# Patient Record
Sex: Female | Born: 1955
Health system: Southern US, Community
[De-identification: ages and names within clinical notes are randomized; demographics above are authoritative.]

## PROBLEM LIST (undated history)

## (undated) DIAGNOSIS — E559 Vitamin D deficiency, unspecified: Secondary | ICD-10-CM

## (undated) DIAGNOSIS — Z9359 Other cystostomy status: Secondary | ICD-10-CM

## (undated) DIAGNOSIS — S82209A Unspecified fracture of shaft of unspecified tibia, initial encounter for closed fracture: Secondary | ICD-10-CM

## (undated) DIAGNOSIS — N319 Neuromuscular dysfunction of bladder, unspecified: Secondary | ICD-10-CM

## (undated) DIAGNOSIS — H269 Unspecified cataract: Secondary | ICD-10-CM

## (undated) DIAGNOSIS — N6019 Diffuse cystic mastopathy of unspecified breast: Secondary | ICD-10-CM

## (undated) DIAGNOSIS — M810 Age-related osteoporosis without current pathological fracture: Secondary | ICD-10-CM

## (undated) DIAGNOSIS — E785 Hyperlipidemia, unspecified: Secondary | ICD-10-CM

## (undated) DIAGNOSIS — I82409 Acute embolism and thrombosis of unspecified deep veins of unspecified lower extremity: Secondary | ICD-10-CM

## (undated) DIAGNOSIS — S82409A Unspecified fracture of shaft of unspecified fibula, initial encounter for closed fracture: Secondary | ICD-10-CM

## (undated) DIAGNOSIS — G8250 Quadriplegia, unspecified: Secondary | ICD-10-CM

## (undated) DIAGNOSIS — L89159 Pressure ulcer of sacral region, unspecified stage: Secondary | ICD-10-CM

## (undated) DIAGNOSIS — Z8619 Personal history of other infectious and parasitic diseases: Secondary | ICD-10-CM

## (undated) DIAGNOSIS — L89509 Pressure ulcer of unspecified ankle, unspecified stage: Secondary | ICD-10-CM

## (undated) HISTORY — PX: EYE MUSCLE SURGERY: SHX370

## (undated) HISTORY — DX: Pressure ulcer of sacral region, unspecified stage: L89.159

## (undated) HISTORY — DX: Neuromuscular dysfunction of bladder, unspecified: N31.9

## (undated) HISTORY — DX: Quadriplegia, unspecified: G82.50

## (undated) HISTORY — DX: Personal history of other infectious and parasitic diseases: Z86.19

## (undated) HISTORY — DX: Unspecified fracture of shaft of unspecified fibula, initial encounter for closed fracture: S82.409A

## (undated) HISTORY — DX: Diffuse cystic mastopathy of unspecified breast: N60.19

## (undated) HISTORY — PX: DILATION AND CURETTAGE OF UTERUS: SHX78

## (undated) HISTORY — DX: Age-related osteoporosis without current pathological fracture: M81.0

## (undated) HISTORY — DX: Hyperlipidemia, unspecified: E78.5

## (undated) HISTORY — DX: Acute embolism and thrombosis of unspecified deep veins of unspecified lower extremity: I82.409

## (undated) HISTORY — DX: Unspecified fracture of shaft of unspecified tibia, initial encounter for closed fracture: S82.209A

## (undated) HISTORY — DX: Pressure ulcer of unspecified ankle, unspecified stage: L89.509

## (undated) HISTORY — DX: Unspecified cataract: H26.9

## (undated) HISTORY — DX: Vitamin D deficiency, unspecified: E55.9

---

## 1979-08-05 HISTORY — PX: MULTIPLE TOOTH EXTRACTIONS: SHX2053

## 1989-08-04 DIAGNOSIS — L89159 Pressure ulcer of sacral region, unspecified stage: Secondary | ICD-10-CM

## 1989-08-04 HISTORY — DX: Pressure ulcer of sacral region, unspecified stage: L89.159

## 1991-12-05 DIAGNOSIS — G8250 Quadriplegia, unspecified: Secondary | ICD-10-CM

## 1991-12-05 HISTORY — DX: Quadriplegia, unspecified: G82.50

## 1991-12-21 HISTORY — PX: POSTERIOR CERVICAL FUSION/FORAMINOTOMY: SHX5038

## 1992-12-04 DIAGNOSIS — I82409 Acute embolism and thrombosis of unspecified deep veins of unspecified lower extremity: Secondary | ICD-10-CM

## 1992-12-04 HISTORY — DX: Acute embolism and thrombosis of unspecified deep veins of unspecified lower extremity: I82.409

## 1995-12-05 DIAGNOSIS — N319 Neuromuscular dysfunction of bladder, unspecified: Secondary | ICD-10-CM

## 1995-12-05 HISTORY — DX: Neuromuscular dysfunction of bladder, unspecified: N31.9

## 2004-04-15 ENCOUNTER — Inpatient Hospital Stay (HOSPITAL_COMMUNITY): Admission: EM | Admit: 2004-04-15 | Discharge: 2004-04-18 | Payer: Self-pay | Admitting: Emergency Medicine

## 2006-04-25 ENCOUNTER — Inpatient Hospital Stay (HOSPITAL_COMMUNITY): Admission: EM | Admit: 2006-04-25 | Discharge: 2006-05-08 | Payer: Self-pay | Admitting: Emergency Medicine

## 2006-04-25 ENCOUNTER — Ambulatory Visit: Payer: Self-pay | Admitting: Internal Medicine

## 2006-04-29 ENCOUNTER — Encounter (INDEPENDENT_AMBULATORY_CARE_PROVIDER_SITE_OTHER): Payer: Self-pay | Admitting: *Deleted

## 2006-12-04 HISTORY — PX: DILITATION & CURRETTAGE/HYSTROSCOPY WITH NOVASURE ABLATION: SHX5568

## 2006-12-04 LAB — HM PAP SMEAR: HM Pap smear: NORMAL

## 2010-05-06 ENCOUNTER — Encounter (HOSPITAL_BASED_OUTPATIENT_CLINIC_OR_DEPARTMENT_OTHER): Admission: RE | Admit: 2010-05-06 | Discharge: 2010-08-04 | Payer: Self-pay | Admitting: Internal Medicine

## 2010-08-04 DIAGNOSIS — S82209A Unspecified fracture of shaft of unspecified tibia, initial encounter for closed fracture: Secondary | ICD-10-CM

## 2010-08-04 HISTORY — DX: Unspecified fracture of shaft of unspecified tibia, initial encounter for closed fracture: S82.209A

## 2010-08-04 HISTORY — PX: TIBIA FRACTURE SURGERY: SHX806

## 2010-08-09 ENCOUNTER — Encounter (HOSPITAL_BASED_OUTPATIENT_CLINIC_OR_DEPARTMENT_OTHER)
Admission: RE | Admit: 2010-08-09 | Discharge: 2010-11-07 | Payer: Self-pay | Source: Home / Self Care | Admitting: Internal Medicine

## 2010-08-29 ENCOUNTER — Emergency Department (HOSPITAL_COMMUNITY)
Admission: EM | Admit: 2010-08-29 | Discharge: 2010-08-29 | Payer: Self-pay | Source: Home / Self Care | Admitting: Emergency Medicine

## 2010-09-16 ENCOUNTER — Inpatient Hospital Stay (HOSPITAL_COMMUNITY): Admission: RE | Admit: 2010-09-16 | Discharge: 2010-09-18 | Payer: Self-pay | Admitting: Orthopedic Surgery

## 2010-11-10 ENCOUNTER — Encounter (HOSPITAL_BASED_OUTPATIENT_CLINIC_OR_DEPARTMENT_OTHER)
Admission: RE | Admit: 2010-11-10 | Discharge: 2010-12-07 | Payer: Self-pay | Source: Home / Self Care | Attending: Internal Medicine | Admitting: Internal Medicine

## 2010-12-04 LAB — HM DIABETES EYE EXAM

## 2010-12-08 ENCOUNTER — Encounter (HOSPITAL_BASED_OUTPATIENT_CLINIC_OR_DEPARTMENT_OTHER)
Admission: RE | Admit: 2010-12-08 | Discharge: 2011-01-03 | Payer: Self-pay | Source: Home / Self Care | Attending: Internal Medicine | Admitting: Internal Medicine

## 2010-12-08 ENCOUNTER — Emergency Department (HOSPITAL_COMMUNITY)
Admission: EM | Admit: 2010-12-08 | Discharge: 2010-12-08 | Payer: Self-pay | Source: Home / Self Care | Admitting: Emergency Medicine

## 2010-12-08 LAB — DIFFERENTIAL
Basophils Absolute: 0 10*3/uL (ref 0.0–0.1)
Basophils Relative: 1 % (ref 0–1)
Eosinophils Absolute: 0.1 10*3/uL (ref 0.0–0.7)
Eosinophils Relative: 1 % (ref 0–5)
Lymphocytes Relative: 45 % (ref 12–46)
Lymphs Abs: 2.6 10*3/uL (ref 0.7–4.0)
Monocytes Absolute: 0.6 10*3/uL (ref 0.1–1.0)
Monocytes Relative: 10 % (ref 3–12)
Neutro Abs: 2.5 10*3/uL (ref 1.7–7.7)
Neutrophils Relative %: 44 % (ref 43–77)

## 2010-12-08 LAB — PROTIME-INR
INR: 7.57 (ref 0.00–1.49)
Prothrombin Time: 63.6 seconds — ABNORMAL HIGH (ref 11.6–15.2)

## 2010-12-08 LAB — CBC
HCT: 38.8 % (ref 36.0–46.0)
Hemoglobin: 12.9 g/dL (ref 12.0–15.0)
MCH: 30.9 pg (ref 26.0–34.0)
MCHC: 33.2 g/dL (ref 30.0–36.0)
MCV: 93 fL (ref 78.0–100.0)
Platelets: 182 10*3/uL (ref 150–400)
RBC: 4.17 MIL/uL (ref 3.87–5.11)
RDW: 13.3 % (ref 11.5–15.5)
WBC: 5.8 10*3/uL (ref 4.0–10.5)

## 2011-01-04 DIAGNOSIS — L89509 Pressure ulcer of unspecified ankle, unspecified stage: Secondary | ICD-10-CM

## 2011-01-04 HISTORY — DX: Pressure ulcer of unspecified ankle, unspecified stage: L89.509

## 2011-01-05 ENCOUNTER — Encounter (HOSPITAL_BASED_OUTPATIENT_CLINIC_OR_DEPARTMENT_OTHER): Payer: Medicare Other | Attending: Internal Medicine

## 2011-01-05 DIAGNOSIS — L8993 Pressure ulcer of unspecified site, stage 3: Secondary | ICD-10-CM | POA: Insufficient documentation

## 2011-01-05 DIAGNOSIS — L02619 Cutaneous abscess of unspecified foot: Secondary | ICD-10-CM | POA: Insufficient documentation

## 2011-01-05 DIAGNOSIS — L89609 Pressure ulcer of unspecified heel, unspecified stage: Secondary | ICD-10-CM | POA: Insufficient documentation

## 2011-01-12 ENCOUNTER — Emergency Department (HOSPITAL_COMMUNITY): Payer: Medicare Other

## 2011-01-12 ENCOUNTER — Inpatient Hospital Stay (HOSPITAL_COMMUNITY)
Admission: EM | Admit: 2011-01-12 | Discharge: 2011-01-15 | DRG: 602 | Disposition: A | Discharge status: Home Health | Payer: Medicare Other | Attending: Family Medicine | Admitting: Family Medicine

## 2011-01-12 DIAGNOSIS — I82509 Chronic embolism and thrombosis of unspecified deep veins of unspecified lower extremity: Secondary | ICD-10-CM | POA: Diagnosis present

## 2011-01-12 DIAGNOSIS — L8994 Pressure ulcer of unspecified site, stage 4: Secondary | ICD-10-CM | POA: Diagnosis present

## 2011-01-12 DIAGNOSIS — G8220 Paraplegia, unspecified: Secondary | ICD-10-CM | POA: Diagnosis present

## 2011-01-12 DIAGNOSIS — L89609 Pressure ulcer of unspecified heel, unspecified stage: Secondary | ICD-10-CM | POA: Diagnosis present

## 2011-01-12 DIAGNOSIS — Z87440 Personal history of urinary (tract) infections: Secondary | ICD-10-CM

## 2011-01-12 DIAGNOSIS — R031 Nonspecific low blood-pressure reading: Secondary | ICD-10-CM | POA: Diagnosis present

## 2011-01-12 DIAGNOSIS — D638 Anemia in other chronic diseases classified elsewhere: Secondary | ICD-10-CM | POA: Diagnosis present

## 2011-01-12 DIAGNOSIS — N6019 Diffuse cystic mastopathy of unspecified breast: Secondary | ICD-10-CM | POA: Diagnosis present

## 2011-01-12 DIAGNOSIS — L02619 Cutaneous abscess of unspecified foot: Principal | ICD-10-CM | POA: Diagnosis present

## 2011-01-12 DIAGNOSIS — K5909 Other constipation: Secondary | ICD-10-CM | POA: Diagnosis present

## 2011-01-12 DIAGNOSIS — Z7901 Long term (current) use of anticoagulants: Secondary | ICD-10-CM

## 2011-01-12 DIAGNOSIS — Z9359 Other cystostomy status: Secondary | ICD-10-CM

## 2011-01-12 LAB — DIFFERENTIAL
Basophils Absolute: 0 10*3/uL (ref 0.0–0.1)
Basophils Relative: 0 % (ref 0–1)
Eosinophils Absolute: 0 10*3/uL (ref 0.0–0.7)
Eosinophils Relative: 0 % (ref 0–5)
Lymphocytes Relative: 23 % (ref 12–46)
Lymphs Abs: 2.2 10*3/uL (ref 0.7–4.0)
Monocytes Absolute: 1.1 10*3/uL — ABNORMAL HIGH (ref 0.1–1.0)
Monocytes Relative: 12 % (ref 3–12)
Neutro Abs: 6.2 10*3/uL (ref 1.7–7.7)
Neutrophils Relative %: 65 % (ref 43–77)

## 2011-01-12 LAB — BASIC METABOLIC PANEL
BUN: 9 mg/dL (ref 6–23)
CO2: 26 mEq/L (ref 19–32)
Calcium: 8.9 mg/dL (ref 8.4–10.5)
Chloride: 98 mEq/L (ref 96–112)
Creatinine, Ser: 0.4 mg/dL (ref 0.4–1.2)
GFR calc Af Amer: >60 mL/min (ref >60)
GFR calc non Af Amer: >60 mL/min (ref >60)
Glucose, Bld: 96 mg/dL (ref 70–99)
Potassium: 4 mEq/L (ref 3.5–5.1)
Sodium: 135 mEq/L (ref 135–145)

## 2011-01-12 LAB — CBC
HCT: 33.6 % — ABNORMAL LOW (ref 36.0–46.0)
Hemoglobin: 11.3 g/dL — ABNORMAL LOW (ref 12.0–15.0)
MCH: 30.6 pg (ref 26.0–34.0)
MCHC: 33.6 g/dL (ref 30.0–36.0)
MCV: 91.1 fL (ref 78.0–100.0)
Platelets: 184 10*3/uL (ref 150–400)
RBC: 3.69 MIL/uL — ABNORMAL LOW (ref 3.87–5.11)
RDW: 13.6 % (ref 11.5–15.5)
WBC: 9.6 10*3/uL (ref 4.0–10.5)

## 2011-01-12 LAB — URINALYSIS, ROUTINE W REFLEX MICROSCOPIC
Bilirubin Urine: NEGATIVE
Hgb urine dipstick: NEGATIVE
Ketones, ur: NEGATIVE mg/dL
Nitrite: NEGATIVE
Protein, ur: NEGATIVE mg/dL
Specific Gravity, Urine: 1.023 (ref 1.005–1.030)
Urine Glucose, Fasting: NEGATIVE mg/dL
Urobilinogen, UA: 1 mg/dL (ref 0.0–1.0)
pH: 6.5 (ref 5.0–8.0)

## 2011-01-12 LAB — PROTIME-INR
INR: 2.02 — ABNORMAL HIGH (ref 0.00–1.49)
Prothrombin Time: 23 seconds — ABNORMAL HIGH (ref 11.6–15.2)

## 2011-01-12 LAB — URINE MICROSCOPIC-ADD ON

## 2011-01-12 LAB — LACTIC ACID, PLASMA: Lactic Acid, Venous: 0.7 mmol/L (ref 0.5–2.2)

## 2011-01-13 LAB — PROTIME-INR
INR: 1.97 — ABNORMAL HIGH (ref 0.00–1.49)
Prothrombin Time: 22.6 seconds — ABNORMAL HIGH (ref 11.6–15.2)

## 2011-01-14 LAB — URINE CULTURE
Colony Count: NO GROWTH
Culture  Setup Time: 201202100330
Culture: NO GROWTH
Special Requests: NEGATIVE

## 2011-01-14 LAB — PROTIME-INR
INR: 1.68 — ABNORMAL HIGH (ref 0.00–1.49)
Prothrombin Time: 20 seconds — ABNORMAL HIGH (ref 11.6–15.2)

## 2011-01-14 LAB — CBC
HCT: 35.3 % — ABNORMAL LOW (ref 36.0–46.0)
MCHC: 32.3 g/dL (ref 30.0–36.0)
Platelets: 217 10*3/uL (ref 150–400)
RDW: 13.9 % (ref 11.5–15.5)
WBC: 5.7 10*3/uL (ref 4.0–10.5)

## 2011-01-14 LAB — VANCOMYCIN, TROUGH: Vancomycin Tr: 21.6 ug/mL — ABNORMAL HIGH (ref 10.0–20.0)

## 2011-01-15 LAB — CULTURE, BLOOD (ROUTINE X 2): Culture  Setup Time: 201202092152

## 2011-01-15 LAB — CBC
MCH: 30.5 pg (ref 26.0–34.0)
MCHC: 33.4 g/dL (ref 30.0–36.0)
Platelets: 192 10*3/uL (ref 150–400)
RDW: 13.8 % (ref 11.5–15.5)

## 2011-01-15 LAB — PROTIME-INR
INR: 1.89 — ABNORMAL HIGH (ref 0.00–1.49)
Prothrombin Time: 21.9 seconds — ABNORMAL HIGH (ref 11.6–15.2)
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
## 2011-01-16 LAB — WOUND CULTURE

## 2011-01-19 LAB — CULTURE, BLOOD (ROUTINE X 2): Culture  Setup Time: 201202092151
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
## 2011-02-02 ENCOUNTER — Encounter (HOSPITAL_BASED_OUTPATIENT_CLINIC_OR_DEPARTMENT_OTHER): Payer: Medicare Other | Attending: Internal Medicine

## 2011-02-02 DIAGNOSIS — L03119 Cellulitis of unspecified part of limb: Secondary | ICD-10-CM | POA: Insufficient documentation

## 2011-02-02 DIAGNOSIS — L02619 Cutaneous abscess of unspecified foot: Secondary | ICD-10-CM | POA: Insufficient documentation

## 2011-02-02 DIAGNOSIS — L8993 Pressure ulcer of unspecified site, stage 3: Secondary | ICD-10-CM | POA: Insufficient documentation

## 2011-02-02 DIAGNOSIS — L89609 Pressure ulcer of unspecified heel, unspecified stage: Secondary | ICD-10-CM | POA: Insufficient documentation

## 2011-02-15 LAB — BASIC METABOLIC PANEL
BUN: 4 mg/dL — ABNORMAL LOW (ref 6–23)
BUN: 5 mg/dL — ABNORMAL LOW (ref 6–23)
CO2: 26 mEq/L (ref 19–32)
Calcium: 7.9 mg/dL — ABNORMAL LOW (ref 8.4–10.5)
Chloride: 100 mEq/L (ref 96–112)
Creatinine, Ser: 0.32 mg/dL — ABNORMAL LOW (ref 0.4–1.2)
Creatinine, Ser: 0.45 mg/dL (ref 0.4–1.2)
GFR calc Af Amer: >60 mL/min (ref >60)
GFR calc non Af Amer: >60 mL/min (ref >60)
Glucose, Bld: 102 mg/dL — ABNORMAL HIGH (ref 70–99)

## 2011-02-15 LAB — CBC
MCH: 30.5 pg (ref 26.0–34.0)
MCHC: 32.6 g/dL (ref 30.0–36.0)
MCV: 93.8 fL (ref 78.0–100.0)
Platelets: 181 10*3/uL (ref 150–400)
Platelets: 222 10*3/uL (ref 150–400)
RBC: 3.25 MIL/uL — ABNORMAL LOW (ref 3.87–5.11)
RDW: 14.1 % (ref 11.5–15.5)
RDW: 14.2 % (ref 11.5–15.5)
WBC: 6.8 10*3/uL (ref 4.0–10.5)

## 2011-02-15 LAB — PROTIME-INR
INR: 1.89 — ABNORMAL HIGH (ref 0.00–1.49)
Prothrombin Time: 26.3 seconds — ABNORMAL HIGH (ref 11.6–15.2)

## 2011-02-16 LAB — CBC
HCT: 36.9 % (ref 36.0–46.0)
Hemoglobin: 12.4 g/dL (ref 12.0–15.0)
MCH: 31.3 pg (ref 26.0–34.0)
MCHC: 33.6 g/dL (ref 30.0–36.0)
MCV: 93.2 fL (ref 78.0–100.0)
RDW: 13.6 % (ref 11.5–15.5)

## 2011-02-16 LAB — PROTIME-INR
INR: 1.69 — ABNORMAL HIGH (ref 0.00–1.49)
INR: 2.18 — ABNORMAL HIGH (ref 0.00–1.49)

## 2011-02-16 LAB — BASIC METABOLIC PANEL
BUN: 9 mg/dL (ref 6–23)
CO2: 28 mEq/L (ref 19–32)
Chloride: 102 mEq/L (ref 96–112)
Glucose, Bld: 93 mg/dL (ref 70–99)
Potassium: 3.4 mEq/L — ABNORMAL LOW (ref 3.5–5.1)
Sodium: 139 mEq/L (ref 135–145)

## 2011-02-16 LAB — SURGICAL PCR SCREEN: Staphylococcus aureus: NEGATIVE

## 2011-03-09 ENCOUNTER — Encounter (HOSPITAL_BASED_OUTPATIENT_CLINIC_OR_DEPARTMENT_OTHER): Payer: Medicare Other | Attending: Internal Medicine

## 2011-03-09 DIAGNOSIS — L02619 Cutaneous abscess of unspecified foot: Secondary | ICD-10-CM | POA: Insufficient documentation

## 2011-03-09 DIAGNOSIS — L8993 Pressure ulcer of unspecified site, stage 3: Secondary | ICD-10-CM | POA: Insufficient documentation

## 2011-03-09 DIAGNOSIS — L03119 Cellulitis of unspecified part of limb: Secondary | ICD-10-CM | POA: Insufficient documentation

## 2011-03-09 DIAGNOSIS — L89609 Pressure ulcer of unspecified heel, unspecified stage: Secondary | ICD-10-CM | POA: Insufficient documentation

## 2011-04-06 ENCOUNTER — Encounter (HOSPITAL_BASED_OUTPATIENT_CLINIC_OR_DEPARTMENT_OTHER): Payer: Medicare Other | Attending: Internal Medicine

## 2011-04-06 DIAGNOSIS — L8992 Pressure ulcer of unspecified site, stage 2: Secondary | ICD-10-CM | POA: Insufficient documentation

## 2011-04-06 DIAGNOSIS — L89609 Pressure ulcer of unspecified heel, unspecified stage: Secondary | ICD-10-CM | POA: Insufficient documentation
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
## 2011-04-21 NOTE — Discharge Summary (Signed)
[name], [name]                           ACCOUNT NO.:  [id number]   MEDICAL RECORD NO.:  [id number]                   PATIENT TYPE:  INP   LOCATION:  0452                                 FACILITY:  [facility]   PHYSICIAN:  Renato Battles, M.D.                  DATE OF BIRTH:  January 01, 1956   DATE OF ADMISSION:  04/15/2004  DATE OF DISCHARGE:  04/18/2004                                 DISCHARGE SUMMARY   DISCHARGE DIAGNOSES:  1. Right lower lobe community-acquired pneumonia.  2. History of deep venous thrombosis without pulmonary embolus for which she     is on chronic anticoagulation with Coumadin.  3. Indwelling suprapubic catheter.  4. Elevated ALT with negative viral hepatitis.  5. Severe constipation.  6. Hypoalbuminemia.  7. Hypokalemia that resolved.   DISCHARGE MEDICATIONS:  The patient came into the hospital with a list of  medications that include 17 different drugs. She is to go home and resume  her home medications in addition to the following:  1. Augmentin 875 mg p.o. b.i.d. x10 days.  2. Robitussin LA 600 mg p.o. b.i.d. x10 days.  3. Resume Coumadin at 2 mg p.o. daily instead of the old dose.   PROCEDURE:  None.   CONSULTATIONS:  None.   HISTORY, PHYSICAL, AND HOSPITAL COURSE:  The patient is a very pleasant, 55-  year-old white female who is quadriplegic. She started to develop  progressive shortness of breath for the first half of this month which  preceded with a dry cough and feeling sick overall. Review of Systems  revealed decreased oral intake. Physical exam:  She had decreased breath  sounds and some right-sided crackles. She was afebrile. Chest x-ray revealed  right lower lobe pneumonia, otherwise her white count was elevated at 21.3.  Her liver functions showed elevated ALT at 53 and potassium was low at 3.1,  otherwise her studies were within normal limits, except for elevated INR at  22.2. The patient was admitted for treatment of community-acquired  pneumonia. She responded well to intravenous treatment with Rocephin and  azithromycin. Her low potassium corrected spontaneously. We had reversed the  coagulopathy with two units of FFP; however, the patient also received 10 mg  of vitamin K in the emergency room prior to my arrival.  INR came down to  normal at 1.2 and we had to restart the patient on Lovenox and reload her  with Coumadin. Her breathing improved significantly. She was able to clear  her chest with a few doses of Robitussin and some nebulizer treatments. Her  white count came down nicely to 14.2 on the day of discharge. Viral  hepatitis panel came back negative excluding hepatitis B and hepatitis C as  the reason for her elevated ALT which should be followed up as an  outpatient. We felt the patient was very stable to be discharged. Her  hospitalization was  uneventful  with no complications. UA was checked for any signs of UTI and  they came to be clear. Incidentally, we had not discussed her albumin level  which was low and the dietary counseling was performed.   FOLLOWUP:  The patient is to followup with her primary care physician, Dr.  Smith Mince as an outpatient.                                               Renato Battles, M.D.    SA/MEDQ  D:  04/18/2004  T:  04/19/2004  Job:  [id number]   cc:   Talmadge Coventry, M.D.  [address]  [place]  [place] [address]  Fax: [phone]
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
## 2011-04-21 NOTE — Op Note (Signed)
[name], [name]               ACCOUNT NO.:  [id number]   MEDICAL RECORD NO.:  [id number]          PATIENT TYPE:  INP   LOCATION:  5024                         FACILITY:  [facility]   PHYSICIAN:  Lebron Conners, M.D.   DATE OF BIRTH:  01/15/1956   DATE OF PROCEDURE:  05/03/2006  DATE OF DISCHARGE:                                 OPERATIVE REPORT   PREOPERATIVE DIAGNOSIS:  Pressure sore of the sacral area.   POSTOPERATIVE DIAGNOSIS:  Pressure sore of the sacral area.   OPERATION PERFORMED:  Debridement of pressure sore.   SURGEON:  Lebron Conners, M.D.   ANESTHESIA:  General.   COMPLICATIONS:  None.   ESTIMATED BLOOD LOSS:  Minimal.   CONDITION TO PACU:  Good.   SPECIMENS:  None.   DESCRIPTION OF PROCEDURE:  After the patient was monitored and had general  anesthesia, we turned her on her side with the left side down and I prepped  and draped the area of the pressure sore.  There was a little bit of dead  skin which I removed, cauterized.  There was rather extensive dead  subcutaneous tissue which removed as well.  I remove a little bit of  necrotic muscle fascia near the area of the coccyx and then I got good  hemostasis with cautery.  I packed the deepest areas of the wound with  Surgicel and then a bulky compressive most bandage.  She tolerated the  operation well.      Lebron Conners, M.D.  Electronically Signed     WB/MEDQ  D:  05/03/2006  T:  05/04/2006  Job:  [id number]

## 2011-05-11 ENCOUNTER — Encounter (HOSPITAL_BASED_OUTPATIENT_CLINIC_OR_DEPARTMENT_OTHER): Payer: Medicare Other | Attending: Internal Medicine

## 2011-05-11 DIAGNOSIS — L8992 Pressure ulcer of unspecified site, stage 2: Secondary | ICD-10-CM | POA: Insufficient documentation

## 2011-05-11 DIAGNOSIS — L89609 Pressure ulcer of unspecified heel, unspecified stage: Secondary | ICD-10-CM | POA: Insufficient documentation

## 2011-06-08 ENCOUNTER — Encounter (HOSPITAL_BASED_OUTPATIENT_CLINIC_OR_DEPARTMENT_OTHER): Payer: Medicare Other | Attending: Internal Medicine

## 2011-06-08 DIAGNOSIS — L89609 Pressure ulcer of unspecified heel, unspecified stage: Secondary | ICD-10-CM | POA: Insufficient documentation

## 2011-06-08 DIAGNOSIS — L8992 Pressure ulcer of unspecified site, stage 2: Secondary | ICD-10-CM | POA: Insufficient documentation

## 2011-07-06 ENCOUNTER — Encounter (HOSPITAL_BASED_OUTPATIENT_CLINIC_OR_DEPARTMENT_OTHER): Payer: Medicare Other | Attending: Internal Medicine

## 2011-07-06 DIAGNOSIS — L89609 Pressure ulcer of unspecified heel, unspecified stage: Secondary | ICD-10-CM | POA: Insufficient documentation

## 2011-07-06 DIAGNOSIS — L8992 Pressure ulcer of unspecified site, stage 2: Secondary | ICD-10-CM | POA: Insufficient documentation

## 2011-08-10 ENCOUNTER — Encounter (HOSPITAL_BASED_OUTPATIENT_CLINIC_OR_DEPARTMENT_OTHER): Payer: Medicare Other | Attending: Internal Medicine

## 2011-08-10 DIAGNOSIS — L89609 Pressure ulcer of unspecified heel, unspecified stage: Secondary | ICD-10-CM | POA: Insufficient documentation

## 2011-08-10 DIAGNOSIS — L8992 Pressure ulcer of unspecified site, stage 2: Secondary | ICD-10-CM | POA: Insufficient documentation

## 2011-09-07 ENCOUNTER — Encounter (HOSPITAL_BASED_OUTPATIENT_CLINIC_OR_DEPARTMENT_OTHER): Payer: Medicare Other | Attending: Internal Medicine

## 2011-09-07 DIAGNOSIS — L8992 Pressure ulcer of unspecified site, stage 2: Secondary | ICD-10-CM | POA: Insufficient documentation

## 2011-09-07 DIAGNOSIS — L89609 Pressure ulcer of unspecified heel, unspecified stage: Secondary | ICD-10-CM | POA: Insufficient documentation

## 2011-10-05 ENCOUNTER — Encounter (HOSPITAL_BASED_OUTPATIENT_CLINIC_OR_DEPARTMENT_OTHER): Payer: Medicare Other | Attending: Internal Medicine

## 2011-10-05 DIAGNOSIS — G8250 Quadriplegia, unspecified: Secondary | ICD-10-CM | POA: Insufficient documentation

## 2011-10-05 DIAGNOSIS — L8990 Pressure ulcer of unspecified site, unspecified stage: Secondary | ICD-10-CM | POA: Insufficient documentation

## 2011-10-05 DIAGNOSIS — Z79899 Other long term (current) drug therapy: Secondary | ICD-10-CM | POA: Insufficient documentation

## 2011-10-05 DIAGNOSIS — Z8614 Personal history of Methicillin resistant Staphylococcus aureus infection: Secondary | ICD-10-CM | POA: Insufficient documentation

## 2011-10-05 DIAGNOSIS — L89609 Pressure ulcer of unspecified heel, unspecified stage: Secondary | ICD-10-CM | POA: Insufficient documentation

## 2011-10-05 DIAGNOSIS — A4901 Methicillin susceptible Staphylococcus aureus infection, unspecified site: Secondary | ICD-10-CM | POA: Insufficient documentation

## 2011-10-05 DIAGNOSIS — Z86718 Personal history of other venous thrombosis and embolism: Secondary | ICD-10-CM | POA: Insufficient documentation

## 2011-10-05 DIAGNOSIS — E7800 Pure hypercholesterolemia, unspecified: Secondary | ICD-10-CM | POA: Insufficient documentation

## 2011-10-20 ENCOUNTER — Encounter: Payer: Medicare Other | Attending: Physical Medicine & Rehabilitation | Admitting: Physical Medicine & Rehabilitation

## 2011-10-20 DIAGNOSIS — G8250 Quadriplegia, unspecified: Secondary | ICD-10-CM | POA: Insufficient documentation

## 2011-10-20 DIAGNOSIS — K592 Neurogenic bowel, not elsewhere classified: Secondary | ICD-10-CM

## 2011-10-20 DIAGNOSIS — IMO0002 Reserved for concepts with insufficient information to code with codable children: Secondary | ICD-10-CM

## 2011-10-20 DIAGNOSIS — Z79899 Other long term (current) drug therapy: Secondary | ICD-10-CM | POA: Insufficient documentation

## 2011-10-20 DIAGNOSIS — Z7901 Long term (current) use of anticoagulants: Secondary | ICD-10-CM | POA: Insufficient documentation

## 2011-10-20 DIAGNOSIS — N319 Neuromuscular dysfunction of bladder, unspecified: Secondary | ICD-10-CM

## 2011-10-20 DIAGNOSIS — R259 Unspecified abnormal involuntary movements: Secondary | ICD-10-CM
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
## 2011-11-09 ENCOUNTER — Encounter (HOSPITAL_BASED_OUTPATIENT_CLINIC_OR_DEPARTMENT_OTHER): Payer: Medicare Other

## 2011-11-16 ENCOUNTER — Encounter (HOSPITAL_BASED_OUTPATIENT_CLINIC_OR_DEPARTMENT_OTHER): Payer: Medicare Other | Attending: Internal Medicine

## 2011-11-16 DIAGNOSIS — L89609 Pressure ulcer of unspecified heel, unspecified stage: Secondary | ICD-10-CM | POA: Insufficient documentation

## 2011-11-16 DIAGNOSIS — L8992 Pressure ulcer of unspecified site, stage 2: Secondary | ICD-10-CM | POA: Insufficient documentation

## 2011-11-20 ENCOUNTER — Ambulatory Visit: Payer: Medicare Other | Admitting: Physical Medicine & Rehabilitation

## 2011-12-01 LAB — HM DIABETES FOOT EXAM

## 2011-12-07 ENCOUNTER — Ambulatory Visit (INDEPENDENT_AMBULATORY_CARE_PROVIDER_SITE_OTHER): Payer: Medicare Other | Admitting: Internal Medicine

## 2011-12-07 ENCOUNTER — Other Ambulatory Visit (INDEPENDENT_AMBULATORY_CARE_PROVIDER_SITE_OTHER): Payer: Medicare Other

## 2011-12-07 ENCOUNTER — Encounter: Payer: Self-pay | Admitting: Internal Medicine

## 2011-12-07 DIAGNOSIS — G8250 Quadriplegia, unspecified: Secondary | ICD-10-CM

## 2011-12-07 DIAGNOSIS — Z7901 Long term (current) use of anticoagulants: Secondary | ICD-10-CM

## 2011-12-07 DIAGNOSIS — E785 Hyperlipidemia, unspecified: Secondary | ICD-10-CM

## 2011-12-07 DIAGNOSIS — Z5181 Encounter for therapeutic drug level monitoring: Secondary | ICD-10-CM

## 2011-12-07 DIAGNOSIS — L8990 Pressure ulcer of unspecified site, unspecified stage: Secondary | ICD-10-CM

## 2011-12-07 DIAGNOSIS — I82409 Acute embolism and thrombosis of unspecified deep veins of unspecified lower extremity: Secondary | ICD-10-CM | POA: Insufficient documentation

## 2011-12-07 DIAGNOSIS — L89509 Pressure ulcer of unspecified ankle, unspecified stage: Secondary | ICD-10-CM

## 2011-12-07 DIAGNOSIS — N319 Neuromuscular dysfunction of bladder, unspecified: Secondary | ICD-10-CM

## 2011-12-07 LAB — PROTIME-INR
INR: 4.3 ratio — ABNORMAL HIGH (ref 0.8–1.0)
Prothrombin Time: 47.7 s — ABNORMAL HIGH (ref 10.2–12.4)

## 2011-12-07 MED ORDER — WARFARIN SODIUM 2 MG PO TABS: ORAL_TABLET | ORAL | Status: DC

## 2011-12-07 MED ORDER — POTASSIUM CHLORIDE CRYS ER 20 MEQ PO TBCR: 20.0000 meq | EXTENDED_RELEASE_TABLET | Freq: Every day | ORAL | Status: DC

## 2011-12-07 NOTE — Patient Instructions (Addendum)
It was good to see you today. We have reviewed your prior records including labs and tests today Test(s) ordered today. Your results will be called to you after review (48-72hours after test completion). If any changes need to be made, you will be notified at that time. We will look into the home INR monitoring as discussed -  Medications reviewed, no changes at this time. Refill on potassium as discussed today. Please schedule followup in 6 months for cholesterol monitoring, call sooner if problems.

## 2011-12-07 NOTE — Assessment & Plan Note (Signed)
Ongoing since late 2011 related to poor fitting cast with ankle fracture in September 2011 Wound is not examined today. Continue dressing change and care as ongoing per Dr. Leanord Hawking at wound care clinic
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
## 2011-12-07 NOTE — Assessment & Plan Note (Signed)
On statin therapy for same. Recent dose increase to daily in November 2012 because of uncontrolled lipids Last labs reviewed. Continue same

## 2011-12-08 ENCOUNTER — Ambulatory Visit: Payer: Medicare Other | Admitting: Physical Medicine & Rehabilitation

## 2011-12-11 ENCOUNTER — Telehealth: Payer: Self-pay

## 2011-12-11 NOTE — Telephone Encounter (Signed)
Renee called requesting orders from MD stating that pt is now receiving care and for INR checks with cath changes. Pt is due for cath changes day so Luster Landsberg is requesting orders this afternoon.

## 2011-12-11 NOTE — Telephone Encounter (Signed)
Pt is performing her own at-home weekly INR check under [facility] services (see prior OV 12/07/11 re: same) - so Renee should change cath only - thanks

## 2011-12-12 NOTE — Telephone Encounter (Signed)
Per Luster Landsberg at [facility], pt's INR was being check every first and third Monday at the same time her catheter is changed. Renee will fax over orders stating VAL is assuming care of this patient as well as care plan. Fax number verified.

## 2011-12-14 ENCOUNTER — Other Ambulatory Visit (INDEPENDENT_AMBULATORY_CARE_PROVIDER_SITE_OTHER): Payer: Medicare Other

## 2011-12-14 ENCOUNTER — Encounter (HOSPITAL_BASED_OUTPATIENT_CLINIC_OR_DEPARTMENT_OTHER): Payer: Medicare Other | Attending: Internal Medicine

## 2011-12-14 ENCOUNTER — Telehealth: Payer: Self-pay | Admitting: *Deleted

## 2011-12-14 DIAGNOSIS — L8990 Pressure ulcer of unspecified site, unspecified stage: Secondary | ICD-10-CM | POA: Insufficient documentation

## 2011-12-14 DIAGNOSIS — Z79899 Other long term (current) drug therapy: Secondary | ICD-10-CM

## 2011-12-14 DIAGNOSIS — E7800 Pure hypercholesterolemia, unspecified: Secondary | ICD-10-CM | POA: Insufficient documentation

## 2011-12-14 DIAGNOSIS — Z86718 Personal history of other venous thrombosis and embolism: Secondary | ICD-10-CM | POA: Insufficient documentation

## 2011-12-14 DIAGNOSIS — I872 Venous insufficiency (chronic) (peripheral): Secondary | ICD-10-CM | POA: Insufficient documentation

## 2011-12-14 DIAGNOSIS — L89609 Pressure ulcer of unspecified heel, unspecified stage: Secondary | ICD-10-CM | POA: Insufficient documentation

## 2011-12-14 DIAGNOSIS — Z7901 Long term (current) use of anticoagulants: Secondary | ICD-10-CM

## 2011-12-14 DIAGNOSIS — Z8614 Personal history of Methicillin resistant Staphylococcus aureus infection: Secondary | ICD-10-CM | POA: Insufficient documentation

## 2011-12-14 NOTE — Telephone Encounter (Signed)
Yes, thanks - please call with results of INR

## 2011-12-14 NOTE — Telephone Encounter (Signed)
Want to inform md that they are still working with pt insurance to get INR monitor approve so she can be able to check at home. In the mean time does md want pt to have lab visit to check INR....12/14/11@9 :09am/LMB

## 2011-12-14 NOTE — Telephone Encounter (Signed)
Pt call and requesting to have PT check today since she is coming to [place]. Per md ok to have PT. Notified pt & Mandy with info.Marland KitchenMarland Kitchen1/10/13@9 :36am/LMB

## 2011-12-28 ENCOUNTER — Telehealth: Payer: Self-pay

## 2011-12-28 NOTE — Telephone Encounter (Signed)
Pt called to report her INR 2.0.

## 2011-12-28 NOTE — Telephone Encounter (Signed)
Good - no coumadin dose changes - thanks

## 2011-12-29 NOTE — Telephone Encounter (Signed)
Pt advised and states she will continue checking INR biweekly.

## 2012-01-04 ENCOUNTER — Other Ambulatory Visit: Payer: Self-pay | Admitting: *Deleted

## 2012-01-04 NOTE — Telephone Encounter (Signed)
Pt states pharmacy states they have sent request for refill on her Diazepam & Baclofen on Monday haven't receive response back form office. Inform pt we haven't received no request from pharmacy, but pt is wanting 31 days on meds. Is this ok?Marland Kitchen..01/04/12@3 :28pm/LMB

## 2012-01-05 MED ORDER — BACLOFEN 20 MG PO TABS: 20.0000 mg | ORAL_TABLET | Freq: Four times a day (QID) | ORAL | Status: DC

## 2012-01-05 MED ORDER — DIAZEPAM 5 MG PO TABS: 5.0000 mg | ORAL_TABLET | Freq: Two times a day (BID) | ORAL | Status: DC | PRN

## 2012-01-05 NOTE — Telephone Encounter (Signed)
Faxed script back to [facility]... 01/05/12@1 :21pm/LMB

## 2012-01-08 ENCOUNTER — Telehealth: Payer: Self-pay

## 2012-01-08 NOTE — Telephone Encounter (Signed)
RN called to report pt's INR at 2.3, pt is taking 3 mg of coumadin alternating with 2 mg. RN also called requesting MD okay to accept orders from pt's Urologist.

## 2012-01-08 NOTE — Telephone Encounter (Signed)
Ok with me -- but results should go to uro since uro is ordering tests - thanks

## 2012-01-08 NOTE — Telephone Encounter (Signed)
Urology order a UA and Ucx today and may give PRN orders but RN needs PCP okay to accept these from Urology.

## 2012-01-08 NOTE — Telephone Encounter (Signed)
Renee advised per MD authorization

## 2012-01-08 NOTE — Telephone Encounter (Signed)
Continue same coumadin - What uro orders?

## 2012-01-09 ENCOUNTER — Telehealth: Payer: Self-pay

## 2012-01-09 NOTE — Telephone Encounter (Signed)
Pt called requesting MD advisement on when she should repeat PT INR? Last reported 02/04 at 2.3

## 2012-01-09 NOTE — Telephone Encounter (Signed)
Pt prior readings are as follows. Pt advised to check every 2 weeks x 2 stable reading and to advise our office of any medication/procedure changes.   1/31 2.1 1/24 2.0  1/16 2.0 1/10 2.3 1/03 - Diflucan once monthly

## 2012-01-09 NOTE — Telephone Encounter (Signed)
i advise weekly INR until we have at least 4 stable readings, then every 2 weeks x 2 stable readings, then every 4 weeks (and as needed if med changes such as abx or other procedures) - thanks

## 2012-01-18 ENCOUNTER — Encounter (HOSPITAL_BASED_OUTPATIENT_CLINIC_OR_DEPARTMENT_OTHER): Payer: Medicare Other

## 2012-01-18 ENCOUNTER — Telehealth: Payer: Self-pay

## 2012-01-18 NOTE — Telephone Encounter (Signed)
Pt advised and agreed to check 02/15 and call with report

## 2012-01-18 NOTE — Telephone Encounter (Signed)
Pt called stating that her urologist Rx'd Cipro 200 mg bid x 14 days (started yesterday due to miscommunication between pharmacy and urology office). Pt is not due to check her INR until 02/18.

## 2012-01-18 NOTE — Telephone Encounter (Signed)
Noted - ok to check INR Fri 2/15 - thanks

## 2012-01-19 ENCOUNTER — Telehealth: Payer: Self-pay | Admitting: Internal Medicine

## 2012-01-19 NOTE — Telephone Encounter (Signed)
The pt called to report her pro-time was 2.2 today.  Thanks!

## 2012-01-22 NOTE — Telephone Encounter (Signed)
Noted - no change - thanks

## 2012-01-23 ENCOUNTER — Encounter: Payer: Self-pay | Admitting: Internal Medicine

## 2012-01-24 ENCOUNTER — Telehealth: Payer: Self-pay

## 2012-01-24 NOTE — Telephone Encounter (Signed)
[name] is requesting a verbal authorization to re-certify her services.

## 2012-01-24 NOTE — Telephone Encounter (Signed)
ok 

## 2012-01-24 NOTE — Telephone Encounter (Signed)
Notified Renee with md response,.... 01/24/12@5 :13pm/LMB

## 2012-01-25 ENCOUNTER — Telehealth: Payer: Self-pay

## 2012-01-25 NOTE — Telephone Encounter (Signed)
A user error has taken place: encounter opened in error, closed for administrative reasons.

## 2012-02-06 ENCOUNTER — Telehealth: Payer: Self-pay | Admitting: *Deleted

## 2012-02-06 NOTE — Telephone Encounter (Signed)
Pt informed of MD's advisement. 

## 2012-02-06 NOTE — Telephone Encounter (Signed)
Pt called to report her PT/INR was 1.8 on Monday.

## 2012-02-06 NOTE — Telephone Encounter (Signed)
Take warfarin 3mg  in place of next 2mg  dose day - then resume usual ongoing schedule - thanks

## 2012-02-08 ENCOUNTER — Encounter: Payer: Self-pay | Admitting: Internal Medicine

## 2012-02-21 ENCOUNTER — Telehealth: Payer: Self-pay

## 2012-02-21 LAB — PROTIME-INR

## 2012-02-21 NOTE — Telephone Encounter (Signed)
Take 3mg  daily for next 3 days, then resume 2mg  qod alt with 3 mg qod (as ongoing now) - thanks

## 2012-02-21 NOTE — Telephone Encounter (Signed)
Pt called to report her INR - 1.5

## 2012-02-22 NOTE — Telephone Encounter (Signed)
Pt advised and understood. Pt has appt scheduled with VAL 04/03 and is requesting lab orders to be done by her [facility] on the Monday before appt. Okay to generate order?

## 2012-02-22 NOTE — Telephone Encounter (Signed)
Ok thanks 

## 2012-02-26 NOTE — Telephone Encounter (Signed)
Pt advised that 6 mth f/u is not scheduled until July. Pt decided to call back closer to appt regarding labs.

## 2012-02-28 LAB — PROTIME-INR

## 2012-03-04 ENCOUNTER — Telehealth: Payer: Self-pay | Admitting: *Deleted

## 2012-03-04 NOTE — Telephone Encounter (Signed)
Increase dose warfarin 3 mg tabs 4 days a week - 3 mg on Sunday, Monday, Wednesday and Thursday -   continue to take 2 mg on all other days : Tuesday, Friday and Saturday

## 2012-03-04 NOTE — Telephone Encounter (Signed)
Left msg on triage INR 1.6... 03/04/12@4 :52pm/LMB

## 2012-03-05 NOTE — Telephone Encounter (Signed)
Notified pt with md recommendations, also mail copy to patient per her request... 03/05/12@9 :07am/LMB

## 2012-03-11 ENCOUNTER — Encounter: Payer: Self-pay | Admitting: Internal Medicine

## 2012-03-13 ENCOUNTER — Telehealth: Payer: Self-pay | Admitting: Internal Medicine

## 2012-03-13 NOTE — Telephone Encounter (Signed)
i called pt to review sub theraputic INRs - no longer on regular antibiotics - taking warfarin as rx'd - will increase to 3mg  daily now and consider need for 4mg  2x/week if still <2.0

## 2012-03-18 ENCOUNTER — Other Ambulatory Visit: Payer: Self-pay

## 2012-03-18 LAB — PROTIME-INR

## 2012-03-18 NOTE — Telephone Encounter (Signed)
Pt called to report her INR for today - 1.8 on 3 mg of coumadin daily.

## 2012-03-18 NOTE — Telephone Encounter (Signed)
Increase dose Coumadin to 4 mg twice a week on Tuesday and Friday, continue Coumadin 3 mg all other days of the week

## 2012-03-19 NOTE — Telephone Encounter (Signed)
Pt advised and will take 4 mg Tuesdays and Thursdays instead, MD aware. Rx also not needed at this time, pt states she has 4 mg tablets at home.

## 2012-03-25 ENCOUNTER — Telehealth: Payer: Self-pay | Admitting: *Deleted

## 2012-03-25 NOTE — Telephone Encounter (Signed)
Verbal ok. thanks

## 2012-03-25 NOTE — Telephone Encounter (Signed)
Cala Bradford at [facility] informed.

## 2012-03-25 NOTE — Telephone Encounter (Signed)
Caller requesting re-certification order for patient for the dates of 03/28/12 - 05/26/12: Every 1st & 3rd Monday S/P catheter [4] PRN pain visits [facility] C/P Status Can accept VO at [phone] or Fax Order to [phone]/SLS 08:45am

## 2012-03-26 ENCOUNTER — Encounter: Payer: Self-pay | Admitting: Internal Medicine

## 2012-04-01 ENCOUNTER — Telehealth: Payer: Self-pay

## 2012-04-01 DIAGNOSIS — L89609 Pressure ulcer of unspecified heel, unspecified stage: Secondary | ICD-10-CM

## 2012-04-01 DIAGNOSIS — N319 Neuromuscular dysfunction of bladder, unspecified: Secondary | ICD-10-CM

## 2012-04-01 DIAGNOSIS — Z7901 Long term (current) use of anticoagulants: Secondary | ICD-10-CM

## 2012-04-01 DIAGNOSIS — N390 Urinary tract infection, site not specified: Secondary | ICD-10-CM

## 2012-04-01 DIAGNOSIS — Z86718 Personal history of other venous thrombosis and embolism: Secondary | ICD-10-CM

## 2012-04-01 LAB — PROTIME-INR

## 2012-04-01 MED ORDER — OXYBUTYNIN CHLORIDE 5 MG PO TABS: 5.0000 mg | ORAL_TABLET | Freq: Two times a day (BID) | ORAL | Status: DC

## 2012-04-01 MED ORDER — BUMETANIDE 1 MG PO TABS: 1.0000 mg | ORAL_TABLET | Freq: Every day | ORAL | Status: DC

## 2012-04-01 NOTE — Telephone Encounter (Signed)
Pt advised and understood. 

## 2012-04-01 NOTE — Telephone Encounter (Signed)
Pt called to report her INR - 1.8  Pt is taking 3 mg everyday but Tuesday and Thursday when she takes 4 mg

## 2012-04-01 NOTE — Telephone Encounter (Signed)
Change to 4 mg daily

## 2012-04-08 ENCOUNTER — Telehealth: Payer: Self-pay

## 2012-04-08 NOTE — Telephone Encounter (Signed)
Pt aware.

## 2012-04-08 NOTE — Telephone Encounter (Signed)
Yea! The current medical regimen is effective;  continue present plan and medications.

## 2012-04-08 NOTE — Telephone Encounter (Signed)
Pt called to report INR - 2.3 on 4 mg coumadin daily.

## 2012-04-09 ENCOUNTER — Encounter: Payer: Self-pay | Admitting: Internal Medicine

## 2012-04-22 ENCOUNTER — Telehealth: Payer: Self-pay

## 2012-04-22 LAB — PROTIME-INR

## 2012-04-22 NOTE — Telephone Encounter (Signed)
Pt called to report her PT INR - 3.1 on 4 mg coumadin qd.

## 2012-04-22 NOTE — Telephone Encounter (Signed)
Pt advised.

## 2012-04-22 NOTE — Telephone Encounter (Signed)
Fine - continue same, no chane

## 2012-04-25 ENCOUNTER — Telehealth: Payer: Self-pay | Admitting: Internal Medicine

## 2012-04-25 NOTE — Telephone Encounter (Signed)
Please review note from 02/04. PRN orders already given by urologist, okay's by VAL and accepted by Renee RN at [facility]. Left message on machine for pt to return my call

## 2012-04-25 NOTE — Telephone Encounter (Signed)
Caller: Keileigh/patient; PCP: Rene Paci; CB#: [phone]; ; ; Call regarding Urinary symptoms.  States having some spasms, and has a bad odor to it.  States she is paralyzed from the neck down.  Urine is cloudy.  Afebrile.  Has had suprapubic catheter since 1996.  States the RN/Renee from [facility] will do a urine culture if she can have an order.  Also would like standing orders for UTI symptoms.   Per protocol, emergent symptoms denied; info to office for provider review/Rx/callback.   MAY CALL RN/[facility] OR REACH PATIENT [phone].

## 2012-04-26 NOTE — Telephone Encounter (Signed)
Noted - ok as here

## 2012-04-26 NOTE — Telephone Encounter (Signed)
Pt advised and states that she will contact her urologist Dr McDermitt.

## 2012-04-29 LAB — PROTIME-INR

## 2012-04-30 ENCOUNTER — Other Ambulatory Visit: Payer: Self-pay | Admitting: *Deleted

## 2012-04-30 DIAGNOSIS — E785 Hyperlipidemia, unspecified: Secondary | ICD-10-CM

## 2012-04-30 MED ORDER — PRAVASTATIN SODIUM 40 MG PO TABS: 40.0000 mg | ORAL_TABLET | Freq: Every day | ORAL | Status: DC

## 2012-04-30 MED ORDER — OLOPATADINE HCL 0.1 % OP SOLN: 1.0000 [drp] | Freq: Every day | OPHTHALMIC | Status: DC

## 2012-05-01 ENCOUNTER — Telehealth: Payer: Self-pay

## 2012-05-01 NOTE — Telephone Encounter (Signed)
Pt called stating she was started on an ABX for a UTI by her urologist Dr Jacquelyne Balint.  Pt was given Bactrim 160-800 mg BID x 7 days and will be starting this afternoon.

## 2012-05-01 NOTE — Telephone Encounter (Signed)
Noted - recommended reducing daily dose of warfarin to 3mg  daily while on Bactrim course as abx will increase level of INr due to drug interaction, then resue 4mg  dail after antibiotics complete - call if questions/problems - thanks

## 2012-05-02 NOTE — Telephone Encounter (Signed)
Pt advised of MD's recommendation and will call back with INR level on Monday June 3rd.

## 2012-05-02 NOTE — Telephone Encounter (Signed)
Left message on machine for pt to return my call  

## 2012-05-06 ENCOUNTER — Telehealth: Payer: Self-pay

## 2012-05-06 LAB — PROTIME-INR

## 2012-05-06 NOTE — Telephone Encounter (Signed)
Received written report from mdINR  - INR is 3.4 (May 06, 2012)  Please verify dose of current coumadin - ? 4 mg per day

## 2012-05-06 NOTE — Telephone Encounter (Signed)
Pt called to report PT INR of 3.1 on a reduce dose coumadin of 3 mg daily while on ABX -Bactrim. Please advise for this VAL pt, thanks!

## 2012-05-06 NOTE — Telephone Encounter (Signed)
No pt has only been taking 3 mg since she started abx. Will be completed with abx wed... 05/06/12@1 :22pm/LMB

## 2012-05-06 NOTE — Telephone Encounter (Signed)
Please be exact, does she actually take a 3 mg tab once per day, b/c the emr states 4 mg tab per day

## 2012-05-07 NOTE — Telephone Encounter (Signed)
Pt states she has been  is taking 3 mg since she been on the antibiotic last Thursday .. 05/07/12@8 :50am/LMB

## 2012-05-07 NOTE — Telephone Encounter (Signed)
Verified with lucy - pt has been taking 3 mg pill "leftover" from other rx in the past  OK to hold coumadin 3 mg for one day;  Re-start at  3 mg per day, then re-check INR 10 days (will need future lab)

## 2012-05-07 NOTE — Telephone Encounter (Signed)
Notified pt with md response... 05/07/12@1 :04pm/LMB

## 2012-05-07 NOTE — Telephone Encounter (Signed)
Again, the emr only lists a 4 mg tablet  How does pt have a 3 mg tab-  Could she be mistaken, or is this "leftover" med from before?

## 2012-05-13 ENCOUNTER — Telehealth: Payer: Self-pay

## 2012-05-13 NOTE — Telephone Encounter (Signed)
ok 

## 2012-05-13 NOTE — Telephone Encounter (Signed)
Pt called to report PT - 2.0 on 3 mg of coumadin. Pt states that she feels her levels are to high on 4 mg (3.1). Pt says that in the past her regimen of 4 mg, 4 mg and 3 mg on rotation worked best and she is requesting to have treatment changed to same. Please advise.

## 2012-05-14 ENCOUNTER — Encounter: Payer: Self-pay | Admitting: Internal Medicine

## 2012-05-14 MED ORDER — WARFARIN SODIUM 3 MG PO TABS: 3.0000 mg | ORAL_TABLET | Freq: Every day | ORAL | Status: DC

## 2012-05-14 MED ORDER — WARFARIN SODIUM 4 MG PO TABS: 4.0000 mg | ORAL_TABLET | Freq: Every day | ORAL | Status: DC

## 2012-05-14 NOTE — Telephone Encounter (Signed)
Pt advised and Rx sent to pharmacy 

## 2012-05-15 ENCOUNTER — Encounter: Payer: Self-pay | Admitting: Internal Medicine

## 2012-05-20 ENCOUNTER — Encounter: Payer: Self-pay | Admitting: Internal Medicine

## 2012-05-24 ENCOUNTER — Telehealth: Payer: Self-pay

## 2012-05-24 NOTE — Telephone Encounter (Signed)
Renee called requesting re certification orders for pt through 07/25/2012. Verbal authorization given per VAL protocol.

## 2012-05-27 ENCOUNTER — Telehealth: Payer: Self-pay

## 2012-05-27 LAB — PROTIME-INR

## 2012-05-27 NOTE — Telephone Encounter (Signed)
Good no changes

## 2012-05-27 NOTE — Telephone Encounter (Signed)
Pt called to report her INR - 2.8

## 2012-05-28 NOTE — Telephone Encounter (Signed)
Pt notified.Marland KitchenMarland Kitchen6/25/1323:20pm/LMB

## 2012-05-29 ENCOUNTER — Encounter: Payer: Self-pay | Admitting: Internal Medicine

## 2012-05-30 ENCOUNTER — Other Ambulatory Visit: Payer: Self-pay | Admitting: *Deleted

## 2012-05-30 MED ORDER — POTASSIUM CHLORIDE CRYS ER 20 MEQ PO TBCR: 20.0000 meq | EXTENDED_RELEASE_TABLET | Freq: Every day | ORAL | Status: DC

## 2012-05-30 MED ORDER — OXYBUTYNIN CHLORIDE 5 MG PO TABS: 5.0000 mg | ORAL_TABLET | Freq: Two times a day (BID) | ORAL | Status: DC

## 2012-06-05 ENCOUNTER — Ambulatory Visit (INDEPENDENT_AMBULATORY_CARE_PROVIDER_SITE_OTHER): Payer: Medicare Other | Admitting: Internal Medicine

## 2012-06-05 ENCOUNTER — Other Ambulatory Visit (INDEPENDENT_AMBULATORY_CARE_PROVIDER_SITE_OTHER): Payer: Medicare Other

## 2012-06-05 ENCOUNTER — Encounter: Payer: Self-pay | Admitting: Internal Medicine

## 2012-06-05 ENCOUNTER — Ambulatory Visit: Payer: Medicare Other | Admitting: Internal Medicine

## 2012-06-05 ENCOUNTER — Telehealth: Payer: Self-pay

## 2012-06-05 VITALS — BP 112/72 | HR 54 | Temp 97.2°F

## 2012-06-05 DIAGNOSIS — E559 Vitamin D deficiency, unspecified: Secondary | ICD-10-CM

## 2012-06-05 DIAGNOSIS — Z0000 Encounter for general adult medical examination without abnormal findings: Secondary | ICD-10-CM

## 2012-06-05 DIAGNOSIS — G8250 Quadriplegia, unspecified: Secondary | ICD-10-CM

## 2012-06-05 DIAGNOSIS — I82409 Acute embolism and thrombosis of unspecified deep veins of unspecified lower extremity: Secondary | ICD-10-CM

## 2012-06-05 DIAGNOSIS — N319 Neuromuscular dysfunction of bladder, unspecified: Secondary | ICD-10-CM

## 2012-06-05 DIAGNOSIS — M810 Age-related osteoporosis without current pathological fracture: Secondary | ICD-10-CM

## 2012-06-05 DIAGNOSIS — Z1211 Encounter for screening for malignant neoplasm of colon: Secondary | ICD-10-CM

## 2012-06-05 DIAGNOSIS — Z13220 Encounter for screening for lipoid disorders: Secondary | ICD-10-CM

## 2012-06-05 DIAGNOSIS — Z1159 Encounter for screening for other viral diseases: Secondary | ICD-10-CM

## 2012-06-05 DIAGNOSIS — Z1231 Encounter for screening mammogram for malignant neoplasm of breast: Secondary | ICD-10-CM

## 2012-06-05 DIAGNOSIS — Z7901 Long term (current) use of anticoagulants: Secondary | ICD-10-CM

## 2012-06-05 DIAGNOSIS — E785 Hyperlipidemia, unspecified: Secondary | ICD-10-CM

## 2012-06-05 DIAGNOSIS — Z79899 Other long term (current) drug therapy: Secondary | ICD-10-CM

## 2012-06-05 LAB — LIPID PANEL
HDL: 54.1 mg/dL (ref >39.00)
Triglycerides: 60 mg/dL (ref 0.0–149.0)

## 2012-06-05 MED ORDER — VITAMIN D 50 MCG (2000 UT) PO TABS: 2000.0000 [IU] | ORAL_TABLET | Freq: Every day | ORAL | Status: AC

## 2012-06-05 MED ORDER — CRANBERRY 400 MG PO CAPS: 4200.0000 mg | ORAL_CAPSULE | Freq: Every day | ORAL | Status: AC

## 2012-06-05 MED ORDER — CALCIUM POLYCARBOPHIL 625 MG PO TABS: 625.0000 mg | ORAL_TABLET | Freq: Four times a day (QID) | ORAL | Status: AC

## 2012-06-05 MED ORDER — SENNOSIDES 8.6 MG PO TABS: 1.0000 | ORAL_TABLET | Freq: Every day | ORAL | Status: DC

## 2012-06-05 MED ORDER — FEXOFENADINE HCL 180 MG PO TABS: 180.0000 mg | ORAL_TABLET | Freq: Every day | ORAL | Status: DC

## 2012-06-05 NOTE — Telephone Encounter (Signed)
[name] called to inform MD that agency is no longer contracted with her Insurance. Pt has been notified and requests new referral for home health services to [facility]
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
## 2012-06-05 NOTE — Patient Instructions (Signed)
It was good to see you today. Health Maintenance reviewed - refer for mammogram, colonoscopy and will consider shingles as able - all other recommended immunizations and age-appropriate screenings are up-to-date. Test(s) ordered today. Your results will be called to you after review (48-72hours after test completion). If any changes need to be made, you will be notified at that time. Medications reviewed, no changes at this time. Refill on medication(s) as discussed today. Will look for agency to help with your home health needs as reviewed today Please schedule followup in 6 months for review, call sooner if problems.

## 2012-06-05 NOTE — Telephone Encounter (Signed)
Ok done

## 2012-06-06 ENCOUNTER — Encounter: Payer: Self-pay | Admitting: Internal Medicine

## 2012-06-06 LAB — VITAMIN D 25 HYDROXY (VIT D DEFICIENCY, FRACTURES): Vit D, 25-Hydroxy: 49 ng/mL (ref 30–89)

## 2012-06-06 LAB — HEPATITIS C ANTIBODY: HCV Ab: NEGATIVE
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
## 2012-06-06 NOTE — Assessment & Plan Note (Signed)
On statin therapy for same. Recent dose increase to daily in November 2012 because of uncontrolled lipids Labs reviewed, check annually or as needed

## 2012-06-10 ENCOUNTER — Encounter: Payer: Self-pay | Admitting: Internal Medicine

## 2012-06-11 ENCOUNTER — Telehealth: Payer: Self-pay

## 2012-06-11 DIAGNOSIS — N319 Neuromuscular dysfunction of bladder, unspecified: Secondary | ICD-10-CM

## 2012-06-11 DIAGNOSIS — Z7901 Long term (current) use of anticoagulants: Secondary | ICD-10-CM

## 2012-06-11 DIAGNOSIS — Z86718 Personal history of other venous thrombosis and embolism: Secondary | ICD-10-CM

## 2012-06-11 DIAGNOSIS — N390 Urinary tract infection, site not specified: Secondary | ICD-10-CM

## 2012-06-11 DIAGNOSIS — L89609 Pressure ulcer of unspecified heel, unspecified stage: Secondary | ICD-10-CM

## 2012-06-11 NOTE — Telephone Encounter (Signed)
Cancel nursing Proceed with PT/OT

## 2012-06-11 NOTE — Telephone Encounter (Signed)
Message from Gavin Pound [facility] called requesting clarification on referral. When the pt was contacted she declined nursing stating that she already receives this service from another agency. [facility] is requesting MD approval to cancel nursing and proceed with PT and OT?

## 2012-06-17 ENCOUNTER — Encounter: Payer: Self-pay | Admitting: Internal Medicine

## 2012-06-17 LAB — PROTIME-INR

## 2012-06-26 ENCOUNTER — Encounter: Payer: Self-pay | Admitting: Internal Medicine

## 2012-07-01 ENCOUNTER — Other Ambulatory Visit: Payer: Self-pay | Admitting: *Deleted

## 2012-07-01 LAB — PROTIME-INR

## 2012-07-01 MED ORDER — BUMETANIDE 1 MG PO TABS: 1.0000 mg | ORAL_TABLET | Freq: Every day | ORAL | Status: DC

## 2012-07-01 MED ORDER — ALPRAZOLAM 0.5 MG PO TABS: 0.5000 mg | ORAL_TABLET | Freq: Every day | ORAL | Status: DC | PRN

## 2012-07-01 MED ORDER — BACLOFEN 20 MG PO TABS: 20.0000 mg | ORAL_TABLET | Freq: Four times a day (QID) | ORAL | Status: DC

## 2012-07-01 MED ORDER — ACETAMINOPHEN-CODEINE #3 300-30 MG PO TABS: 1.0000 | ORAL_TABLET | ORAL | Status: DC | PRN

## 2012-07-01 MED ORDER — DIAZEPAM 5 MG PO TABS: 5.0000 mg | ORAL_TABLET | Freq: Two times a day (BID) | ORAL | Status: DC | PRN

## 2012-07-02 ENCOUNTER — Other Ambulatory Visit: Payer: Self-pay

## 2012-07-02 NOTE — Telephone Encounter (Signed)
INR 1.7 done 07/29. Fax also sent and has placed on MD's desk for review.

## 2012-07-02 NOTE — Telephone Encounter (Signed)
Pt advised and understands new dosing.

## 2012-07-02 NOTE — Telephone Encounter (Signed)
Increase warfarin dose to 4-4-4-3, repeat.

## 2012-07-02 NOTE — Telephone Encounter (Signed)
Faxed scripts back to [facility]... 07/02/12@8 :43am/LMB

## 2012-07-05 ENCOUNTER — Ambulatory Visit: Payer: Medicare Other | Admitting: Internal Medicine

## 2012-07-09 ENCOUNTER — Telehealth: Payer: Self-pay | Admitting: Internal Medicine

## 2012-07-09 NOTE — Telephone Encounter (Signed)
A user error has taken place: encounter opened in error, closed for administrative reasons.

## 2012-07-12 ENCOUNTER — Encounter: Payer: Self-pay | Admitting: Internal Medicine

## 2012-07-15 ENCOUNTER — Telehealth: Payer: Self-pay | Admitting: Internal Medicine

## 2012-07-15 NOTE — Telephone Encounter (Signed)
Both issues noted- OV if bruise needs eval or tx - will review U when fax received - No tx changes recommended at this time

## 2012-07-16 ENCOUNTER — Other Ambulatory Visit: Payer: Self-pay

## 2012-07-16 MED ORDER — WARFARIN SODIUM 4 MG PO TABS: 4.0000 mg | ORAL_TABLET | ORAL | Status: DC

## 2012-07-16 MED ORDER — DIAZEPAM 5 MG PO TABS: 5.0000 mg | ORAL_TABLET | Freq: Two times a day (BID) | ORAL | Status: DC | PRN

## 2012-07-16 MED ORDER — WARFARIN SODIUM 3 MG PO TABS: 3.0000 mg | ORAL_TABLET | ORAL | Status: DC

## 2012-07-16 NOTE — Telephone Encounter (Signed)
Pt called stating her PT was 3.9 on Sunday 08/11. Pt did not take coumadin that night (4 mg) and rechecked Monday 3.0. Pt says she was advised to HOLD coumadin again Monday evening (4 mg) Pt is concerned that her levels are "out of wack".

## 2012-07-16 NOTE — Telephone Encounter (Signed)
Who is managing her coumadin? Doesn't seem to be me - I have not received ANY info that shows INR>2 in past 4 weeks  I need to know exactly: 1) her current dosing and 2) her last 3 INRs/date done before i can advise anything  Ok for valium refill

## 2012-07-16 NOTE — Telephone Encounter (Signed)
Reviewed - I had not seen 2 INRs for 07/2012 until now -  Recommend to resume prior 4-4-3 dose today (tues) -   Will hold empiric antibiotics for ?UTI until fax received re: sensitivities -

## 2012-07-16 NOTE — Telephone Encounter (Signed)
No answer, no VM

## 2012-07-16 NOTE — Telephone Encounter (Signed)
INR readings from 05/27 - 07/29 printed for MDs review. 08/05 INR 2.1. Per refill encounter 07/30 pt is repeating 4-4-4-3 mg.

## 2012-07-17 ENCOUNTER — Telehealth: Payer: Self-pay | Admitting: Internal Medicine

## 2012-07-17 NOTE — Telephone Encounter (Signed)
Have received UA results form hospice waiting for md to review. Will call back once md review... 07/17/12@3 :39pm/LMB

## 2012-07-17 NOTE — Telephone Encounter (Signed)
Pt advised of new dosing and expressed understanding. Pt also states that results will be faxed to MD's attention shortly.  Rx for Valium faxed to pharmacy

## 2012-07-17 NOTE — Telephone Encounter (Signed)
The pt called the triage line inquiring about a UA and culture results/forms?   She left her callback as [phone] Thanks!

## 2012-07-18 MED ORDER — AMPICILLIN 500 MG PO CAPS: 500.0000 mg | ORAL_CAPSULE | Freq: Four times a day (QID) | ORAL | Status: DC

## 2012-07-18 NOTE — Telephone Encounter (Signed)
Please call pharm and cancel ampicillin

## 2012-07-18 NOTE — Telephone Encounter (Signed)
Message left on [facility] VM to cancel ABX

## 2012-07-18 NOTE — Telephone Encounter (Signed)
Pt called stating she was Rx'd Keflex 500 mg TID x 7 days by her urologist.

## 2012-07-18 NOTE — Telephone Encounter (Signed)
Ampicillin antibiotics 4x/day x 10days - erx to [facility] done - thanks

## 2012-07-23 NOTE — Telephone Encounter (Signed)
Caller: [name]/Patient; PCP: Rene Paci; CB#: [phone]; Call regarding See Note; Caller states she was given Keflex by Dr. McDermot / Urology on 07/18/12.  Called and left message on 8/15 for Dr. Felicity Coyer regarding this and asking provider to give instructions related to antibiotics in regard to effect on Coumadin.   She took the Keflex, INR 2.3 on 07/22/12.  Now patient has learned that she has new Rx at pharmacy.   Reviewed EMR and informed patient that the most recent order indicates Ampicllin ordered today was cancelled as of 16:20 on 07/18/12  when message was left on voice mail for [facility] by Margaret Pyle, CMA.

## 2012-08-01 DIAGNOSIS — L89609 Pressure ulcer of unspecified heel, unspecified stage: Secondary | ICD-10-CM

## 2012-08-01 DIAGNOSIS — Z86718 Personal history of other venous thrombosis and embolism: Secondary | ICD-10-CM

## 2012-08-01 DIAGNOSIS — Z7901 Long term (current) use of anticoagulants: Secondary | ICD-10-CM

## 2012-08-01 DIAGNOSIS — N319 Neuromuscular dysfunction of bladder, unspecified: Secondary | ICD-10-CM

## 2012-08-01 DIAGNOSIS — N390 Urinary tract infection, site not specified: Secondary | ICD-10-CM

## 2012-08-06 ENCOUNTER — Encounter: Payer: Self-pay | Admitting: Internal Medicine

## 2012-08-06 ENCOUNTER — Ambulatory Visit: Payer: Medicare Other | Admitting: Internal Medicine

## 2012-08-06 ENCOUNTER — Ambulatory Visit (INDEPENDENT_AMBULATORY_CARE_PROVIDER_SITE_OTHER): Payer: Medicare Other | Admitting: Internal Medicine

## 2012-08-06 VITALS — BP 100/60 | HR 58

## 2012-08-06 DIAGNOSIS — Z1211 Encounter for screening for malignant neoplasm of colon: Secondary | ICD-10-CM

## 2012-08-06 DIAGNOSIS — Z7901 Long term (current) use of anticoagulants: Secondary | ICD-10-CM

## 2012-08-06 DIAGNOSIS — K592 Neurogenic bowel, not elsewhere classified: Secondary | ICD-10-CM

## 2012-08-06 NOTE — Patient Instructions (Addendum)
A colonoscopy is an appropriate screening test for colon cancer. The CPT code is [phone]  The screening code is v76.51  Since you have tetraplegia and neurogenic bowel, you would require observation admission to the hospital to prep and have the screening colonoscopy at [facility].  You would also need to stay off warfarin for 3-5 days before.  Please let me know if you want to proceed with this.  Thank you for choosing me and [facility].  Iva Boop, MD, Clement[facility] Graham
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
## 2012-08-16 ENCOUNTER — Encounter: Payer: Self-pay | Admitting: Internal Medicine

## 2012-08-19 ENCOUNTER — Telehealth: Payer: Self-pay | Admitting: *Deleted

## 2012-08-19 ENCOUNTER — Ambulatory Visit (INDEPENDENT_AMBULATORY_CARE_PROVIDER_SITE_OTHER): Payer: Medicare Other | Admitting: Internal Medicine

## 2012-08-19 NOTE — Patient Instructions (Signed)

## 2012-08-19 NOTE — Telephone Encounter (Signed)
Pt called to report INR-3.1-pt states it is gradually going up. Last Monday INR was 2.7, on 09/2 INR-2.5, and on 08/26 INR-2.6. Pt states that she has not taken any ABX since last week.

## 2012-08-19 NOTE — Telephone Encounter (Signed)
I will forward this message to Bailey Mech for anticoagulation management. Thanks so much

## 2012-08-26 ENCOUNTER — Encounter: Payer: Self-pay | Admitting: Internal Medicine

## 2012-08-27 ENCOUNTER — Ambulatory Visit (INDEPENDENT_AMBULATORY_CARE_PROVIDER_SITE_OTHER): Payer: Medicare Other | Admitting: General Practice

## 2012-08-28 ENCOUNTER — Telehealth: Payer: Self-pay | Admitting: Internal Medicine

## 2012-08-28 DIAGNOSIS — L89892 Pressure ulcer of other site, stage 2: Secondary | ICD-10-CM | POA: Insufficient documentation

## 2012-08-28 NOTE — Telephone Encounter (Signed)
Luster Landsberg with [facility] [phone] calling to update Dr.  Felicity Coyer on the patients stage 2 pressure ulcers on the top left & right foot.  States family has been putting triple antibiotic on it.  Also Renee needs to get a re-cert order to continue home health.  Please call Renee back.  Thanks

## 2012-08-28 NOTE — Telephone Encounter (Signed)
Renee notified

## 2012-08-28 NOTE — Telephone Encounter (Signed)
Noted - updated problem list - Ok to continue [facility] and include RN for wound care

## 2012-09-02 ENCOUNTER — Ambulatory Visit (INDEPENDENT_AMBULATORY_CARE_PROVIDER_SITE_OTHER): Payer: Medicare Other | Admitting: Internal Medicine

## 2012-09-02 DIAGNOSIS — I82409 Acute embolism and thrombosis of unspecified deep veins of unspecified lower extremity: Secondary | ICD-10-CM

## 2012-09-02 LAB — POCT INR
INR: 5.5
INR: 5.5

## 2012-09-06 DIAGNOSIS — G8253 Quadriplegia, C5-C7 complete: Secondary | ICD-10-CM

## 2012-09-06 DIAGNOSIS — Z7901 Long term (current) use of anticoagulants: Secondary | ICD-10-CM

## 2012-09-06 DIAGNOSIS — N319 Neuromuscular dysfunction of bladder, unspecified: Secondary | ICD-10-CM

## 2012-09-06 DIAGNOSIS — N390 Urinary tract infection, site not specified: Secondary | ICD-10-CM

## 2012-09-06 DIAGNOSIS — Z86718 Personal history of other venous thrombosis and embolism: Secondary | ICD-10-CM

## 2012-09-09 ENCOUNTER — Ambulatory Visit (INDEPENDENT_AMBULATORY_CARE_PROVIDER_SITE_OTHER): Payer: Medicare Other | Admitting: General Practice

## 2012-09-09 LAB — POCT INR: INR: 3

## 2012-09-16 ENCOUNTER — Ambulatory Visit (INDEPENDENT_AMBULATORY_CARE_PROVIDER_SITE_OTHER): Payer: Medicare Other | Admitting: General Practice

## 2012-09-16 LAB — POCT INR: INR: 2.6

## 2012-09-23 ENCOUNTER — Ambulatory Visit (INDEPENDENT_AMBULATORY_CARE_PROVIDER_SITE_OTHER): Payer: Medicare Other | Admitting: General Practice

## 2012-09-30 ENCOUNTER — Ambulatory Visit (INDEPENDENT_AMBULATORY_CARE_PROVIDER_SITE_OTHER): Payer: Medicare Other | Admitting: General Practice

## 2012-09-30 LAB — POCT INR: INR: 4.6

## 2012-10-02 ENCOUNTER — Telehealth: Payer: Self-pay

## 2012-10-02 DIAGNOSIS — Z23 Encounter for immunization: Secondary | ICD-10-CM

## 2012-10-02 MED ORDER — INFLUENZA VIRUS VACCINE SPLIT IM INJ: 0.5000 mL | INJECTION | Freq: Once | INTRAMUSCULAR | Status: DC

## 2012-10-02 NOTE — Telephone Encounter (Signed)
Pt called requesting an order to have [facility] administer flu shot in her thigh, please advise.

## 2012-10-02 NOTE — Telephone Encounter (Signed)
Rx generated and placed on MD's desk for signature. Rx to be faxed to [phone]

## 2012-10-02 NOTE — Telephone Encounter (Signed)
Verbal okay If written prescription needed, please generate and I will sign

## 2012-10-02 NOTE — Telephone Encounter (Signed)
Debbie from [facility] called stating she received the fax to administer a flus shot to the pt. States that they do not administer those shots.

## 2012-10-03 NOTE — Telephone Encounter (Signed)
Rx re faxed to [facility] [phone]

## 2012-10-07 ENCOUNTER — Ambulatory Visit (INDEPENDENT_AMBULATORY_CARE_PROVIDER_SITE_OTHER): Payer: Medicare Other | Admitting: General Practice

## 2012-10-11 ENCOUNTER — Encounter: Payer: Self-pay | Admitting: Internal Medicine

## 2012-10-14 ENCOUNTER — Ambulatory Visit (INDEPENDENT_AMBULATORY_CARE_PROVIDER_SITE_OTHER): Payer: Medicare Other | Admitting: General Practice

## 2012-10-21 ENCOUNTER — Ambulatory Visit (INDEPENDENT_AMBULATORY_CARE_PROVIDER_SITE_OTHER): Payer: Medicare Other | Admitting: General Practice

## 2012-10-21 LAB — POCT INR: INR: 2.1

## 2012-10-28 ENCOUNTER — Ambulatory Visit (INDEPENDENT_AMBULATORY_CARE_PROVIDER_SITE_OTHER): Payer: Medicare Other | Admitting: General Practice

## 2012-10-28 ENCOUNTER — Telehealth: Payer: Self-pay | Admitting: Internal Medicine

## 2012-10-28 NOTE — Telephone Encounter (Signed)
Caller: Renee Rn/[facility]/Other; Phone: [phone]; Reason for Call: Home Health Nurse calling to let Dr.  Felicity Coyer know that pt gets her supra pubic catheter changed every other Monday.  Nurse will not be available 11/04/12, so will need to do it on Tuesday 11/05/12 and this is fine with the pt.  Please call nurse back if Dr.  Henreitta Cea, otherwise Nurse will proceed with this plan.

## 2012-10-28 NOTE — Telephone Encounter (Signed)
Ok -i agree with order as requested- thanks

## 2012-10-29 ENCOUNTER — Other Ambulatory Visit: Payer: Self-pay | Admitting: *Deleted

## 2012-10-29 DIAGNOSIS — E785 Hyperlipidemia, unspecified: Secondary | ICD-10-CM

## 2012-10-29 MED ORDER — WARFARIN SODIUM 3 MG PO TABS: 3.0000 mg | ORAL_TABLET | ORAL | Status: DC

## 2012-10-29 MED ORDER — PRAVASTATIN SODIUM 40 MG PO TABS: 40.0000 mg | ORAL_TABLET | Freq: Every day | ORAL | Status: DC

## 2012-10-30 ENCOUNTER — Ambulatory Visit (INDEPENDENT_AMBULATORY_CARE_PROVIDER_SITE_OTHER): Payer: Medicare Other | Admitting: General Practice

## 2012-11-04 ENCOUNTER — Ambulatory Visit (INDEPENDENT_AMBULATORY_CARE_PROVIDER_SITE_OTHER): Payer: Medicare Other | Admitting: General Practice

## 2012-11-11 ENCOUNTER — Ambulatory Visit (INDEPENDENT_AMBULATORY_CARE_PROVIDER_SITE_OTHER): Payer: Medicare Other | Admitting: General Practice

## 2012-11-18 ENCOUNTER — Ambulatory Visit (INDEPENDENT_AMBULATORY_CARE_PROVIDER_SITE_OTHER): Payer: Medicare Other | Admitting: General Practice

## 2012-11-21 ENCOUNTER — Encounter: Payer: Self-pay | Admitting: Internal Medicine

## 2012-11-22 DIAGNOSIS — N390 Urinary tract infection, site not specified: Secondary | ICD-10-CM

## 2012-11-22 DIAGNOSIS — G8253 Quadriplegia, C5-C7 complete: Secondary | ICD-10-CM

## 2012-11-22 DIAGNOSIS — Z86718 Personal history of other venous thrombosis and embolism: Secondary | ICD-10-CM

## 2012-11-22 DIAGNOSIS — Z7901 Long term (current) use of anticoagulants: Secondary | ICD-10-CM

## 2012-11-22 DIAGNOSIS — N319 Neuromuscular dysfunction of bladder, unspecified: Secondary | ICD-10-CM

## 2012-11-25 ENCOUNTER — Telehealth: Payer: Self-pay | Admitting: General Practice

## 2012-11-25 NOTE — Telephone Encounter (Signed)
LMOM for patient to call coumadin clinic to get dosing instructions for INR.

## 2012-11-26 ENCOUNTER — Other Ambulatory Visit: Payer: Self-pay | Admitting: *Deleted

## 2012-11-26 MED ORDER — POTASSIUM CHLORIDE CRYS ER 20 MEQ PO TBCR: 20.0000 meq | EXTENDED_RELEASE_TABLET | Freq: Every day | ORAL | Status: DC

## 2012-11-26 MED ORDER — OXYBUTYNIN CHLORIDE 5 MG PO TABS: 5.0000 mg | ORAL_TABLET | Freq: Two times a day (BID) | ORAL | Status: DC

## 2012-12-02 ENCOUNTER — Ambulatory Visit (INDEPENDENT_AMBULATORY_CARE_PROVIDER_SITE_OTHER): Payer: Medicare Other | Admitting: General Practice

## 2012-12-02 LAB — PROTIME-INR

## 2012-12-05 ENCOUNTER — Telehealth: Payer: Self-pay | Admitting: *Deleted

## 2012-12-05 NOTE — Telephone Encounter (Signed)
Ok, will sign and return after reviewed -thanks

## 2012-12-05 NOTE — Telephone Encounter (Signed)
Want to inform md will be faxing over a order to have pt catheter change on Tues instead of Monday due to pt having appt with urologist on Monday's...Raechel Chute

## 2012-12-09 ENCOUNTER — Ambulatory Visit (INDEPENDENT_AMBULATORY_CARE_PROVIDER_SITE_OTHER): Payer: Medicare Other | Admitting: General Practice

## 2012-12-11 ENCOUNTER — Encounter: Payer: Self-pay | Admitting: Internal Medicine

## 2012-12-11 ENCOUNTER — Ambulatory Visit (INDEPENDENT_AMBULATORY_CARE_PROVIDER_SITE_OTHER): Payer: Medicare Other | Admitting: Internal Medicine

## 2012-12-11 VITALS — BP 118/72 | HR 52 | Temp 97.6°F

## 2012-12-11 DIAGNOSIS — Z1239 Encounter for other screening for malignant neoplasm of breast: Secondary | ICD-10-CM

## 2012-12-11 DIAGNOSIS — E785 Hyperlipidemia, unspecified: Secondary | ICD-10-CM

## 2012-12-11 DIAGNOSIS — Z2911 Encounter for prophylactic immunotherapy for respiratory syncytial virus (RSV): Secondary | ICD-10-CM

## 2012-12-11 DIAGNOSIS — Z23 Encounter for immunization: Secondary | ICD-10-CM

## 2012-12-11 DIAGNOSIS — G8250 Quadriplegia, unspecified: Secondary | ICD-10-CM

## 2012-12-11 DIAGNOSIS — M810 Age-related osteoporosis without current pathological fracture: Secondary | ICD-10-CM

## 2012-12-11 DIAGNOSIS — I82409 Acute embolism and thrombosis of unspecified deep veins of unspecified lower extremity: Secondary | ICD-10-CM

## 2012-12-11 NOTE — Patient Instructions (Addendum)
It was good to see you today. We have reviewed your prior records including labs and tests today continue home INR monitoring as ongoing  Medications reviewed, no changes at this time. we'll make referral for screening mammogram. Our office will contact you regarding appointment(s) once made. Please schedule followup in 8-9 months for cholesterol monitoring, call sooner if problems. Shingles vaccine today

## 2012-12-11 NOTE — Assessment & Plan Note (Signed)
On statin therapy for same.  dose increase November 2012 because of uncontrolled lipids Labs reviewed, check annually or as needed 
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
## 2012-12-16 ENCOUNTER — Ambulatory Visit (INDEPENDENT_AMBULATORY_CARE_PROVIDER_SITE_OTHER): Payer: Medicare Other | Admitting: General Practice

## 2012-12-16 LAB — POCT INR: INR: 2.1

## 2012-12-18 ENCOUNTER — Encounter: Payer: Self-pay | Admitting: Internal Medicine

## 2012-12-23 ENCOUNTER — Ambulatory Visit (INDEPENDENT_AMBULATORY_CARE_PROVIDER_SITE_OTHER): Payer: Medicare Other | Admitting: General Practice

## 2012-12-23 LAB — POCT INR: INR: 4.4

## 2012-12-30 ENCOUNTER — Ambulatory Visit (INDEPENDENT_AMBULATORY_CARE_PROVIDER_SITE_OTHER): Payer: Medicare Other | Admitting: General Practice

## 2012-12-30 ENCOUNTER — Telehealth: Payer: Self-pay | Admitting: *Deleted

## 2012-12-30 NOTE — Telephone Encounter (Signed)
Left msg on vm needing to get verbal re-cert orders to change catheter every other Monday starting 12/31/12-02/28/13...Raechel Chute

## 2012-12-30 NOTE — Telephone Encounter (Signed)
ok 

## 2012-12-30 NOTE — Telephone Encounter (Signed)
Notified Renee with md response...Raechel Chute

## 2013-01-01 ENCOUNTER — Ambulatory Visit (INDEPENDENT_AMBULATORY_CARE_PROVIDER_SITE_OTHER): Payer: Medicare Other | Admitting: General Practice

## 2013-01-01 ENCOUNTER — Other Ambulatory Visit: Payer: Self-pay | Admitting: *Deleted

## 2013-01-01 DIAGNOSIS — I82409 Acute embolism and thrombosis of unspecified deep veins of unspecified lower extremity: Secondary | ICD-10-CM

## 2013-01-01 MED ORDER — BACLOFEN 20 MG PO TABS: 20.0000 mg | ORAL_TABLET | Freq: Four times a day (QID) | ORAL | Status: DC

## 2013-01-01 MED ORDER — WARFARIN SODIUM 3 MG PO TABS: 3.0000 mg | ORAL_TABLET | ORAL | Status: DC

## 2013-01-01 MED ORDER — BUMETANIDE 1 MG PO TABS: 1.0000 mg | ORAL_TABLET | Freq: Every day | ORAL | Status: DC

## 2013-01-01 MED ORDER — DIAZEPAM 5 MG PO TABS: 5.0000 mg | ORAL_TABLET | Freq: Two times a day (BID) | ORAL | Status: DC | PRN

## 2013-01-01 NOTE — Telephone Encounter (Signed)
Faxed script for diazepam back to [facility]...[name]

## 2013-01-03 ENCOUNTER — Other Ambulatory Visit: Payer: Self-pay | Admitting: Internal Medicine

## 2013-01-03 MED ORDER — DIAZEPAM 5 MG PO TABS: 5.0000 mg | ORAL_TABLET | Freq: Two times a day (BID) | ORAL | Status: DC | PRN

## 2013-01-03 NOTE — Telephone Encounter (Signed)
Please read phone note below.  

## 2013-01-03 NOTE — Telephone Encounter (Signed)
Patient left message on triage stating her pharmacy did not received the fax for the diazepam, she is requesting we send it over again to [facility] before 12 today so that it can be delivered to her

## 2013-01-03 NOTE — Telephone Encounter (Signed)
Just saw this (it is after 12:00). Ok to [employer] - a call in to pharmacy should do. Thanks and have a great day

## 2013-01-07 ENCOUNTER — Ambulatory Visit (INDEPENDENT_AMBULATORY_CARE_PROVIDER_SITE_OTHER): Payer: Medicare Other | Admitting: General Practice

## 2013-01-13 ENCOUNTER — Ambulatory Visit (INDEPENDENT_AMBULATORY_CARE_PROVIDER_SITE_OTHER): Payer: Medicare Other | Admitting: General Practice

## 2013-01-13 ENCOUNTER — Telehealth: Payer: Self-pay | Admitting: Internal Medicine

## 2013-01-13 LAB — POCT INR: INR: 4.5

## 2013-01-13 NOTE — Telephone Encounter (Signed)
Will forward to Cindy

## 2013-01-13 NOTE — Telephone Encounter (Signed)
[name] from [facility] INR is giving a call in INR report, the patients INR was 4.5 today, if you have questions call [phone]

## 2013-01-20 ENCOUNTER — Ambulatory Visit (INDEPENDENT_AMBULATORY_CARE_PROVIDER_SITE_OTHER): Payer: Medicare Other | Admitting: General Practice

## 2013-01-23 ENCOUNTER — Telehealth: Payer: Self-pay | Admitting: Internal Medicine

## 2013-01-23 NOTE — Telephone Encounter (Signed)
Dr. Leone Payor patient has decided she wants to proceed with colonoscopy.  Do you want me to schedule for your next hospital week and admission for prep?

## 2013-01-23 NOTE — Telephone Encounter (Signed)
Patient is requesting a return call to discuss some concerns

## 2013-01-24 ENCOUNTER — Telehealth: Payer: Self-pay | Admitting: Internal Medicine

## 2013-01-24 MED ORDER — AMPICILLIN 500 MG PO CAPS: 500.0000 mg | ORAL_CAPSULE | Freq: Three times a day (TID) | ORAL | Status: DC

## 2013-01-24 NOTE — Telephone Encounter (Signed)
See previous phone note concerning UTI...lmb

## 2013-01-24 NOTE — Telephone Encounter (Signed)
Faxed copy of Ucx 01/20/13: Proteus mirabilis, resistant to TMP/SMX and nitrofur  Please call pt- if having symptoms of UTI and not already on antibiotics, send Cipro 500 mg bid x 7 d (OR ampicillin 500mg  tid x 7 days if pt prefers)  thanks

## 2013-01-24 NOTE — Telephone Encounter (Signed)
Notified pt with md response. Pt states yes she has been having sxs was rx macrobid by her urologist. Inform pt md want her to take antibiotic that is listed below. Pt states ok but which one would agree with her coumadin. Inform pt which ever md choose we will send to [facility]...Raechel Chute

## 2013-01-24 NOTE — Telephone Encounter (Signed)
Will stop Macrobid because of antibiotics resistance to this antibiotic Use ampicillin for less interaction with Coumadin -ex done

## 2013-01-27 ENCOUNTER — Ambulatory Visit (INDEPENDENT_AMBULATORY_CARE_PROVIDER_SITE_OTHER): Payer: Medicare Other | Admitting: General Practice

## 2013-01-28 ENCOUNTER — Other Ambulatory Visit: Payer: Self-pay | Admitting: *Deleted

## 2013-01-28 MED ORDER — WARFARIN SODIUM 4 MG PO TABS: 4.0000 mg | ORAL_TABLET | Freq: Every day | ORAL | Status: DC

## 2013-01-31 NOTE — Telephone Encounter (Signed)
Not sure we could get a MAC slot for her then and she will need anti-coag clearrance  Let me see her in office in March (later in month) and can try to work out for hospital time late in Spring

## 2013-02-03 ENCOUNTER — Ambulatory Visit (INDEPENDENT_AMBULATORY_CARE_PROVIDER_SITE_OTHER): Payer: Medicare Other | Admitting: General Practice

## 2013-02-03 DIAGNOSIS — I82409 Acute embolism and thrombosis of unspecified deep veins of unspecified lower extremity: Secondary | ICD-10-CM

## 2013-02-03 LAB — POCT INR: INR: 2.5

## 2013-02-03 NOTE — Telephone Encounter (Signed)
Patient is scheduled for 03/04/13 3:45

## 2013-02-10 ENCOUNTER — Ambulatory Visit (INDEPENDENT_AMBULATORY_CARE_PROVIDER_SITE_OTHER): Payer: Medicare Other | Admitting: General Practice

## 2013-02-10 LAB — POCT INR: INR: 5

## 2013-02-11 ENCOUNTER — Other Ambulatory Visit (HOSPITAL_COMMUNITY): Payer: Self-pay | Admitting: Urology

## 2013-02-11 DIAGNOSIS — N319 Neuromuscular dysfunction of bladder, unspecified: Secondary | ICD-10-CM

## 2013-02-12 ENCOUNTER — Telehealth: Payer: Self-pay | Admitting: Internal Medicine

## 2013-02-12 DIAGNOSIS — N319 Neuromuscular dysfunction of bladder, unspecified: Secondary | ICD-10-CM
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
## 2013-02-12 NOTE — Addendum Note (Signed)
Addended by: Rene Paci A on: 02/12/2013 05:56 PM   Modules accepted: Orders

## 2013-02-12 NOTE — Telephone Encounter (Signed)
In response to the below listed 5 questions: 1 -I agree with not giving antibiotics for any culture growth unless patient has symptoms of UTI/illness. This is because patient is colonized with bacteria due to having an indwelling catheter. There will always be growth on any urine culture checked regardless if she is sick or NOT sick. 2 -I will refer to Dr. Earlene Plater at [facility] as requested [facility] will call regarding same 3 -I do not see reason for CT of the bladder 4 -okay for home health verbal orders to increase frequency of cath flush and exchange 5 - I am unaware of periodic bladder irrigation or "flushing" showing any benefit and therefore I do not recommend same.  Thanks thanks

## 2013-02-13 ENCOUNTER — Ambulatory Visit (INDEPENDENT_AMBULATORY_CARE_PROVIDER_SITE_OTHER): Payer: Medicare Other | Admitting: General Practice

## 2013-02-13 NOTE — Telephone Encounter (Signed)
Left message on machine for pt to return my call  

## 2013-02-18 ENCOUNTER — Telehealth: Payer: Self-pay | Admitting: Internal Medicine
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
## 2013-02-18 NOTE — Telephone Encounter (Signed)
1) does this patient need albuterol MDI or neb? If she has nebulizer machine, okay to send either form of the medication at patient preference 2) I do not feel patient needs antibiotics based on current symptoms and report. She should work with her urologist for possible bladder infection symptoms as ongoing

## 2013-02-19 MED ORDER — ALBUTEROL SULFATE HFA 108 (90 BASE) MCG/ACT IN AERS: 2.0000 | INHALATION_SPRAY | Freq: Four times a day (QID) | RESPIRATORY_TRACT | Status: DC | PRN

## 2013-02-19 NOTE — Telephone Encounter (Signed)
Pt advised in detail and expressed understanding 

## 2013-02-24 ENCOUNTER — Ambulatory Visit (INDEPENDENT_AMBULATORY_CARE_PROVIDER_SITE_OTHER): Payer: Medicare Other | Admitting: General Practice

## 2013-02-24 DIAGNOSIS — I82409 Acute embolism and thrombosis of unspecified deep veins of unspecified lower extremity: Secondary | ICD-10-CM

## 2013-02-28 ENCOUNTER — Telehealth: Payer: Self-pay | Admitting: Internal Medicine

## 2013-02-28 NOTE — Telephone Encounter (Signed)
Pt has 3 sores back of legs.  Family has been cleaning them with soap and water and saline.  The home health nurse is sending a request for them to clean the wounds.  No swelling or redness at the wound sites.

## 2013-02-28 NOTE — Telephone Encounter (Signed)
Verbal ok for [facility] wound care - tx prn

## 2013-03-03 ENCOUNTER — Ambulatory Visit (HOSPITAL_COMMUNITY): Payer: Medicare Other

## 2013-03-03 ENCOUNTER — Encounter: Payer: Self-pay | Admitting: Internal Medicine

## 2013-03-03 LAB — PROTIME-INR

## 2013-03-03 NOTE — Telephone Encounter (Signed)
Notified HH with md response...Raechel Chute

## 2013-03-04 ENCOUNTER — Ambulatory Visit (INDEPENDENT_AMBULATORY_CARE_PROVIDER_SITE_OTHER): Payer: Medicare Other | Admitting: General Practice

## 2013-03-04 ENCOUNTER — Ambulatory Visit: Payer: Medicare Other | Admitting: Internal Medicine

## 2013-03-04 LAB — POCT INR: INR: 2.8

## 2013-03-06 ENCOUNTER — Telehealth: Payer: Self-pay | Admitting: Internal Medicine

## 2013-03-06 NOTE — Telephone Encounter (Signed)
ok 

## 2013-03-06 NOTE — Telephone Encounter (Signed)
Notified renee with md response...lmb 

## 2013-03-06 NOTE — Telephone Encounter (Signed)
Renee from [facility] is calling to get verbal order to change the patients cath tomorrow instead of next Tuesday, patient is concerned it will not last that long, she has an appointment with new Urologist on Monday, call Renee with verbal at [phone]

## 2013-03-10 ENCOUNTER — Ambulatory Visit (INDEPENDENT_AMBULATORY_CARE_PROVIDER_SITE_OTHER): Payer: Medicare Other | Admitting: General Practice

## 2013-03-10 DIAGNOSIS — N3020 Other chronic cystitis without hematuria: Secondary | ICD-10-CM | POA: Insufficient documentation

## 2013-03-17 ENCOUNTER — Ambulatory Visit (INDEPENDENT_AMBULATORY_CARE_PROVIDER_SITE_OTHER): Payer: Medicare Other | Admitting: General Practice

## 2013-03-17 ENCOUNTER — Encounter: Payer: Self-pay | Admitting: Internal Medicine

## 2013-03-24 ENCOUNTER — Telehealth: Payer: Self-pay | Admitting: *Deleted

## 2013-03-24 ENCOUNTER — Ambulatory Visit (INDEPENDENT_AMBULATORY_CARE_PROVIDER_SITE_OTHER): Payer: Medicare Other | Admitting: General Practice

## 2013-03-24 ENCOUNTER — Encounter: Payer: Self-pay | Admitting: Internal Medicine

## 2013-03-24 LAB — POCT INR: INR: 2.4

## 2013-03-24 NOTE — Telephone Encounter (Signed)
Left msg on vm stating pt wound on her left heel has some drainage- moderate. There is surrounding redness but no warmth. (R) heel is stable s-scar no drainage. Will give another week if still have drainage will f/u with wound clinic...lmb

## 2013-03-31 ENCOUNTER — Ambulatory Visit (INDEPENDENT_AMBULATORY_CARE_PROVIDER_SITE_OTHER): Payer: Medicare Other | Admitting: General Practice

## 2013-03-31 LAB — POCT INR: INR: 2.4

## 2013-04-07 ENCOUNTER — Ambulatory Visit (INDEPENDENT_AMBULATORY_CARE_PROVIDER_SITE_OTHER): Payer: Medicare Other | Admitting: General Practice

## 2013-04-07 ENCOUNTER — Telehealth: Payer: Self-pay | Admitting: *Deleted

## 2013-04-07 DIAGNOSIS — L608 Other nail disorders: Secondary | ICD-10-CM

## 2013-04-07 NOTE — Telephone Encounter (Signed)
Left msg on vm stating pt c/o of on her (R) foot little toe next to the baby toe the mail is separating from the nail bed. Does not see anything bruising, discloration. Same thing is happening to the same toe on her left foot....[name]

## 2013-04-07 NOTE — Telephone Encounter (Signed)
Refer to podiatry done

## 2013-04-08 NOTE — Telephone Encounter (Signed)
Notified renee with md response...lmb

## 2013-04-14 ENCOUNTER — Ambulatory Visit (INDEPENDENT_AMBULATORY_CARE_PROVIDER_SITE_OTHER): Payer: Self-pay | Admitting: General Practice

## 2013-04-14 ENCOUNTER — Ambulatory Visit: Payer: Medicare Other | Admitting: Internal Medicine

## 2013-04-14 ENCOUNTER — Telehealth: Payer: Self-pay | Admitting: Internal Medicine

## 2013-04-14 DIAGNOSIS — Z7901 Long term (current) use of anticoagulants: Secondary | ICD-10-CM

## 2013-04-14 NOTE — Telephone Encounter (Signed)
No charge. 

## 2013-04-14 NOTE — Telephone Encounter (Signed)
Let me know about charging today Sir, thank you.

## 2013-04-14 NOTE — Telephone Encounter (Signed)
No Charge 

## 2013-04-21 ENCOUNTER — Ambulatory Visit (INDEPENDENT_AMBULATORY_CARE_PROVIDER_SITE_OTHER): Payer: Medicare Other | Admitting: General Practice

## 2013-04-25 ENCOUNTER — Telehealth: Payer: Self-pay

## 2013-04-25 NOTE — Telephone Encounter (Signed)
Ok

## 2013-04-25 NOTE — Telephone Encounter (Signed)
Phone call from [facility] [phone] with [facility]. Patient left leg wound has gotten larger and has grey slough which is new from Monday. Patient has now agreed to see wound care. Renee left a foam pad to cover wound (if okay with you) instead of using  4x4 gauze.

## 2013-04-29 ENCOUNTER — Ambulatory Visit (INDEPENDENT_AMBULATORY_CARE_PROVIDER_SITE_OTHER): Payer: Medicare Other | Admitting: General Practice

## 2013-04-30 ENCOUNTER — Other Ambulatory Visit: Payer: Self-pay | Admitting: Internal Medicine

## 2013-04-30 NOTE — Telephone Encounter (Signed)
Faxed script back to [facility].../lmb 

## 2013-05-05 ENCOUNTER — Ambulatory Visit (INDEPENDENT_AMBULATORY_CARE_PROVIDER_SITE_OTHER): Payer: Medicare Other | Admitting: General Practice

## 2013-05-05 ENCOUNTER — Telehealth: Payer: Self-pay | Admitting: *Deleted

## 2013-05-05 DIAGNOSIS — L89609 Pressure ulcer of unspecified heel, unspecified stage: Secondary | ICD-10-CM

## 2013-05-05 LAB — POCT INR: INR: 4

## 2013-05-05 NOTE — Telephone Encounter (Signed)
Left msg on vm stating wound on (L) heel ligament there really red around area & swollen. Have a bloody drainage. Pt req md to get appt set up with wound clinic...Raechel Chute

## 2013-05-06 ENCOUNTER — Telehealth: Payer: Self-pay | Admitting: *Deleted

## 2013-05-06 NOTE — Telephone Encounter (Signed)
[name] has been notified concerning the referral.../lmb

## 2013-05-06 NOTE — Telephone Encounter (Signed)
Refer to wound clinic done - [facility] will call re: same

## 2013-05-06 NOTE — Telephone Encounter (Signed)
Will forward to Cindy

## 2013-05-06 NOTE — Telephone Encounter (Signed)
MD INR called with pt's INR of 4.0 on 05/05/2013.

## 2013-05-08 ENCOUNTER — Telehealth: Payer: Self-pay | Admitting: *Deleted

## 2013-05-08 NOTE — Telephone Encounter (Signed)
Left msg on triage stating requesting an order for wound care. Called [name] back inform her per chart office has spoken with pt and gave her appt for wound care 05/22/13...lmb

## 2013-05-12 ENCOUNTER — Ambulatory Visit (INDEPENDENT_AMBULATORY_CARE_PROVIDER_SITE_OTHER): Payer: Medicare Other | Admitting: General Practice

## 2013-05-12 ENCOUNTER — Encounter: Payer: Self-pay | Admitting: Internal Medicine

## 2013-05-13 ENCOUNTER — Encounter (HOSPITAL_BASED_OUTPATIENT_CLINIC_OR_DEPARTMENT_OTHER): Payer: Medicare Other | Attending: General Surgery

## 2013-05-13 DIAGNOSIS — R292 Abnormal reflex: Secondary | ICD-10-CM | POA: Insufficient documentation

## 2013-05-13 DIAGNOSIS — Z7901 Long term (current) use of anticoagulants: Secondary | ICD-10-CM | POA: Insufficient documentation

## 2013-05-13 DIAGNOSIS — N319 Neuromuscular dysfunction of bladder, unspecified: Secondary | ICD-10-CM | POA: Insufficient documentation

## 2013-05-13 DIAGNOSIS — L89899 Pressure ulcer of other site, unspecified stage: Secondary | ICD-10-CM | POA: Insufficient documentation

## 2013-05-13 DIAGNOSIS — Z86718 Personal history of other venous thrombosis and embolism: Secondary | ICD-10-CM | POA: Insufficient documentation

## 2013-05-13 DIAGNOSIS — L8990 Pressure ulcer of unspecified site, unspecified stage: Secondary | ICD-10-CM | POA: Insufficient documentation

## 2013-05-13 DIAGNOSIS — I959 Hypotension, unspecified: Secondary | ICD-10-CM | POA: Insufficient documentation

## 2013-05-13 DIAGNOSIS — K592 Neurogenic bowel, not elsewhere classified: Secondary | ICD-10-CM | POA: Insufficient documentation

## 2013-05-13 DIAGNOSIS — G8250 Quadriplegia, unspecified: Secondary | ICD-10-CM | POA: Insufficient documentation

## 2013-05-14 NOTE — Progress Notes (Signed)
[facility]  NAME:  [name], [name]               ACCOUNT NO.:  [id number]  MEDICAL RECORD NO.:  [id number]      DATE OF BIRTH:  1956/08/02  PHYSICIAN:  Ardath Sax, M.D.      VISIT DATE:  05/13/2013                                  OFFICE VISIT   [name] is an old patient at the Wound Clinic here at [facility]. She is a 57 year old quadriplegic who has had many pressure ulcers in the past.  She has a neurogenic bowel and bladder.  She has hyperreflexia, hypotension.  She sees Dr. Riley Kill in [facility] for management of her contractures and spasms.  She has a history of deep venous thrombosis and has been anticoagulated since 1994.  She today has posterior leg wounds that are typical pressure sores.  She had fracture of her left lower extremity in 2011, and has had problems with pressure sore since.  She also has a history of sacral decubitus ulcer, which was cleared finally with offloading and debridements and grafts, and is now presently okay.  She had normal vital signs, and she was afebrile.  She says that she has been helped to here in the past with similar problems with pressure sores on her heels and we have eventually gotten these wounds to close, so today we are going to offload her and I put silver alginate on.  I debrided the wounds into the subcu on both legs, and we put dressings on to take away the weight of the leg when she is in bed, and we will see her next week.     Ardath Sax, M.D.     PP/MEDQ  D:  05/13/2013  T:  05/14/2013  Job:  [id number]

## 2013-05-15 ENCOUNTER — Ambulatory Visit (INDEPENDENT_AMBULATORY_CARE_PROVIDER_SITE_OTHER): Payer: Medicare Other | Admitting: General Practice

## 2013-05-15 LAB — POCT INR: INR: 3.6

## 2013-05-19 ENCOUNTER — Ambulatory Visit (INDEPENDENT_AMBULATORY_CARE_PROVIDER_SITE_OTHER): Payer: Medicare Other | Admitting: General Practice

## 2013-05-26 ENCOUNTER — Ambulatory Visit (INDEPENDENT_AMBULATORY_CARE_PROVIDER_SITE_OTHER): Payer: Medicare Other | Admitting: General Practice

## 2013-05-28 ENCOUNTER — Other Ambulatory Visit: Payer: Self-pay | Admitting: Internal Medicine

## 2013-05-28 MED ORDER — ACETAMINOPHEN-CODEINE #3 300-30 MG PO TABS: ORAL_TABLET | ORAL | Status: DC

## 2013-05-29 NOTE — Telephone Encounter (Signed)
Faxed script back to [facility]...Raechel Chute

## 2013-05-30 ENCOUNTER — Telehealth: Payer: Self-pay

## 2013-05-30 DIAGNOSIS — Z7901 Long term (current) use of anticoagulants: Secondary | ICD-10-CM

## 2013-05-30 DIAGNOSIS — Z86718 Personal history of other venous thrombosis and embolism: Secondary | ICD-10-CM

## 2013-05-30 DIAGNOSIS — G8253 Quadriplegia, C5-C7 complete: Secondary | ICD-10-CM

## 2013-05-30 DIAGNOSIS — N390 Urinary tract infection, site not specified: Secondary | ICD-10-CM

## 2013-05-30 DIAGNOSIS — N319 Neuromuscular dysfunction of bladder, unspecified: Secondary | ICD-10-CM

## 2013-05-30 NOTE — Telephone Encounter (Signed)
Phone call from patient wanting to know the status of her Tylenol #3. I called [facility] and was told they do not have this prescription for the patient. I show it was printed 05/28/13. I don't know if this is something she should definitely have that's why I am sending you this message to take care of. Thanks.

## 2013-05-30 NOTE — Telephone Encounter (Signed)
Rx was fax back to [facility] yesterday. Called pharmacy spoke with pharmacist charlse he states they never received. Called verbal authorization. Notified pt with status...[name]

## 2013-06-02 ENCOUNTER — Ambulatory Visit (INDEPENDENT_AMBULATORY_CARE_PROVIDER_SITE_OTHER): Payer: Medicare Other | Admitting: General Practice

## 2013-06-05 ENCOUNTER — Encounter (HOSPITAL_BASED_OUTPATIENT_CLINIC_OR_DEPARTMENT_OTHER): Payer: Medicare Other | Attending: General Surgery

## 2013-06-05 DIAGNOSIS — L89899 Pressure ulcer of other site, unspecified stage: Secondary | ICD-10-CM | POA: Insufficient documentation

## 2013-06-05 DIAGNOSIS — L8993 Pressure ulcer of unspecified site, stage 3: Secondary | ICD-10-CM | POA: Insufficient documentation

## 2013-06-05 DIAGNOSIS — G8250 Quadriplegia, unspecified: Secondary | ICD-10-CM | POA: Insufficient documentation

## 2013-06-05 DIAGNOSIS — L02419 Cutaneous abscess of limb, unspecified: Secondary | ICD-10-CM | POA: Insufficient documentation

## 2013-06-05 DIAGNOSIS — L89609 Pressure ulcer of unspecified heel, unspecified stage: Secondary | ICD-10-CM | POA: Insufficient documentation

## 2013-06-09 ENCOUNTER — Ambulatory Visit (INDEPENDENT_AMBULATORY_CARE_PROVIDER_SITE_OTHER): Payer: Medicare Other | Admitting: General Practice

## 2013-06-12 ENCOUNTER — Ambulatory Visit: Payer: Medicare Other | Admitting: Physical Therapy

## 2013-06-12 ENCOUNTER — Inpatient Hospital Stay (HOSPITAL_COMMUNITY)
Admission: EM | Admit: 2013-06-12 | Discharge: 2013-06-15 | DRG: 602 | Disposition: A | Discharge status: Home Health | Payer: Medicare Other | Attending: Internal Medicine | Admitting: Internal Medicine

## 2013-06-12 ENCOUNTER — Encounter (HOSPITAL_COMMUNITY): Payer: Self-pay | Admitting: Cardiology

## 2013-06-12 ENCOUNTER — Inpatient Hospital Stay (HOSPITAL_COMMUNITY): Payer: Medicare Other

## 2013-06-12 DIAGNOSIS — E876 Hypokalemia: Secondary | ICD-10-CM | POA: Diagnosis present

## 2013-06-12 DIAGNOSIS — Z8249 Family history of ischemic heart disease and other diseases of the circulatory system: Secondary | ICD-10-CM

## 2013-06-12 DIAGNOSIS — I82409 Acute embolism and thrombosis of unspecified deep veins of unspecified lower extremity: Secondary | ICD-10-CM

## 2013-06-12 DIAGNOSIS — Z79899 Other long term (current) drug therapy: Secondary | ICD-10-CM

## 2013-06-12 DIAGNOSIS — M810 Age-related osteoporosis without current pathological fracture: Secondary | ICD-10-CM

## 2013-06-12 DIAGNOSIS — Z5181 Encounter for therapeutic drug level monitoring: Secondary | ICD-10-CM

## 2013-06-12 DIAGNOSIS — L0390 Cellulitis, unspecified: Secondary | ICD-10-CM

## 2013-06-12 DIAGNOSIS — L8994 Pressure ulcer of unspecified site, stage 4: Secondary | ICD-10-CM

## 2013-06-12 DIAGNOSIS — L02419 Cutaneous abscess of limb, unspecified: Principal | ICD-10-CM | POA: Diagnosis present

## 2013-06-12 DIAGNOSIS — N319 Neuromuscular dysfunction of bladder, unspecified: Secondary | ICD-10-CM

## 2013-06-12 DIAGNOSIS — L8992 Pressure ulcer of unspecified site, stage 2: Secondary | ICD-10-CM | POA: Diagnosis present

## 2013-06-12 DIAGNOSIS — Z87440 Personal history of urinary (tract) infections: Secondary | ICD-10-CM

## 2013-06-12 DIAGNOSIS — Z86718 Personal history of other venous thrombosis and embolism: Secondary | ICD-10-CM

## 2013-06-12 DIAGNOSIS — K5900 Constipation, unspecified: Secondary | ICD-10-CM | POA: Diagnosis present

## 2013-06-12 DIAGNOSIS — L089 Local infection of the skin and subcutaneous tissue, unspecified: Secondary | ICD-10-CM

## 2013-06-12 DIAGNOSIS — I959 Hypotension, unspecified: Secondary | ICD-10-CM

## 2013-06-12 DIAGNOSIS — E785 Hyperlipidemia, unspecified: Secondary | ICD-10-CM

## 2013-06-12 DIAGNOSIS — G8250 Quadriplegia, unspecified: Secondary | ICD-10-CM

## 2013-06-12 DIAGNOSIS — Z87891 Personal history of nicotine dependence: Secondary | ICD-10-CM

## 2013-06-12 DIAGNOSIS — Z7901 Long term (current) use of anticoagulants: Secondary | ICD-10-CM

## 2013-06-12 DIAGNOSIS — L89899 Pressure ulcer of other site, unspecified stage: Secondary | ICD-10-CM | POA: Diagnosis present

## 2013-06-12 DIAGNOSIS — L89509 Pressure ulcer of unspecified ankle, unspecified stage: Secondary | ICD-10-CM | POA: Diagnosis present

## 2013-06-12 DIAGNOSIS — L8990 Pressure ulcer of unspecified site, unspecified stage: Secondary | ICD-10-CM

## 2013-06-12 DIAGNOSIS — L89892 Pressure ulcer of other site, stage 2: Secondary | ICD-10-CM

## 2013-06-12 DIAGNOSIS — E559 Vitamin D deficiency, unspecified: Secondary | ICD-10-CM

## 2013-06-12 DIAGNOSIS — L8993 Pressure ulcer of unspecified site, stage 3: Secondary | ICD-10-CM | POA: Diagnosis present

## 2013-06-12 HISTORY — DX: Other cystostomy status: Z93.59

## 2013-06-12 LAB — BASIC METABOLIC PANEL
Chloride: 98 mEq/L (ref 96–112)
Creatinine, Ser: 0.36 mg/dL — ABNORMAL LOW (ref 0.50–1.10)
GFR calc Af Amer: >90 mL/min (ref >90)
GFR calc non Af Amer: >90 mL/min (ref >90)
Potassium: 4.4 mEq/L (ref 3.5–5.1)

## 2013-06-12 LAB — CBC WITH DIFFERENTIAL/PLATELET
Eosinophils Absolute: 0 10*3/uL (ref 0.0–0.7)
Eosinophils Relative: 0 % (ref 0–5)
HCT: 35.7 % — ABNORMAL LOW (ref 36.0–46.0)
Hemoglobin: 12 g/dL (ref 12.0–15.0)
Lymphocytes Relative: 30 % (ref 12–46)
Lymphs Abs: 2 10*3/uL (ref 0.7–4.0)
MCH: 31 pg (ref 26.0–34.0)
MCV: 92.2 fL (ref 78.0–100.0)
Monocytes Relative: 8 % (ref 3–12)
RBC: 3.87 MIL/uL (ref 3.87–5.11)

## 2013-06-12 LAB — PROTIME-INR: Prothrombin Time: 19.5 seconds — ABNORMAL HIGH (ref 11.6–15.2)

## 2013-06-12 MED ORDER — ONDANSETRON HCL 4 MG PO TABS: 4.0000 mg | ORAL_TABLET | Freq: Four times a day (QID) | ORAL | Status: DC | PRN

## 2013-06-12 MED ORDER — CHOLECALCIFEROL 10 MCG (400 UNIT) PO TABS
400.0000 [IU] | ORAL_TABLET | Freq: Every day | ORAL | Status: DC
  Administered 2013-06-13 – 2013-06-15 (×3): 400 [IU] via ORAL
  Filled 2013-06-12 (×3): qty 1

## 2013-06-12 MED ORDER — PIPERACILLIN-TAZOBACTAM 3.375 G IVPB
3.3750 g | Freq: Once | INTRAVENOUS | Status: AC
  Administered 2013-06-12: 3.375 g via INTRAVENOUS
  Filled 2013-06-12: qty 50

## 2013-06-12 MED ORDER — ALBUTEROL SULFATE HFA 108 (90 BASE) MCG/ACT IN AERS: 2.0000 | INHALATION_SPRAY | Freq: Four times a day (QID) | RESPIRATORY_TRACT | Status: DC | PRN

## 2013-06-12 MED ORDER — ACETAMINOPHEN-CODEINE #3 300-30 MG PO TABS: 1.0000 | ORAL_TABLET | Freq: Four times a day (QID) | ORAL | Status: DC | PRN

## 2013-06-12 MED ORDER — LORATADINE 10 MG PO TABS
10.0000 mg | ORAL_TABLET | Freq: Every day | ORAL | Status: DC
  Administered 2013-06-13 – 2013-06-15 (×3): 10 mg via ORAL
  Filled 2013-06-12 (×5): qty 1

## 2013-06-12 MED ORDER — ONDANSETRON HCL 4 MG/2ML IJ SOLN: 4.0000 mg | Freq: Four times a day (QID) | INTRAMUSCULAR | Status: DC | PRN

## 2013-06-12 MED ORDER — SIMVASTATIN 20 MG PO TABS
20.0000 mg | ORAL_TABLET | Freq: Every day | ORAL | Status: DC
  Administered 2013-06-12: 20 mg via ORAL
  Filled 2013-06-12 (×2): qty 1

## 2013-06-12 MED ORDER — CALCIUM POLYCARBOPHIL 625 MG PO TABS
625.0000 mg | ORAL_TABLET | Freq: Four times a day (QID) | ORAL | Status: DC
  Administered 2013-06-12: 23:00:00 via ORAL
  Administered 2013-06-13 (×3): 625 mg via ORAL
  Administered 2013-06-13 – 2013-06-14 (×3): via ORAL
  Administered 2013-06-14: 625 mg via ORAL
  Administered 2013-06-14: 10:00:00 via ORAL
  Administered 2013-06-15: 625 mg via ORAL
  Filled 2013-06-12 (×14): qty 1

## 2013-06-12 MED ORDER — PIPERACILLIN-TAZOBACTAM 3.375 G IVPB
3.3750 g | Freq: Three times a day (TID) | INTRAVENOUS | Status: DC
  Administered 2013-06-12 – 2013-06-15 (×8): 3.375 g via INTRAVENOUS
  Filled 2013-06-12 (×11): qty 50

## 2013-06-12 MED ORDER — VANCOMYCIN HCL 10 G IV SOLR
1250.0000 mg | Freq: Two times a day (BID) | INTRAVENOUS | Status: DC
  Administered 2013-06-13 – 2013-06-15 (×5): 1250 mg via INTRAVENOUS
  Filled 2013-06-12 (×9): qty 1250

## 2013-06-12 MED ORDER — BUMETANIDE 1 MG PO TABS
1.0000 mg | ORAL_TABLET | Freq: Every day | ORAL | Status: DC
  Administered 2013-06-12 – 2013-06-14 (×3): 1 mg via ORAL
  Filled 2013-06-12 (×5): qty 1

## 2013-06-12 MED ORDER — WARFARIN - PHARMACIST DOSING INPATIENT
Freq: Every day | Status: DC
  Administered 2013-06-13 – 2013-06-14 (×2)

## 2013-06-12 MED ORDER — DIAZEPAM 5 MG PO TABS
5.0000 mg | ORAL_TABLET | Freq: Two times a day (BID) | ORAL | Status: DC | PRN
  Administered 2013-06-12 – 2013-06-15 (×6): 5 mg via ORAL
  Filled 2013-06-12 (×6): qty 1

## 2013-06-12 MED ORDER — OXYCODONE HCL 5 MG PO TABS: 5.0000 mg | ORAL_TABLET | ORAL | Status: DC | PRN

## 2013-06-12 MED ORDER — WARFARIN SODIUM 5 MG PO TABS
5.0000 mg | ORAL_TABLET | Freq: Once | ORAL | Status: AC
  Administered 2013-06-12: 5 mg via ORAL
  Filled 2013-06-12: qty 1

## 2013-06-12 MED ORDER — OLOPATADINE HCL 0.1 % OP SOLN
1.0000 [drp] | Freq: Two times a day (BID) | OPHTHALMIC | Status: DC
  Administered 2013-06-13 – 2013-06-15 (×4): 1 [drp] via OPHTHALMIC
  Filled 2013-06-12: qty 5

## 2013-06-12 MED ORDER — CRANBERRY 400 MG PO CAPS: 4200.0000 mg | ORAL_CAPSULE | Freq: Every day | ORAL | Status: DC

## 2013-06-12 MED ORDER — BACLOFEN 20 MG PO TABS
20.0000 mg | ORAL_TABLET | Freq: Four times a day (QID) | ORAL | Status: DC
  Administered 2013-06-12 – 2013-06-15 (×12): 20 mg via ORAL
  Filled 2013-06-12 (×16): qty 1

## 2013-06-12 MED ORDER — OXYBUTYNIN CHLORIDE 5 MG PO TABS
5.0000 mg | ORAL_TABLET | Freq: Two times a day (BID) | ORAL | Status: DC
  Administered 2013-06-12 – 2013-06-15 (×6): 5 mg via ORAL
  Filled 2013-06-12 (×8): qty 1

## 2013-06-12 MED ORDER — ALPRAZOLAM 0.25 MG PO TABS: 0.2500 mg | ORAL_TABLET | Freq: Every evening | ORAL | Status: DC | PRN
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
## 2013-06-12 NOTE — ED Notes (Signed)
[facility] [phone]

## 2013-06-12 NOTE — ED Provider Notes (Signed)
Medical screening examination/treatment/procedure(s) were conducted as a shared visit with non-physician practitioner(s) and myself.  I personally evaluated the patient during the encounter.  Right ankle ulcer without surrounding cellulitis left lower leg ulcer with surrounding cellulitis.  Hurman Horn, MD 06/14/13 2001
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
## 2013-06-12 NOTE — Progress Notes (Signed)
PHARMACIST - PHYSICIAN ORDER COMMUNICATION  CONCERNING: P&T Medication Policy on Herbal Medications  DESCRIPTION:  This patient's order for:  Cranberry has been noted.  This product(s) is classified as an "herbal" or natural product. Due to a lack of definitive safety studies or FDA approval, nonstandard manufacturing practices, plus the potential risk of unknown drug-drug interactions while on inpatient medications, the Pharmacy and Therapeutics Committee does not permit the use of "herbal" or natural products of this type within [facility].   ACTION TAKEN: The pharmacy department is unable to verify this order at this time and your patient has been informed of this safety policy. Please reevaluate patient's clinical condition at discharge and address if the herbal or natural product(s) should be resumed at that time.  [employer], Pharm.D., BCPS Clinical Pharmacist Pager [phone] 06/12/2013 5:15 PM

## 2013-06-12 NOTE — Progress Notes (Signed)
Arrived to room 6n26 from ED, A/Ox4, denies nausea/pain at this time.Oriented to room and surroundings.

## 2013-06-12 NOTE — ED Notes (Addendum)
Pt reports that she was sent here [facility] for evaluation of wounds to the back of her calves. Concern for possible cellulitis. States that she was placed on antibiotics which she recently finished but does not think that the area is any better. Denies any fevers. Reports hx of spinal cord injury in 1993 and uses a wheelchair. Reports her BP is normally low from her injury

## 2013-06-12 NOTE — ED Notes (Signed)
MD at bedside. 

## 2013-06-13 ENCOUNTER — Telehealth: Payer: Self-pay | Admitting: *Deleted

## 2013-06-13 DIAGNOSIS — L8992 Pressure ulcer of unspecified site, stage 2: Secondary | ICD-10-CM

## 2013-06-13 DIAGNOSIS — L0390 Cellulitis, unspecified: Secondary | ICD-10-CM

## 2013-06-13 DIAGNOSIS — I959 Hypotension, unspecified: Secondary | ICD-10-CM

## 2013-06-13 DIAGNOSIS — L89899 Pressure ulcer of other site, unspecified stage: Secondary | ICD-10-CM

## 2013-06-13 LAB — BASIC METABOLIC PANEL
BUN: 13 mg/dL (ref 6–23)
CO2: 29 mEq/L (ref 19–32)
Calcium: 8.5 mg/dL (ref 8.4–10.5)
Chloride: 103 mEq/L (ref 96–112)
Chloride: 103 mEq/L (ref 96–112)
Creatinine, Ser: 0.59 mg/dL (ref 0.50–1.10)
GFR calc Af Amer: >90 mL/min (ref >90)
GFR calc Af Amer: >90 mL/min (ref >90)
GFR calc non Af Amer: >90 mL/min (ref >90)
Potassium: 2.9 mEq/L — ABNORMAL LOW (ref 3.5–5.1)
Sodium: 139 mEq/L (ref 135–145)
Sodium: 140 mEq/L (ref 135–145)

## 2013-06-13 LAB — CBC
HCT: 31.9 % — ABNORMAL LOW (ref 36.0–46.0)
Hemoglobin: 10.7 g/dL — ABNORMAL LOW (ref 12.0–15.0)
MCV: 93 fL (ref 78.0–100.0)
RBC: 3.43 MIL/uL — ABNORMAL LOW (ref 3.87–5.11)
WBC: 6.6 10*3/uL (ref 4.0–10.5)

## 2013-06-13 LAB — PROTIME-INR: INR: 1.77 — ABNORMAL HIGH (ref 0.00–1.49)

## 2013-06-13 MED ORDER — BISACODYL 10 MG RE SUPP
10.0000 mg | Freq: Once | RECTAL | Status: AC
  Administered 2013-06-14: 10 mg via RECTAL
  Filled 2013-06-13: qty 1

## 2013-06-13 MED ORDER — SENNA 8.6 MG PO TABS
8.0000 | ORAL_TABLET | Freq: Once | ORAL | Status: AC
  Administered 2013-06-13: 68.8 mg via ORAL
  Filled 2013-06-13: qty 8

## 2013-06-13 MED ORDER — COLLAGENASE 250 UNIT/GM EX OINT
TOPICAL_OINTMENT | Freq: Every day | CUTANEOUS | Status: DC
  Administered 2013-06-13: 15:00:00 via TOPICAL
  Administered 2013-06-14: 1 via TOPICAL
  Administered 2013-06-15: 11:00:00 via TOPICAL
  Filled 2013-06-13: qty 30

## 2013-06-13 MED ORDER — PRAVASTATIN SODIUM 40 MG PO TABS
40.0000 mg | ORAL_TABLET | Freq: Every day | ORAL | Status: DC
  Administered 2013-06-13 – 2013-06-14 (×2): 40 mg via ORAL
  Filled 2013-06-13 (×3): qty 1

## 2013-06-13 MED ORDER — POTASSIUM CHLORIDE CRYS ER 20 MEQ PO TBCR
40.0000 meq | EXTENDED_RELEASE_TABLET | ORAL | Status: AC
  Administered 2013-06-13 (×3): 40 meq via ORAL
  Filled 2013-06-13 (×3): qty 2

## 2013-06-13 MED ORDER — WARFARIN SODIUM 5 MG PO TABS
5.0000 mg | ORAL_TABLET | Freq: Once | ORAL | Status: AC
  Administered 2013-06-13: 5 mg via ORAL
  Filled 2013-06-13: qty 1

## 2013-06-13 NOTE — Progress Notes (Signed)
ANTICOAGULATION CONSULT NOTE - Follow Up Consult  Pharmacy Consult for Coumadin Indication: VTE treatment  Allergies  Allergen Reactions  . Latex     Patient Measurements: Height: 5\' 4"  (162.6 cm) Weight: 209 lb 7 oz (95 kg) IBW/kg (Calculated) : 54.7   Vital Signs: Temp: 98.3 F (36.8 C) (07/11 0624) BP: 92/45 mmHg (07/11 0624) Pulse Rate: 74 (07/11 0624)  Labs:  Recent Labs  06/12/13 1205 06/13/13 0453  HGB 12.0 10.7*  HCT 35.7* 31.9*  PLT 231 196  LABPROT 19.5* 20.1*  INR 1.70* 1.77*  CREATININE 0.36* 0.58    Estimated Creatinine Clearance: 85.7 ml/min (by C-G formula based on Cr of 0.58).   Medications:  Prescriptions prior to admission  Medication Sig Dispense Refill  . acetaminophen-codeine (TYLENOL #3) 300-30 MG per tablet TAKE 1 TABLET BY MOUTH EVERY 4 HOURS AS NEEDED.  30 tablet  5  . albuterol (PROVENTIL HFA;VENTOLIN HFA) 108 (90 BASE) MCG/ACT inhaler Inhale 2 puffs into the lungs every 6 (six) hours as needed for wheezing.  1 Inhaler  2  . ALPRAZolam (XANAX) 0.5 MG tablet Take by mouth daily as needed. 1/2 tablet      . baclofen (LIORESAL) 20 MG tablet Take 1 tablet (20 mg total) by mouth 4 (four) times daily.  120 tablet  5  . bumetanide (BUMEX) 1 MG tablet Take 1 tablet (1 mg total) by mouth daily.  31 tablet  5  . Cholecalciferol (VITAMIN D PO) Take 1 tablet by mouth daily.      . Cranberry 400 MG CAPS Take 10.5 capsules (4,200 mg total) by mouth daily.      . diazepam (VALIUM) 5 MG tablet Take 1 tablet (5 mg total) by mouth every 12 (twelve) hours as needed (for spasms).  60 tablet  5  . fexofenadine (ALLEGRA) 180 MG tablet Take 1 tablet (180 mg total) by mouth daily.      . influenza virus trivalent vaccine (FLUZONE) injection Inject 0.5 mLs into the muscle once. Administer into thigh  0.5 mL  0  . Multiple Vitamins-Minerals (CENTRUM PO) Take by mouth as directed.      Marland Kitchen olopatadine (PATANOL) 0.1 % ophthalmic solution Apply 1 drop to eye 2 (two)  times daily.      Marland Kitchen oxybutynin (DITROPAN) 5 MG tablet TAKE 1 TABLET BY MOUTH 2 TIMES DAILY.  62 tablet  3  . polycarbophil (FIBERCON) 625 MG tablet Take 625 mg by mouth 4 (four) times daily.      . potassium chloride SA (K-DUR,KLOR-CON) 20 MEQ tablet TAKE 1 TABLET BY MOUTH ONCE DAILY.  31 tablet  3  . pravastatin (PRAVACHOL) 40 MG tablet TAKE 1 TABLET BY MOUTH ONCE DAILY.  30 tablet  5  . warfarin (COUMADIN) 3 MG tablet TAKE 1 TABLET BY MOUTH AS DIRECTED.  30 tablet  3   Scheduled:  . baclofen  20 mg Oral QID  . [START ON 06/14/2013] bisacodyl  10 mg Rectal Once  . bumetanide  1 mg Oral Daily  . cholecalciferol  400 Units Oral Daily  . collagenase   Topical Daily  . loratadine  10 mg Oral Daily  . olopatadine  1 drop Both Eyes BID  . oxybutynin  5 mg Oral BID  . piperacillin-tazobactam (ZOSYN)  IV  3.375 g Intravenous Q8H  . polycarbophil  625 mg Oral QID  . potassium chloride  40 mEq Oral Q2H  . simvastatin  20 mg Oral q1800  . vancomycin  1,250  mg Intravenous Q12H  . Warfarin - Pharmacist Dosing Inpatient   Does not apply q1800    Assessment:  57 YOF sent to [facility] from [facility] for evaluation of her calves wounds that are concerning for cellulitis. She has a history of DVT on Coumadin PTA and to continue in hospital. Patient pta warfarin dose was 23mg /wk (4mg  two x/wk and 3mg  all other days dec to 3mg /d on cipro). Patient's INR is currently sub-therapeutic 1.77; no bleeding reported. Hgb 10.7 and Plt 196 down from 7/10.  ID: Cellullitis of LLE w/ pressure ulcers on bilateral calves. Pt is afebrile, WBC 6.6. Xray negative for osteo. First dose of Vanc started today at 7/11 @ 0303. Pt also started on Zosyn 7/10. Patient is quadriplegic, and CrCl may not reflect patients actual renal function.  Wound cultures: Show rare G+ rods, G + cocci in pairs.   Goal of Therapy:  INR 2-3  Monitor platelets by anticoagulation protocol: Yes  Vanc trough 10-15 mcg/mL   Plan:  - Continue  Coumadin 5mg  PO today  - Daily PT / INR  - Vanc 1250mg  IV Q12H  - Zosyn 3.375gm IV Q8H, 4 hr infusion  - Monitor renal fxn, micro data, vanc trough on 7/12  Forestine Na M 06/13/2013,12:48 PM

## 2013-06-13 NOTE — Telephone Encounter (Signed)
To forward to Dr Felicity Coyer who is PCP

## 2013-06-13 NOTE — Telephone Encounter (Signed)
Renee from [facility] health called states pt is being admitted to [facility] for DVT and wound infection.

## 2013-06-13 NOTE — Progress Notes (Addendum)
MD notified per order that pt's blood pressure was 92/45. Received a return call from Lance Coon to recheck bp in 1 hr to ensure that bp is not continuing to drop. RN on day shift will be advised to follow up at 7:45am.

## 2013-06-13 NOTE — Progress Notes (Signed)
[facility] PROGRESS NOTE  CADIE SORCI ZOX:[id number] DOB: 23-Jun-1956 DOA: 06/12/2013 PCP: Rene Paci, MD  Assessment/Plan: Cellulitis of the left lower extremity:  - x ray did not show osteomyelitis.  -  vancomycin and zosyn.  - elevate the legs.  - wound care consult - air mattress overlay  DVT of the leg:  - coumadin as per pharmacy.  - INR sub therapeutic.   Hypokalemia -replete -check mg  Hypotension -monitor as patient on the lower end most of the time  Constipation -resumed patient's specific bowel regimin from home   Code Status: full Family Communication: patient at bedside Disposition Plan:    Consultants:  Wound care  Procedures:  none  Antibiotics:    HPI/Subjective: C/o mattress C/o breakfast not being right C/o not getting right medications  Objective: Filed Vitals:   06/12/13 1758 06/12/13 2222 06/13/13 0308 06/13/13 0624  BP: 94/71 91/46  92/45  Pulse: 66 70  74  Temp: 97.7 F (36.5 C) 97.8 F (36.6 C) 98.8 F (37.1 C) 98.3 F (36.8 C)  TempSrc: Oral Oral    Resp: 20 19  18   Height:      Weight:      SpO2: 100% 99%  98%    Intake/Output Summary (Last 24 hours) at 06/13/13 0754 Last data filed at 06/12/13 2225  Gross per 24 hour  Intake      0 ml  Output    450 ml  Net   -450 ml   Filed Weights   06/12/13 1603  Weight: 95 kg (209 lb 7 oz)    Exam:   General:  A+Ox3, NAD  Cardiovascular: rrr  Respiratory: clear anterior  Abdomen: +BS,soft, NT  Musculoskeletal: moves all 4 ext   Data Reviewed: Basic Metabolic Panel:  Recent Labs Lab 06/12/13 1205 06/13/13 0453  NA 138 139  K 4.4 2.9*  CL 98 103  CO2 29 29  GLUCOSE 87 83  BUN 11 11  CREATININE 0.36* 0.58  CALCIUM 9.0 8.1*   Liver Function Tests: No results found for this basename: AST, ALT, ALKPHOS, BILITOT, PROT, ALBUMIN,  in the last 168 hours No results found for this basename: LIPASE, AMYLASE,  in the last 168 hours No results  found for this basename: AMMONIA,  in the last 168 hours CBC:  Recent Labs Lab 06/12/13 1205 06/13/13 0453  WBC 6.8 6.6  NEUTROABS 4.2  --   HGB 12.0 10.7*  HCT 35.7* 31.9*  MCV 92.2 93.0  PLT 231 196   Cardiac Enzymes: No results found for this basename: CKTOTAL, CKMB, CKMBINDEX, TROPONINI,  in the last 168 hours BNP (last 3 results) No results found for this basename: PROBNP,  in the last 8760 hours CBG: No results found for this basename: GLUCAP,  in the last 168 hours  Recent Results (from the past 240 hour(s))  WOUND CULTURE     Status: None   Collection Time    06/12/13 12:34 PM      Result Value Range Status   Specimen Description WOUND LEG LEFT LOWER   Final   Special Requests NONE   Final   Gram Stain     Final   Value: NO WBC SEEN     NO SQUAMOUS EPITHELIAL CELLS SEEN     RARE GRAM POSITIVE RODS     RARE GRAM POSITIVE COCCI     IN PAIRS   Culture PENDING   Incomplete   Report Status PENDING   Incomplete  Studies: Dg Tibia/fibula Left  06/12/2013   *RADIOLOGY REPORT*  Clinical Data: Redness and swelling left lower leg, posterior wound  LEFT TIBIA AND FIBULA - 2 VIEW  Comparison: None  Findings: IM nail with proximal and distal locking screws in tibia. Bones appear demineralized. Old healed proximal fibular and mid to distal tibial diaphyseal fractures. No acute fracture, dislocation or bone destruction. Diffuse soft tissue swelling. Bedding artifacts on lateral view.  IMPRESSION: IM nail left tibia. Significant osseous demineralization of regional soft tissue swelling. No definite acute bony findings.   Original Report Authenticated By: Ulyses Southward, M.D.    Scheduled Meds: . baclofen  20 mg Oral QID  . bumetanide  1 mg Oral Daily  . cholecalciferol  400 Units Oral Daily  . loratadine  10 mg Oral Daily  . olopatadine  1 drop Both Eyes BID  . oxybutynin  5 mg Oral BID  . piperacillin-tazobactam (ZOSYN)  IV  3.375 g Intravenous Q8H  . polycarbophil  625 mg  Oral QID  . potassium chloride  40 mEq Oral Q2H  . simvastatin  20 mg Oral q1800  . vancomycin  1,250 mg Intravenous Q12H  . Warfarin - Pharmacist Dosing Inpatient   Does not apply q1800   Continuous Infusions:   Active Problems:   Pressure ulcer of ankle   Tetraplegia   DVT (deep venous thrombosis)   Pressure ulcer of foot, stage 2   Cellulitis   Hypotension, unspecified    Time spent: 35    [facility], JESSICA  [facility] Pager [phone]. If 7PM-7AM, please contact night-coverage at [email or web address], password [facility] 06/13/2013, 7:54 AM  LOS: 1 day

## 2013-06-13 NOTE — Consult Note (Signed)
WOC consult Note Reason for Consult: evaluation of LE wounds.  Pt with history of wounds to the bilateral LE. Parapegic.  Followed by the wound care center for these wounds and has been on oral antibiotics however the LLE wound had more erythema so wound care MD sent in for evaluation  Wound type: Pressure ulcer (Unstagable) of the left lateral malleolar region, Pressure ulcer (Stage III), feel these may be device related (she wears foot drop boots)  Pressure Ulcer POA: Yes x 2 Measurement: LLE: 4cm x 2cm x 0.2cm  R posterior achilles: 3cm x 2cm x 0.2cm Wound bed: LLE: pink, moist but with 80% yellow-brown slough and small exposed tendon at the distal portion of the wound R posterior achilles: clean, pink, with early granulation Drainage (amount, consistency, odor) both with minimal serosanguinous, no odor and not purulent   Periwound: intact but some maceration at the LLE distally.  Erythema of the LLE but pt reports improved with IVabtx.  Dressing procedure/placement/frequency: Hydrogel to the exposed tendon in the LLE with enzymatic debridement ointment to the necrotic tissue daily Silver hydrofiber to the RLE posterior achilles area to absorb drainage and treat bioburdan, change every other day.    Re consult if needed, will not follow at this time. Thanks  Neale Marzette [employer], CWOCN [phone])

## 2013-06-13 NOTE — Care Management Note (Signed)
  Page 1 of 1   06/13/2013     1:01:59 PM   CARE MANAGEMENT NOTE 06/13/2013  Patient:  KAYMARIE, WYNN   Account Number:  [id number]  Date Initiated:  06/13/2013  Documentation initiated by:  Ronny Flurry  Subjective/Objective Assessment:     Action/Plan:   Anticipated DC Date:     Anticipated DC Plan:  HOME W HOME HEALTH SERVICES         Choice offered to / List presented to:             Status of service:  In process, will continue to follow Medicare Important Message given?   (If response is "NO", the following Medicare IM given date fields will be blank) Date Medicare IM given:   Date Additional Medicare IM given:    Discharge Disposition:    Per UR Regulation:    If discussed at Long Length of Stay Meetings, dates discussed:    Comments:  06-13-13 Patient active with [facility] phone [phone], fax [phone] . [place] wishing to continue with [facility] . Spoke with Cala Bradford at [facility] fax H and P , WOC note . Will need home health ordes to resume care .  Ronny Flurry RN BSN [phone]

## 2013-06-14 DIAGNOSIS — N319 Neuromuscular dysfunction of bladder, unspecified: Secondary | ICD-10-CM

## 2013-06-14 MED ORDER — WARFARIN SODIUM 4 MG PO TABS
4.0000 mg | ORAL_TABLET | Freq: Once | ORAL | Status: AC
  Administered 2013-06-14: 4 mg via ORAL
  Filled 2013-06-14: qty 1

## 2013-06-14 NOTE — Progress Notes (Signed)
ANTICOAGULATION CONSULT NOTE - Follow Up Consult  Pharmacy Consult for Coumadin Indication: VTE treatment  Allergies  Allergen Reactions  . Latex    Patient Measurements: Height: 5\' 4"  (162.6 cm) Weight: 209 lb 7 oz (95 kg) IBW/kg (Calculated) : 54.7  Vital Signs: Temp: 98.1 F (36.7 C) (07/12 0456) Temp src: Oral (07/12 0456) BP: 119/85 mmHg (07/12 0456) Pulse Rate: 67 (07/12 0456)  Labs:  Recent Labs  06/12/13 1205 06/13/13 0453 06/13/13 1710 06/14/13 0645  HGB 12.0 10.7*  --   --   HCT 35.7* 31.9*  --   --   PLT 231 196  --   --   LABPROT 19.5* 20.1*  --  22.9*  INR 1.70* 1.77*  --  2.10*  CREATININE 0.36* 0.58 0.59  --    Estimated Creatinine Clearance: 85.7 ml/min (by C-G formula based on Cr of 0.59).  Medications:  Prescriptions prior to admission  Medication Sig Dispense Refill  . acetaminophen-codeine (TYLENOL #3) 300-30 MG per tablet TAKE 1 TABLET BY MOUTH EVERY 4 HOURS AS NEEDED.  30 tablet  5  . albuterol (PROVENTIL HFA;VENTOLIN HFA) 108 (90 BASE) MCG/ACT inhaler Inhale 2 puffs into the lungs every 6 (six) hours as needed for wheezing.  1 Inhaler  2  . ALPRAZolam (XANAX) 0.5 MG tablet Take by mouth daily as needed. 1/2 tablet      . baclofen (LIORESAL) 20 MG tablet Take 1 tablet (20 mg total) by mouth 4 (four) times daily.  120 tablet  5  . bumetanide (BUMEX) 1 MG tablet Take 1 tablet (1 mg total) by mouth daily.  31 tablet  5  . Cholecalciferol (VITAMIN D PO) Take 1 tablet by mouth daily.      . Cranberry 400 MG CAPS Take 10.5 capsules (4,200 mg total) by mouth daily.      . diazepam (VALIUM) 5 MG tablet Take 1 tablet (5 mg total) by mouth every 12 (twelve) hours as needed (for spasms).  60 tablet  5  . fexofenadine (ALLEGRA) 180 MG tablet Take 1 tablet (180 mg total) by mouth daily.      . influenza virus trivalent vaccine (FLUZONE) injection Inject 0.5 mLs into the muscle once. Administer into thigh  0.5 mL  0  . Multiple Vitamins-Minerals (CENTRUM PO)  Take by mouth as directed.      Marland Kitchen olopatadine (PATANOL) 0.1 % ophthalmic solution Apply 1 drop to eye 2 (two) times daily.      Marland Kitchen oxybutynin (DITROPAN) 5 MG tablet TAKE 1 TABLET BY MOUTH 2 TIMES DAILY.  62 tablet  3  . polycarbophil (FIBERCON) 625 MG tablet Take 625 mg by mouth 4 (four) times daily.      . potassium chloride SA (K-DUR,KLOR-CON) 20 MEQ tablet TAKE 1 TABLET BY MOUTH ONCE DAILY.  31 tablet  3  . pravastatin (PRAVACHOL) 40 MG tablet TAKE 1 TABLET BY MOUTH ONCE DAILY.  30 tablet  5  . warfarin (COUMADIN) 3 MG tablet TAKE 1 TABLET BY MOUTH AS DIRECTED.  30 tablet  3   Scheduled:  . baclofen  20 mg Oral QID  . bumetanide  1 mg Oral Daily  . cholecalciferol  400 Units Oral Daily  . collagenase   Topical Daily  . loratadine  10 mg Oral Daily  . olopatadine  1 drop Both Eyes BID  . oxybutynin  5 mg Oral BID  . piperacillin-tazobactam (ZOSYN)  IV  3.375 g Intravenous Q8H  . polycarbophil  625 mg  Oral QID  . pravastatin  40 mg Oral q1800  . vancomycin  1,250 mg Intravenous Q12H  . Warfarin - Pharmacist Dosing Inpatient   Does not apply q1800   Assessment:  57 YOF sent to [facility] from [facility] for evaluation of her lower extremity leg wounds that are concerning for cellulitis. She has a history of DVT and is on chronic warfarin PTA.  We have been asked to monitor this therapy while she is hospitalized.  HOME dose:  Patient pta warfarin dose was 23mg /wk (4mg  two x/wk and 3mg  all other days dec to 3mg /d on cipro).   Today's Review: INR - 2.10 - within goal and no noted bleeding Drug/Drug Interacting Meds:  No major interacting medications currently ordered Education regarding Warfarin therapy has been provided  Goal of Therapy:  INR 2-3  Monitor platelets by anticoagulation protocol: Yes    Plan:  -  Will give home dose Coumadin 4mg  PO today  -  Daily PT / INR   Nadara Mustard, PharmD., MS Clinical Pharmacist Pager:  [phone] Thank you for allowing pharmacy to be  part of this patients care team. 06/14/2013,8:26 AM

## 2013-06-14 NOTE — Progress Notes (Signed)
[facility] PROGRESS NOTE  [name] WNU:[id number] DOB: 1956-04-18 DOA: 06/12/2013 PCP: Rene Paci, MD  Assessment/Plan: Cellulitis of the left lower extremity:  - x ray did not show osteomyelitis.  -  vancomycin and zosyn- await wound culture - elevate the legs.  - wound care consult appreciated - air mattress overlay  DVT of the leg:  - coumadin as per pharmacy.   Hypokalemia -replete -check mg  Hypotension -monitor as patient on the lower end most of the time  Constipation -resumed patient's specific bowel regimin from home   Code Status: full Family Communication: patient at bedside Disposition Plan: change to PO abx and d/c in AM   Consultants:  Wound care  Procedures:  none  Antibiotics:    HPI/Subjective: Feeling better today No fever +BM  Objective: Filed Vitals:   06/13/13 0624 06/13/13 1418 06/13/13 2155 06/14/13 0456  BP: 92/45 98/48 104/44 119/85  Pulse: 74 70 77 67  Temp: 98.3 F (36.8 C) 98.4 F (36.9 C) 98 F (36.7 C) 98.1 F (36.7 C)  TempSrc:  Oral Oral Oral  Resp: 18 18 18 18   Height:      Weight:      SpO2: 98% 98% 95% 99%    Intake/Output Summary (Last 24 hours) at 06/14/13 1050 Last data filed at 06/14/13 0455  Gross per 24 hour  Intake    240 ml  Output   3250 ml  Net  -3010 ml   Filed Weights   06/12/13 1603  Weight: 95 kg (209 lb 7 oz)    Exam:   General:  A+Ox3, NAD  Cardiovascular: rrr  Respiratory: clear anterior  Abdomen: +BS,soft, NT  Musculoskeletal: moves all 4 ext   Data Reviewed: Basic Metabolic Panel:  Recent Labs Lab 06/12/13 1205 06/13/13 0453 06/13/13 0830 06/13/13 1710  NA 138 139  --  140  K 4.4 2.9*  --  4.0  CL 98 103  --  103  CO2 29 29  --  29  GLUCOSE 87 83  --  101*  BUN 11 11  --  13  CREATININE 0.36* 0.58  --  0.59  CALCIUM 9.0 8.1*  --  8.5  MG  --   --  2.0  --    Liver Function Tests: No results found for this basename: AST, ALT, ALKPHOS,  BILITOT, PROT, ALBUMIN,  in the last 168 hours No results found for this basename: LIPASE, AMYLASE,  in the last 168 hours No results found for this basename: AMMONIA,  in the last 168 hours CBC:  Recent Labs Lab 06/12/13 1205 06/13/13 0453  WBC 6.8 6.6  NEUTROABS 4.2  --   HGB 12.0 10.7*  HCT 35.7* 31.9*  MCV 92.2 93.0  PLT 231 196   Cardiac Enzymes: No results found for this basename: CKTOTAL, CKMB, CKMBINDEX, TROPONINI,  in the last 168 hours BNP (last 3 results) No results found for this basename: PROBNP,  in the last 8760 hours CBG: No results found for this basename: GLUCAP,  in the last 168 hours  Recent Results (from the past 240 hour(s))  WOUND CULTURE     Status: None   Collection Time    06/12/13 12:34 PM      Result Value Range Status   Specimen Description WOUND LEG LEFT LOWER   Final   Special Requests NONE   Final   Gram Stain     Final   Value: NO WBC SEEN  NO SQUAMOUS EPITHELIAL CELLS SEEN     RARE GRAM POSITIVE RODS     RARE GRAM POSITIVE COCCI     IN PAIRS   Culture Culture reincubated for better growth   Final   Report Status PENDING   Incomplete     Studies: Dg Tibia/fibula Left  06/12/2013   *RADIOLOGY REPORT*  Clinical Data: Redness and swelling left lower leg, posterior wound  LEFT TIBIA AND FIBULA - 2 VIEW  Comparison: None  Findings: IM nail with proximal and distal locking screws in tibia. Bones appear demineralized. Old healed proximal fibular and mid to distal tibial diaphyseal fractures. No acute fracture, dislocation or bone destruction. Diffuse soft tissue swelling. Bedding artifacts on lateral view.  IMPRESSION: IM nail left tibia. Significant osseous demineralization of regional soft tissue swelling. No definite acute bony findings.   Original Report Authenticated By: Ulyses Southward, M.D.    Scheduled Meds: . baclofen  20 mg Oral QID  . bumetanide  1 mg Oral Daily  . cholecalciferol  400 Units Oral Daily  . collagenase   Topical Daily   . loratadine  10 mg Oral Daily  . olopatadine  1 drop Both Eyes BID  . oxybutynin  5 mg Oral BID  . piperacillin-tazobactam (ZOSYN)  IV  3.375 g Intravenous Q8H  . polycarbophil  625 mg Oral QID  . pravastatin  40 mg Oral q1800  . vancomycin  1,250 mg Intravenous Q12H  . warfarin  4 mg Oral ONCE-1800  . Warfarin - Pharmacist Dosing Inpatient   Does not apply q1800   Continuous Infusions:   Active Problems:   Pressure ulcer of ankle   Tetraplegia   DVT (deep venous thrombosis)   Pressure ulcer of foot, stage 2   Cellulitis   Hypotension, unspecified    Time spent: 35    [facility], Taz Vanness  [facility] Pager [phone]. If 7PM-7AM, please contact night-coverage at [email or web address], password [facility] 06/14/2013, 10:50 AM  LOS: 2 days

## 2013-06-14 NOTE — Progress Notes (Signed)
Assessment completed, pt denies any pain at this time. Pt express some discomfort and feeling upset about the schedule of her Valium, she refers that she usually takes this medication at home 10 am and 10 pm. Pharmacy called to give valium before 12 am as schedule. Ok with pharmacy to give medication before at this time per pt request. Pt continues with suprapubic catheter draining clear urine at this time. We'll continue with POC.

## 2013-06-15 LAB — PROTIME-INR: Prothrombin Time: 26.9 seconds — ABNORMAL HIGH (ref 11.6–15.2)

## 2013-06-15 MED ORDER — COLLAGENASE 250 UNIT/GM EX OINT: TOPICAL_OINTMENT | Freq: Every day | CUTANEOUS | Status: DC

## 2013-06-15 MED ORDER — CLINDAMYCIN HCL 150 MG PO CAPS: 300.0000 mg | ORAL_CAPSULE | Freq: Three times a day (TID) | ORAL | Status: DC

## 2013-06-15 MED ORDER — WARFARIN SODIUM 3 MG PO TABS
3.0000 mg | ORAL_TABLET | Freq: Every day | ORAL | Status: DC
  Filled 2013-06-15: qty 1

## 2013-06-15 MED ORDER — POTASSIUM CHLORIDE CRYS ER 20 MEQ PO TBCR
20.0000 meq | EXTENDED_RELEASE_TABLET | Freq: Every day | ORAL | Status: DC
  Administered 2013-06-15: 20 meq via ORAL
  Filled 2013-06-15: qty 1

## 2013-06-15 NOTE — Progress Notes (Signed)
ANTICOAGULATION CONSULT NOTE - Follow Up Consult Antibiotic Note - Follow Up  Pharmacy Consult for Coumadin Indication: VTE treatment  Allergies  Allergen Reactions  . Latex    Patient Measurements: Height: 5\' 4"  (162.6 cm) Weight: 209 lb 7 oz (95 kg) IBW/kg (Calculated) : 54.7  Vital Signs: Temp: 97.7 F (36.5 C) (07/13 0549) Temp src: Oral (07/13 0549) BP: 101/41 mmHg (07/13 0549) Pulse Rate: 67 (07/13 0549)  Labs:  Recent Labs  06/12/13 1205 06/13/13 0453 06/13/13 1710 06/14/13 0645 06/15/13 0635  HGB 12.0 10.7*  --   --   --   HCT 35.7* 31.9*  --   --   --   PLT 231 196  --   --   --   LABPROT 19.5* 20.1*  --  22.9* 26.9*  INR 1.70* 1.77*  --  2.10* 2.59*  CREATININE 0.36* 0.58 0.59  --   --    Estimated Creatinine Clearance: 85.7 ml/min (by C-G formula based on Cr of 0.59).  Medications:  Prescriptions prior to admission  Medication Sig Dispense Refill  . acetaminophen-codeine (TYLENOL #3) 300-30 MG per tablet TAKE 1 TABLET BY MOUTH EVERY 4 HOURS AS NEEDED.  30 tablet  5  . albuterol (PROVENTIL HFA;VENTOLIN HFA) 108 (90 BASE) MCG/ACT inhaler Inhale 2 puffs into the lungs every 6 (six) hours as needed for wheezing.  1 Inhaler  2  . ALPRAZolam (XANAX) 0.5 MG tablet Take by mouth daily as needed. 1/2 tablet      . baclofen (LIORESAL) 20 MG tablet Take 1 tablet (20 mg total) by mouth 4 (four) times daily.  120 tablet  5  . bumetanide (BUMEX) 1 MG tablet Take 1 tablet (1 mg total) by mouth daily.  31 tablet  5  . Cholecalciferol (VITAMIN D PO) Take 1 tablet by mouth daily.      . Cranberry 400 MG CAPS Take 10.5 capsules (4,200 mg total) by mouth daily.      . diazepam (VALIUM) 5 MG tablet Take 1 tablet (5 mg total) by mouth every 12 (twelve) hours as needed (for spasms).  60 tablet  5  . fexofenadine (ALLEGRA) 180 MG tablet Take 1 tablet (180 mg total) by mouth daily.      . influenza virus trivalent vaccine (FLUZONE) injection Inject 0.5 mLs into the muscle once.  Administer into thigh  0.5 mL  0  . Multiple Vitamins-Minerals (CENTRUM PO) Take by mouth as directed.      Marland Kitchen olopatadine (PATANOL) 0.1 % ophthalmic solution Apply 1 drop to eye 2 (two) times daily.      Marland Kitchen oxybutynin (DITROPAN) 5 MG tablet TAKE 1 TABLET BY MOUTH 2 TIMES DAILY.  62 tablet  3  . polycarbophil (FIBERCON) 625 MG tablet Take 625 mg by mouth 4 (four) times daily.      . potassium chloride SA (K-DUR,KLOR-CON) 20 MEQ tablet TAKE 1 TABLET BY MOUTH ONCE DAILY.  31 tablet  3  . pravastatin (PRAVACHOL) 40 MG tablet TAKE 1 TABLET BY MOUTH ONCE DAILY.  30 tablet  5  . warfarin (COUMADIN) 3 MG tablet TAKE 1 TABLET BY MOUTH AS DIRECTED.  30 tablet  3   Scheduled:  . baclofen  20 mg Oral QID  . bumetanide  1 mg Oral Daily  . cholecalciferol  400 Units Oral Daily  . collagenase   Topical Daily  . loratadine  10 mg Oral Daily  . olopatadine  1 drop Both Eyes BID  . oxybutynin  5 mg Oral BID  . piperacillin-tazobactam (ZOSYN)  IV  3.375 g Intravenous Q8H  . polycarbophil  625 mg Oral QID  . pravastatin  40 mg Oral q1800  . vancomycin  1,250 mg Intravenous Q12H  . Warfarin - Pharmacist Dosing Inpatient   Does not apply q1800   Assessment:  57 YOF sent to [facility] from [facility] for evaluation of her lower extremity leg wounds that are concerning for cellulitis. She has a history of DVT and is on chronic warfarin PTA.  We have been asked to monitor this therapy while she is hospitalized.  HOME dose:  Patient pta warfarin dose was 23mg /wk (4mg  two x/wk and 3mg  all other days dec to 3mg /d on cipro).   Today's Review: Anticoagulation: INR - 2.59 - within goal and no noted bleeding Drug/Drug Interacting Meds:  No major interacting medications currently ordered Education regarding Warfarin therapy has been provided  Antibiotic: - Day #3 Assessment - Afebrile and most recent WBC shows downward trend and WNL.   Cellulitis without osteomyelitis on xray Renal - No BMet today, but patient  continues to have good UOP documented at 1.3 ml/kg/hr.  She has been negative with I&O's. Cultures: - Wound culture reveals abundant staph aureus (preliminary results) - she is on Vanc/Zosyn combination which should more than cover.  Hopefully, can de-escalate antibiotics soon.   Goal of Therapy:  INR 2-3  Monitor platelets by anticoagulation protocol: Yes   Plan:  -  Will start Warfarin 3 mg daily today  -  Daily PT / INR  -  Continue IV antibiotics as ordered  Nadara Mustard, PharmD., MS Clinical Pharmacist Pager:  [phone] Thank you for allowing pharmacy to be part of this patients care team. 06/15/2013,7:56 AM

## 2013-06-15 NOTE — Progress Notes (Signed)
Discharge instructions gone over with patient. Daughter is present. Patient is followed by [facility] home health and they have been notified of discharge. Home medications gone over. Prescriptions given. Follow up appointments have been made. Patient verbalized understanding of instructions and has signed up for [facility]. She verbalized understanding of instructions.

## 2013-06-15 NOTE — Discharge Summary (Addendum)
Physician Discharge Summary  [name] UJW:[id number] DOB: 05-26-1956 DOA: 06/12/2013  PCP: Rene Paci, MD  Admit date: 06/12/2013 Discharge date: 06/15/2013  Time spent: 35 minutes  Recommendations for Outpatient Follow-up:  1. Follow up with wound care 2. INR in 3 days 3. Resume home health with wound care instructions; change foley as before 4. BMP in 1 week  Discharge Diagnoses:  Active Problems:   Pressure ulcer of ankle   Tetraplegia   DVT (deep venous thrombosis)   Pressure ulcer of foot, stage 2   Cellulitis   Hypotension, unspecified   Discharge Condition: improved  Diet recommendation: regular  Filed Weights   06/12/13 1603  Weight: 95 kg (209 lb 7 oz)    History of present illness:  [name] is a 57 y.o. female with h/o DVT on coumadin, neurogenic bladder, was sent in from wound care center for non healing ulcer ont he left lower extremity on the posterior lower leg, and surrounding cellulitic changes, . She denies any fever or chills. On arrival to ED, her labs did not reveal leukocytosis . Her x ray of the leg did not reveal osteomyelitis. She is referred to medical service for admission for cellulitis   Hospital Course:  Cellulitis of the left lower extremity:  - x ray did not show osteomyelitis.  - vancomycin and zosyn- wound culture staph a- change to oral and follow up with wound care center on Friday:  - elevate the legs.  - wound care consult appreciated  - no fever, no chills- redness decreased but patient insisted on putting on TED hose which made it difficult to monitor wounds  DVT of the leg:  - coumadin  Hypokalemia  -repleted   Hypotension  -stable   Constipation  -resumed patient's specific bowel regimin from home   Procedures:    Consultations:  Wound care  Discharge Exam: Filed Vitals:   06/14/13 0456 06/14/13 1338 06/14/13 2215 06/15/13 0549  BP: 119/85 111/59 97/50 101/41  Pulse: 67 59 63 67  Temp:  98.1 F (36.7 C) 98.1 F (36.7 C) 98.3 F (36.8 C) 97.7 F (36.5 C)  TempSrc: Oral  Oral Oral  Resp: 18 20 20 20   Height:      Weight:      SpO2: 99% 99% 99% 98%    General: A+OX3, NAD Cardiovascular: rrr Respiratory: clear anterior  Discharge Instructions      Discharge Orders   Future Appointments Provider Department Dept Phone   07/10/2013 9:30 AM Berneice Heinrich, PT [facility] [phone]   08/27/2013 1:00 PM Newt Lukes, MD [facility] [phone]   Future Orders Complete By Expires     Diet - low sodium heart healthy  As directed     Discharge instructions  As directed     Comments:      Resume home health Hydrogel to the exposed tendon in the LLE with enzymatic debridement ointment to the necrotic tissue daily Silver hydrofiber to the RLE posterior achilles area to absorb drainage and treat bioburdan, change every other day. --until follow up at wound care center on Thursday    Increase activity slowly  As directed         Medication List    STOP taking these medications       fexofenadine 180 MG tablet  Commonly known as:  ALLEGRA     influenza virus trivalent vaccine injection  Commonly known as:  FLUZONE  TAKE these medications       acetaminophen-codeine 300-30 MG per tablet  Commonly known as:  TYLENOL #3  TAKE 1 TABLET BY MOUTH EVERY 4 HOURS AS NEEDED.     albuterol 108 (90 BASE) MCG/ACT inhaler  Commonly known as:  PROVENTIL HFA;VENTOLIN HFA  Inhale 2 puffs into the lungs every 6 (six) hours as needed for wheezing.     ALPRAZolam 0.5 MG tablet  Commonly known as:  XANAX  Take by mouth daily as needed. 1/2 tablet     baclofen 20 MG tablet  Commonly known as:  LIORESAL  Take 1 tablet (20 mg total) by mouth 4 (four) times daily.     bumetanide 1 MG tablet  Commonly known as:  BUMEX  Take 1 tablet (1 mg total) by mouth daily.     CENTRUM PO  Take by mouth  as directed.     clindamycin 150 MG capsule  Commonly known as:  CLEOCIN  Take 2 capsules (300 mg total) by mouth 3 (three) times daily.     collagenase ointment  Commonly known as:  SANTYL  Apply topically daily.     Cranberry 400 MG Caps  Take 10.5 capsules (4,200 mg total) by mouth daily.     diazepam 5 MG tablet  Commonly known as:  VALIUM  Take 1 tablet (5 mg total) by mouth every 12 (twelve) hours as needed (for spasms).     olopatadine 0.1 % ophthalmic solution  Commonly known as:  PATANOL  Apply 1 drop to eye 2 (two) times daily.     oxybutynin 5 MG tablet  Commonly known as:  DITROPAN  TAKE 1 TABLET BY MOUTH 2 TIMES DAILY.     polycarbophil 625 MG tablet  Commonly known as:  FIBERCON  Take 625 mg by mouth 4 (four) times daily.     potassium chloride SA 20 MEQ tablet  Commonly known as:  K-DUR,KLOR-CON  TAKE 1 TABLET BY MOUTH ONCE DAILY.     pravastatin 40 MG tablet  Commonly known as:  PRAVACHOL  TAKE 1 TABLET BY MOUTH ONCE DAILY.     VITAMIN D PO  Take 1 tablet by mouth daily.     warfarin 3 MG tablet  Commonly known as:  COUMADIN  TAKE 1 TABLET BY MOUTH AS DIRECTED.       Allergies  Allergen Reactions  . Latex       The results of significant diagnostics from this hospitalization (including imaging, microbiology, ancillary and laboratory) are listed below for reference.    Significant Diagnostic Studies: Dg Tibia/fibula Left  06/12/2013   *RADIOLOGY REPORT*  Clinical Data: Redness and swelling left lower leg, posterior wound  LEFT TIBIA AND FIBULA - 2 VIEW  Comparison: None  Findings: IM nail with proximal and distal locking screws in tibia. Bones appear demineralized. Old healed proximal fibular and mid to distal tibial diaphyseal fractures. No acute fracture, dislocation or bone destruction. Diffuse soft tissue swelling. Bedding artifacts on lateral view.  IMPRESSION: IM nail left tibia. Significant osseous demineralization of regional soft tissue  swelling. No definite acute bony findings.   Original Report Authenticated By: Ulyses Southward, M.D.    Microbiology: Recent Results (from the past 240 hour(s))  WOUND CULTURE     Status: None   Collection Time    06/12/13 12:34 PM      Result Value Range Status   Specimen Description WOUND LEG LEFT LOWER   Final   Special Requests NONE  Final   Gram Stain     Final   Value: NO WBC SEEN     NO SQUAMOUS EPITHELIAL CELLS SEEN     RARE GRAM POSITIVE RODS     RARE GRAM POSITIVE COCCI     IN PAIRS   Culture     Final   Value: ABUNDANT STAPHYLOCOCCUS AUREUS     Note: RIFAMPIN AND GENTAMICIN SHOULD NOT BE USED AS SINGLE DRUGS FOR TREATMENT OF STAPH INFECTIONS.   Report Status PENDING   Incomplete     Labs: Basic Metabolic Panel:  Recent Labs Lab 06/12/13 1205 06/13/13 0453 06/13/13 0830 06/13/13 1710  NA 138 139  --  140  K 4.4 2.9*  --  4.0  CL 98 103  --  103  CO2 29 29  --  29  GLUCOSE 87 83  --  101*  BUN 11 11  --  13  CREATININE 0.36* 0.58  --  0.59  CALCIUM 9.0 8.1*  --  8.5  MG  --   --  2.0  --    Liver Function Tests: No results found for this basename: AST, ALT, ALKPHOS, BILITOT, PROT, ALBUMIN,  in the last 168 hours No results found for this basename: LIPASE, AMYLASE,  in the last 168 hours No results found for this basename: AMMONIA,  in the last 168 hours CBC:  Recent Labs Lab 06/12/13 1205 06/13/13 0453  WBC 6.8 6.6  NEUTROABS 4.2  --   HGB 12.0 10.7*  HCT 35.7* 31.9*  MCV 92.2 93.0  PLT 231 196   Cardiac Enzymes: No results found for this basename: CKTOTAL, CKMB, CKMBINDEX, TROPONINI,  in the last 168 hours BNP: BNP (last 3 results) No results found for this basename: PROBNP,  in the last 8760 hours CBG: No results found for this basename: GLUCAP,  in the last 168 hours     Signed:  Benjamine Mola, JESSICA  [facility] 06/15/2013, 1:28 PM

## 2013-06-15 NOTE — Progress Notes (Signed)
   CARE MANAGEMENT NOTE 06/15/2013  Patient:  [name], [name]   Account Number:  [id number]  Date Initiated:  06/13/2013  Documentation initiated by:  Ronny Flurry  Subjective/Objective Assessment:   Pressure ulcer of ankle  Tetraplegia  DVT (deep venous thrombosis)  Pressure ulcer of foot, stage 2  Cellulitis  Hypotension, unspecified     Action/Plan:   Anticipated DC Date:  06/15/2013   Anticipated DC Plan:  HOME W HOME HEALTH SERVICES         [facility] Choice  Resumption Of Svcs/PTA Provider   Choice offered to / List presented to:  C-1 Patient        HH arranged  HH-1 RN  HH-4 NURSE'S AIDE      Status of service:  Completed, signed off Medicare Important Message given?   (If response is "NO", the following Medicare IM given date fields will be blank) Date Medicare IM given:   Date Additional Medicare IM given:    Discharge Disposition:  HOME W HOME HEALTH SERVICES  Per UR Regulation:    If discussed at Long Length of Stay Meetings, dates discussed:    Comments:  06/15/2013 1445 NCM spoke to [facility] # [phone], oncall RN with [facility]. Faxed dc summary and orders to [phone] and [phone]. Unit RN will do dressing change. Pt is requesting soc for Monday. Isidoro Donning RN CCM Case Mgmt phone [phone]  06-13-13 Patient active with [facility] phone [phone], fax [phone] . Pat[place] wishing to continue with [facility] . Spoke with Cala Bradford at [facility] fax H and P , WOC note . Will need home health ordes to resume care .  Ronny Flurry RN BSN [phone]

## 2013-06-16 ENCOUNTER — Telehealth: Payer: Self-pay | Admitting: *Deleted

## 2013-06-16 ENCOUNTER — Ambulatory Visit: Payer: Self-pay | Admitting: General Practice

## 2013-06-16 LAB — WOUND CULTURE

## 2013-06-16 LAB — POCT INR: INR: 3.6

## 2013-06-16 NOTE — Telephone Encounter (Signed)
Ok for [facility] to draw on Mon  thanks

## 2013-06-16 NOTE — Telephone Encounter (Signed)
Renee called states upon discharge from hospital, pt was ordered a BMP to be drawn in one week, requesting BMP be drawn on Monday instead of Sunday.  Please advise

## 2013-06-17 NOTE — Telephone Encounter (Signed)
Spoke with Luster Landsberg advised her of MDs message.

## 2013-06-19 ENCOUNTER — Ambulatory Visit: Payer: Self-pay | Admitting: General Practice

## 2013-06-19 LAB — POCT INR: INR: 2

## 2013-06-24 ENCOUNTER — Telehealth: Payer: Self-pay | Admitting: *Deleted

## 2013-06-24 NOTE — Telephone Encounter (Signed)
Renee from [facility] called requesting a new recertification period from 7.27.2014 thru 9.24.2014 for weekly wound care and super pubic catheter change every other week.  Aide services 3 times a week and Skilled Nursing visits PRN.  Mobility visit will on Friday.  If BMP has not been received please call.

## 2013-06-24 NOTE — Telephone Encounter (Signed)
Verbal okay for all requested services, place and paperwork I will sign I received fax of bmet lab (results were normal) -thanks

## 2013-06-24 NOTE — Telephone Encounter (Signed)
Left detail message on Renee's VM.

## 2013-06-30 ENCOUNTER — Telehealth: Payer: Self-pay | Admitting: *Deleted

## 2013-06-30 ENCOUNTER — Ambulatory Visit (INDEPENDENT_AMBULATORY_CARE_PROVIDER_SITE_OTHER): Payer: Medicare Other | Admitting: General Practice

## 2013-06-30 LAB — POCT INR

## 2013-06-30 NOTE — Telephone Encounter (Signed)
Renee called to give update on pts home visits.   Every other week for Super Pubic changes.  States [facility] change wound care to every Wednesday.

## 2013-07-02 ENCOUNTER — Telehealth: Payer: Self-pay | Admitting: *Deleted

## 2013-07-02 ENCOUNTER — Encounter: Payer: Self-pay | Admitting: Internal Medicine

## 2013-07-02 MED ORDER — BACLOFEN 20 MG PO TABS: 20.0000 mg | ORAL_TABLET | Freq: Four times a day (QID) | ORAL | Status: DC

## 2013-07-02 MED ORDER — DIAZEPAM 5 MG PO TABS: 5.0000 mg | ORAL_TABLET | Freq: Two times a day (BID) | ORAL | Status: DC | PRN

## 2013-07-02 MED ORDER — BUMETANIDE 1 MG PO TABS: 1.0000 mg | ORAL_TABLET | Freq: Every day | ORAL | Status: DC

## 2013-07-02 NOTE — Telephone Encounter (Signed)
Renee, RN called requesting PRN visit for today.  Pt injured her Right Great Toe; Luster Landsberg is requesting an order to apply a Vaseline Guaze to wound.  Please advise

## 2013-07-02 NOTE — Telephone Encounter (Signed)
Called hospice back since was after hours gave verbal order to answering service will send msg to [facility]...Raechel Chute

## 2013-07-02 NOTE — Telephone Encounter (Signed)
Ok for verbal order  °

## 2013-07-02 NOTE — Telephone Encounter (Signed)
Ok thanks 

## 2013-07-02 NOTE — Telephone Encounter (Signed)
Called refill in on pt diazepam spoke with jim/pharmacist gave md resposne...lmb

## 2013-07-07 ENCOUNTER — Ambulatory Visit (INDEPENDENT_AMBULATORY_CARE_PROVIDER_SITE_OTHER): Payer: Self-pay | Admitting: General Practice

## 2013-07-07 DIAGNOSIS — Z79899 Other long term (current) drug therapy: Secondary | ICD-10-CM

## 2013-07-10 ENCOUNTER — Encounter (HOSPITAL_BASED_OUTPATIENT_CLINIC_OR_DEPARTMENT_OTHER): Payer: Medicare Other | Attending: Internal Medicine

## 2013-07-10 ENCOUNTER — Ambulatory Visit: Payer: Medicare Other | Admitting: Rehabilitative and Restorative Service Providers"

## 2013-07-10 DIAGNOSIS — L89899 Pressure ulcer of other site, unspecified stage: Secondary | ICD-10-CM | POA: Insufficient documentation

## 2013-07-10 DIAGNOSIS — I872 Venous insufficiency (chronic) (peripheral): Secondary | ICD-10-CM | POA: Insufficient documentation

## 2013-07-10 DIAGNOSIS — L8993 Pressure ulcer of unspecified site, stage 3: Secondary | ICD-10-CM | POA: Insufficient documentation

## 2013-07-14 ENCOUNTER — Ambulatory Visit (INDEPENDENT_AMBULATORY_CARE_PROVIDER_SITE_OTHER): Payer: Self-pay | Admitting: General Practice

## 2013-07-14 DIAGNOSIS — Z79899 Other long term (current) drug therapy: Secondary | ICD-10-CM

## 2013-07-14 LAB — POCT INR: INR: 3.2

## 2013-07-21 ENCOUNTER — Ambulatory Visit (INDEPENDENT_AMBULATORY_CARE_PROVIDER_SITE_OTHER): Payer: Medicare Other | Admitting: General Practice

## 2013-07-21 ENCOUNTER — Telehealth: Payer: Self-pay | Admitting: *Deleted

## 2013-07-21 DIAGNOSIS — Z79899 Other long term (current) drug therapy: Secondary | ICD-10-CM

## 2013-07-21 LAB — POCT INR: INR: 2.4

## 2013-07-21 NOTE — Telephone Encounter (Signed)
Okay for wound care dressing of choice per home health recommendations pending followup with wound clinic this week. Thanks

## 2013-07-21 NOTE — Telephone Encounter (Signed)
Left msg on vm stating we have notice new area on her bottom above the (R) buttock, and down below it a red raw open area. Want to get order to put (?) omiprex border over, or what md recommends until she is able to see wound clinic appt this Thursday...Raechel Chute

## 2013-07-21 NOTE — Telephone Encounter (Signed)
Notified Renee with md response...lmb 

## 2013-07-28 ENCOUNTER — Ambulatory Visit (INDEPENDENT_AMBULATORY_CARE_PROVIDER_SITE_OTHER): Payer: Self-pay | Admitting: General Practice

## 2013-07-28 DIAGNOSIS — Z79899 Other long term (current) drug therapy: Secondary | ICD-10-CM

## 2013-07-31 ENCOUNTER — Other Ambulatory Visit (HOSPITAL_BASED_OUTPATIENT_CLINIC_OR_DEPARTMENT_OTHER): Payer: Self-pay | Admitting: Internal Medicine

## 2013-08-05 ENCOUNTER — Ambulatory Visit (INDEPENDENT_AMBULATORY_CARE_PROVIDER_SITE_OTHER): Payer: Medicare Other | Admitting: General Practice

## 2013-08-07 ENCOUNTER — Encounter (HOSPITAL_BASED_OUTPATIENT_CLINIC_OR_DEPARTMENT_OTHER): Payer: Medicare Other | Attending: Internal Medicine

## 2013-08-07 DIAGNOSIS — L89899 Pressure ulcer of other site, unspecified stage: Secondary | ICD-10-CM | POA: Insufficient documentation

## 2013-08-07 DIAGNOSIS — L8993 Pressure ulcer of unspecified site, stage 3: Secondary | ICD-10-CM | POA: Insufficient documentation

## 2013-08-07 DIAGNOSIS — I872 Venous insufficiency (chronic) (peripheral): Secondary | ICD-10-CM | POA: Insufficient documentation

## 2013-08-11 ENCOUNTER — Ambulatory Visit (INDEPENDENT_AMBULATORY_CARE_PROVIDER_SITE_OTHER): Payer: Medicare Other | Admitting: General Practice

## 2013-08-11 DIAGNOSIS — I82409 Acute embolism and thrombosis of unspecified deep veins of unspecified lower extremity: Secondary | ICD-10-CM

## 2013-08-13 LAB — PROTIME-INR: INR: 2.2 — AB (ref 0.9–1.1)

## 2013-08-15 DIAGNOSIS — G8253 Quadriplegia, C5-C7 complete: Secondary | ICD-10-CM

## 2013-08-15 DIAGNOSIS — N390 Urinary tract infection, site not specified: Secondary | ICD-10-CM

## 2013-08-15 DIAGNOSIS — N319 Neuromuscular dysfunction of bladder, unspecified: Secondary | ICD-10-CM

## 2013-08-15 DIAGNOSIS — Z86718 Personal history of other venous thrombosis and embolism: Secondary | ICD-10-CM

## 2013-08-15 DIAGNOSIS — Z7901 Long term (current) use of anticoagulants: Secondary | ICD-10-CM

## 2013-08-18 ENCOUNTER — Ambulatory Visit (INDEPENDENT_AMBULATORY_CARE_PROVIDER_SITE_OTHER): Payer: Medicare Other | Admitting: General Practice

## 2013-08-18 ENCOUNTER — Telehealth: Payer: Self-pay | Admitting: *Deleted

## 2013-08-18 DIAGNOSIS — Z79899 Other long term (current) drug therapy: Secondary | ICD-10-CM

## 2013-08-18 LAB — POCT INR: INR: 1.7

## 2013-08-18 NOTE — Telephone Encounter (Signed)
Can you take care of this for me.

## 2013-08-18 NOTE — Telephone Encounter (Signed)
Called to inform of INR result on 9.15.14 of 1.7.  Please advise in MDs absence

## 2013-08-25 ENCOUNTER — Telehealth: Payer: Self-pay | Admitting: *Deleted

## 2013-08-25 ENCOUNTER — Ambulatory Visit: Payer: Medicare Other | Admitting: General Practice

## 2013-08-25 NOTE — Telephone Encounter (Signed)
Notified Renee with md response...lmb 

## 2013-08-25 NOTE — Telephone Encounter (Signed)
Left msg on vm stating needing to get verbal order for recertification we are out there every week now for wound care. Dates starts @ 08/28/13-10-26-13...Raechel Chute

## 2013-08-25 NOTE — Telephone Encounter (Signed)
Ok thanks 

## 2013-08-27 ENCOUNTER — Ambulatory Visit (INDEPENDENT_AMBULATORY_CARE_PROVIDER_SITE_OTHER): Payer: Medicare Other | Admitting: Internal Medicine

## 2013-08-27 ENCOUNTER — Encounter: Payer: Self-pay | Admitting: Internal Medicine

## 2013-08-27 ENCOUNTER — Other Ambulatory Visit (INDEPENDENT_AMBULATORY_CARE_PROVIDER_SITE_OTHER): Payer: Medicare Other

## 2013-08-27 VITALS — BP 112/70 | HR 54 | Temp 97.0°F

## 2013-08-27 DIAGNOSIS — L8995 Pressure ulcer of unspecified site, unstageable: Secondary | ICD-10-CM

## 2013-08-27 DIAGNOSIS — L8992 Pressure ulcer of unspecified site, stage 2: Secondary | ICD-10-CM

## 2013-08-27 DIAGNOSIS — G8250 Quadriplegia, unspecified: Secondary | ICD-10-CM

## 2013-08-27 DIAGNOSIS — E785 Hyperlipidemia, unspecified: Secondary | ICD-10-CM

## 2013-08-27 DIAGNOSIS — L89520 Pressure ulcer of left ankle, unstageable: Secondary | ICD-10-CM

## 2013-08-27 DIAGNOSIS — L89899 Pressure ulcer of other site, unspecified stage: Secondary | ICD-10-CM

## 2013-08-27 DIAGNOSIS — L89892 Pressure ulcer of other site, stage 2: Secondary | ICD-10-CM

## 2013-08-27 DIAGNOSIS — Z23 Encounter for immunization: Secondary | ICD-10-CM

## 2013-08-27 DIAGNOSIS — L0390 Cellulitis, unspecified: Secondary | ICD-10-CM

## 2013-08-27 DIAGNOSIS — L0291 Cutaneous abscess, unspecified: Secondary | ICD-10-CM

## 2013-08-27 DIAGNOSIS — L89509 Pressure ulcer of unspecified ankle, unspecified stage: Secondary | ICD-10-CM

## 2013-08-27 LAB — LIPID PANEL
Cholesterol: 186 mg/dL (ref 0–200)
HDL: 50.6 mg/dL (ref >39.00)
Total CHOL/HDL Ratio: 4
Triglycerides: 95 mg/dL (ref 0.0–149.0)

## 2013-08-27 MED ORDER — AMOXICILLIN 500 MG PO CAPS: ORAL_CAPSULE | ORAL | Status: DC

## 2013-08-27 NOTE — Assessment & Plan Note (Signed)
On statin therapy for same.  dose increase November 2012 because of uncontrolled lipids Labs reviewed, check annually or as needed 
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
## 2013-08-27 NOTE — Patient Instructions (Signed)
It was good to see you today. We have reviewed your prior records including labs and tests today Medications reviewed and updated, no changes at this time. Will investigate other potential options for wound care at home. Until then, continue with [facility] wound care as ongoing we'll make referral for screening mammogram and colonoscopy after wounds are healed.  Test(s) ordered today. Your results will be released to MyChart (or called to you) after review, usually within 72hours after test completion. If any changes need to be made, you will be notified at that same time. Please schedule followup in 6-12 months for routine monitoring, call sooner if problems.
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
## 2013-08-27 NOTE — Assessment & Plan Note (Addendum)
Hospitalization and treatment July 2014 reviewed Reviewed ongoing wound care for decub (thigh, heel, sacral) - ?option for [facility] wound care at pt request

## 2013-08-29 ENCOUNTER — Telehealth: Payer: Self-pay | Admitting: *Deleted

## 2013-08-29 NOTE — Telephone Encounter (Signed)
Left msg on vm stating wanted to let md know the area where pt received flu shot is red. Going down her thigh, a little warmer than other skin on her body. Will be back out on Monday will recheck area, but if md have orders pls return call...lmb

## 2013-08-29 NOTE — Telephone Encounter (Signed)
Described local reaction seems appropriate to recent vaccination. If fever, pain, or worse on Monday, please let us know

## 2013-08-29 NOTE — Telephone Encounter (Signed)
Notified Renee with md response...lmb

## 2013-09-01 ENCOUNTER — Other Ambulatory Visit: Payer: Self-pay | Admitting: Internal Medicine

## 2013-09-01 ENCOUNTER — Ambulatory Visit: Payer: Medicare Other | Admitting: General Practice

## 2013-09-04 ENCOUNTER — Encounter (HOSPITAL_BASED_OUTPATIENT_CLINIC_OR_DEPARTMENT_OTHER): Payer: Medicare Other | Attending: Internal Medicine

## 2013-09-04 DIAGNOSIS — L8990 Pressure ulcer of unspecified site, unspecified stage: Secondary | ICD-10-CM | POA: Insufficient documentation

## 2013-09-04 DIAGNOSIS — L89609 Pressure ulcer of unspecified heel, unspecified stage: Secondary | ICD-10-CM | POA: Insufficient documentation

## 2013-09-04 DIAGNOSIS — L89899 Pressure ulcer of other site, unspecified stage: Secondary | ICD-10-CM | POA: Insufficient documentation

## 2013-09-04 DIAGNOSIS — L89309 Pressure ulcer of unspecified buttock, unspecified stage: Secondary | ICD-10-CM | POA: Insufficient documentation

## 2013-09-08 ENCOUNTER — Ambulatory Visit: Payer: Medicare Other | Admitting: General Practice

## 2013-09-15 ENCOUNTER — Ambulatory Visit: Payer: Medicare Other | Admitting: General Practice

## 2013-09-15 LAB — POCT INR: INR: 2.5

## 2013-09-16 ENCOUNTER — Telehealth: Payer: Self-pay | Admitting: *Deleted

## 2013-09-18 ENCOUNTER — Encounter: Payer: Self-pay | Admitting: Internal Medicine

## 2013-09-19 NOTE — Progress Notes (Signed)
[facility]  NAME:  LAURIAN, EDRINGTON               ACCOUNT NO.:  [id number]  MEDICAL RECORD NO.:  [id number]      DATE OF BIRTH:  1956-07-05  PHYSICIAN:  Maxwell Caul, M.D. VISIT DATE:  09/18/2013                                  OFFICE VISIT   CHIEF COMPLAINT:  Continued followup of a deep decubitus pressure ulcer over the left ischium as well as bilateral heel and right leg.  We have been following Ms. Lumpkin for an assortment of pressure wounds initially starting as heel ulcerations.  More recently we have been dealing with an increasingly complicated area over her left buttock. This is a superficial wound on August 07, 2013, however, it is become increasingly necrotic.  The heel ulcer and wound on her right lower leg posteriorly have been doing considerably better.  On examination, skin bilateral heel ulcers having gradually closing down.  These are healthy and have required no specific debridement.  The area on her posterior right leg had exposed tendon in this although this has also close down considerably.  Unfortunately, the area of the buttock ulcer over the left ischium continues to decline.  This underwent an extensive debridement of necrotic subcutaneous tissue.  The wound is probably precariously close to her ischium.  We continued to dress this with Santyl, Hydrogel based dressings as there really is no alternative here.  She has had a previous wound in this area and the scar of this is visible.  She tells me this was treated with a combination of plastic surgery and a wound VAC all managed at home, however, I cannot really see this happening.  I have floated the idea that she may need surgical debridement and perhaps a prolonged period of time in a skilled facility where this area could be properly off-loaded and if the wound VAC was needed at that time that would certainly be the scenario that would be most appropriate.  I will re-discuss  this with her next week.  In the meantime, all the wounds on the lower extremities including her bilateral heels and left posterior leg continue to improve.  All of these were dressed with Endoform and Hydrogel.  We will see her again in a week's time.          ______________________________ Maxwell Caul, M.D.     MGR/MEDQ  D:  09/18/2013  T:  09/19/2013  Job:  [id number]

## 2013-09-22 ENCOUNTER — Ambulatory Visit (INDEPENDENT_AMBULATORY_CARE_PROVIDER_SITE_OTHER): Payer: Medicare Other | Admitting: Family Medicine

## 2013-09-22 LAB — POCT INR: INR: 2.1

## 2013-09-23 ENCOUNTER — Telehealth: Payer: Self-pay | Admitting: *Deleted

## 2013-09-23 NOTE — Telephone Encounter (Signed)
I have not seen this order or request for order. However, I am okay giving verbal order for air mattress as recommended Please call Onalee Hua and followup on the status of this order, okay to get verbal order if accepted thanks

## 2013-09-23 NOTE — Telephone Encounter (Signed)
A user error has taken place.

## 2013-09-23 NOTE — Telephone Encounter (Signed)
Left detailed message on Clinical Coordinators VM requesting a return phone call due to [facility] non receipt of order.

## 2013-09-23 NOTE — Telephone Encounter (Signed)
Pt called requesting a status on a request from the Wound Care Center in reference an Air Mattress due to a Pressure Sore.  Pt states request was to be sent from [name] at the [facility] approx. 5 weeks ago.  David's number is [phone].  Please advise

## 2013-09-25 ENCOUNTER — Inpatient Hospital Stay (HOSPITAL_COMMUNITY)
Admission: AD | Admit: 2013-09-25 | Discharge: 2013-10-03 | DRG: 570 | Disposition: A | Discharge status: Home Health | Payer: Medicare Other | Source: Ambulatory Visit | Attending: Internal Medicine | Admitting: Internal Medicine

## 2013-09-25 ENCOUNTER — Encounter (HOSPITAL_COMMUNITY): Payer: Self-pay

## 2013-09-25 ENCOUNTER — Inpatient Hospital Stay (HOSPITAL_COMMUNITY): Payer: Medicare Other

## 2013-09-25 DIAGNOSIS — L89309 Pressure ulcer of unspecified buttock, unspecified stage: Secondary | ICD-10-CM | POA: Diagnosis present

## 2013-09-25 DIAGNOSIS — L89609 Pressure ulcer of unspecified heel, unspecified stage: Secondary | ICD-10-CM | POA: Diagnosis present

## 2013-09-25 DIAGNOSIS — I959 Hypotension, unspecified: Secondary | ICD-10-CM

## 2013-09-25 DIAGNOSIS — L89892 Pressure ulcer of other site, stage 2: Secondary | ICD-10-CM

## 2013-09-25 DIAGNOSIS — IMO0001 Reserved for inherently not codable concepts without codable children: Secondary | ICD-10-CM | POA: Diagnosis present

## 2013-09-25 DIAGNOSIS — G8250 Quadriplegia, unspecified: Secondary | ICD-10-CM

## 2013-09-25 DIAGNOSIS — E1169 Type 2 diabetes mellitus with other specified complication: Secondary | ICD-10-CM

## 2013-09-25 DIAGNOSIS — Z6836 Body mass index (BMI) 36.0-36.9, adult: Secondary | ICD-10-CM

## 2013-09-25 DIAGNOSIS — M869 Osteomyelitis, unspecified: Secondary | ICD-10-CM

## 2013-09-25 DIAGNOSIS — L89151 Pressure ulcer of sacral region, stage 1: Secondary | ICD-10-CM | POA: Diagnosis present

## 2013-09-25 DIAGNOSIS — I82409 Acute embolism and thrombosis of unspecified deep veins of unspecified lower extremity: Secondary | ICD-10-CM

## 2013-09-25 DIAGNOSIS — Z936 Other artificial openings of urinary tract status: Secondary | ICD-10-CM

## 2013-09-25 DIAGNOSIS — L8994 Pressure ulcer of unspecified site, stage 4: Secondary | ICD-10-CM | POA: Diagnosis present

## 2013-09-25 DIAGNOSIS — L89109 Pressure ulcer of unspecified part of back, unspecified stage: Principal | ICD-10-CM | POA: Diagnosis present

## 2013-09-25 DIAGNOSIS — Z113 Encounter for screening for infections with a predominantly sexual mode of transmission: Secondary | ICD-10-CM

## 2013-09-25 DIAGNOSIS — B961 Klebsiella pneumoniae [K. pneumoniae] as the cause of diseases classified elsewhere: Secondary | ICD-10-CM | POA: Diagnosis present

## 2013-09-25 DIAGNOSIS — M810 Age-related osteoporosis without current pathological fracture: Secondary | ICD-10-CM

## 2013-09-25 DIAGNOSIS — L89154 Pressure ulcer of sacral region, stage 4: Secondary | ICD-10-CM

## 2013-09-25 DIAGNOSIS — Z5181 Encounter for therapeutic drug level monitoring: Secondary | ICD-10-CM

## 2013-09-25 DIAGNOSIS — Z79899 Other long term (current) drug therapy: Secondary | ICD-10-CM

## 2013-09-25 DIAGNOSIS — E876 Hypokalemia: Secondary | ICD-10-CM

## 2013-09-25 DIAGNOSIS — A4902 Methicillin resistant Staphylococcus aureus infection, unspecified site: Secondary | ICD-10-CM

## 2013-09-25 DIAGNOSIS — A498 Other bacterial infections of unspecified site: Secondary | ICD-10-CM

## 2013-09-25 DIAGNOSIS — N319 Neuromuscular dysfunction of bladder, unspecified: Secondary | ICD-10-CM

## 2013-09-25 DIAGNOSIS — E785 Hyperlipidemia, unspecified: Secondary | ICD-10-CM

## 2013-09-25 DIAGNOSIS — Z87440 Personal history of urinary (tract) infections: Secondary | ICD-10-CM

## 2013-09-25 DIAGNOSIS — E559 Vitamin D deficiency, unspecified: Secondary | ICD-10-CM

## 2013-09-25 DIAGNOSIS — Z7901 Long term (current) use of anticoagulants: Secondary | ICD-10-CM

## 2013-09-25 DIAGNOSIS — L0390 Cellulitis, unspecified: Secondary | ICD-10-CM

## 2013-09-25 DIAGNOSIS — L02219 Cutaneous abscess of trunk, unspecified: Secondary | ICD-10-CM | POA: Diagnosis present

## 2013-09-25 DIAGNOSIS — Z86718 Personal history of other venous thrombosis and embolism: Secondary | ICD-10-CM

## 2013-09-25 LAB — CBC
HCT: 32.8 % — ABNORMAL LOW (ref 36.0–46.0)
Hemoglobin: 10.9 g/dL — ABNORMAL LOW (ref 12.0–15.0)
Platelets: 246 10*3/uL (ref 150–400)
RDW: 13.3 % (ref 11.5–15.5)
WBC: 10.6 10*3/uL — ABNORMAL HIGH (ref 4.0–10.5)

## 2013-09-25 LAB — COMPREHENSIVE METABOLIC PANEL
ALT: 18 U/L (ref 0–35)
AST: 27 U/L (ref 0–37)
Albumin: 2.5 g/dL — ABNORMAL LOW (ref 3.5–5.2)
Alkaline Phosphatase: 93 U/L (ref 39–117)
BUN: 6 mg/dL (ref 6–23)
Chloride: 95 mEq/L — ABNORMAL LOW (ref 96–112)
Potassium: 2.8 mEq/L — ABNORMAL LOW (ref 3.5–5.1)
Total Bilirubin: 0.5 mg/dL (ref 0.3–1.2)

## 2013-09-25 LAB — LACTIC ACID, PLASMA: Lactic Acid, Venous: 1 mmol/L (ref 0.5–2.2)

## 2013-09-25 MED ORDER — AMOXICILLIN 250 MG PO CAPS
250.0000 mg | ORAL_CAPSULE | ORAL | Status: DC
  Filled 2013-09-25: qty 1

## 2013-09-25 MED ORDER — LORATADINE 10 MG PO TABS
10.0000 mg | ORAL_TABLET | Freq: Every day | ORAL | Status: DC
  Administered 2013-09-27 – 2013-10-03 (×7): 10 mg via ORAL
  Filled 2013-09-25 (×8): qty 1

## 2013-09-25 MED ORDER — SIMVASTATIN 20 MG PO TABS
20.0000 mg | ORAL_TABLET | Freq: Every day | ORAL | Status: DC
  Administered 2013-09-26: 20 mg via ORAL
  Filled 2013-09-25 (×2): qty 1

## 2013-09-25 MED ORDER — ACETAMINOPHEN 325 MG PO TABS
650.0000 mg | ORAL_TABLET | Freq: Four times a day (QID) | ORAL | Status: DC | PRN
  Administered 2013-09-29: 325 mg via ORAL
  Administered 2013-09-30: 650 mg via ORAL
  Filled 2013-09-25 (×2): qty 2

## 2013-09-25 MED ORDER — SENNA 8.6 MG PO TABS
8.0000 | ORAL_TABLET | ORAL | Status: DC
Start: 2013-09-26 — End: 2013-10-03
  Administered 2013-09-27: 68.8 mg via ORAL
  Filled 2013-09-25: qty 8

## 2013-09-25 MED ORDER — SODIUM CHLORIDE 0.9 % IV SOLN
INTRAVENOUS | Status: DC
  Administered 2013-09-25 – 2013-10-03 (×10): via INTRAVENOUS

## 2013-09-25 MED ORDER — CALCIUM POLYCARBOPHIL 625 MG PO TABS
625.0000 mg | ORAL_TABLET | Freq: Four times a day (QID) | ORAL | Status: DC
  Administered 2013-09-26 (×3): 625 mg via ORAL
  Filled 2013-09-25 (×5): qty 1

## 2013-09-25 MED ORDER — CRANBERRY 400 MG PO CAPS
4200.0000 mg | ORAL_CAPSULE | Freq: Every day | ORAL | Status: DC
Start: 2013-09-25 — End: 2013-09-25

## 2013-09-25 MED ORDER — OLOPATADINE HCL 0.1 % OP SOLN
1.0000 [drp] | Freq: Every day | OPHTHALMIC | Status: DC
  Administered 2013-09-26: 1 [drp] via OPHTHALMIC
  Filled 2013-09-25: qty 5

## 2013-09-25 MED ORDER — ACETAMINOPHEN 650 MG RE SUPP: 650.0000 mg | Freq: Four times a day (QID) | RECTAL | Status: DC | PRN

## 2013-09-25 MED ORDER — ALBUTEROL SULFATE HFA 108 (90 BASE) MCG/ACT IN AERS: 2.0000 | INHALATION_SPRAY | Freq: Four times a day (QID) | RESPIRATORY_TRACT | Status: DC | PRN

## 2013-09-25 MED ORDER — DIAZEPAM 5 MG PO TABS
5.0000 mg | ORAL_TABLET | Freq: Two times a day (BID) | ORAL | Status: DC
  Administered 2013-09-26 – 2013-10-03 (×15): 5 mg via ORAL
  Filled 2013-09-25 (×15): qty 1

## 2013-09-25 MED ORDER — ALPRAZOLAM 0.25 MG PO TABS: 0.2500 mg | ORAL_TABLET | Freq: Every day | ORAL | Status: DC | PRN

## 2013-09-25 MED ORDER — ACETAMINOPHEN-CODEINE #3 300-30 MG PO TABS
1.0000 | ORAL_TABLET | ORAL | Status: DC | PRN
  Administered 2013-09-27 – 2013-10-01 (×4): 1 via ORAL
  Filled 2013-09-25 (×4): qty 1

## 2013-09-25 MED ORDER — BACLOFEN 20 MG PO TABS
20.0000 mg | ORAL_TABLET | Freq: Four times a day (QID) | ORAL | Status: DC
  Administered 2013-09-26 (×3): 20 mg via ORAL
  Filled 2013-09-25 (×5): qty 1

## 2013-09-25 MED ORDER — OXYBUTYNIN CHLORIDE 5 MG PO TABS
5.0000 mg | ORAL_TABLET | Freq: Two times a day (BID) | ORAL | Status: DC
  Administered 2013-09-26 – 2013-10-03 (×15): 5 mg via ORAL
  Filled 2013-09-25 (×17): qty 1

## 2013-09-25 MED ORDER — BISACODYL 10 MG RE SUPP
10.0000 mg | RECTAL | Status: DC
  Administered 2013-09-27: 10 mg via RECTAL
  Filled 2013-09-25: qty 1

## 2013-09-25 NOTE — Progress Notes (Signed)
PHARMACIST - PHYSICIAN ORDER COMMUNICATION  CONCERNING: P&T Medication Policy on Herbal Medications  DESCRIPTION:  This patient's order for:  cranberry has been noted.  This product(s) is classified as an "herbal" or natural product. Due to a lack of definitive safety studies or FDA approval, nonstandard manufacturing practices, plus the potential risk of unknown drug-drug interactions while on inpatient medications, the Pharmacy and Therapeutics Committee does not permit the use of "herbal" or natural products of this type within [facility].   ACTION TAKEN: The pharmacy department is unable to verify this order at this time and your patient has been informed of this safety policy. Please reevaluate patient's clinical condition at discharge and address if the herbal or natural product(s) should be resumed at that time.   Juliette Alcide, PharmD, BCPS.   Pager: [phone] 09/25/2013 .5:54 PM
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
## 2013-09-25 NOTE — Progress Notes (Signed)
Upon administration of patients night time medications patient states "I don't need them, I took them earlier"  Patient said she brought her night time medications with her to the ED because she figured that she would not get them there.  She states she took her medications around 1900.  This included all medications that were to be administered tonight plus she took 3mg  of Coumadin.  Patient does not have any more home medications here with her. Pt educated that our pharmacy will provide all medications from this point on.

## 2013-09-26 ENCOUNTER — Encounter (HOSPITAL_COMMUNITY): Payer: Self-pay | Admitting: Physician Assistant

## 2013-09-26 DIAGNOSIS — L89109 Pressure ulcer of unspecified part of back, unspecified stage: Principal | ICD-10-CM

## 2013-09-26 DIAGNOSIS — L8994 Pressure ulcer of unspecified site, stage 4: Secondary | ICD-10-CM

## 2013-09-26 LAB — CBC
HCT: 31.5 % — ABNORMAL LOW (ref 36.0–46.0)
Hemoglobin: 10.5 g/dL — ABNORMAL LOW (ref 12.0–15.0)
MCV: 90.8 fL (ref 78.0–100.0)
Platelets: 249 10*3/uL (ref 150–400)
RBC: 3.47 MIL/uL — ABNORMAL LOW (ref 3.87–5.11)
WBC: 8.5 10*3/uL (ref 4.0–10.5)

## 2013-09-26 LAB — URINALYSIS, ROUTINE W REFLEX MICROSCOPIC
Bilirubin Urine: NEGATIVE
Ketones, ur: NEGATIVE mg/dL
Nitrite: NEGATIVE
Specific Gravity, Urine: 1.013 (ref 1.005–1.030)
Urobilinogen, UA: 1 mg/dL (ref 0.0–1.0)
pH: 7.5 (ref 5.0–8.0)

## 2013-09-26 LAB — POTASSIUM: Potassium: 3.8 mEq/L (ref 3.5–5.1)

## 2013-09-26 LAB — SEDIMENTATION RATE: Sed Rate: 132 mm/hr — ABNORMAL HIGH (ref 0–22)

## 2013-09-26 LAB — BASIC METABOLIC PANEL
CO2: 29 mEq/L (ref 19–32)
Calcium: 8.6 mg/dL (ref 8.4–10.5)
Chloride: 95 mEq/L — ABNORMAL LOW (ref 96–112)
Creatinine, Ser: 0.5 mg/dL (ref 0.50–1.10)
Glucose, Bld: 98 mg/dL (ref 70–99)

## 2013-09-26 LAB — URINE MICROSCOPIC-ADD ON

## 2013-09-26 LAB — PROTIME-INR: Prothrombin Time: 20.9 seconds — ABNORMAL HIGH (ref 11.6–15.2)

## 2013-09-26 MED ORDER — AMOXICILLIN 250 MG PO CAPS
250.0000 mg | ORAL_CAPSULE | ORAL | Status: DC
  Filled 2013-09-26: qty 1

## 2013-09-26 MED ORDER — OLOPATADINE HCL 0.1 % OP SOLN
1.0000 [drp] | Freq: Two times a day (BID) | OPHTHALMIC | Status: DC
  Administered 2013-09-26 – 2013-10-03 (×16): 1 [drp] via OPHTHALMIC
  Filled 2013-09-26: qty 5

## 2013-09-26 MED ORDER — ZINC SULFATE 220 (50 ZN) MG PO CAPS
220.0000 mg | ORAL_CAPSULE | Freq: Every day | ORAL | Status: DC
  Administered 2013-09-27 – 2013-10-03 (×7): 220 mg via ORAL
  Filled 2013-09-26 (×8): qty 1

## 2013-09-26 MED ORDER — SIMVASTATIN 20 MG PO TABS
20.0000 mg | ORAL_TABLET | Freq: Every day | ORAL | Status: DC
  Administered 2013-09-27 – 2013-10-02 (×6): 20 mg via ORAL
  Filled 2013-09-26 (×8): qty 1

## 2013-09-26 MED ORDER — CALCIUM POLYCARBOPHIL 625 MG PO TABS
625.0000 mg | ORAL_TABLET | Freq: Four times a day (QID) | ORAL | Status: DC
  Administered 2013-09-26 – 2013-10-03 (×27): 625 mg via ORAL
  Filled 2013-09-26 (×29): qty 1

## 2013-09-26 MED ORDER — OLOPATADINE HCL 0.1 % OP SOLN
1.0000 [drp] | Freq: Two times a day (BID) | OPHTHALMIC | Status: DC
  Filled 2013-09-26: qty 5

## 2013-09-26 MED ORDER — UNJURY VANILLA POWDER
8.0000 [oz_av] | Freq: Every day | ORAL | Status: DC
  Administered 2013-09-28 – 2013-10-01 (×4): 8 [oz_av] via ORAL
  Filled 2013-09-26 (×6): qty 27

## 2013-09-26 MED ORDER — UNJURY CHOCOLATE CLASSIC POWDER
8.0000 [oz_av] | Freq: Every day | ORAL | Status: DC
  Administered 2013-09-28 – 2013-09-30 (×3): 8 [oz_av] via ORAL
  Filled 2013-09-26 (×6): qty 27

## 2013-09-26 MED ORDER — VITAMIN C 500 MG PO TABS
500.0000 mg | ORAL_TABLET | Freq: Every day | ORAL | Status: DC
  Administered 2013-09-27 – 2013-10-03 (×7): 500 mg via ORAL
  Filled 2013-09-26 (×8): qty 1

## 2013-09-26 MED ORDER — HEPARIN (PORCINE) IN NACL 100-0.45 UNIT/ML-% IJ SOLN
1200.0000 [IU]/h | INTRAMUSCULAR | Status: DC
  Administered 2013-09-26: 1200 [IU]/h via INTRAVENOUS
  Filled 2013-09-26: qty 250

## 2013-09-26 MED ORDER — BACLOFEN 20 MG PO TABS
20.0000 mg | ORAL_TABLET | Freq: Four times a day (QID) | ORAL | Status: DC
  Administered 2013-09-26 – 2013-10-03 (×27): 20 mg via ORAL
  Filled 2013-09-26 (×29): qty 1

## 2013-09-26 MED ORDER — POTASSIUM CHLORIDE 20 MEQ/15ML (10%) PO LIQD
40.0000 meq | Freq: Once | ORAL | Status: AC
  Administered 2013-09-26: 40 meq via ORAL
  Filled 2013-09-26: qty 30

## 2013-09-26 MED ORDER — POTASSIUM CHLORIDE CRYS ER 20 MEQ PO TBCR
40.0000 meq | EXTENDED_RELEASE_TABLET | Freq: Once | ORAL | Status: AC
  Administered 2013-09-26: 40 meq via ORAL
  Filled 2013-09-26: qty 2

## 2013-09-26 MED ORDER — ADULT MULTIVITAMIN W/MINERALS CH
1.0000 | ORAL_TABLET | Freq: Every day | ORAL | Status: DC
  Administered 2013-09-27 – 2013-10-03 (×7): 1 via ORAL
  Filled 2013-09-26 (×8): qty 1

## 2013-09-26 NOTE — Progress Notes (Cosign Needed)
[facility]  NAME:  STAPHANIE, HARBISON               ACCOUNT NO.:  [id number]  MEDICAL RECORD NO.:  [id number]      DATE OF BIRTH:  [birth year]  PHYSICIAN:  Maxwell Caul, M.D.      VISIT DATE:                                  OFFICE VISIT   [name] is a 57 year old woman who is a long standing woman with quadriplegia.  She had been followed for several months in this clinic for bilateral decubitus ulcers on her posterior heels and an area on her left posterior leg with exposed tendon.  From this extent, both of these wound areas have been making gradual improvements.  Starting at the beginning of October, she was noted to have a pressure area on her left buttock over the ischial tuberosity.  This was initially superficial, but has become progressively worse.  Last week, this presented with necrotic tissue and underwent an extensive debridement.  Today, this has more necrotic tissue, foul odor, and to the inferior recess of the wound is a firm area with some degree of surface erythema.  She is not systemically unwell.  PHYSICAL EXAMINATION:  Skin, the area over the bilateral heels is stable from last week.  There continues to be gradual closure of the wound on the left posterior leg.  To these areas, we have been applying collagen Hydrogel based dressings with a Kerlix and net wrap.  The real concern here is the left buttock which is a large and stageable wound.  There is an area that probes inferiorly here, although I could not express any overt purulent drainage.  The area is firm and erythematous and could well represent either necrotic tissue and/or serious soft tissue infection.  IMPRESSION:  Deteriorating decubitus ulcer over the left ischial tuberosity.  I believe this lady needs to be admitted to the hospital. She will need an MRI of this area.  Empiric antibiotics and possible surgical consultation with wound care (Dr. Kelly Splinter).  The patient  lives in her own home.  She has extensive in-home support but it is not 24/7. She spends a large amount of each day perhaps 5-6 hours in her wheelchair.  This will not allow healing of this area either now or when she leaves the hospital.  I have floated the idea of a skilled admission to her, she is extremely reluctant about this.          ______________________________ Maxwell Caul, M.D.     MGR/MEDQ  D:  09/25/2013  T:  09/26/2013  Job:  [id number]
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
## 2013-09-26 NOTE — Consult Note (Signed)
WOC wound consult note Reason for Consult: Pressure ulcer to sacrum/Ischial tuberosity.  Will defer consult as patient is pending surgical evaluation with Dr Kelly Splinter, per MD note and pending MRI. Bedside nurse notified.   WOC nursing team will not follow at this time.  Please re-consult if needed.  Maple Hudson RN BSN [facility] Pager [phone]
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
## 2013-09-26 NOTE — Progress Notes (Signed)
CRITICAL VALUE ALERT  Critical value received:  K+ 2.4  Date of notification: 10/24  Time of notification:  0632  Critical value read back: yes  Nurse who received alert:  Jassmine Vandruff RN  MD notified (1st page):  Kirtland Bouchard Schorr  Time of first page:  [phone]  MD notified (2nd page):  Time of second page:  Responding MD:   Time MD responded:
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
## 2013-09-26 NOTE — Progress Notes (Signed)
Have discussed at length with pt about her home medication regimen and current medications. Pt questioning at length about why she is not ordered her home dose of bumex. Called and spoke with pharmacy who states that the med rec form was addressed at adminission and MD specifically did not order Bumex at that time. Pt also recently hypokalemic per labwork. Pt dose state that after she was admitted last night, she took her home dosage of bumex that she had brought with the to the hospital with her. Pt's output is adequate and she is not experiencing any crackles or ShOB per my assessment at this time. Will continue to monitor pt's condition and will call the on-call MD as needed if emergent change occurs.

## 2013-09-27 ENCOUNTER — Other Ambulatory Visit: Payer: Self-pay | Admitting: Plastic Surgery

## 2013-09-27 DIAGNOSIS — L89154 Pressure ulcer of sacral region, stage 4: Secondary | ICD-10-CM

## 2013-09-27 LAB — CBC
Platelets: 228 10*3/uL (ref 150–400)
RDW: 13.8 % (ref 11.5–15.5)
WBC: 6.9 10*3/uL (ref 4.0–10.5)

## 2013-09-27 LAB — BASIC METABOLIC PANEL
Chloride: 105 mEq/L (ref 96–112)
GFR calc Af Amer: >90 mL/min (ref >90)
Potassium: 3.6 mEq/L (ref 3.5–5.1)
Sodium: 139 mEq/L (ref 135–145)

## 2013-09-27 LAB — URINE CULTURE: Colony Count: >=100000

## 2013-09-27 LAB — HEPARIN LEVEL (UNFRACTIONATED)
Heparin Unfractionated: 0.13 IU/mL — ABNORMAL LOW (ref 0.30–0.70)
Heparin Unfractionated: 0.32 IU/mL (ref 0.30–0.70)

## 2013-09-27 MED ORDER — DAKINS (1/2 STRENGTH) 0.25 % EX SOLN
Freq: Two times a day (BID) | CUTANEOUS | Status: DC
  Administered 2013-09-28: 01:00:00
  Filled 2013-09-27: qty 473

## 2013-09-27 MED ORDER — HEPARIN (PORCINE) IN NACL 100-0.45 UNIT/ML-% IJ SOLN
1400.0000 [IU]/h | INTRAMUSCULAR | Status: AC
  Administered 2013-09-27: 1400 [IU]/h via INTRAVENOUS
  Filled 2013-09-27 (×2): qty 250

## 2013-09-27 MED ORDER — NON FORMULARY: Freq: Two times a day (BID) | Status: DC

## 2013-09-27 NOTE — Anesthesia Preprocedure Evaluation (Addendum)
Anesthesia Evaluation  Patient identified by MRN, date of birth, ID band Patient awake    Reviewed: Allergy & Precautions, H&P , NPO status , Patient's Chart, lab work & pertinent test results  Airway Mallampati: II TM Distance: >3 FB Neck ROM: full    Dental no notable dental hx. (+) Teeth Intact and Dental Advisory Given   Pulmonary neg pulmonary ROS,  breath sounds clear to auscultation  Pulmonary exam normal       Cardiovascular Exercise Tolerance: Good negative cardio ROS  Rhythm:regular Rate:Normal     Neuro/Psych Tetraplegia. Neurogenic bladder negative neurological ROS  negative psych ROS   GI/Hepatic negative GI ROS, Neg liver ROS,   Endo/Other  negative endocrine ROS  Renal/GU negative Renal ROS  negative genitourinary   Musculoskeletal   Abdominal   Peds  Hematology negative hematology ROS (+) Blood dyscrasia, anemia , hgb 9.9   Anesthesia Other Findings   Reproductive/Obstetrics negative OB ROS                          Anesthesia Physical Anesthesia Plan  ASA: III  Anesthesia Plan: General   Post-op Pain Management:    Induction: Intravenous  Airway Management Planned: Oral ETT  Additional Equipment:   Intra-op Plan:   Post-operative Plan: Extubation in OR  Informed Consent: I have reviewed the patients History and Physical, chart, labs and discussed the procedure including the risks, benefits and alternatives for the proposed anesthesia with the patient or authorized representative who has indicated his/her understanding and acceptance.   Dental Advisory Given  Plan Discussed with: CRNA and Surgeon  Anesthesia Plan Comments:         Anesthesia Quick Evaluation

## 2013-09-27 NOTE — Progress Notes (Signed)
Pt refused dsg change; pt explained she did not want to have a dsg change until she had her bowel prep at 2200 this evening.  Pt made aware dsg changes had been ordered BID.

## 2013-09-27 NOTE — Progress Notes (Signed)
ANTICOAGULATION CONSULT NOTE - Follow Up Consult  Pharmacy Consult for Heparin Indication: Hx of DVT, chronic warfarin held  Allergies  Allergen Reactions  . Latex    Patient Measurements:   Heparin Dosing Weight: 76 kg  Vital Signs: Temp: 98.6 F (37 C) (10/25 0614) Temp src: Oral (10/25 0614) BP: 119/62 mmHg (10/25 0614) Pulse Rate: 77 (10/25 0614)  Labs:  Recent Labs  09/25/13 1835 09/26/13 0455 09/27/13 0337 09/27/13 1212  HGB 10.9* 10.5* 9.9*  --   HCT 32.8* 31.5* 30.3*  --   PLT 246 249 228  --   LABPROT  --  20.9*  --   --   INR  --  1.86*  --   --   HEPARINUNFRC  --   --  0.13* 0.32  CREATININE 0.43* 0.50 0.31*  --    The CrCl is unknown because both a height and weight (above a minimum accepted value) are required for this calculation.  Medications:  Scheduled:  . baclofen  20 mg Oral QID  . bisacodyl  10 mg Rectal Weekly  . diazepam  5 mg Oral BID  . loratadine  10 mg Oral Daily  . multivitamin with minerals  1 tablet Oral Daily  . olopatadine  1 drop Both Eyes BID  . oxybutynin  5 mg Oral BID  . polycarbophil  625 mg Oral QID  . protein supplement  8 oz Oral Q1400  . protein supplement  8 oz Oral Daily  . senna  8 tablet Oral Weekly  . simvastatin  20 mg Oral QHS  . sodium hypochlorite   Irrigation BID  . vitamin C  500 mg Oral Daily  . zinc sulfate  220 mg Oral Daily   Infusions:  . sodium chloride 75 mL/hr at 09/27/13 0326  . heparin 1,400 Units/hr (09/27/13 0442)   Assessment: 12 yoF with multiple decubitus ulcers: sacral, heels, calves [facility], on MRI sacral cellulitis, osteomyelitis and myositis. Admit for tissue biopsy to guide care, then plan to begin empiric Vancomycin/Ceftriaxone. Chronic Warfarin complicating therapy, held on admission (INR 1.86)- Heparin infusion begun 10/24  Heparin begun at 1200 units/hr, first level 0.13 units/ml - below therapeutic range. Heparin rate increased to 1400 units/hr, follow up  level 0.32 units/ml  Plan to hold Heparin at tonight for possible procedure tomorrow, order has been changed  Goal of Therapy:  Heparin level 0.3-0.7 units/ml Monitor platelets by anticoagulation protocol: Yes   Plan:   Continue Heparin at 1400 units/hr till 2400, hold for procedure 10/26  Daily Heparin level, CBC  Ordered PT/INR x1 for am 10/26  Otho Bellows PharmD Pager [phone] 09/27/2013, 1:15 PM

## 2013-09-27 NOTE — Progress Notes (Signed)
[facility] PROGRESS NOTE  [name] RUE:[id number] DOB: 06/18/1956 DOA: 09/25/2013 PCP: Rene Paci, MD  HPI/Subjective: - with many many complaints about her wound care, bowel regimen, surgery, the tray in the room "needs to be redesigned", past medical experiences, etc. - otherwise feeling well, states that anything above 74 F is fever for her. She is eating well.  Assessment/Plan: Stage 4 sacral decubitus wound infection - MRI shows extensive cellulitis and osteomyelitis as well as myositis. - surgery initially planned for Wednesday, however would like to avoid holding antibiotics that long. I talked to IR this morning for biopsy, they do not do biopsies over the weekend and the earliest would be on Monday morning. Discussed with Dr. Kelly Splinter and will try to set up surgery tomorrow, hopefully. Hold antibiotics however little threshold to start if she shows any signs of sepsis. This morning afebrile, HR 77, normotensive. WBC 6.9. - appreciate ID input as well.  - NPO past MN, hold heparin after MN H/o DVT in 94 -on lifelong anticoagulation, hold coumadin, in the event of requiring surgical debridement. INR < 2 now, holding heparin pending surgical evaluation.  - on heparin gtt, hold tonight since may go to surgery in am Quadriplegia/related complications/neurogenic bladder/SPC  - continue home dose of laxatives, antispasmodics, chronic meds Hypokalemia - replete  Diet: regular Fluids: NS 75 cc/h DVT Prophylaxis: heparin   Code Status: Full Family Communication: none  Disposition Plan: inpatient  Consultants:  ID  Plastic surgery  Procedures:  none   Antibiotics - none  Objective: Filed Vitals:   09/26/13 0700 09/26/13 1300 09/26/13 2131 09/27/13 0614  BP:  110/73 109/70 119/62  Pulse:  79 87 77  Temp:   98.9 F (37.2 C) 98.6 F (37 C)  TempSrc:   Oral Oral  Resp: 16 20 22 20   SpO2:  96% 98% 97%    Intake/Output Summary (Last 24 hours) at  09/27/13 1102 Last data filed at 09/27/13 0600  Gross per 24 hour  Intake 1858.45 ml  Output   2050 ml  Net -191.55 ml   Exam:  General:  NAD  Cardiovascular: regular rate and rhythm, without MRG  Respiratory: good air movement, clear to auscultation throughout, no wheezing, ronchi or rales  Abdomen: soft, BS +  MSK: no peripheral edema  Neuro: quadriplegic  Data Reviewed: Basic Metabolic Panel:  Recent Labs Lab 09/25/13 1835 09/26/13 0455 09/26/13 1602 09/27/13 0337  NA 136 136  --  139  K 2.8* 2.4* 3.8 3.6  CL 95* 95*  --  105  CO2 28 29  --  25  GLUCOSE 147* 98  --  95  BUN 6 6  --  4*  CREATININE 0.43* 0.50  --  0.31*  CALCIUM 9.4 8.6  --  8.7   Liver Function Tests:  Recent Labs Lab 09/25/13 1835  AST 27  ALT 18  ALKPHOS 93  BILITOT 0.5  PROT 7.9  ALBUMIN 2.5*   CBC:  Recent Labs Lab 09/25/13 1835 09/26/13 0455 09/27/13 0337  WBC 10.6* 8.5 6.9  HGB 10.9* 10.5* 9.9*  HCT 32.8* 31.5* 30.3*  MCV 89.9 90.8 91.3  PLT 246 249 228   Recent Results (from the past 240 hour(s))  MRSA PCR SCREENING     Status: None   Collection Time    09/25/13  8:11 PM      Result Value Range Status   MRSA by PCR NEGATIVE  NEGATIVE Final   Comment:  The GeneXpert MRSA Assay (FDA     approved for NASAL specimens     only), is one component of a     comprehensive MRSA colonization     surveillance program. It is not     intended to diagnose MRSA     infection nor to guide or     monitor treatment for     MRSA infections.    Studies: Mr Pelvis Wo Contrast  09/26/2013   CLINICAL DATA:  Quadriplegic patient with a sacral decubitus ulcer. Question abscess.  EXAM: MRI PELVIS WITHOUT CONTRAST  TECHNIQUE: Multiplanar, multisequence MR imaging was performed. No intravenous contrast was administered.  COMPARISON:  None.  FINDINGS: There is a large decubitus ulcer over the left ischium with intense edema in surrounding fatty tissues. Intense marrow edema is  also identified in the left ischial tuberosity consistent with osteomyelitis. No discrete abscess is seen on this noncontrast examination. All visualized musculature is markedly atrophic. There is edema in the adductor brevis and magnus muscles and in the gracilis consistent with myositis. No focal fluid collection within muscle is identified.  A small right hip joint effusion is identified. There is no marrow signal abnormality about the right hip. The sacroiliac joints and symphysis pubis are unremarkable. A tiny amount of fluid is seen about the iliacus muscles bilaterally, worse on the left. This is nonspecific but unlikely to be due to infection. Imaged intrapelvic contents are unremarkable.  IMPRESSION: Large decubitus ulcer of the left ischium with intense underlying cellulitis and osteomyelitis in the ischial tuberosity. Edema in the adductor brevis and magnus and gracilis muscles compatible with myositis is identified. No abscess is seen.   Electronically Signed   By: Drusilla Kanner M.D.   On: 09/26/2013 09:41   Scheduled Meds: . baclofen  20 mg Oral QID  . bisacodyl  10 mg Rectal Weekly  . diazepam  5 mg Oral BID  . loratadine  10 mg Oral Daily  . multivitamin with minerals  1 tablet Oral Daily  . olopatadine  1 drop Both Eyes BID  . oxybutynin  5 mg Oral BID  . polycarbophil  625 mg Oral QID  . protein supplement  8 oz Oral Q1400  . protein supplement  8 oz Oral Daily  . senna  8 tablet Oral Weekly  . simvastatin  20 mg Oral QHS  . sodium hypochlorite   Irrigation BID  . vitamin C  500 mg Oral Daily  . zinc sulfate  220 mg Oral Daily   Continuous Infusions: . sodium chloride 75 mL/hr at 09/27/13 0326  . heparin 1,400 Units/hr (09/27/13 0442)   Active Problems:   Tetraplegia   Neurogenic bladder   DVT (deep venous thrombosis)   Decubitus ulcer of sacral region, stage 4  Time spent: 35  Pamella Pert, MD [facility] Pager [phone]. If 7 PM - 7 AM, please contact  night-coverage at [email or web address], password [facility] 09/27/2013, 11:02 AM  LOS: 2 days
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
## 2013-09-28 ENCOUNTER — Encounter (HOSPITAL_COMMUNITY): Payer: Self-pay | Admitting: Plastic Surgery

## 2013-09-28 ENCOUNTER — Encounter (HOSPITAL_COMMUNITY): Payer: Medicare Other | Admitting: Anesthesiology

## 2013-09-28 ENCOUNTER — Inpatient Hospital Stay (HOSPITAL_COMMUNITY): Payer: Medicare Other | Admitting: Anesthesiology

## 2013-09-28 ENCOUNTER — Encounter (HOSPITAL_COMMUNITY)
Admission: AD | Disposition: A | Discharge status: Home Health | Payer: Self-pay | Source: Ambulatory Visit | Attending: Internal Medicine

## 2013-09-28 HISTORY — PX: INCISION AND DRAINAGE OF WOUND: SHX1803

## 2013-09-28 LAB — PROTIME-INR: Prothrombin Time: 20.7 seconds — ABNORMAL HIGH (ref 11.6–15.2)

## 2013-09-28 LAB — CBC
HCT: 28.9 % — ABNORMAL LOW (ref 36.0–46.0)
Hemoglobin: 9.3 g/dL — ABNORMAL LOW (ref 12.0–15.0)
MCH: 29.8 pg (ref 26.0–34.0)
Platelets: 238 10*3/uL (ref 150–400)
RBC: 3.12 MIL/uL — ABNORMAL LOW (ref 3.87–5.11)

## 2013-09-28 SURGERY — IRRIGATION AND DEBRIDEMENT WOUND
Anesthesia: General | Site: Buttocks | Wound class: Dirty or Infected

## 2013-09-28 MED ORDER — LACTATED RINGERS IV SOLN: INTRAVENOUS | Status: DC

## 2013-09-28 MED ORDER — VANCOMYCIN HCL IN DEXTROSE 1-5 GM/200ML-% IV SOLN
1000.0000 mg | Freq: Two times a day (BID) | INTRAVENOUS | Status: DC
  Filled 2013-09-28 (×3): qty 200

## 2013-09-28 MED ORDER — 0.9 % SODIUM CHLORIDE (POUR BTL) OPTIME
TOPICAL | Status: DC | PRN
  Administered 2013-09-28: 1000 mL

## 2013-09-28 MED ORDER — NEOMYCIN-POLYMYXIN B GU 40-200000 IR SOLN
Status: DC
  Administered 2013-09-29: 12:00:00
  Administered 2013-10-01: 30 mL
  Filled 2013-09-28: qty 1

## 2013-09-28 MED ORDER — DEXTROSE 5 % IV SOLN
2.0000 g | Freq: Once | INTRAVENOUS | Status: AC
  Administered 2013-09-28: 2 g via INTRAVENOUS
  Filled 2013-09-28: qty 2

## 2013-09-28 MED ORDER — TRAZODONE HCL 50 MG PO TABS
50.0000 mg | ORAL_TABLET | Freq: Every evening | ORAL | Status: DC | PRN
  Administered 2013-09-29: 50 mg via ORAL
  Filled 2013-09-28: qty 1

## 2013-09-28 MED ORDER — PROPOFOL 10 MG/ML IV BOLUS
INTRAVENOUS | Status: DC | PRN
  Administered 2013-09-28: 120 mg via INTRAVENOUS

## 2013-09-28 MED ORDER — HEPARIN (PORCINE) IN NACL 100-0.45 UNIT/ML-% IJ SOLN
1400.0000 [IU]/h | INTRAMUSCULAR | Status: DC
  Filled 2013-09-28: qty 250

## 2013-09-28 MED ORDER — FENTANYL CITRATE 0.05 MG/ML IJ SOLN: 25.0000 ug | INTRAMUSCULAR | Status: DC | PRN

## 2013-09-28 MED ORDER — SODIUM CHLORIDE 0.9 % IV BOLUS (SEPSIS)
500.0000 mL | Freq: Once | INTRAVENOUS | Status: AC
  Administered 2013-09-28: 500 mL via INTRAVENOUS

## 2013-09-28 MED ORDER — LACTATED RINGERS IV SOLN
INTRAVENOUS | Status: DC | PRN
  Administered 2013-09-28: 08:00:00 via INTRAVENOUS

## 2013-09-28 MED ORDER — DEXTROSE 5 % IV SOLN
2.0000 g | INTRAVENOUS | Status: DC
  Administered 2013-09-29 – 2013-09-30 (×2): 2 g via INTRAVENOUS
  Filled 2013-09-28 (×3): qty 2

## 2013-09-28 MED ORDER — BACITRACIN-NEOMYCIN-POLYMYXIN 400-5-5000 EX OINT
TOPICAL_OINTMENT | CUTANEOUS | Status: AC
  Filled 2013-09-28: qty 1

## 2013-09-28 MED ORDER — VANCOMYCIN HCL IN DEXTROSE 1-5 GM/200ML-% IV SOLN
1000.0000 mg | INTRAVENOUS | Status: AC
  Administered 2013-09-28: 1000 mg via INTRAVENOUS
  Filled 2013-09-28: qty 200

## 2013-09-28 MED ORDER — LIDOCAINE HCL (CARDIAC) 20 MG/ML IV SOLN
INTRAVENOUS | Status: DC | PRN
  Administered 2013-09-28: 100 mg via INTRAVENOUS

## 2013-09-28 SURGICAL SUPPLY — 24 items
BANDAGE GAUZE ELAST BULKY 4 IN (GAUZE/BANDAGES/DRESSINGS) ×2 IMPLANT
BLADE HEX COATED 2.75 (ELECTRODE) ×3 IMPLANT
CANISTER SUCTION 2500CC (MISCELLANEOUS) ×2 IMPLANT
CLOTH BEACON ORANGE TIMEOUT ST (SAFETY) ×2 IMPLANT
COVER SURGICAL LIGHT HANDLE (MISCELLANEOUS) ×2 IMPLANT
DECANTER SPIKE VIAL GLASS SM (MISCELLANEOUS) IMPLANT
DRAPE LAPAROTOMY T 102X78X121 (DRAPES) ×2 IMPLANT
DRAPE UTILITY XL STRL (DRAPES) ×2 IMPLANT
DRSG PAD ABDOMINAL 8X10 ST (GAUZE/BANDAGES/DRESSINGS) ×6 IMPLANT
ELECT REM PT RETURN 9FT ADLT (ELECTROSURGICAL) ×2
ELECTRODE REM PT RTRN 9FT ADLT (ELECTROSURGICAL) ×1 IMPLANT
GOWN PREVENTION PLUS LG XLONG (DISPOSABLE) ×4 IMPLANT
HANDPIECE INTERPULSE COAX TIP (DISPOSABLE) ×2
KIT BASIN OR (CUSTOM PROCEDURE TRAY) ×2 IMPLANT
NEEDLE HYPO 22GX1.5 SAFETY (NEEDLE) IMPLANT
NS IRRIG 1000ML POUR BTL (IV SOLUTION) ×2 IMPLANT
PACK GENERAL/GYN (CUSTOM PROCEDURE TRAY) ×2 IMPLANT
PAD ABD 7.5X8 STRL (GAUZE/BANDAGES/DRESSINGS) ×1 IMPLANT
SET HNDPC FAN SPRY TIP SCT (DISPOSABLE) ×1 IMPLANT
SPONGE GAUZE 4X4 12PLY (GAUZE/BANDAGES/DRESSINGS) ×3 IMPLANT
STAPLER VISISTAT 35W (STAPLE) ×2 IMPLANT
SYR CONTROL 10ML LL (SYRINGE) IMPLANT
TAPE CLOTH SURG 6X10 WHT LF (GAUZE/BANDAGES/DRESSINGS) ×1 IMPLANT
TOWEL OR 17X26 10 PK STRL BLUE (TOWEL DISPOSABLE) ×2 IMPLANT

## 2013-09-28 NOTE — Progress Notes (Signed)
ANTICOAGULATION CONSULT NOTE - Follow Up Consult  Pharmacy Consult for Heparin Indication: Hx of DVT, chronic warfarin held  Allergies  Allergen Reactions  . Latex    Patient Measurements: Height: 5\' 4"  (162.6 cm) (per patient upon questioned) Weight: 195 lb (88.451 kg) (per patient upon questioned) IBW/kg (Calculated) : 54.7 Heparin Dosing Weight: 76 kg  Vital Signs: Temp: 97.5 F (36.4 C) (10/26 0950) Temp src: Oral (10/26 0950) BP: 133/75 mmHg (10/26 0950) Pulse Rate: 68 (10/26 0950)  Labs:  Recent Labs  09/25/13 1835 09/26/13 0455 09/27/13 0337 09/27/13 1212 09/28/13 0515  HGB 10.9* 10.5* 9.9*  --  9.3*  HCT 32.8* 31.5* 30.3*  --  28.9*  PLT 246 249 228  --  238  LABPROT  --  20.9*  --   --  20.7*  INR  --  1.86*  --   --  1.84*  HEPARINUNFRC  --   --  0.13* 0.32 <0.10*  CREATININE 0.43* 0.50 0.31*  --   --    Estimated Creatinine Clearance: 82.5 ml/min (by C-G formula based on Cr of 0.31).  Medications:  Scheduled:  . baclofen  20 mg Oral QID  . bisacodyl  10 mg Rectal Weekly  . cefTRIAXone (ROCEPHIN)  IV  2 g Intravenous Once  . [START ON 09/29/2013] cefTRIAXone (ROCEPHIN)  IV  2 g Intravenous Q24H  . diazepam  5 mg Oral BID  . loratadine  10 mg Oral Daily  . multivitamin with minerals  1 tablet Oral Daily  . [START ON 09/29/2013] neomycin-polymyxin B   Irrigation 3 times weekly  . olopatadine  1 drop Both Eyes BID  . oxybutynin  5 mg Oral BID  . polycarbophil  625 mg Oral QID  . protein supplement  8 oz Oral Q1400  . protein supplement  8 oz Oral Daily  . senna  8 tablet Oral Weekly  . simvastatin  20 mg Oral QHS  . sodium hypochlorite   Irrigation BID  . vancomycin  1,000 mg Intravenous NOW  . [START ON 09/29/2013] vancomycin  1,000 mg Intravenous Q12H  . vitamin C  500 mg Oral Daily  . zinc sulfate  220 mg Oral Daily   Infusions:  . sodium chloride 75 mL/hr at 09/28/13 0924  . heparin     Assessment: 84 yoF with multiple decubitus ulcers:  sacral, heels, calves [facility] worse [facility], on MRI sacral cellulitis, osteomyelitis and myositis. Admit for tissue biopsy to guide care, then plan to begin empiric Vancomycin/Ceftriaxone. Chronic Warfarin complicating therapy, held on admission (INR 1.86)- Heparin infusion begun 10/24  Heparin begun 10/24 at 1200 units/hr, first level 0.13 units/ml - below therapeutic range. Heparin rate increased to 1400 units/hr, follow up level 0.32 units/ml on 10/25  Heparin held at last night, I&D, bone and tissue samples obtained for culture today  Resume Heparin today at 1800, rate 1400 units/hr  Resume Warfarin tomorrow per MD note, protocol for Pharmacy to dose  Goal of Therapy:  Heparin level 0.3-0.7 units/ml Monitor platelets by anticoagulation protocol: Yes INR 2-3   Plan:   Resume Heparin at 1400 units/hr at 1800 today  Heparin level at 2400 for resumption of Heparin infusion  Daily Heparin level, Protime/INR,CBC  Resume Warfarin tomorrow 10/27  Otho Bellows PharmD Pager [phone] 09/28/2013, 11:19 AM
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
## 2013-09-28 NOTE — Consult Note (Signed)
Have seen the patient and agree with the above information.
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
## 2013-09-28 NOTE — Anesthesia Postprocedure Evaluation (Signed)
  Anesthesia Post-op Note  Patient: [name]  Procedure(s) Performed: Procedure(s) (LRB): IRRIGATION AND DEBRIDEMENT WOUND (N/A)  Patient Location: PACU  Anesthesia Type: General  Level of Consciousness: awake and alert   Airway and Oxygen Therapy: Patient Spontanous Breathing  Post-op Pain: mild  Post-op Assessment: Post-op Vital signs reviewed, Patient's Cardiovascular Status Stable, Respiratory Function Stable, Patent Airway and No signs of Nausea or vomiting  Last Vitals:  Filed Vitals:   09/28/13 0915  BP: 159/66  Pulse:   Temp: 36.4 C  Resp:     Post-op Vital Signs: stable   Complications: No apparent anesthesia complications

## 2013-09-28 NOTE — Op Note (Signed)
Operative Note   DATE OF OPERATION: 09/28/2013  SURGICAL DIVISION: Plastic Surgery  PREOPERATIVE DIAGNOSES:  left sacral / ischial ulcer   POSTOPERATIVE DIAGNOSES:  same  PROCEDURE:  Excision of sacral / ischial ulcer 3 x 3 x 2 cm  SURGEON: [employer], DO  ASSISTANT: Shawn Rayburn, PA  ANESTHESIA:  General.   COMPLICATIONS: None.   INDICATIONS FOR PROCEDURE:  The patient, [name], is a 57 y.o. female born on 1956-11-24, is here for treatment of sacral/ischial ulcer   CONSENT:  Informed consent was obtained directly from the patient. Risks, benefits and alternatives were fully discussed. Specific risks including but not limited to bleeding, infection, hematoma, seroma, scarring, pain, asymmetry, wound healing problems, and need for further surgery were all discussed. The patient did have an ample opportunity to have questions answered to satisfaction.   DESCRIPTION OF PROCEDURE:  The patient was taken to the operating room. SCDs were placed and IV antibiotics were given. The patient's operative site was prepped and draped in a sterile fashion. A time out was performed and all information was confirmed to be correct.  The patient was placed in the prone position.  The ulcer was debrided sharply with a #10 blade.  Hemostasis was achieved with electrocautery.  The bone was not exposed but there was discoloration right over the bone.  A small incision was made over the bone and a small amount of yellow fluid was drained.  A bone biopsy was excised and sent to microbiology.  The wound was packed with Kerlex wet to dry.  An ABD was placed over the area. The patient was allowed to wake from anesthesia, extubated and taken to the recovery room in satisfactory condition.

## 2013-09-28 NOTE — Brief Op Note (Signed)
09/25/2013 - 09/28/2013  8:48 AM  PATIENT:  [name]  57 y.o. female  PRE-OPERATIVE DIAGNOSIS:  sacral ulcer  POST-OPERATIVE DIAGNOSIS:  sacral ulcer  PROCEDURE:  Procedure(s): IRRIGATION AND DEBRIDEMENT WOUND (N/A)  SURGEON:  Surgeon(s) and Role:    * Weronika Birch Sanger, DO - Primary  PHYSICIAN ASSISTANT: none  ASSISTANTS: none   ANESTHESIA:   general  EBL:  Total I/O In: 500 [I.V.:500] Out: -   BLOOD ADMINISTERED:none  DRAINS: none   LOCAL MEDICATIONS USED:  NONE  SPECIMEN:  Source of Specimen:  sacral ulcer and sacral/ischial bone  DISPOSITION OF SPECIMEN:  micro  COUNTS:  YES  TOURNIQUET:  * No tourniquets in log *  DICTATION: .Dragon Dictation  PLAN OF CARE: Admit to inpatient   PATIENT DISPOSITION:  PACU - hemodynamically stable.   Delay start of Pharmacological VTE agent (>24hrs) due to surgical blood loss or risk of bleeding: no

## 2013-09-28 NOTE — Progress Notes (Signed)
[facility] PROGRESS NOTE  RAYVN RICKERSON ZOX:[id number] DOB: Sep 25, 1956 DOA: 09/25/2013 PCP: Rene Paci, MD  HPI/Subjective: - bit better this morning, she is awaiting surgery  Assessment/Plan: Stage 4 sacral decubitus wound infection - MRI shows extensive cellulitis and osteomyelitis as well as myositis. - s/p OR this morning, debridement and biopsies sent - will start Vancomycin and Ceftriaxone after surgery per ID recommendation. Plan PICC line tomorrow.  - restart heparin tonight and coumadin tomorrow.  H/o DVT in 94 -on lifelong anticoagulation Quadriplegia/related complications/neurogenic bladder/SPC  - continue home dose of laxatives, antispasmodics, chronic meds Hypokalemia - replete  Diet: regular Fluids: NS 75 cc/h DVT Prophylaxis: heparin   Code Status: Full Family Communication: none  Disposition Plan: inpatient  Consultants:  ID  Plastic surgery  Procedures:  none   Antibiotics Vancomycin 10/26 >> Ceftriaxone 10/26 >>  Objective: Filed Vitals:   09/27/13 1400 09/27/13 1615 09/27/13 2220 09/28/13 0613  BP: 132/68  136/82 95/62  Pulse: 67  70 75  Temp: 97.1 F (36.2 C) 98.5 F (36.9 C) 98 F (36.7 C) 97.9 F (36.6 C)  TempSrc: Oral Oral Oral Oral  Resp: 18  18 18   SpO2: 94%  96% 98%    Intake/Output Summary (Last 24 hours) at 09/28/13 0729 Last data filed at 09/28/13 0500  Gross per 24 hour  Intake   1068 ml  Output   2401 ml  Net  -1333 ml   Exam:  General:  NAD  Cardiovascular: regular rate and rhythm, without MRG  Respiratory: good air movement, clear to auscultation throughout, no wheezing, ronchi or rales  Abdomen: soft, BS +  MSK: no peripheral edema  Neuro: quadriplegic  Data Reviewed: Basic Metabolic Panel:  Recent Labs Lab 09/25/13 1835 09/26/13 0455 09/26/13 1602 09/27/13 0337  NA 136 136  --  139  K 2.8* 2.4* 3.8 3.6  CL 95* 95*  --  105  CO2 28 29  --  25  GLUCOSE 147* 98  --  95  BUN 6 6   --  4*  CREATININE 0.43* 0.50  --  0.31*  CALCIUM 9.4 8.6  --  8.7   Liver Function Tests:  Recent Labs Lab 09/25/13 1835  AST 27  ALT 18  ALKPHOS 93  BILITOT 0.5  PROT 7.9  ALBUMIN 2.5*   CBC:  Recent Labs Lab 09/25/13 1835 09/26/13 0455 09/27/13 0337 09/28/13 0515  WBC 10.6* 8.5 6.9 5.9  HGB 10.9* 10.5* 9.9* 9.3*  HCT 32.8* 31.5* 30.3* 28.9*  MCV 89.9 90.8 91.3 92.6  PLT 246 249 228 238   Recent Results (from the past 240 hour(s))  MRSA PCR SCREENING     Status: None   Collection Time    09/25/13  8:11 PM      Result Value Range Status   MRSA by PCR NEGATIVE  NEGATIVE Final   Comment:            The GeneXpert MRSA Assay (FDA     approved for NASAL specimens     only), is one component of a     comprehensive MRSA colonization     surveillance program. It is not     intended to diagnose MRSA     infection nor to guide or     monitor treatment for     MRSA infections.  URINE CULTURE     Status: None   Collection Time    09/26/13  3:26 PM      Result  Value Range Status   Specimen Description URINE, CLEAN CATCH   Final   Special Requests NONE   Final   Culture  Setup Time     Final   Value: 09/26/2013 20:46     Performed at [facility] Count     Final   Value: >=100,000 COLONIES/ML     Performed at [facility]   Culture     Final   Value: Multiple bacterial morphotypes present, none predominant. Suggest appropriate recollection if clinically indicated.     Performed at [facility]   Report Status 09/27/2013 FINAL   Final    Studies: No results found. Scheduled Meds: . baclofen  20 mg Oral QID  . bisacodyl  10 mg Rectal Weekly  . diazepam  5 mg Oral BID  . loratadine  10 mg Oral Daily  . multivitamin with minerals  1 tablet Oral Daily  . olopatadine  1 drop Both Eyes BID  . oxybutynin  5 mg Oral BID  . polycarbophil  625 mg Oral QID  . protein supplement  8 oz Oral Q1400  . protein supplement  8 oz Oral Daily   . senna  8 tablet Oral Weekly  . simvastatin  20 mg Oral QHS  . sodium hypochlorite   Irrigation BID  . vitamin C  500 mg Oral Daily  . zinc sulfate  220 mg Oral Daily   Continuous Infusions: . sodium chloride 75 mL/hr at 09/28/13 0616  . lactated ringers     Active Problems:   Tetraplegia   Neurogenic bladder   DVT (deep venous thrombosis)   Decubitus ulcer of sacral region, stage 4  Time spent: 35  Pamella Pert, MD [facility] Pager [phone]. If 7 PM - 7 AM, please contact night-coverage at [email or web address], password [facility] 09/28/2013, 7:29 AM  LOS: 3 days

## 2013-09-28 NOTE — Transfer of Care (Signed)
Immediate Anesthesia Transfer of Care Note  Patient: [name]  Procedure(s) Performed: Procedure(s): IRRIGATION AND DEBRIDEMENT WOUND (N/A)  Patient Location: PACU  Anesthesia Type:General  Level of Consciousness: sedated  Airway & Oxygen Therapy: Patient Spontanous Breathing and Patient connected to face mask oxygen  Post-op Assessment: Report given to PACU RN and Post -op Vital signs reviewed and stable  Post vital signs: Reviewed and stable  Complications: No apparent anesthesia complications

## 2013-09-29 ENCOUNTER — Telehealth: Payer: Self-pay | Admitting: Internal Medicine

## 2013-09-29 ENCOUNTER — Other Ambulatory Visit: Payer: Self-pay | Admitting: Internal Medicine

## 2013-09-29 ENCOUNTER — Encounter (HOSPITAL_COMMUNITY): Payer: Self-pay | Admitting: Plastic Surgery

## 2013-09-29 DIAGNOSIS — L8990 Pressure ulcer of unspecified site, unspecified stage: Secondary | ICD-10-CM

## 2013-09-29 DIAGNOSIS — M869 Osteomyelitis, unspecified: Secondary | ICD-10-CM

## 2013-09-29 DIAGNOSIS — R509 Fever, unspecified: Secondary | ICD-10-CM

## 2013-09-29 LAB — CBC
Hemoglobin: 8.8 g/dL — ABNORMAL LOW (ref 12.0–15.0)
MCH: 30.7 pg (ref 26.0–34.0)
MCHC: 33.1 g/dL (ref 30.0–36.0)
MCV: 92.7 fL (ref 78.0–100.0)
RBC: 2.87 MIL/uL — ABNORMAL LOW (ref 3.87–5.11)

## 2013-09-29 LAB — PROTIME-INR
INR: 1.79 — ABNORMAL HIGH (ref 0.00–1.49)
Prothrombin Time: 20.3 seconds — ABNORMAL HIGH (ref 11.6–15.2)

## 2013-09-29 LAB — BASIC METABOLIC PANEL
CO2: 24 mEq/L (ref 19–32)
Calcium: 8.2 mg/dL — ABNORMAL LOW (ref 8.4–10.5)
Chloride: 101 mEq/L (ref 96–112)
Creatinine, Ser: 0.38 mg/dL — ABNORMAL LOW (ref 0.50–1.10)
GFR calc non Af Amer: >90 mL/min (ref >90)
Glucose, Bld: 153 mg/dL — ABNORMAL HIGH (ref 70–99)

## 2013-09-29 LAB — HEPARIN LEVEL (UNFRACTIONATED): Heparin Unfractionated: 0.33 IU/mL (ref 0.30–0.70)

## 2013-09-29 MED ORDER — NEOMYCIN-POLYMYXIN B GU 40-200000 IR SOLN: Status: DC

## 2013-09-29 MED ORDER — VANCOMYCIN HCL IN DEXTROSE 1-5 GM/200ML-% IV SOLN
1000.0000 mg | Freq: Two times a day (BID) | INTRAVENOUS | Status: DC
  Administered 2013-09-29 – 2013-10-03 (×9): 1000 mg via INTRAVENOUS
  Filled 2013-09-29 (×9): qty 200

## 2013-09-29 MED ORDER — WARFARIN - PHARMACIST DOSING INPATIENT: Freq: Every day | Status: DC

## 2013-09-29 MED ORDER — ENOXAPARIN SODIUM 100 MG/ML ~~LOC~~ SOLN
90.0000 mg | Freq: Two times a day (BID) | SUBCUTANEOUS | Status: DC
  Administered 2013-09-29 – 2013-10-03 (×9): 90 mg via SUBCUTANEOUS
  Filled 2013-09-29 (×10): qty 1

## 2013-09-29 MED ORDER — WARFARIN SODIUM 4 MG PO TABS
4.0000 mg | ORAL_TABLET | Freq: Once | ORAL | Status: AC
  Administered 2013-09-29: 4 mg via ORAL
  Filled 2013-09-29: qty 1

## 2013-09-29 MED ORDER — HEPARIN (PORCINE) IN NACL 100-0.45 UNIT/ML-% IJ SOLN
1550.0000 [IU]/h | INTRAMUSCULAR | Status: AC
  Administered 2013-09-29: 1550 [IU]/h via INTRAVENOUS
  Filled 2013-09-29 (×2): qty 250

## 2013-09-29 NOTE — Progress Notes (Signed)
ANTICOAGULATION CONSULT NOTE - Follow Up Consult  Pharmacy Consult for Heparin Indication: Hx of DVT, chronic warfarin held  Allergies  Allergen Reactions  . Latex    Patient Measurements: Height: 5\' 4"  (162.6 cm) (per patient upon questioned) Weight: 195 lb (88.451 kg) (per patient upon questioned) IBW/kg (Calculated) : 54.7   Vital Signs: Temp: 101.5 F (38.6 C) (10/27 0546) Temp src: Oral (10/27 0546) BP: 95/57 mmHg (10/27 0546) Pulse Rate: 93 (10/27 0546)  Labs:  Recent Labs  09/27/13 0337  09/28/13 0515 09/29/13 0005 09/29/13 0817  HGB 9.9*  --  9.3*  --  8.8*  HCT 30.3*  --  28.9*  --  26.6*  PLT 228  --  238  --  246  LABPROT  --   --  20.7*  --  20.3*  INR  --   --  1.84*  --  1.79*  HEPARINUNFRC 0.13*  < > <0.10* 0.22* 0.33  CREATININE 0.31*  --   --   --  0.38*  < > = values in this interval not displayed. Estimated Creatinine Clearance: 82.5 ml/min (by C-G formula based on Cr of 0.38).  Medications:  Scheduled:  . baclofen  20 mg Oral QID  . bisacodyl  10 mg Rectal Weekly  . cefTRIAXone (ROCEPHIN)  IV  2 g Intravenous Q24H  . diazepam  5 mg Oral BID  . loratadine  10 mg Oral Daily  . multivitamin with minerals  1 tablet Oral Daily  . neomycin-polymyxin B   Irrigation 3 times weekly  . olopatadine  1 drop Both Eyes BID  . oxybutynin  5 mg Oral BID  . polycarbophil  625 mg Oral QID  . protein supplement  8 oz Oral Q1400  . protein supplement  8 oz Oral Daily  . senna  8 tablet Oral Weekly  . simvastatin  20 mg Oral QHS  . sodium hypochlorite   Irrigation BID  . vancomycin  1,000 mg Intravenous Q12H  . vitamin C  500 mg Oral Daily  . zinc sulfate  220 mg Oral Daily   Infusions:  . sodium chloride 75 mL/hr at 09/29/13 0148  . heparin 1,550 Units/hr (09/29/13 0149)   Assessment: 53 yoF with multiple decubitus ulcers: sacral, heels, calves appeared worse [facility], on MRI sacral cellulitis, osteomyelitis and myositis. Admitted for tissue  biopsy to guide care then empiric antibiotics. Chronic warfarin held on admission, heparin infusion begun 10/24  Heparin level therapeutic (0.33) on 1550 units/hr.  Hgb drifting down.   No overt bleeding reported.  Pltc stable.  Goal of Therapy:  Heparin level 0.3-0.7 units/ml Monitor platelets by anticoagulation protocol: Yes    Plan:   Continue heparin at 1550 units/hr.  Daily heparin level, CBC.  Await orders for when to resume warfarin and change heparin to Lovenox.   Elie Goody, PharmD, BCPS Pager: [phone] 09/29/2013  9:30 AM

## 2013-09-29 NOTE — Telephone Encounter (Signed)
Notified Renee with md response...lmb

## 2013-09-29 NOTE — Progress Notes (Addendum)
[facility] PROGRESS NOTE  [name] YQM:[id number] DOB: [birth year] DOA: 09/25/2013 PCP: Rene Paci, MD  HPI/Subjective: - feels better, denies chills  Assessment/Plan: Stage 4 sacral decubitus wound infection - MRI shows extensive cellulitis and osteomyelitis as well as myositis. - s/p OR 10/26, debridement and biopsies sent - started Vancomycin and Ceftriaxone after surgery per ID recommendation. - wait for am CBC, if without significant drop start Lovenox/Coumadin and d/c heparin gtt Fever - s/p debridement, likely due to #1. Monitor.  - hold PICC placement given fever H/o DVT in 94 -on lifelong anticoagulation Quadriplegia/related complications/neurogenic bladder/SPC  - continue home dose of laxatives, antispasmodics, chronic meds Hypokalemia - replete  Diet: regular Fluids: NS 75 cc/h DVT Prophylaxis: heparin   Code Status: Full Family Communication: none  Disposition Plan: inpatient  Consultants:  ID  Plastic surgery  Procedures:  none   Antibiotics Vancomycin 10/26 >> Ceftriaxone 10/26 >>  Objective: Filed Vitals:   09/28/13 1425 09/28/13 1800 09/28/13 2130 09/29/13 0546  BP: 87/56 115/62 105/65 95/57  Pulse: 69  93 93  Temp: 97.5 F (36.4 C)  99.1 F (37.3 C) 101.5 F (38.6 C)  TempSrc:   Oral Oral  Resp: 16  18 18   Height:      Weight:      SpO2: 97%  97% 94%    Intake/Output Summary (Last 24 hours) at 09/29/13 0742 Last data filed at 09/29/13 5284  Gross per 24 hour  Intake 4468.16 ml  Output   1175 ml  Net 3293.16 ml   Exam:  General:  NAD  Cardiovascular: regular rate and rhythm, without MRG  Respiratory: good air movement, clear to auscultation throughout, no wheezing, ronchi or rales  Abdomen: soft, BS +  MSK: no peripheral edema  Neuro: quadriplegic  Data Reviewed: Basic Metabolic Panel:  Recent Labs Lab 09/25/13 1835 09/26/13 0455 09/26/13 1602 09/27/13 0337  NA 136 136  --  139  K 2.8* 2.4*  3.8 3.6  CL 95* 95*  --  105  CO2 28 29  --  25  GLUCOSE 147* 98  --  95  BUN 6 6  --  4*  CREATININE 0.43* 0.50  --  0.31*  CALCIUM 9.4 8.6  --  8.7   Liver Function Tests:  Recent Labs Lab 09/25/13 1835  AST 27  ALT 18  ALKPHOS 93  BILITOT 0.5  PROT 7.9  ALBUMIN 2.5*   CBC:  Recent Labs Lab 09/25/13 1835 09/26/13 0455 09/27/13 0337 09/28/13 0515  WBC 10.6* 8.5 6.9 5.9  HGB 10.9* 10.5* 9.9* 9.3*  HCT 32.8* 31.5* 30.3* 28.9*  MCV 89.9 90.8 91.3 92.6  PLT 246 249 228 238   Recent Results (from the past 240 hour(s))  MRSA PCR SCREENING     Status: None   Collection Time    09/25/13  8:11 PM      Result Value Range Status   MRSA by PCR NEGATIVE  NEGATIVE Final   Comment:            The GeneXpert MRSA Assay (FDA     approved for NASAL specimens     only), is one component of a     comprehensive MRSA colonization     surveillance program. It is not     intended to diagnose MRSA     infection nor to guide or     monitor treatment for     MRSA infections.  URINE CULTURE     Status:  None   Collection Time    09/26/13  3:26 PM      Result Value Range Status   Specimen Description URINE, CLEAN CATCH   Final   Special Requests NONE   Final   Culture  Setup Time     Final   Value: 09/26/2013 20:46     Performed at [facility] Count     Final   Value: >=100,000 COLONIES/ML     Performed at [facility]   Culture     Final   Value: Multiple bacterial morphotypes present, none predominant. Suggest appropriate recollection if clinically indicated.     Performed at [facility]   Report Status 09/27/2013 FINAL   Final  TISSUE CULTURE     Status: None   Collection Time    09/28/13  8:32 AM      Result Value Range Status   Specimen Description ULCER SACRAL   Final   Special Requests NONE   Final   Gram Stain PENDING   Incomplete   Culture     Final   Value: Culture reincubated for better growth     Performed at [facility]   Report Status PENDING   Incomplete  TISSUE CULTURE     Status: None   Collection Time    09/28/13  8:33 AM      Result Value Range Status   Specimen Description BONE SACRAL   Final   Special Requests NONE   Final   Gram Stain     Final   Value: FEW WBC PRESENT, PREDOMINANTLY PMN     NO SQUAMOUS EPITHELIAL CELLS SEEN     NO ORGANISMS SEEN     Performed at [facility]   Culture     Final   Value: NO GROWTH     Performed at [facility]   Report Status PENDING   Incomplete    Studies: No results found. Scheduled Meds: . baclofen  20 mg Oral QID  . bisacodyl  10 mg Rectal Weekly  . cefTRIAXone (ROCEPHIN)  IV  2 g Intravenous Q24H  . diazepam  5 mg Oral BID  . loratadine  10 mg Oral Daily  . multivitamin with minerals  1 tablet Oral Daily  . neomycin-polymyxin B   Irrigation 3 times weekly  . olopatadine  1 drop Both Eyes BID  . oxybutynin  5 mg Oral BID  . polycarbophil  625 mg Oral QID  . protein supplement  8 oz Oral Q1400  . protein supplement  8 oz Oral Daily  . senna  8 tablet Oral Weekly  . simvastatin  20 mg Oral QHS  . sodium hypochlorite   Irrigation BID  . vancomycin  1,000 mg Intravenous Q12H  . vitamin C  500 mg Oral Daily  . zinc sulfate  220 mg Oral Daily   Continuous Infusions: . sodium chloride 75 mL/hr at 09/29/13 0148  . heparin 1,550 Units/hr (09/29/13 0149)   Active Problems:   Tetraplegia   Neurogenic bladder   DVT (deep venous thrombosis)   Decubitus ulcer of sacral region, stage 4  Time spent: 25  Pamella Pert, MD [facility] Pager [phone]. If 7 PM - 7 AM, please contact night-coverage at [email or web address], password [facility] 09/29/2013, 7:42 AM  LOS: 4 days

## 2013-09-29 NOTE — Telephone Encounter (Signed)
Please call Renee on my behalf -  As PCP, I am getting copies of all hospital procedures for this pt while she is IP -  She will be cared for the hospitalists and surgery team while IP - Please let me know how i can help after DC with [facility] needs as they arise Thanks!

## 2013-09-29 NOTE — Progress Notes (Signed)
ANTICOAGULATION CONSULT NOTE - Follow Up Consult  Pharmacy Consult for Heparin Indication: Hx of DVT, chronic warfarin held  Allergies  Allergen Reactions  . Latex    Patient Measurements: Height: 5\' 4"  (162.6 cm) (per patient upon questioned) Weight: 195 lb (88.451 kg) (per patient upon questioned) IBW/kg (Calculated) : 54.7 Heparin Dosing Weight: 76 kg  Vital Signs: Temp: 99.1 F (37.3 C) (10/26 2130) Temp src: Oral (10/26 2130) BP: 105/65 mmHg (10/26 2130) Pulse Rate: 93 (10/26 2130)  Labs:  Recent Labs  09/26/13 0455  09/27/13 0337 09/27/13 1212 09/28/13 0515 09/29/13 0005  HGB 10.5*  --  9.9*  --  9.3*  --   HCT 31.5*  --  30.3*  --  28.9*  --   PLT 249  --  228  --  238  --   LABPROT 20.9*  --   --   --  20.7*  --   INR 1.86*  --   --   --  1.84*  --   HEPARINUNFRC  --   < > 0.13* 0.32 <0.10* 0.22*  CREATININE 0.50  --  0.31*  --   --   --   < > = values in this interval not displayed. Estimated Creatinine Clearance: 82.5 ml/min (by C-G formula based on Cr of 0.31).  Medications:  Scheduled:  . baclofen  20 mg Oral QID  . bisacodyl  10 mg Rectal Weekly  . cefTRIAXone (ROCEPHIN)  IV  2 g Intravenous Q24H  . diazepam  5 mg Oral BID  . loratadine  10 mg Oral Daily  . multivitamin with minerals  1 tablet Oral Daily  . neomycin-polymyxin B   Irrigation 3 times weekly  . olopatadine  1 drop Both Eyes BID  . oxybutynin  5 mg Oral BID  . polycarbophil  625 mg Oral QID  . protein supplement  8 oz Oral Q1400  . protein supplement  8 oz Oral Daily  . senna  8 tablet Oral Weekly  . simvastatin  20 mg Oral QHS  . sodium hypochlorite   Irrigation BID  . vancomycin  1,000 mg Intravenous Q12H  . vitamin C  500 mg Oral Daily  . zinc sulfate  220 mg Oral Daily   Infusions:  . sodium chloride 75 mL/hr at 09/28/13 0924  . heparin 1,400 Units/hr (09/28/13 1821)   Assessment: 33 yoF with multiple decubitus ulcers: sacral, heels, calves appeared worse at [facility], on MRI sacral cellulitis, osteomyelitis and myositis. Admit for tissue biopsy to guide care, then plan to begin empiric Vancomycin/Ceftriaxone. Chronic Warfarin complicating therapy, held on admission (INR 1.86)- Heparin infusion begun 10/24  Heparin begun 10/24 at 1200 units/hr, first level 0.13 units/ml - below therapeutic range. Heparin rate increased to 1400 units/hr, follow up level 0.32 units/ml on 10/25  Heparin held at last night, I&D, bone and tissue samples obtained for culture today  Resume Heparin today at 1800, rate 1400 units/hr.  6hr HL = 0.22 (subtherapeutic)  No complications of therapy noted  Resume Warfarin tomorrow per MD note, protocol for Pharmacy to dose  Goal of Therapy:  Heparin level 0.3-0.7 units/ml Monitor platelets by anticoagulation protocol: Yes INR 2-3   Plan:   Increase Heparin to 1550 units/hr    Check heparin level 6 hrs after rate change   Daily Heparin level, Protime/INR,CBC  Resume Warfarin 10/27 per MD note  Terrilee Files, PharmD  09/29/2013, 1:24 AM

## 2013-09-29 NOTE — Progress Notes (Signed)
ANTICOAGULATION CONSULT NOTE - Follow Up Consult  Pharmacy Consult for Lovenox, warfarin Indication: Hx of DVT, chronic warfarin held  Allergies  Allergen Reactions  . Latex    Patient Measurements: Height: 5\' 4"  (162.6 cm) (per patient upon questioned) Weight: 195 lb (88.451 kg) (per patient upon questioned) IBW/kg (Calculated) : 54.7   Vital Signs: Temp: 101.3 F (38.5 C) (10/27 1003) Temp src: Oral (10/27 1003) BP: 95/57 mmHg (10/27 0546) Pulse Rate: 93 (10/27 0546)  Labs:  Recent Labs  09/27/13 0337  09/28/13 0515 09/29/13 0005 09/29/13 0817  HGB 9.9*  --  9.3*  --  8.8*  HCT 30.3*  --  28.9*  --  26.6*  PLT 228  --  238  --  246  LABPROT  --   --  20.7*  --  20.3*  INR  --   --  1.84*  --  1.79*  HEPARINUNFRC 0.13*  < > <0.10* 0.22* 0.33  CREATININE 0.31*  --   --   --  0.38*  < > = values in this interval not displayed. Estimated Creatinine Clearance: 82.5 ml/min (by C-G formula based on Cr of 0.38).  Medications:  Scheduled:  . baclofen  20 mg Oral QID  . bisacodyl  10 mg Rectal Weekly  . cefTRIAXone (ROCEPHIN)  IV  2 g Intravenous Q24H  . diazepam  5 mg Oral BID  . enoxaparin (LOVENOX) injection  90 mg Subcutaneous Q12H  . loratadine  10 mg Oral Daily  . multivitamin with minerals  1 tablet Oral Daily  . neomycin-polymyxin B   Irrigation 3 times weekly  . olopatadine  1 drop Both Eyes BID  . oxybutynin  5 mg Oral BID  . polycarbophil  625 mg Oral QID  . protein supplement  8 oz Oral Q1400  . protein supplement  8 oz Oral Daily  . senna  8 tablet Oral Weekly  . simvastatin  20 mg Oral QHS  . sodium hypochlorite   Irrigation BID  . vancomycin  1,000 mg Intravenous Q12H  . vitamin C  500 mg Oral Daily  . zinc sulfate  220 mg Oral Daily   Infusions:  . sodium chloride 75 mL/hr at 09/29/13 0148  . heparin 1,550 Units/hr (09/29/13 0149)   Assessment: 27 yoF with multiple decubitus ulcers: sacral, heels, calves appeared worse at [facility], on MRI  sacral cellulitis, osteomyelitis and myositis. Admitted for tissue biopsy to guide care then empiric antibiotics. Chronic warfarin held on admission, heparin infusion begun 10/24  Heparin level therapeutic (0.33) on 1550 units/hr.  Hgb drifting down.   No overt bleeding reported.  Pltc stable.  Orders received to switch IV heparin to SQ Lovenox and resume warfarin today.  PTA warfarin dosage reported as 3 mg PO daily.  INR subtherapeutic but some warfarin effect is still apparent.  Goal of Therapy:  INR 2-3 Monitor platelets by anticoagulation protocol: Yes    Plan:   Stop heparin infusion.  One hour later, begin Lovenox 90 mg (1mg /kg) SQ q12h.  Warfarin 4 mg PO x 1 today at 6pm  PT/INR daily   Elie Goody, PharmD, BCPS Pager: [phone] 09/29/2013  10:37 AM

## 2013-09-29 NOTE — Telephone Encounter (Signed)
09/29/2013  Renee called in to inform Dr. Felicity Coyer that pt had been transferred to [facility], room 1504.  Pt had been referred to hospital by [facility] in regards to a ulcer on left buttock.  Please contact Renee back at [phone]

## 2013-09-29 NOTE — Progress Notes (Signed)
Patient ID: [name], female   DOB: [birth year], 57 y.o.   MRN: [id number]         [facility] for Infectious Disease    Date of Admission:  09/25/2013           Day 2 vancomycin        Day 2 ceftriaxone   Subjective: She was unaware of fever last night.  Objective: Temp:  [97.5 F (36.4 C)-101.5 F (38.6 C)] 100.5 F (38.1 C) (10/27 1336) Pulse Rate:  [69-94] 94 (10/27 1336) Resp:  [16-20] 20 (10/27 1336) BP: (87-115)/(52-65) 92/52 mmHg (10/27 1336) SpO2:  [94 %-97 %] 95 % (10/27 1336)  General: She is alert but somewhat confused. She appears to be upset with her nurses. Skin: No rash. IV sites normal. Lungs: Clear Cor: Regular S1 and S2 no murmurs Abdomen: Soft. No diarrhea recorded Sacral wound not examined  Lab Results Lab Results  Component Value Date   WBC 6.4 09/29/2013   HGB 8.8* 09/29/2013   HCT 26.6* 09/29/2013   MCV 92.7 09/29/2013   PLT 246 09/29/2013    Lab Results  Component Value Date   CREATININE 0.38* 09/29/2013   BUN 7 09/29/2013   NA 134* 09/29/2013   K 3.6 09/29/2013   CL 101 09/29/2013   CO2 24 09/29/2013    Lab Results  Component Value Date   ALT 18 09/25/2013   AST 27 09/25/2013   ALKPHOS 93 09/25/2013   BILITOT 0.5 09/25/2013      Microbiology: Recent Results (from the past 240 hour(s))  MRSA PCR SCREENING     Status: None   Collection Time    09/25/13  8:11 PM      Result Value Range Status   MRSA by PCR NEGATIVE  NEGATIVE Final   Comment:            The GeneXpert MRSA Assay (FDA     approved for NASAL specimens     only), is one component of a     comprehensive MRSA colonization     surveillance program. It is not     intended to diagnose MRSA     infection nor to guide or     monitor treatment for     MRSA infections.  URINE CULTURE     Status: None   Collection Time    09/26/13  3:26 PM      Result Value Range Status   Specimen Description URINE, CLEAN CATCH   Final   Special Requests NONE   Final   Culture  Setup Time     Final   Value: 09/26/2013 20:46     Performed at [facility] Count     Final   Value: >=100,000 COLONIES/ML     Performed at [facility]   Culture     Final   Value: Multiple bacterial morphotypes present, none predominant. Suggest appropriate recollection if clinically indicated.     Performed at [facility]   Report Status 09/27/2013 FINAL   Final  TISSUE CULTURE     Status: None   Collection Time    09/28/13  8:32 AM      Result Value Range Status   Specimen Description ULCER SACRAL   Final   Special Requests NONE   Final   Gram Stain PENDING   Incomplete   Culture     Final   Value: Culture reincubated for better growth  Performed at [facility]   Report Status PENDING   Incomplete  TISSUE CULTURE     Status: None   Collection Time    09/28/13  8:33 AM      Result Value Range Status   Specimen Description BONE SACRAL   Final   Special Requests NONE   Final   Gram Stain     Final   Value: FEW WBC PRESENT, PREDOMINANTLY PMN     NO SQUAMOUS EPITHELIAL CELLS SEEN     NO ORGANISMS SEEN     Performed at [facility]   Culture     Final   Value: NO GROWTH     Performed at [facility]   Report Status PENDING   Incomplete    Studies/Results: No results found.  Assessment: She has sacral osteomyelitis complicating her sacral decubitus. Tissue stain showed no organisms and the cultures negative at 24 hours. I suspect her fever was triggered by the debridement yesterday. I see no other obvious source for fever. I agree with waiting on PICC placement until she is afebrile.  Plan: 1. Continue vancomycin and ceftriaxone 2. Await results of tissue and blood cultures 3. Hold off on PICC placement for now  Cliffton Asters, MD [facility] for Infectious Disease [facility] [phone] pager   [phone] cell 09/29/2013, 1:41 PM

## 2013-09-29 NOTE — Progress Notes (Signed)
Met with pt at bedside to discuss home IV antibiotics. Pt lives alone but has HH services through [facility]. She stated her sister and daughter can be taught how to give the medication.   I spoke with Cala Bradford at [facility], she stated she has to run this by the [employer] company to seek approval before she can know if they can provide the services. She also has to check to see if they have enough staffing to cover this. I will follow up with her tomorrow.  Algernon Huxley, RN BSN  [phone]

## 2013-09-29 NOTE — Progress Notes (Signed)
Per pharmacy vancocin and heparin can not run into the same PIV line.  Per pharmacy pt will have to have another line for vancocin.  IV team paged for consult due to poor IV access.

## 2013-09-30 LAB — BASIC METABOLIC PANEL
CO2: 26 mEq/L (ref 19–32)
Chloride: 106 mEq/L (ref 96–112)
Glucose, Bld: 103 mg/dL — ABNORMAL HIGH (ref 70–99)
Potassium: 3.6 mEq/L (ref 3.5–5.1)
Sodium: 139 mEq/L (ref 135–145)

## 2013-09-30 LAB — CBC
HCT: 27.6 % — ABNORMAL LOW (ref 36.0–46.0)
MCV: 93.9 fL (ref 78.0–100.0)
RBC: 2.94 MIL/uL — ABNORMAL LOW (ref 3.87–5.11)
RDW: 14.2 % (ref 11.5–15.5)
WBC: 5.6 10*3/uL (ref 4.0–10.5)

## 2013-09-30 MED ORDER — WARFARIN SODIUM 4 MG PO TABS
4.5000 mg | ORAL_TABLET | Freq: Once | ORAL | Status: AC
  Administered 2013-09-30: 18:00:00 4.5 mg via ORAL
  Filled 2013-09-30: qty 1

## 2013-09-30 MED ORDER — SODIUM CHLORIDE 0.9 % IV BOLUS (SEPSIS)
500.0000 mL | Freq: Once | INTRAVENOUS | Status: AC
  Administered 2013-09-30: 500 mL via INTRAVENOUS

## 2013-09-30 NOTE — Progress Notes (Signed)
[facility] PROGRESS NOTE  [name] WUX:[id number] DOB: [birth year] DOA: 09/25/2013 PCP: Rene Paci, MD  HPI/Subjective: - feels better, denies chills  Assessment/Plan: Stage 4 sacral decubitus wound infection - MRI shows extensive cellulitis and osteomyelitis as well as myositis. - s/p OR 10/26, debridement and biopsies sent - started Vancomycin and Ceftriaxone after surgery per ID recommendation. - cultures without organisms, pending.  Fever - s/p debridement, likely due to #1. Monitor.  - afebrile this morning, if no fever by afternoon will order PICC line. - blood cultures pending H/o DVT in 94 -on lifelong anticoagulation - back on Coumadin and bridging with Lovenox. Hb stable, no bleeding.  Quadriplegia/related complications/neurogenic bladder/SPC  - continue home dose of laxatives, antispasmodics, chronic meds Hypokalemia - replete  Diet: regular Fluids: NS 75 cc/h DVT Prophylaxis: heparin   Code Status: Full Family Communication: none  Disposition Plan: inpatient  Consultants:  ID  Plastic surgery  Procedures:  none   Antibiotics Vancomycin 10/26 >> Ceftriaxone 10/26 >>  Objective: Filed Vitals:   09/29/13 1336 09/29/13 2200 09/30/13 0600 09/30/13 0715  BP: 92/52 90/55 83/56  95/63  Pulse: 94 74 76   Temp: 100.5 F (38.1 C) 99.9 F (37.7 C) 98.5 F (36.9 C)   TempSrc: Oral Oral Oral   Resp: 20 18 18    Height:      Weight:      SpO2: 95% 98% 98%     Intake/Output Summary (Last 24 hours) at 09/30/13 0813 Last data filed at 09/30/13 2536  Gross per 24 hour  Intake 1842.5 ml  Output   4350 ml  Net -2507.5 ml   Exam:  General:  NAD  Cardiovascular: regular rate and rhythm, without MRG  Respiratory: good air movement, clear to auscultation throughout, no wheezing, ronchi or rales  Abdomen: soft, BS +  MSK: no peripheral edema  Neuro: quadriplegic  Data Reviewed: Basic Metabolic Panel:  Recent Labs Lab  09/25/13 1835 09/26/13 0455 09/26/13 1602 09/27/13 0337 09/29/13 0817 09/30/13 0535  NA 136 136  --  139 134* 139  K 2.8* 2.4* 3.8 3.6 3.6 3.6  CL 95* 95*  --  105 101 106  CO2 28 29  --  25 24 26   GLUCOSE 147* 98  --  95 153* 103*  BUN 6 6  --  4* 7 11  CREATININE 0.43* 0.50  --  0.31* 0.38* 0.39*  CALCIUM 9.4 8.6  --  8.7 8.2* 8.7   Liver Function Tests:  Recent Labs Lab 09/25/13 1835  AST 27  ALT 18  ALKPHOS 93  BILITOT 0.5  PROT 7.9  ALBUMIN 2.5*   CBC:  Recent Labs Lab 09/26/13 0455 09/27/13 0337 09/28/13 0515 09/29/13 0817 09/30/13 0535  WBC 8.5 6.9 5.9 6.4 5.6  HGB 10.5* 9.9* 9.3* 8.8* 8.8*  HCT 31.5* 30.3* 28.9* 26.6* 27.6*  MCV 90.8 91.3 92.6 92.7 93.9  PLT 249 228 238 246 219   Recent Results (from the past 240 hour(s))  MRSA PCR SCREENING     Status: None   Collection Time    09/25/13  8:11 PM      Result Value Range Status   MRSA by PCR NEGATIVE  NEGATIVE Final   Comment:            The GeneXpert MRSA Assay (FDA     approved for NASAL specimens     only), is one component of a     comprehensive MRSA colonization     surveillance  program. It is not     intended to diagnose MRSA     infection nor to guide or     monitor treatment for     MRSA infections.  URINE CULTURE     Status: None   Collection Time    09/26/13  3:26 PM      Result Value Range Status   Specimen Description URINE, CLEAN CATCH   Final   Special Requests NONE   Final   Culture  Setup Time     Final   Value: 09/26/2013 20:46     Performed at [facility] Count     Final   Value: >=100,000 COLONIES/ML     Performed at [facility]   Culture     Final   Value: Multiple bacterial morphotypes present, none predominant. Suggest appropriate recollection if clinically indicated.     Performed at [facility]   Report Status 09/27/2013 FINAL   Final  TISSUE CULTURE     Status: None   Collection Time    09/28/13  8:32 AM      Result Value  Range Status   Specimen Description ULCER SACRAL   Final   Special Requests NONE   Final   Gram Stain PENDING   Incomplete   Culture     Final   Value: Culture reincubated for better growth     Performed at [facility]   Report Status PENDING   Incomplete  TISSUE CULTURE     Status: None   Collection Time    09/28/13  8:33 AM      Result Value Range Status   Specimen Description BONE SACRAL   Final   Special Requests NONE   Final   Gram Stain     Final   Value: FEW WBC PRESENT, PREDOMINANTLY PMN     NO SQUAMOUS EPITHELIAL CELLS SEEN     NO ORGANISMS SEEN     Performed at [facility]   Culture     Final   Value: NO GROWTH     Performed at [facility]   Report Status PENDING   Incomplete  CULTURE, BLOOD (ROUTINE X 2)     Status: None   Collection Time    09/29/13 11:42 AM      Result Value Range Status   Specimen Description BLOOD RIGHT HAND   Final   Special Requests     Final   Value: BOTTLES DRAWN AEROBIC AND ANAEROBIC ANA 4CC, AER 5CC   Culture  Setup Time     Final   Value: 09/29/2013 15:19     Performed at [facility]   Culture     Final   Value:        BLOOD CULTURE RECEIVED NO GROWTH TO DATE CULTURE WILL BE HELD FOR 5 DAYS BEFORE ISSUING A FINAL NEGATIVE REPORT     Performed at [facility]   Report Status PENDING   Incomplete  CULTURE, BLOOD (ROUTINE X 2)     Status: None   Collection Time    09/29/13 11:53 AM      Result Value Range Status   Specimen Description BLOOD LEFT ARM   Final   Special Requests     Final   Value: BOTTLES DRAWN AEROBIC AND ANAEROBIC 5CC BOTH BOTTLES   Culture  Setup Time     Final   Value: 09/29/2013 15:19     Performed  at [facility]     Final   Value:        BLOOD CULTURE RECEIVED NO GROWTH TO DATE CULTURE WILL BE HELD FOR 5 DAYS BEFORE ISSUING A FINAL NEGATIVE REPORT     Performed at [facility]   Report Status PENDING   Incomplete    Studies: No results  found. Scheduled Meds: . baclofen  20 mg Oral QID  . bisacodyl  10 mg Rectal Weekly  . cefTRIAXone (ROCEPHIN)  IV  2 g Intravenous Q24H  . diazepam  5 mg Oral BID  . enoxaparin (LOVENOX) injection  90 mg Subcutaneous Q12H  . loratadine  10 mg Oral Daily  . multivitamin with minerals  1 tablet Oral Daily  . neomycin-polymyxin B   Irrigation 3 times weekly  . olopatadine  1 drop Both Eyes BID  . oxybutynin  5 mg Oral BID  . polycarbophil  625 mg Oral QID  . protein supplement  8 oz Oral Q1400  . protein supplement  8 oz Oral Daily  . senna  8 tablet Oral Weekly  . simvastatin  20 mg Oral QHS  . sodium hypochlorite   Irrigation BID  . vancomycin  1,000 mg Intravenous Q12H  . vitamin C  500 mg Oral Daily  . Warfarin - Pharmacist Dosing Inpatient   Does not apply q1800  . zinc sulfate  220 mg Oral Daily   Continuous Infusions: . sodium chloride 75 mL/hr at 09/29/13 1655   Active Problems:   Tetraplegia   Neurogenic bladder   DVT (deep venous thrombosis)   Decubitus ulcer of sacral region, stage 4  Time spent: 25  Pamella Pert, MD [facility] Pager [phone]. If 7 PM - 7 AM, please contact night-coverage at [email or web address], password [facility] 09/30/2013, 8:13 AM  LOS: 5 days

## 2013-09-30 NOTE — Progress Notes (Signed)
ANTICOAGULATION CONSULT NOTE - Follow Up Consult  Pharmacy Consult for lovenox, warfarin Indication: Hx of DVT  Allergies  Allergen Reactions  . Latex    Patient Measurements: Height: 5\' 4"  (162.6 cm) (per patient upon questioned) Weight: 195 lb (88.451 kg) (per patient upon questioned) IBW/kg (Calculated) : 54.7   Vital Signs: Temp: 98.5 F (36.9 C) (10/28 0600) Temp src: Oral (10/28 0600) BP: 95/63 mmHg (10/28 0715) Pulse Rate: 76 (10/28 0600)  Labs:  Recent Labs  09/28/13 0515 09/29/13 0005 09/29/13 0817 09/30/13 0535  HGB 9.3*  --  8.8* 8.8*  HCT 28.9*  --  26.6* 27.6*  PLT 238  --  246 219  LABPROT 20.7*  --  20.3* 16.8*  INR 1.84*  --  1.79* 1.40  HEPARINUNFRC <0.10* 0.22* 0.33  --   CREATININE  --   --  0.38* 0.39*   Estimated Creatinine Clearance: 82.5 ml/min (by C-G formula based on Cr of 0.39).  Medications:  Scheduled:  . baclofen  20 mg Oral QID  . bisacodyl  10 mg Rectal Weekly  . cefTRIAXone (ROCEPHIN)  IV  2 g Intravenous Q24H  . diazepam  5 mg Oral BID  . enoxaparin (LOVENOX) injection  90 mg Subcutaneous Q12H  . loratadine  10 mg Oral Daily  . multivitamin with minerals  1 tablet Oral Daily  . neomycin-polymyxin B   Irrigation 3 times weekly  . olopatadine  1 drop Both Eyes BID  . oxybutynin  5 mg Oral BID  . polycarbophil  625 mg Oral QID  . protein supplement  8 oz Oral Q1400  . protein supplement  8 oz Oral Daily  . senna  8 tablet Oral Weekly  . simvastatin  20 mg Oral QHS  . sodium hypochlorite   Irrigation BID  . vancomycin  1,000 mg Intravenous Q12H  . vitamin C  500 mg Oral Daily  . Warfarin - Pharmacist Dosing Inpatient   Does not apply q1800  . zinc sulfate  220 mg Oral Daily   Infusions:  . sodium chloride 75 mL/hr at 09/29/13 1655   Assessment: 57 yoF with multiple decubitus ulcers: sacral, heels, calves [facility], on MRI sacral cellulitis, osteomyelitis and myositis. Admitted for tissue biopsy to  guide care then empiric antibiotics. Chronic warfarin held on admission, heparin infusion begun 10/24.  Heparin transitioned to SQ lovenox/warfarin bridge therapy 10/27.   D#2 LMWH/Warfarin bridge  INR subtherapeutic = 1.40 (goal 2-3) after coumadin 4mg  x 1 last night.  4 days held coumadin (10/23-10/26).  Home dose warfarin reported as 3mg  po daily.   Hgb drifting down, stable overnight.   No overt bleeding reported.  Pltc stable.   Goal of Therapy:  INR 2-3 Monitor platelets by anticoagulation protocol: Yes    Plan:   Continue Lovenox 90 mg (1mg /kg) SQ q12h.  Warfarin 4.5 mg PO x 1 today at 6pm  PT/INR daily   Haynes Hoehn, PharmD 09/30/2013, 9:48 AM  Pager: [phone]
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
## 2013-10-01 ENCOUNTER — Encounter (HOSPITAL_COMMUNITY)
Admission: AD | Disposition: A | Discharge status: Home Health | Payer: Self-pay | Source: Ambulatory Visit | Attending: Internal Medicine

## 2013-10-01 ENCOUNTER — Inpatient Hospital Stay (HOSPITAL_COMMUNITY): Payer: Medicare Other

## 2013-10-01 DIAGNOSIS — A4902 Methicillin resistant Staphylococcus aureus infection, unspecified site: Secondary | ICD-10-CM

## 2013-10-01 DIAGNOSIS — Z113 Encounter for screening for infections with a predominantly sexual mode of transmission: Secondary | ICD-10-CM

## 2013-10-01 DIAGNOSIS — G8250 Quadriplegia, unspecified: Secondary | ICD-10-CM

## 2013-10-01 DIAGNOSIS — N319 Neuromuscular dysfunction of bladder, unspecified: Secondary | ICD-10-CM

## 2013-10-01 DIAGNOSIS — I82409 Acute embolism and thrombosis of unspecified deep veins of unspecified lower extremity: Secondary | ICD-10-CM

## 2013-10-01 LAB — CBC
HCT: 26.1 % — ABNORMAL LOW (ref 36.0–46.0)
Hemoglobin: 8.6 g/dL — ABNORMAL LOW (ref 12.0–15.0)
MCHC: 33 g/dL (ref 30.0–36.0)
MCV: 92.6 fL (ref 78.0–100.0)
RBC: 2.82 MIL/uL — ABNORMAL LOW (ref 3.87–5.11)
WBC: 6.6 10*3/uL (ref 4.0–10.5)

## 2013-10-01 LAB — BASIC METABOLIC PANEL
CO2: 27 mEq/L (ref 19–32)
GFR calc non Af Amer: >90 mL/min (ref >90)
Glucose, Bld: 94 mg/dL (ref 70–99)
Potassium: 3.3 mEq/L — ABNORMAL LOW (ref 3.5–5.1)
Sodium: 138 mEq/L (ref 135–145)

## 2013-10-01 SURGERY — IRRIGATION AND DEBRIDEMENT WOUND
Anesthesia: General | Site: Hip | Laterality: Left

## 2013-10-01 MED ORDER — CEFAZOLIN SODIUM-DEXTROSE 2-3 GM-% IV SOLR
2.0000 g | Freq: Three times a day (TID) | INTRAVENOUS | Status: DC
  Administered 2013-10-01 – 2013-10-02 (×3): 2 g via INTRAVENOUS
  Filled 2013-10-01 (×5): qty 50

## 2013-10-01 MED ORDER — WARFARIN SODIUM 4 MG PO TABS
4.5000 mg | ORAL_TABLET | Freq: Once | ORAL | Status: AC
  Administered 2013-10-01: 4.5 mg via ORAL
  Filled 2013-10-01: qty 1

## 2013-10-01 MED ORDER — POTASSIUM CHLORIDE CRYS ER 20 MEQ PO TBCR
20.0000 meq | EXTENDED_RELEASE_TABLET | Freq: Once | ORAL | Status: AC
  Administered 2013-10-01: 20 meq via ORAL
  Filled 2013-10-01: qty 1

## 2013-10-01 MED ORDER — SODIUM CHLORIDE 0.9 % IV BOLUS (SEPSIS)
500.0000 mL | Freq: Once | INTRAVENOUS | Status: AC
  Administered 2013-10-01: 500 mL via INTRAVENOUS

## 2013-10-01 MED ORDER — LIDOCAINE HCL 1 % IJ SOLN
INTRAMUSCULAR | Status: AC
  Administered 2013-10-01: 16:00:00
  Filled 2013-10-01: qty 20

## 2013-10-01 MED ORDER — UNJURY VANILLA POWDER
8.0000 [oz_av] | Freq: Two times a day (BID) | ORAL | Status: DC
  Administered 2013-10-01 – 2013-10-03 (×5): 8 [oz_av] via ORAL
  Filled 2013-10-01 (×5): qty 27

## 2013-10-01 NOTE — Progress Notes (Signed)
Called and talked with primary RN, Onalee Hua regarding PICC insertion.  The pt is requesting that interventional radiology insert the PICC.   Instructed David to have order changed for IR to insert.    I called IR and spoke to [place].   Gave her the information regarding PICC for home antibiotics and is being D/C'd today.  Inetta Fermo said they can insert the PICC today.

## 2013-10-01 NOTE — Progress Notes (Signed)
ANTICOAGULATION CONSULT NOTE - Follow Up Consult  Pharmacy Consult for lovenox, warfarin Indication: Hx of DVT  Allergies  Allergen Reactions  . Latex    Patient Measurements: Height: 5\' 4"  (162.6 cm) (per patient upon questioned) Weight: 195 lb (88.451 kg) (per patient upon questioned) IBW/kg (Calculated) : 54.7   Vital Signs: Temp: 98.8 F (37.1 C) (10/29 0600) Temp src: Oral (10/29 0600) BP: 93/33 mmHg (10/29 0600) Pulse Rate: 73 (10/29 0600)  Labs:  Recent Labs  09/29/13 0005  09/29/13 0817 09/30/13 0535 10/01/13 0110  HGB  --   < > 8.8* 8.8* 8.6*  HCT  --   --  26.6* 27.6* 26.1*  PLT  --   --  246 219 251  LABPROT  --   --  20.3* 16.8* 17.5*  INR  --   --  1.79* 1.40 1.48  HEPARINUNFRC 0.22*  --  0.33  --   --   CREATININE  --   --  0.38* 0.39* 0.31*  < > = values in this interval not displayed. Estimated Creatinine Clearance: 82.5 ml/min (by C-G formula based on Cr of 0.31).  Medications:  Scheduled:  . baclofen  20 mg Oral QID  . bisacodyl  10 mg Rectal Weekly  . cefTRIAXone (ROCEPHIN)  IV  2 g Intravenous Q24H  . diazepam  5 mg Oral BID  . enoxaparin (LOVENOX) injection  90 mg Subcutaneous Q12H  . loratadine  10 mg Oral Daily  . multivitamin with minerals  1 tablet Oral Daily  . neomycin-polymyxin B   Irrigation 3 times weekly  . olopatadine  1 drop Both Eyes BID  . oxybutynin  5 mg Oral BID  . polycarbophil  625 mg Oral QID  . protein supplement  8 oz Oral Q1400  . protein supplement  8 oz Oral Daily  . senna  8 tablet Oral Weekly  . simvastatin  20 mg Oral QHS  . sodium chloride  500 mL Intravenous Once  . sodium hypochlorite   Irrigation BID  . vancomycin  1,000 mg Intravenous Q12H  . vitamin C  500 mg Oral Daily  . Warfarin - Pharmacist Dosing Inpatient   Does not apply q1800  . zinc sulfate  220 mg Oral Daily   Infusions:  . sodium chloride 75 mL/hr at 09/29/13 1655   Assessment: 57 yoF with multiple decubitus ulcers: sacral, heels,  calves [facility], on MRI sacral cellulitis, osteomyelitis and myositis. Admitted for tissue biopsy to guide care then empiric antibiotics. Chronic warfarin held on admission, heparin infusion begun 10/24.  Heparin transitioned to SQ lovenox/warfarin bridge therapy 10/27.   D#3 LMWH/Warfarin bridge  INR subtherapeutic = 1.48   Home dose warfarin reported as 3mg  po daily.   Hgb drifting down.   No overt bleeding reported.  Pltc stable.   Goal of Therapy:  INR 2-3 Monitor platelets by anticoagulation protocol: Yes    Plan:   Continue Lovenox 90 mg (1mg /kg) SQ q12h.  Repeat Warfarin 4.5 mg PO x 1 today at 6pm.  PT/INR daily   Clance Boll, PharmD, BCPS Pager: [phone] 10/01/2013 12:01 PM

## 2013-10-01 NOTE — Progress Notes (Addendum)
[facility] PROGRESS NOTE  KATHRINE RIEVES ZOX:[id number] DOB: [birth year] DOA: 09/25/2013 PCP: Rene Paci, MD  Assessment/Plan: Stage IV sacral decubitus ulcer -Concerned about/presumptive sacral osteomyelitis -MRI pelvis suggests osteomyelitis as well as myositis -Continue vancomycin -09/28/13 Surgical culture growing Staphylococcus aureus--final susceptibility pending -PICC line placed 10/29 -will need extended duration of abx--plan 4-6weeks -pt wants to go home 10/30 if [facility] can be arranged and final susceptibilities return -ESR 132 -continue abx D#4 Pyuria -Urine culture with multiple morphotypes Fever -Resolved History of DVT -Continue anticoagulation/treatment of the Lovenox as a bridge to warfarin -Patient will need INR checks at home Tetraplegia -Continue baclofen and Valium for contractures -Continue ducolax and Senokot weekly Neurogenic bladder -Patient has indwelling Foley catheter at home Hypokalemia -Replete -Check magnesium  Family Communication:   Pt at beside Disposition Plan:   Home when medically stable       Procedures/Studies: Mr Pelvis Wo Contrast  09/26/2013   CLINICAL DATA:  Quadriplegic patient with a sacral decubitus ulcer. Question abscess.  EXAM: MRI PELVIS WITHOUT CONTRAST  TECHNIQUE: Multiplanar, multisequence MR imaging was performed. No intravenous contrast was administered.  COMPARISON:  None.  FINDINGS: There is a large decubitus ulcer over the left ischium with intense edema in surrounding fatty tissues. Intense marrow edema is also identified in the left ischial tuberosity consistent with osteomyelitis. No discrete abscess is seen on this noncontrast examination. All visualized musculature is markedly atrophic. There is edema in the adductor brevis and magnus muscles and in the gracilis consistent with myositis. No focal fluid collection within muscle is identified.  A small right hip joint effusion is identified. There is no  marrow signal abnormality about the right hip. The sacroiliac joints and symphysis pubis are unremarkable. A tiny amount of fluid is seen about the iliacus muscles bilaterally, worse on the left. This is nonspecific but unlikely to be due to infection. Imaged intrapelvic contents are unremarkable.  IMPRESSION: Large decubitus ulcer of the left ischium with intense underlying cellulitis and osteomyelitis in the ischial tuberosity. Edema in the adductor brevis and magnus and gracilis muscles compatible with myositis is identified. No abscess is seen.   Electronically Signed   By: Drusilla Kanner M.D.   On: 09/26/2013 09:41   Ir Fluoro Guide Cv Line Right  10/01/2013   CLINICAL DATA:  Tetraplegia and needs IV antibiotics for infection.  EXAM: PICC LINE PLACEMENT WITH ULTRASOUND AND FLUOROSCOPIC GUIDANCE  FLUOROSCOPY TIME:  18 seconds  PROCEDURE: The patient was advised of the possible risks and complications and agreed to undergo the procedure. The patient was then brought to the angiographic suite for the procedure.  The right/left arm was prepped with chlorhexidine, draped in the usual sterile fashion using maximum barrier technique (cap and mask, sterile gown, sterile gloves, large sterile sheet, hand hygiene and cutaneous antisepsis) and infiltrated locally with 1% Lidocaine.  Ultrasound demonstrated patency of the right basilic vein, and this was documented with an image. Under real-time ultrasound guidance, this vein was accessed with a 21 gauge micropuncture needle and image documentation was performed. A 0.018 wire was introduced in to the vein. Over this, a 5 [place] single lumen power injectable PICC was advanced to the lower SVC. The PICC line length equals 38 cm. Fluoroscopy during the procedure and fluoro spot radiograph confirms appropriate catheter position. The catheter was flushed and covered with a sterile dressing.  Complications: None  IMPRESSION: Successful right arm Power PICC line placement  with ultrasound and fluoroscopic guidance. The catheter is ready  for use.   Electronically Signed   By: Richarda Overlie M.D.   On: 10/01/2013 15:09   Ir US Guide Vasc Access Right  10/01/2013   CLINICAL DATA:  Tetraplegia and needs IV antibiotics for infection.  EXAM: PICC LINE PLACEMENT WITH ULTRASOUND AND FLUOROSCOPIC GUIDANCE  FLUOROSCOPY TIME:  18 seconds  PROCEDURE: The patient was advised of the possible risks and complications and agreed to undergo the procedure. The patient was then brought to the angiographic suite for the procedure.  The right/left arm was prepped with chlorhexidine, draped in the usual sterile fashion using maximum barrier technique (cap and mask, sterile gown, sterile gloves, large sterile sheet, hand hygiene and cutaneous antisepsis) and infiltrated locally with 1% Lidocaine.  Ultrasound demonstrated patency of the right basilic vein, and this was documented with an image. Under real-time ultrasound guidance, this vein was accessed with a 21 gauge micropuncture needle and image documentation was performed. A 0.018 wire was introduced in to the vein. Over this, a 5 [place] single lumen power injectable PICC was advanced to the lower SVC. The PICC line length equals 38 cm. Fluoroscopy during the procedure and fluoro spot radiograph confirms appropriate catheter position. The catheter was flushed and covered with a sterile dressing.  Complications: None  IMPRESSION: Successful right arm Power PICC line placement with ultrasound and fluoroscopic guidance. The catheter is ready for use.   Electronically Signed   By: Richarda Overlie M.D.   On: 10/01/2013 15:09         Subjective: Patient denies fevers, chills, chest discomfort, shortness breath, nausea, vomiting, diarrhea, abdominal pain.  Objective: Filed Vitals:   09/30/13 1700 09/30/13 2200 10/01/13 0600 10/01/13 1309  BP: 147/82 117/58 93/33 104/63  Pulse:  83 73 72  Temp:  97.9 F (36.6 C) 98.8 F (37.1 C)   TempSrc:   Axillary Oral Oral  Resp:  20 20 18   Height:      Weight:      SpO2:  97% 95% 97%    Intake/Output Summary (Last 24 hours) at 10/01/13 1523 Last data filed at 10/01/13 0657  Gross per 24 hour  Intake   1175 ml  Output   4050 ml  Net  -2875 ml   Weight change:  Exam:   General:  Pt is alert, follows commands appropriately, not in acute distress  HEENT: No icterus, No thrush, [place]/AT  Cardiovascular: RRR, S1/S2, no rubs, no gallops  Respiratory: CTA bilaterally, no wheezing, no crackles, no rhonchi  Abdomen: Soft/+BS, non tender, non distended, no guarding  Extremities: trace LE edema, No lymphangitis, No petechiae, No rashes, no synovitis  Data Reviewed: Basic Metabolic Panel:  Recent Labs Lab 09/26/13 0455 09/26/13 1602 09/27/13 0337 09/29/13 0817 09/30/13 0535 10/01/13 0110  NA 136  --  139 134* 139 138  K 2.4* 3.8 3.6 3.6 3.6 3.3*  CL 95*  --  105 101 106 104  CO2 29  --  25 24 26 27   GLUCOSE 98  --  95 153* 103* 94  BUN 6  --  4* 7 11 11   CREATININE 0.50  --  0.31* 0.38* 0.39* 0.31*  CALCIUM 8.6  --  8.7 8.2* 8.7 8.3*   Liver Function Tests:  Recent Labs Lab 09/25/13 1835  AST 27  ALT 18  ALKPHOS 93  BILITOT 0.5  PROT 7.9  ALBUMIN 2.5*   No results found for this basename: LIPASE, AMYLASE,  in the last 168 hours No results found for this  basename: AMMONIA,  in the last 168 hours CBC:  Recent Labs Lab 09/27/13 0337 09/28/13 0515 09/29/13 0817 09/30/13 0535 10/01/13 0110  WBC 6.9 5.9 6.4 5.6 6.6  HGB 9.9* 9.3* 8.8* 8.8* 8.6*  HCT 30.3* 28.9* 26.6* 27.6* 26.1*  MCV 91.3 92.6 92.7 93.9 92.6  PLT 228 238 246 219 251   Cardiac Enzymes: No results found for this basename: CKTOTAL, CKMB, CKMBINDEX, TROPONINI,  in the last 168 hours BNP: No components found with this basename: POCBNP,  CBG: No results found for this basename: GLUCAP,  in the last 168 hours  Recent Results (from the past 240 hour(s))  MRSA PCR SCREENING     Status: None    Collection Time    09/25/13  8:11 PM      Result Value Range Status   MRSA by PCR NEGATIVE  NEGATIVE Final   Comment:            The GeneXpert MRSA Assay (FDA     approved for NASAL specimens     only), is one component of a     comprehensive MRSA colonization     surveillance program. It is not     intended to diagnose MRSA     infection nor to guide or     monitor treatment for     MRSA infections.  URINE CULTURE     Status: None   Collection Time    09/26/13  3:26 PM      Result Value Range Status   Specimen Description URINE, CLEAN CATCH   Final   Special Requests NONE   Final   Culture  Setup Time     Final   Value: 09/26/2013 20:46     Performed at [facility] Count     Final   Value: >=100,000 COLONIES/ML     Performed at [facility]   Culture     Final   Value: Multiple bacterial morphotypes present, none predominant. Suggest appropriate recollection if clinically indicated.     Performed at [facility]   Report Status 09/27/2013 FINAL   Final  TISSUE CULTURE     Status: None   Collection Time    09/28/13  8:32 AM      Result Value Range Status   Specimen Description ULCER SACRAL   Final   Special Requests NONE   Final   Gram Stain     Final   Value: FEW WBC PRESENT, PREDOMINANTLY PMN     NO ORGANISMS SEEN     Performed at [facility]   Culture     Final   Value: ABUNDANT STAPHYLOCOCCUS AUREUS     Note: RIFAMPIN AND GENTAMICIN SHOULD NOT BE USED AS SINGLE DRUGS FOR TREATMENT OF STAPH INFECTIONS.     Performed at [facility]   Report Status PENDING   Incomplete  TISSUE CULTURE     Status: None   Collection Time    09/28/13  8:33 AM      Result Value Range Status   Specimen Description BONE SACRAL   Final   Special Requests NONE   Final   Gram Stain     Final   Value: FEW WBC PRESENT, PREDOMINANTLY PMN     NO SQUAMOUS EPITHELIAL CELLS SEEN     NO ORGANISMS SEEN     Performed at [facility]    Culture     Final   Value: NO GROWTH  Performed at [facility]   Report Status PENDING   Incomplete  CULTURE, BLOOD (ROUTINE X 2)     Status: None   Collection Time    09/29/13 11:42 AM      Result Value Range Status   Specimen Description BLOOD RIGHT HAND   Final   Special Requests     Final   Value: BOTTLES DRAWN AEROBIC AND ANAEROBIC ANA 4CC, AER 5CC   Culture  Setup Time     Final   Value: 09/29/2013 15:19     Performed at [facility]   Culture     Final   Value:        BLOOD CULTURE RECEIVED NO GROWTH TO DATE CULTURE WILL BE HELD FOR 5 DAYS BEFORE ISSUING A FINAL NEGATIVE REPORT     Performed at [facility]   Report Status PENDING   Incomplete  CULTURE, BLOOD (ROUTINE X 2)     Status: None   Collection Time    09/29/13 11:53 AM      Result Value Range Status   Specimen Description BLOOD LEFT ARM   Final   Special Requests     Final   Value: BOTTLES DRAWN AEROBIC AND ANAEROBIC 5CC BOTH BOTTLES   Culture  Setup Time     Final   Value: 09/29/2013 15:19     Performed at [facility]   Culture     Final   Value:        BLOOD CULTURE RECEIVED NO GROWTH TO DATE CULTURE WILL BE HELD FOR 5 DAYS BEFORE ISSUING A FINAL NEGATIVE REPORT     Performed at [facility]   Report Status PENDING   Incomplete     Scheduled Meds: . baclofen  20 mg Oral QID  . bisacodyl  10 mg Rectal Weekly  .  ceFAZolin (ANCEF) IV  2 g Intravenous Q8H  . diazepam  5 mg Oral BID  . enoxaparin (LOVENOX) injection  90 mg Subcutaneous Q12H  . lidocaine      . loratadine  10 mg Oral Daily  . multivitamin with minerals  1 tablet Oral Daily  . neomycin-polymyxin B   Irrigation 3 times weekly  . olopatadine  1 drop Both Eyes BID  . oxybutynin  5 mg Oral BID  . polycarbophil  625 mg Oral QID  . protein supplement  8 oz Oral q12n4p  . senna  8 tablet Oral Weekly  . simvastatin  20 mg Oral QHS  . sodium chloride  500 mL Intravenous Once  . sodium hypochlorite    Irrigation BID  . vancomycin  1,000 mg Intravenous Q12H  . vitamin C  500 mg Oral Daily  . warfarin  4.5 mg Oral ONCE-1800  . Warfarin - Pharmacist Dosing Inpatient   Does not apply q1800  . zinc sulfate  220 mg Oral Daily   Continuous Infusions: . sodium chloride 75 mL/hr at 09/29/13 1655     Laiklyn Pilkenton, DO  [facility] Pager [phone]  If 7PM-7AM, please contact night-coverage [email or web address] Password [email or web address] 10/01/2013, 3:23 PM   LOS: 6 days

## 2013-10-01 NOTE — Procedures (Signed)
Placement of right arm (basilic vein) PICC.  Tip in SVC and ready to use.  Length = 38 cm.

## 2013-10-01 NOTE — Progress Notes (Signed)
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"   Antibiotics:  Anti-infectives   Start     Dose/Rate Route Frequency Ordered Stop   10/01/13 1600  ceFAZolin (ANCEF) IVPB 2 g/50 mL premix     2 g 100 mL/hr over 30 Minutes Intravenous Every 8 hours 10/01/13 1444     09/29/13 1400  vancomycin (VANCOCIN) IVPB 1000 mg/200 mL premix     1,000 mg 200 mL/hr over 60 Minutes Intravenous Every 12 hours 09/29/13 1224     09/29/13 1200  cefTRIAXone (ROCEPHIN) 2 g in dextrose 5 % 50 mL IVPB  Status:  Discontinued     2 g 100 mL/hr over 30 Minutes Intravenous Every 24 hours 09/28/13 1112 10/01/13 1430   09/29/13 0000  vancomycin (VANCOCIN) IVPB 1000 mg/200 mL premix  Status:  Discontinued     1,000 mg 200 mL/hr over 60 Minutes Intravenous Every 12 hours 09/28/13 1112 09/29/13 1224   09/28/13 1100  vancomycin (VANCOCIN) IVPB 1000 mg/200 mL premix     1,000 mg 200 mL/hr over 60 Minutes Intravenous NOW 09/28/13 1055 09/28/13 1518   09/28/13 1100  cefTRIAXone (ROCEPHIN) 2 g in dextrose 5 % 50 mL IVPB     2 g 100 mL/hr over 30 Minutes Intravenous  Once 09/28/13 1055 09/28/13 1308   09/26/13 2200  amoxicillin (AMOXIL) capsule 250 mg  Status:  Discontinued     250 mg Oral Every other day 09/26/13 0950 09/26/13 1607   09/26/13 1000  amoxicillin (AMOXIL) capsule 250 mg  Status:  Discontinued     250 mg Oral Every M-W-F 09/25/13 1745 09/26/13 0950      Medications: Scheduled Meds: . baclofen  20 mg Oral QID  . bisacodyl  10 mg Rectal Weekly  .  ceFAZolin (ANCEF) IV  2 g Intravenous Q8H  . diazepam  5 mg Oral BID  . enoxaparin (LOVENOX) injection  90 mg Subcutaneous Q12H  . loratadine  10 mg Oral Daily  . multivitamin with minerals  1 tablet Oral Daily  . neomycin-polymyxin B   Irrigation 3 times weekly  . olopatadine  1 drop Both Eyes BID  . oxybutynin  5 mg Oral BID  .  polycarbophil  625 mg Oral QID  . potassium chloride  20 mEq Oral Once  . protein supplement  8 oz Oral q12n4p  . senna  8 tablet Oral Weekly  . simvastatin  20 mg Oral QHS  . sodium chloride  500 mL Intravenous Once  . sodium hypochlorite   Irrigation BID  . vancomycin  1,000 mg Intravenous Q12H  . vitamin C  500 mg Oral Daily  . warfarin  4.5 mg Oral ONCE-1800  . Warfarin - Pharmacist Dosing Inpatient   Does not apply q1800  . zinc sulfate  220 mg Oral Daily   Continuous Infusions: . sodium chloride 75 mL/hr at 09/29/13 1655   PRN Meds:.acetaminophen, acetaminophen, acetaminophen-codeine, albuterol, ALPRAZolam, traZODone   Objective: Weight change:   Intake/Output Summary (Last 24 hours) at 10/01/13 1614 Last data filed at 10/01/13 0657  Gross per 24 hour  Intake   1175 ml  Output   4050 ml  Net  -2875 ml   Blood pressure 104/63, pulse 72, temperature 98.8 F (37.1 C), temperature source Oral, resp. rate 18, height 5\' 4"  (1.626 m), weight 195 lb (88.451 kg), SpO2 97.00%. Temp:  [  97.9 F (36.6 C)-98.8 F (37.1 C)] 98.8 F (37.1 C) (10/29 0600) Pulse Rate:  [72-83] 72 (10/29 1309) Resp:  [18-20] 18 (10/29 1309) BP: (93-147)/(33-82) 104/63 mmHg (10/29 1309) SpO2:  [95 %-97 %] 97 % (10/29 1309)  Physical Exam: General: Alert and awake, oriented x3, not in any acute distress. CVS regular rate, normal r,   Chest: clear to auscultation bilaterally, no wheezing, rales or rhonchi Extremities/skin: did not examine dressed wounds Neuro: quadraplegic  Lab Results:  Recent Labs  09/30/13 0535 10/01/13 0110  WBC 5.6 6.6  HGB 8.8* 8.6*  HCT 27.6* 26.1*  PLT 219 251    BMET  Recent Labs  09/30/13 0535 10/01/13 0110  NA 139 138  K 3.6 3.3*  CL 106 104  CO2 26 27  GLUCOSE 103* 94  BUN 11 11  CREATININE 0.39* 0.31*  CALCIUM 8.7 8.3*    Micro Results: Recent Results (from the past 240 hour(s))  MRSA PCR SCREENING     Status: None   Collection Time     09/25/13  8:11 PM      Result Value Range Status   MRSA by PCR NEGATIVE  NEGATIVE Final   Comment:            The GeneXpert MRSA Assay (FDA     approved for NASAL specimens     only), is one component of a     comprehensive MRSA colonization     surveillance program. It is not     intended to diagnose MRSA     infection nor to guide or     monitor treatment for     MRSA infections.  URINE CULTURE     Status: None   Collection Time    09/26/13  3:26 PM      Result Value Range Status   Specimen Description URINE, CLEAN CATCH   Final   Special Requests NONE   Final   Culture  Setup Time     Final   Value: 09/26/2013 20:46     Performed at [facility] Count     Final   Value: >=100,000 COLONIES/ML     Performed at [facility]   Culture     Final   Value: Multiple bacterial morphotypes present, none predominant. Suggest appropriate recollection if clinically indicated.     Performed at [facility]   Report Status 09/27/2013 FINAL   Final  TISSUE CULTURE     Status: None   Collection Time    09/28/13  8:32 AM      Result Value Range Status   Specimen Description ULCER SACRAL   Final   Special Requests NONE   Final   Gram Stain     Final   Value: FEW WBC PRESENT, PREDOMINANTLY PMN     NO ORGANISMS SEEN     Performed at [facility]   Culture     Final   Value: ABUNDANT STAPHYLOCOCCUS AUREUS     Note: RIFAMPIN AND GENTAMICIN SHOULD NOT BE USED AS SINGLE DRUGS FOR TREATMENT OF STAPH INFECTIONS.     Performed at [facility]   Report Status PENDING   Incomplete  TISSUE CULTURE     Status: None   Collection Time    09/28/13  8:33 AM      Result Value Range Status   Specimen Description BONE SACRAL   Final   Special Requests NONE   Final  Gram Stain     Final   Value: FEW WBC PRESENT, PREDOMINANTLY PMN     NO SQUAMOUS EPITHELIAL CELLS SEEN     NO ORGANISMS SEEN     Performed at [facility]   Culture     Final    Value: NO GROWTH     Performed at [facility]   Report Status PENDING   Incomplete  CULTURE, BLOOD (ROUTINE X 2)     Status: None   Collection Time    09/29/13 11:42 AM      Result Value Range Status   Specimen Description BLOOD RIGHT HAND   Final   Special Requests     Final   Value: BOTTLES DRAWN AEROBIC AND ANAEROBIC ANA 4CC, AER 5CC   Culture  Setup Time     Final   Value: 09/29/2013 15:19     Performed at [facility]   Culture     Final   Value:        BLOOD CULTURE RECEIVED NO GROWTH TO DATE CULTURE WILL BE HELD FOR 5 DAYS BEFORE ISSUING A FINAL NEGATIVE REPORT     Performed at [facility]   Report Status PENDING   Incomplete  CULTURE, BLOOD (ROUTINE X 2)     Status: None   Collection Time    09/29/13 11:53 AM      Result Value Range Status   Specimen Description BLOOD LEFT ARM   Final   Special Requests     Final   Value: BOTTLES DRAWN AEROBIC AND ANAEROBIC 5CC BOTH BOTTLES   Culture  Setup Time     Final   Value: 09/29/2013 15:19     Performed at [facility]   Culture     Final   Value:        BLOOD CULTURE RECEIVED NO GROWTH TO DATE CULTURE WILL BE HELD FOR 5 DAYS BEFORE ISSUING A FINAL NEGATIVE REPORT     Performed at [facility]   Report Status PENDING   Incomplete    Studies/Results: Ir Fluoro Guide Cv Line Right  10/01/2013   CLINICAL DATA:  Tetraplegia and needs IV antibiotics for infection.  EXAM: PICC LINE PLACEMENT WITH ULTRASOUND AND FLUOROSCOPIC GUIDANCE  FLUOROSCOPY TIME:  18 seconds  PROCEDURE: The patient was advised of the possible risks and complications and agreed to undergo the procedure. The patient was then brought to the angiographic suite for the procedure.  The right/left arm was prepped with chlorhexidine, draped in the usual sterile fashion using maximum barrier technique (cap and mask, sterile gown, sterile gloves, large sterile sheet, hand hygiene and cutaneous antisepsis) and infiltrated locally with  1% Lidocaine.  Ultrasound demonstrated patency of the right basilic vein, and this was documented with an image. Under real-time ultrasound guidance, this vein was accessed with a 21 gauge micropuncture needle and image documentation was performed. A 0.018 wire was introduced in to the vein. Over this, a 5 [place] single lumen power injectable PICC was advanced to the lower SVC. The PICC line length equals 38 cm. Fluoroscopy during the procedure and fluoro spot radiograph confirms appropriate catheter position. The catheter was flushed and covered with a sterile dressing.  Complications: None  IMPRESSION: Successful right arm Power PICC line placement with ultrasound and fluoroscopic guidance. The catheter is ready for use.   Electronically Signed   By: Richarda Overlie M.D.   On: 10/01/2013 15:09   Ir US Guide Vasc Access Right  10/01/2013   CLINICAL DATA:  Tetraplegia and needs IV antibiotics for infection.  EXAM: PICC LINE PLACEMENT WITH ULTRASOUND AND FLUOROSCOPIC GUIDANCE  FLUOROSCOPY TIME:  18 seconds  PROCEDURE: The patient was advised of the possible risks and complications and agreed to undergo the procedure. The patient was then brought to the angiographic suite for the procedure.  The right/left arm was prepped with chlorhexidine, draped in the usual sterile fashion using maximum barrier technique (cap and mask, sterile gown, sterile gloves, large sterile sheet, hand hygiene and cutaneous antisepsis) and infiltrated locally with 1% Lidocaine.  Ultrasound demonstrated patency of the right basilic vein, and this was documented with an image. Under real-time ultrasound guidance, this vein was accessed with a 21 gauge micropuncture needle and image documentation was performed. A 0.018 wire was introduced in to the vein. Over this, a 5 [place] single lumen power injectable PICC was advanced to the lower SVC. The PICC line length equals 38 cm. Fluoroscopy during the procedure and fluoro spot radiograph confirms  appropriate catheter position. The catheter was flushed and covered with a sterile dressing.  Complications: None  IMPRESSION: Successful right arm Power PICC line placement with ultrasound and fluoroscopic guidance. The catheter is ready for use.   Electronically Signed   By: Richarda Overlie M.D.   On: 10/01/2013 15:09      Assessment/Plan: [name] is a 57 y.o. female with  Quadraplegia, chronic sacral decubitus ulcers sp local debridements and with wound culture from 09/25/13 having grown MRSA R to FQ, clinda S  bactrim, S to [place], as well as a Klebsiella PNA R to AMP, but S to AMP/SUL and all other abx tested now sp I and D of the decubitus ulcer with bone sent for culture growin Staph Aureus  #1 Sacral decubitus ulcer with ischial osteomyelitis sp I and D with Staph Aureus growing on culture  --narrowed to vancomycin and ancef --fu sensis on the S. Aureus --The MRSA and Kleb PNA from wound culture are more superficial and do not necessarily represent culprit pathogen. Pt asked e to check to see if Dr. Kelly Splinter did not obtain these cultures but they appear to have been obtainedin wound center with order from Dr Leanord Hawking  I will nonetheless check with Dr. Kelly Splinter --will plan on 6 weeks of targetted abx  #2 Screening: check HIV, Hep C negative in 2013    LOS: 6 days   Acey Lav 10/01/2013, 4:14 PM

## 2013-10-01 NOTE — Progress Notes (Signed)
NUTRITION FOLLOW UP  Intervention:   - Will increase vanilla Unjury to BID - Encouraged increased meal intake with a focus on high protein foods to promote wound healing - Will continue to monitor   Nutrition Dx:   Increased nutrition needs related to wounds as evidenced by protein needs >100 gm daily - ongoing    Goal:   Meet >90% estimated needs with meals and supplements - not met   Monitor:   Weights, labs, intake  Assessment:   57 yo F with long standing quadriplegia with bilateral decubitus ulcers on her posterior heels and an area on her left posterior leg with exposed tendon. Pt also has a pressure area on her left buttock over the ischial tuberosity. This is now necrotic and has undergone extensive debridement.  POD# 3 excision of sacral/ischial ulcer. Met with pt who reports she has been eating well, better than at home, and is worried about gaining weight. States she has been drinking the vanilla Unjury daily, does not like the chocolate. Meal intake has been 75%.   Potassium low. Getting daily multivitamin and vitamin C and zinc.   Height: Ht Readings from Last 1 Encounters:  09/28/13 5\' 4"  (1.626 m)    Weight Status:   Wt Readings from Last 1 Encounters:  09/28/13 195 lb (88.451 kg)    Re-estimated needs:  Kcal: 1500-1600  Protein: 120-130 gm  Fluid: 2.2L daily  Skin: Stage 3 left heel, stage 4 left ischial tuberosity pressure ulcer, area on left posterior leg with exposed tendon  Diet Order: General   Intake/Output Summary (Last 24 hours) at 10/01/13 1217 Last data filed at 10/01/13 0657  Gross per 24 hour  Intake   1715 ml  Output   5050 ml  Net  -3335 ml    Last BM: 10/25   Labs:   Recent Labs Lab 09/29/13 0817 09/30/13 0535 10/01/13 0110  NA 134* 139 138  K 3.6 3.6 3.3*  CL 101 106 104  CO2 24 26 27   BUN 7 11 11   CREATININE 0.38* 0.39* 0.31*  CALCIUM 8.2* 8.7 8.3*  GLUCOSE 153* 103* 94    CBG (last 3)  No results found for  this basename: GLUCAP,  in the last 72 hours  Scheduled Meds: . baclofen  20 mg Oral QID  . bisacodyl  10 mg Rectal Weekly  . cefTRIAXone (ROCEPHIN)  IV  2 g Intravenous Q24H  . diazepam  5 mg Oral BID  . enoxaparin (LOVENOX) injection  90 mg Subcutaneous Q12H  . loratadine  10 mg Oral Daily  . multivitamin with minerals  1 tablet Oral Daily  . neomycin-polymyxin B   Irrigation 3 times weekly  . olopatadine  1 drop Both Eyes BID  . oxybutynin  5 mg Oral BID  . polycarbophil  625 mg Oral QID  . protein supplement  8 oz Oral Daily  . senna  8 tablet Oral Weekly  . simvastatin  20 mg Oral QHS  . sodium chloride  500 mL Intravenous Once  . sodium hypochlorite   Irrigation BID  . vancomycin  1,000 mg Intravenous Q12H  . vitamin C  500 mg Oral Daily  . warfarin  4.5 mg Oral ONCE-1800  . Warfarin - Pharmacist Dosing Inpatient   Does not apply q1800  . zinc sulfate  220 mg Oral Daily    Continuous Infusions: . sodium chloride 75 mL/hr at 09/29/13 [address] [place], [place], [place] [phone] Pager  [phone] After Hours Pager  
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
## 2013-10-01 NOTE — Progress Notes (Signed)
Rx Brief Abx note:  IV Vancomycin  Assessement:  VT=16.6 mg/L after 1gm q12h @ Css  At goal for osteomyelitis (15-20)  SCr stable  A-febrile  Plan:  No change  F/u SCr/levels as needed.  Lorenza Evangelist 10/01/2013 5:12 AM

## 2013-10-02 DIAGNOSIS — M869 Osteomyelitis, unspecified: Secondary | ICD-10-CM

## 2013-10-02 DIAGNOSIS — B961 Klebsiella pneumoniae [K. pneumoniae] as the cause of diseases classified elsewhere: Secondary | ICD-10-CM

## 2013-10-02 DIAGNOSIS — M9080 Osteopathy in diseases classified elsewhere, unspecified site: Secondary | ICD-10-CM

## 2013-10-02 LAB — PROTIME-INR: Prothrombin Time: 19.1 seconds — ABNORMAL HIGH (ref 11.6–15.2)

## 2013-10-02 LAB — CBC
HCT: 28.3 % — ABNORMAL LOW (ref 36.0–46.0)
Hemoglobin: 9.1 g/dL — ABNORMAL LOW (ref 12.0–15.0)
MCH: 29.9 pg (ref 26.0–34.0)
MCV: 93.1 fL (ref 78.0–100.0)
RBC: 3.04 MIL/uL — ABNORMAL LOW (ref 3.87–5.11)

## 2013-10-02 LAB — MAGNESIUM: Magnesium: 2 mg/dL (ref 1.5–2.5)

## 2013-10-02 LAB — BASIC METABOLIC PANEL
BUN: 6 mg/dL (ref 6–23)
CO2: 28 mEq/L (ref 19–32)
Chloride: 105 mEq/L (ref 96–112)
Chloride: 104 mEq/L (ref 96–112)
Creatinine, Ser: 0.3 mg/dL — ABNORMAL LOW (ref 0.50–1.10)
GFR calc non Af Amer: >90 mL/min (ref >90)
GFR calc non Af Amer: >90 mL/min (ref >90)
Glucose, Bld: 127 mg/dL — ABNORMAL HIGH (ref 70–99)
Potassium: 3.3 mEq/L — ABNORMAL LOW (ref 3.5–5.1)
Sodium: 140 mEq/L (ref 135–145)

## 2013-10-02 LAB — TISSUE CULTURE

## 2013-10-02 MED ORDER — SODIUM CHLORIDE 0.9 % IJ SOLN: 10.0000 mL | Freq: Two times a day (BID) | INTRAMUSCULAR | Status: DC

## 2013-10-02 MED ORDER — WARFARIN SODIUM 4 MG PO TABS
4.0000 mg | ORAL_TABLET | Freq: Once | ORAL | Status: AC
  Administered 2013-10-02: 4 mg via ORAL
  Filled 2013-10-02: qty 1

## 2013-10-02 MED ORDER — POTASSIUM CHLORIDE CRYS ER 20 MEQ PO TBCR
20.0000 meq | EXTENDED_RELEASE_TABLET | Freq: Once | ORAL | Status: AC
  Administered 2013-10-02: 20 meq via ORAL
  Filled 2013-10-02: qty 1

## 2013-10-02 MED ORDER — SODIUM CHLORIDE 0.9 % IJ SOLN
10.0000 mL | INTRAMUSCULAR | Status: DC | PRN
  Administered 2013-10-02 – 2013-10-03 (×3): 10 mL

## 2013-10-02 NOTE — Progress Notes (Signed)
[facility] PROGRESS NOTE  [name] ZOX:[id number] DOB: Mar 22, 1956 DOA: 09/25/2013 PCP: Rene Paci, MD  Assessment/Plan: Stage IV sacral decubitus ulcer  -Concerned about/presumptive sacral osteomyelitis  -MRI pelvis suggests osteomyelitis as well as myositis  -Continue vancomycin  -currently on cefazolin per ID -09/28/13 Surgical culture growing MRSA  -PICC line placed 10/29  -will need extended duration of abx--plan 6weeks  -discussed with Plastics today-->no further surgical intervention at this time, okay to go home from their standpoint -pt wants to go home if [facility] can be arranged and final susceptibilities return  -ESR 132  -continue abx D#5  -reconsult wound care nurse for recommendations post debridement Pyuria  -Urine culture with multiple morphotypes  Fever  -Resolved  History of DVT  -Continue anticoagulation/treatment of the Lovenox as a bridge to warfarin  -Patient will need INR checks at home  Tetraplegia  -Continue baclofen and Valium for contractures  -Continue ducolax and Senokot weekly  Neurogenic bladder  -Patient has indwelling Foley catheter at home  Hypokalemia  -Replete  -Check magnesium--2.0 Family Communication: Pt at beside  Disposition Plan: Home 10/03/13--pt refuses SNF   Antibiotics:  Vanco 09/28/13>>>  Cefazolin 09/28/13>>>        Procedures/Studies: Mr Pelvis Wo Contrast  09/26/2013   CLINICAL DATA:  Quadriplegic patient with a sacral decubitus ulcer. Question abscess.  EXAM: MRI PELVIS WITHOUT CONTRAST  TECHNIQUE: Multiplanar, multisequence MR imaging was performed. No intravenous contrast was administered.  COMPARISON:  None.  FINDINGS: There is a large decubitus ulcer over the left ischium with intense edema in surrounding fatty tissues. Intense marrow edema is also identified in the left ischial tuberosity consistent with osteomyelitis. No discrete abscess is seen on this noncontrast examination. All visualized  musculature is markedly atrophic. There is edema in the adductor brevis and magnus muscles and in the gracilis consistent with myositis. No focal fluid collection within muscle is identified.  A small right hip joint effusion is identified. There is no marrow signal abnormality about the right hip. The sacroiliac joints and symphysis pubis are unremarkable. A tiny amount of fluid is seen about the iliacus muscles bilaterally, worse on the left. This is nonspecific but unlikely to be due to infection. Imaged intrapelvic contents are unremarkable.  IMPRESSION: Large decubitus ulcer of the left ischium with intense underlying cellulitis and osteomyelitis in the ischial tuberosity. Edema in the adductor brevis and magnus and gracilis muscles compatible with myositis is identified. No abscess is seen.   Electronically Signed   By: Drusilla Kanner M.D.   On: 09/26/2013 09:41   Ir Fluoro Guide Cv Line Right  10/01/2013   CLINICAL DATA:  Tetraplegia and needs IV antibiotics for infection.  EXAM: PICC LINE PLACEMENT WITH ULTRASOUND AND FLUOROSCOPIC GUIDANCE  FLUOROSCOPY TIME:  18 seconds  PROCEDURE: The patient was advised of the possible risks and complications and agreed to undergo the procedure. The patient was then brought to the angiographic suite for the procedure.  The right/left arm was prepped with chlorhexidine, draped in the usual sterile fashion using maximum barrier technique (cap and mask, sterile gown, sterile gloves, large sterile sheet, hand hygiene and cutaneous antisepsis) and infiltrated locally with 1% Lidocaine.  Ultrasound demonstrated patency of the right basilic vein, and this was documented with an image. Under real-time ultrasound guidance, this vein was accessed with a 21 gauge micropuncture needle and image documentation was performed. A 0.018 wire was introduced in to the vein. Over this, a 5 [place] single lumen power injectable PICC was  advanced to the lower SVC. The PICC line length  equals 38 cm. Fluoroscopy during the procedure and fluoro spot radiograph confirms appropriate catheter position. The catheter was flushed and covered with a sterile dressing.  Complications: None  IMPRESSION: Successful right arm Power PICC line placement with ultrasound and fluoroscopic guidance. The catheter is ready for use.   Electronically Signed   By: Richarda Overlie M.D.   On: 10/01/2013 15:09   Ir US Guide Vasc Access Right  10/01/2013   CLINICAL DATA:  Tetraplegia and needs IV antibiotics for infection.  EXAM: PICC LINE PLACEMENT WITH ULTRASOUND AND FLUOROSCOPIC GUIDANCE  FLUOROSCOPY TIME:  18 seconds  PROCEDURE: The patient was advised of the possible risks and complications and agreed to undergo the procedure. The patient was then brought to the angiographic suite for the procedure.  The right/left arm was prepped with chlorhexidine, draped in the usual sterile fashion using maximum barrier technique (cap and mask, sterile gown, sterile gloves, large sterile sheet, hand hygiene and cutaneous antisepsis) and infiltrated locally with 1% Lidocaine.  Ultrasound demonstrated patency of the right basilic vein, and this was documented with an image. Under real-time ultrasound guidance, this vein was accessed with a 21 gauge micropuncture needle and image documentation was performed. A 0.018 wire was introduced in to the vein. Over this, a 5 [place] single lumen power injectable PICC was advanced to the lower SVC. The PICC line length equals 38 cm. Fluoroscopy during the procedure and fluoro spot radiograph confirms appropriate catheter position. The catheter was flushed and covered with a sterile dressing.  Complications: None  IMPRESSION: Successful right arm Power PICC line placement with ultrasound and fluoroscopic guidance. The catheter is ready for use.   Electronically Signed   By: Richarda Overlie M.D.   On: 10/01/2013 15:09         Subjective: Patient denies fevers, chills, chest discomfort, shortness  breath, nausea, vomiting, diarrhea, abdominal pain. No headaches. No rashes.  Objective: Filed Vitals:   10/01/13 0600 10/01/13 1309 10/01/13 2224 10/02/13 0432  BP: 93/33 104/63 89/41 99/57   Pulse: 73 72 73 73  Temp: 98.8 F (37.1 C)  98.5 F (36.9 C) 98.2 F (36.8 C)  TempSrc: Oral Oral Oral Oral  Resp: 20 18 18 16   Height:      Weight:      SpO2: 95% 97% 99% 99%    Intake/Output Summary (Last 24 hours) at 10/02/13 1046 Last data filed at 10/02/13 1002  Gross per 24 hour  Intake 2278.75 ml  Output   4650 ml  Net -2371.25 ml   Weight change:  Exam:   General:  Pt is alert, follows commands appropriately, not in acute distress  HEENT: No icterus, No thrush,  [place]/AT  Cardiovascular: RRR, S1/S2, no rubs  Respiratory: CTA bilaterally, no wheezing, no crackles, no rhonchi  Abdomen: Soft/+BS, non tender, non distended, no guarding  Extremities: trace LE edema, No lymphangitis, No petechiae, No rashes, no synovitis  Data Reviewed: Basic Metabolic Panel:  Recent Labs Lab 09/27/13 0337 09/29/13 0817 09/30/13 0535 10/01/13 0110 10/02/13 0810  NA 139 134* 139 138 140  K 3.6 3.6 3.6 3.3* 3.3*  CL 105 101 106 104 105  CO2 25 24 26 27 27   GLUCOSE 95 153* 103* 94 127*  BUN 4* 7 11 11 6   CREATININE 0.31* 0.38* 0.39* 0.31* 0.34*  CALCIUM 8.7 8.2* 8.7 8.3* 8.9  MG  --   --   --   --  2.0  Liver Function Tests:  Recent Labs Lab 09/25/13 1835  AST 27  ALT 18  ALKPHOS 93  BILITOT 0.5  PROT 7.9  ALBUMIN 2.5*   No results found for this basename: LIPASE, AMYLASE,  in the last 168 hours No results found for this basename: AMMONIA,  in the last 168 hours CBC:  Recent Labs Lab 09/28/13 0515 09/29/13 0817 09/30/13 0535 10/01/13 0110 10/02/13 0810  WBC 5.9 6.4 5.6 6.6 5.3  HGB 9.3* 8.8* 8.8* 8.6* 9.1*  HCT 28.9* 26.6* 27.6* 26.1* 28.3*  MCV 92.6 92.7 93.9 92.6 93.1  PLT 238 246 219 251 240   Cardiac Enzymes: No results found for this basename: CKTOTAL,  CKMB, CKMBINDEX, TROPONINI,  in the last 168 hours BNP: No components found with this basename: POCBNP,  CBG: No results found for this basename: GLUCAP,  in the last 168 hours  Recent Results (from the past 240 hour(s))  MRSA PCR SCREENING     Status: None   Collection Time    09/25/13  8:11 PM      Result Value Range Status   MRSA by PCR NEGATIVE  NEGATIVE Final   Comment:            The GeneXpert MRSA Assay (FDA     approved for NASAL specimens     only), is one component of a     comprehensive MRSA colonization     surveillance program. It is not     intended to diagnose MRSA     infection nor to guide or     monitor treatment for     MRSA infections.  URINE CULTURE     Status: None   Collection Time    09/26/13  3:26 PM      Result Value Range Status   Specimen Description URINE, CLEAN CATCH   Final   Special Requests NONE   Final   Culture  Setup Time     Final   Value: 09/26/2013 20:46     Performed at [facility] Count     Final   Value: >=100,000 COLONIES/ML     Performed at [facility]   Culture     Final   Value: Multiple bacterial morphotypes present, none predominant. Suggest appropriate recollection if clinically indicated.     Performed at [facility]   Report Status 09/27/2013 FINAL   Final  TISSUE CULTURE     Status: None   Collection Time    09/28/13  8:32 AM      Result Value Range Status   Specimen Description ULCER SACRAL   Final   Special Requests NONE   Final   Gram Stain     Final   Value: FEW WBC PRESENT, PREDOMINANTLY PMN     NO ORGANISMS SEEN     Performed at [facility]   Culture     Final   Value: ABUNDANT STAPHYLOCOCCUS AUREUS     Note: RIFAMPIN AND GENTAMICIN SHOULD NOT BE USED AS SINGLE DRUGS FOR TREATMENT OF STAPH INFECTIONS.     Performed at [facility]   Report Status PENDING   Incomplete  TISSUE CULTURE     Status: None   Collection Time    09/28/13  8:33 AM      Result  Value Range Status   Specimen Description BONE SACRAL   Final   Special Requests NONE   Final   Gram Stain  Final   Value: FEW WBC PRESENT, PREDOMINANTLY PMN     NO SQUAMOUS EPITHELIAL CELLS SEEN     NO ORGANISMS SEEN     Performed at [facility]   Culture     Final   Value: NO GROWTH     Performed at [facility]   Report Status PENDING   Incomplete  CULTURE, BLOOD (ROUTINE X 2)     Status: None   Collection Time    09/29/13 11:42 AM      Result Value Range Status   Specimen Description BLOOD RIGHT HAND   Final   Special Requests     Final   Value: BOTTLES DRAWN AEROBIC AND ANAEROBIC ANA 4CC, AER 5CC   Culture  Setup Time     Final   Value: 09/29/2013 15:19     Performed at [facility]   Culture     Final   Value:        BLOOD CULTURE RECEIVED NO GROWTH TO DATE CULTURE WILL BE HELD FOR 5 DAYS BEFORE ISSUING A FINAL NEGATIVE REPORT     Performed at [facility]   Report Status PENDING   Incomplete  CULTURE, BLOOD (ROUTINE X 2)     Status: None   Collection Time    09/29/13 11:53 AM      Result Value Range Status   Specimen Description BLOOD LEFT ARM   Final   Special Requests     Final   Value: BOTTLES DRAWN AEROBIC AND ANAEROBIC 5CC BOTH BOTTLES   Culture  Setup Time     Final   Value: 09/29/2013 15:19     Performed at [facility]   Culture     Final   Value:        BLOOD CULTURE RECEIVED NO GROWTH TO DATE CULTURE WILL BE HELD FOR 5 DAYS BEFORE ISSUING A FINAL NEGATIVE REPORT     Performed at [facility]   Report Status PENDING   Incomplete     Scheduled Meds: . baclofen  20 mg Oral QID  . bisacodyl  10 mg Rectal Weekly  .  ceFAZolin (ANCEF) IV  2 g Intravenous Q8H  . diazepam  5 mg Oral BID  . enoxaparin (LOVENOX) injection  90 mg Subcutaneous Q12H  . loratadine  10 mg Oral Daily  . multivitamin with minerals  1 tablet Oral Daily  . neomycin-polymyxin B   Irrigation 3 times weekly  . olopatadine  1 drop Both  Eyes BID  . oxybutynin  5 mg Oral BID  . polycarbophil  625 mg Oral QID  . protein supplement  8 oz Oral q12n4p  . senna  8 tablet Oral Weekly  . simvastatin  20 mg Oral QHS  . sodium hypochlorite   Irrigation BID  . vancomycin  1,000 mg Intravenous Q12H  . vitamin C  500 mg Oral Daily  . warfarin  4 mg Oral ONCE-1800  . Warfarin - Pharmacist Dosing Inpatient   Does not apply q1800  . zinc sulfate  220 mg Oral Daily   Continuous Infusions: . sodium chloride 75 mL/hr at 10/02/13 0140     Muadh Creasy, DO  [facility] Pager [phone]  If 7PM-7AM, please contact night-coverage [email or web address] Password [email or web address] 10/02/2013, 10:46 AM   LOS: 7 days

## 2013-10-02 NOTE — Consult Note (Signed)
WOC wound consult note Reason for Consult: Plans to discharge home with wound care.  Wound type: Stage IV pressure ulcer to sacrum, present on admission Pressure Ulcer POA: Yes Measurement: Not assessed this visit.  Dressing procedure/placement/frequency: In with patient to discuss wound care going forward.  Onalee Hua, bedside nurse, has indicated that patient has Acell placed in wound and nonremovable dressing in place. Has had wound debridement. Patient has had full thickness stage IV pressure ulcers in the past and has responded well to Negative Pressure VAC Therapy.  Plans to go back to wound center as she is confident in the services of Dr Leanord Hawking, but also stated she may change to a different wound center [facility]).  If going home with [facility] and under the care of a wound center, would recommend VAC therapy once again.  Discussed at length with patient that pressure relief is necessary to heal and prevent further pressure ulcers.  Patient must rotate between time up in chair and time in bed.  States her alternating pressure bed is 58 years old and her seating surface (ROHO cushion) is 58 years old.  Encouraged patient or family to contact DME provider, [facility] and inquire about replacement items. Verbalized understanding and agreed to rotate time in bed vs. chair and inquire about pressure redistribution surfaces.    Heel wounds are superficial and Allevyn foam paired with offloading pressure to this area should be sufficient.  Will not follow at this time.  Please re-consult if needed.  Maple Hudson RN BSN [facility]ON Pager [phone]

## 2013-10-02 NOTE — Progress Notes (Signed)
[facility]   Day # 5  vancomycin Day # 2 cefazolin  Subjective: mx complaints re not receiving cards from [facility] (besides myself ) mx questions    Antibiotics:  Anti-infectives   Start     Dose/Rate Route Frequency Ordered Stop   10/01/13 1600  ceFAZolin (ANCEF) IVPB 2 g/50 mL premix  Status:  Discontinued     2 g 100 mL/hr over 30 Minutes Intravenous Every 8 hours 10/01/13 1444 10/02/13 1152   09/29/13 1400  vancomycin (VANCOCIN) IVPB 1000 mg/200 mL premix     1,000 mg 200 mL/hr over 60 Minutes Intravenous Every 12 hours 09/29/13 1224     09/29/13 1200  cefTRIAXone (ROCEPHIN) 2 g in dextrose 5 % 50 mL IVPB  Status:  Discontinued     2 g 100 mL/hr over 30 Minutes Intravenous Every 24 hours 09/28/13 1112 10/01/13 1430   09/29/13 0000  vancomycin (VANCOCIN) IVPB 1000 mg/200 mL premix  Status:  Discontinued     1,000 mg 200 mL/hr over 60 Minutes Intravenous Every 12 hours 09/28/13 1112 09/29/13 1224   09/28/13 1100  vancomycin (VANCOCIN) IVPB 1000 mg/200 mL premix     1,000 mg 200 mL/hr over 60 Minutes Intravenous NOW 09/28/13 1055 09/28/13 1518   09/28/13 1100  cefTRIAXone (ROCEPHIN) 2 g in dextrose 5 % 50 mL IVPB     2 g 100 mL/hr over 30 Minutes Intravenous  Once 09/28/13 1055 09/28/13 1308   09/26/13 2200  amoxicillin (AMOXIL) capsule 250 mg  Status:  Discontinued     250 mg Oral Every other day 09/26/13 0950 09/26/13 1607   09/26/13 1000  amoxicillin (AMOXIL) capsule 250 mg  Status:  Discontinued     250 mg Oral Every M-W-F 09/25/13 1745 09/26/13 0950      Medications: Scheduled Meds: . baclofen  20 mg Oral QID  . bisacodyl  10 mg Rectal Weekly  . diazepam  5 mg Oral BID  . enoxaparin (LOVENOX) injection  90 mg Subcutaneous Q12H  . loratadine  10 mg Oral Daily  . multivitamin with minerals  1 tablet Oral Daily  . neomycin-polymyxin B   Irrigation 3 times weekly  . olopatadine  1 drop Both Eyes BID  . oxybutynin  5 mg Oral BID  .  polycarbophil  625 mg Oral QID  . potassium chloride  20 mEq Oral Once  . protein supplement  8 oz Oral q12n4p  . senna  8 tablet Oral Weekly  . simvastatin  20 mg Oral QHS  . sodium hypochlorite   Irrigation BID  . vancomycin  1,000 mg Intravenous Q12H  . vitamin C  500 mg Oral Daily  . warfarin  4 mg Oral ONCE-1800  . Warfarin - Pharmacist Dosing Inpatient   Does not apply q1800  . zinc sulfate  220 mg Oral Daily   Continuous Infusions: . sodium chloride 75 mL/hr at 10/02/13 0140   PRN Meds:.acetaminophen, acetaminophen, acetaminophen-codeine, albuterol, ALPRAZolam, traZODone   Objective: Weight change:   Intake/Output Summary (Last 24 hours) at 10/02/13 1315 Last data filed at 10/02/13 1002  Gross per 24 hour  Intake 2278.75 ml  Output   4650 ml  Net -2371.25 ml   Blood pressure 99/57, pulse 73, temperature 98.2 F (36.8 C), temperature source Oral, resp. rate 16, height 5\' 4"  (1.626 m), weight 195 lb (88.451 kg), SpO2 99.00%. Temp:  [98.2 F (36.8 C)-98.5 F (36.9 C)] 98.2 F (36.8 C) (10/30 0432)  Pulse Rate:  [73] 73 (10/30 0432) Resp:  [16-18] 16 (10/30 0432) BP: (89-99)/(41-57) 99/57 mmHg (10/30 0432) SpO2:  [99 %] 99 % (10/30 0432)  Physical Exam: General: Alert and awake, oriented x3, not in any acute distress. CVS regular rate, normal r,   Chest: clear to auscultation bilaterally, no wheezing, rales or rhonchi Extremities/skin: did not examine dressed wounds Neuro: quadraplegic  Lab Results:  Recent Labs  10/01/13 0110 10/02/13 0810  WBC 6.6 5.3  HGB 8.6* 9.1*  HCT 26.1* 28.3*  PLT 251 240    BMET  Recent Labs  10/01/13 0110 10/02/13 0810  NA 138 140  K 3.3* 3.3*  CL 104 105  CO2 27 27  GLUCOSE 94 127*  BUN 11 6  CREATININE 0.31* 0.34*  CALCIUM 8.3* 8.9    Micro Results: Recent Results (from the past 240 hour(s))  MRSA PCR SCREENING     Status: None   Collection Time    09/25/13  8:11 PM      Result Value Range Status   MRSA by  PCR NEGATIVE  NEGATIVE Final   Comment:            The GeneXpert MRSA Assay (FDA     approved for NASAL specimens     only), is one component of a     comprehensive MRSA colonization     surveillance program. It is not     intended to diagnose MRSA     infection nor to guide or     monitor treatment for     MRSA infections.  URINE CULTURE     Status: None   Collection Time    09/26/13  3:26 PM      Result Value Range Status   Specimen Description URINE, CLEAN CATCH   Final   Special Requests NONE   Final   Culture  Setup Time     Final   Value: 09/26/2013 20:46     Performed at [facility] Count     Final   Value: >=100,000 COLONIES/ML     Performed at [facility]   Culture     Final   Value: Multiple bacterial morphotypes present, none predominant. Suggest appropriate recollection if clinically indicated.     Performed at [facility]   Report Status 09/27/2013 FINAL   Final  TISSUE CULTURE     Status: None   Collection Time    09/28/13  8:32 AM      Result Value Range Status   Specimen Description ULCER SACRAL   Final   Special Requests NONE   Final   Gram Stain     Final   Value: FEW WBC PRESENT, PREDOMINANTLY PMN     NO ORGANISMS SEEN     Performed at [facility]   Culture     Final   Value: ABUNDANT METHICILLIN RESISTANT STAPHYLOCOCCUS AUREUS     Note: RIFAMPIN AND GENTAMICIN SHOULD NOT BE USED AS SINGLE DRUGS FOR TREATMENT OF STAPH INFECTIONS. CRITICAL RESULT CALLED TO, READ BACK BY AND VERIFIED WITH: DAVID [id number]  ON [id number] BY [facility]     Performed at [facility]   Report Status 10/02/2013 FINAL   Final   Organism ID, Bacteria METHICILLIN RESISTANT STAPHYLOCOCCUS AUREUS   Final  TISSUE CULTURE     Status: None   Collection Time    09/28/13  8:33 AM      Result Value Range Status  Specimen Description BONE SACRAL   Final   Special Requests NONE   Final   Gram Stain     Final   Value: FEW WBC PRESENT,  PREDOMINANTLY PMN     NO SQUAMOUS EPITHELIAL CELLS SEEN     NO ORGANISMS SEEN     Performed at [facility]   Culture     Final   Value: FEW ENTEROCOCCUS SPECIES     FEW DIPHTHEROIDS(CORYNEBACTERIUM SPECIES)     Performed at [facility]   Report Status PENDING   Incomplete  CULTURE, BLOOD (ROUTINE X 2)     Status: None   Collection Time    09/29/13 11:42 AM      Result Value Range Status   Specimen Description BLOOD RIGHT HAND   Final   Special Requests     Final   Value: BOTTLES DRAWN AEROBIC AND ANAEROBIC ANA 4CC, AER 5CC   Culture  Setup Time     Final   Value: 09/29/2013 15:19     Performed at [facility]   Culture     Final   Value:        BLOOD CULTURE RECEIVED NO GROWTH TO DATE CULTURE WILL BE HELD FOR 5 DAYS BEFORE ISSUING A FINAL NEGATIVE REPORT     Performed at [facility]   Report Status PENDING   Incomplete  CULTURE, BLOOD (ROUTINE X 2)     Status: None   Collection Time    09/29/13 11:53 AM      Result Value Range Status   Specimen Description BLOOD LEFT ARM   Final   Special Requests     Final   Value: BOTTLES DRAWN AEROBIC AND ANAEROBIC 5CC BOTH BOTTLES   Culture  Setup Time     Final   Value: 09/29/2013 15:19     Performed at [facility]   Culture     Final   Value:        BLOOD CULTURE RECEIVED NO GROWTH TO DATE CULTURE WILL BE HELD FOR 5 DAYS BEFORE ISSUING A FINAL NEGATIVE REPORT     Performed at [facility]   Report Status PENDING   Incomplete    Studies/Results: Ir Fluoro Guide Cv Line Right  10/01/2013   CLINICAL DATA:  Tetraplegia and needs IV antibiotics for infection.  EXAM: PICC LINE PLACEMENT WITH ULTRASOUND AND FLUOROSCOPIC GUIDANCE  FLUOROSCOPY TIME:  18 seconds  PROCEDURE: The patient was advised of the possible risks and complications and agreed to undergo the procedure. The patient was then brought to the angiographic suite for the procedure.  The right/left arm was prepped with  chlorhexidine, draped in the usual sterile fashion using maximum barrier technique (cap and mask, sterile gown, sterile gloves, large sterile sheet, hand hygiene and cutaneous antisepsis) and infiltrated locally with 1% Lidocaine.  Ultrasound demonstrated patency of the right basilic vein, and this was documented with an image. Under real-time ultrasound guidance, this vein was accessed with a 21 gauge micropuncture needle and image documentation was performed. A 0.018 wire was introduced in to the vein. Over this, a 5 [place] single lumen power injectable PICC was advanced to the lower SVC. The PICC line length equals 38 cm. Fluoroscopy during the procedure and fluoro spot radiograph confirms appropriate catheter position. The catheter was flushed and covered with a sterile dressing.  Complications: None  IMPRESSION: Successful right arm Power PICC line placement with ultrasound and fluoroscopic guidance. The catheter is ready for  use.   Electronically Signed   By: Richarda Overlie M.D.   On: 10/01/2013 15:09   Ir US Guide Vasc Access Right  10/01/2013   CLINICAL DATA:  Tetraplegia and needs IV antibiotics for infection.  EXAM: PICC LINE PLACEMENT WITH ULTRASOUND AND FLUOROSCOPIC GUIDANCE  FLUOROSCOPY TIME:  18 seconds  PROCEDURE: The patient was advised of the possible risks and complications and agreed to undergo the procedure. The patient was then brought to the angiographic suite for the procedure.  The right/left arm was prepped with chlorhexidine, draped in the usual sterile fashion using maximum barrier technique (cap and mask, sterile gown, sterile gloves, large sterile sheet, hand hygiene and cutaneous antisepsis) and infiltrated locally with 1% Lidocaine.  Ultrasound demonstrated patency of the right basilic vein, and this was documented with an image. Under real-time ultrasound guidance, this vein was accessed with a 21 gauge micropuncture needle and image documentation was performed. A 0.018 wire was  introduced in to the vein. Over this, a 5 [place] single lumen power injectable PICC was advanced to the lower SVC. The PICC line length equals 38 cm. Fluoroscopy during the procedure and fluoro spot radiograph confirms appropriate catheter position. The catheter was flushed and covered with a sterile dressing.  Complications: None  IMPRESSION: Successful right arm Power PICC line placement with ultrasound and fluoroscopic guidance. The catheter is ready for use.   Electronically Signed   By: Richarda Overlie M.D.   On: 10/01/2013 15:09      Assessment/Plan: ANAYIA EUGENE is a 57 y.o. female with  Quadraplegia, chronic sacral decubitus ulcers sp local debridements and with wound culture from 09/25/13 having grown MRSA R to FQ, clinda S  bactrim, S to [place], as well as a Klebsiella PNA R to AMP, but S to AMP/SUL and all other abx tested now sp I and D of the decubitus ulcer with bone sent for culture growin Staph Aureus. I clarified with Dr. Kelly Splinter and the only bone culture was the one done on 09/28/13  #1 Sacral decubitus ulcer with ischial osteomyelitis sp I and D with MR Staph Aureus growing on culture  --narrow to vancomycin dosed for goal trough of 15-20 to be given IV thru 6 weeks postop with stop date = December 12th, 2014 -- I will ensure [facility] in the next 2-3 weeks in [facility] on [address] and then again at the end of her therapy  #2 --The MRSA and Kleb PNA from wound culture are more superficial and I will not target the Kleb pna  #2 Screening:  HIV pending, Hep C negative in 2013  I will sign off for now, please call with further questions.     LOS: 7 days   Acey Lav 10/02/2013, 1:15 PM

## 2013-10-02 NOTE — Progress Notes (Signed)
ANTICOAGULATION CONSULT NOTE - Follow Up Consult  Pharmacy Consult for lovenox, warfarin Indication: Hx of DVT  Allergies  Allergen Reactions  . Latex    Patient Measurements: Height: 5\' 4"  (162.6 cm) (per patient upon questioned) Weight: 195 lb (88.451 kg) (per patient upon questioned) IBW/kg (Calculated) : 54.7  Labs:  Recent Labs  09/30/13 0535 10/01/13 0110 10/02/13 0810  HGB 8.8* 8.6* 9.1*  HCT 27.6* 26.1* 28.3*  PLT 219 251 240  LABPROT 16.8* 17.5* 19.1*  INR 1.40 1.48 1.66*  CREATININE 0.39* 0.31* 0.34*    Assessment: 57 yoF with multiple decubitus ulcers: sacral, heels, calves appeared worse at [facility], on MRI sacral cellulitis, osteomyelitis and myositis. Admitted for tissue biopsy to guide care then empiric antibiotics. Chronic warfarin held on admission, heparin infusion begun 10/24.  Heparin transitioned to SQ lovenox/warfarin bridge therapy 10/27.   D#4 LMWH/Warfarin bridge  INR subtherapeutic = 1.66   Home dose warfarin reported as 3mg  po daily.   Hgb better.   No overt bleeding reported.  Pltc stable.  Given only history of DVT, does not necessary need Lovenox until INR is therapeutic.   Possible discharge today   Goal of Therapy:  INR 2-3 Monitor platelets by anticoagulation protocol: Yes    Plan:   Continue Lovenox 90 mg (1mg /kg) SQ q12h.  Coumadin 4mg  PO x 1 tonight  If patient is being discharged today, recommend give 4mg  prior to discharge then continue 3mg  po daily starting 10/31. No Lovenox needed.   PT/INR daily    Geoffry Paradise, PharmD, BCPS Pager: [phone] 10:29 AM Pharmacy #: [phone]

## 2013-10-03 ENCOUNTER — Other Ambulatory Visit: Payer: Self-pay | Admitting: Internal Medicine

## 2013-10-03 DIAGNOSIS — I959 Hypotension, unspecified: Secondary | ICD-10-CM

## 2013-10-03 DIAGNOSIS — M869 Osteomyelitis, unspecified: Secondary | ICD-10-CM

## 2013-10-03 LAB — TISSUE CULTURE

## 2013-10-03 LAB — CBC
HCT: 27.1 % — ABNORMAL LOW (ref 36.0–46.0)
Hemoglobin: 8.9 g/dL — ABNORMAL LOW (ref 12.0–15.0)
MCV: 93.4 fL (ref 78.0–100.0)
RBC: 2.9 MIL/uL — ABNORMAL LOW (ref 3.87–5.11)
RDW: 14.3 % (ref 11.5–15.5)
WBC: 5.9 10*3/uL (ref 4.0–10.5)

## 2013-10-03 LAB — BASIC METABOLIC PANEL
CO2: 29 mEq/L (ref 19–32)
Chloride: 103 mEq/L (ref 96–112)
Glucose, Bld: 107 mg/dL — ABNORMAL HIGH (ref 70–99)
Potassium: 3.6 mEq/L (ref 3.5–5.1)
Sodium: 137 mEq/L (ref 135–145)

## 2013-10-03 LAB — HIV-1 RNA QUANT-NO REFLEX-BLD
HIV 1 RNA Quant: <20 copies/mL (ref <20)
HIV-1 RNA Quant, Log: <1.3 {Log} (ref <1.30)

## 2013-10-03 MED ORDER — ZINC SULFATE 220 (50 ZN) MG PO CAPS: 220.0000 mg | ORAL_CAPSULE | Freq: Every day | ORAL | Status: AC

## 2013-10-03 MED ORDER — WARFARIN SODIUM 4 MG PO TABS
4.0000 mg | ORAL_TABLET | Freq: Once | ORAL | Status: AC
  Administered 2013-10-03: 4 mg via ORAL
  Filled 2013-10-03: qty 1

## 2013-10-03 MED ORDER — ASCORBIC ACID 500 MG PO TABS: 500.0000 mg | ORAL_TABLET | Freq: Every day | ORAL | Status: AC

## 2013-10-03 MED ORDER — HEPARIN SOD (PORK) LOCK FLUSH 100 UNIT/ML IV SOLN
250.0000 [IU] | INTRAVENOUS | Status: AC | PRN
  Administered 2013-10-03: 500 [IU]

## 2013-10-03 MED ORDER — VANCOMYCIN HCL IN DEXTROSE 1-5 GM/200ML-% IV SOLN: 1000.0000 mg | Freq: Two times a day (BID) | INTRAVENOUS | Status: DC

## 2013-10-03 NOTE — Progress Notes (Signed)
ANTICOAGULATION CONSULT NOTE - Follow Up Consult  Pharmacy Consult for Lovenox, Coumadin Indication: Hx of DVT  Allergies  Allergen Reactions  . Latex    Labs:  Recent Labs  10/01/13 0110 10/02/13 0810 10/02/13 1420 10/03/13 0435  HGB 8.6* 9.1*  --  8.9*  HCT 26.1* 28.3*  --  27.1*  PLT 251 240  --  242  LABPROT 17.5* 19.1*  --  19.9*  INR 1.48 1.66*  --  1.75*  CREATININE 0.31* 0.34* 0.30*  --     Assessment: 47 yoF with multiple decubitus ulcers: sacral, heels, calves appeared worse at [facility], on MRI sacral cellulitis, osteomyelitis and myositis. Admitted for tissue biopsy to guide care then empiric antibiotics. Chronic warfarin held on admission, heparin infusion begun 10/24.  Heparin transitioned to SQ lovenox/warfarin bridge therapy 10/27.   D#5 LMWH/Warfarin bridge  INR subtherapeutic = 1.75  Home dose warfarin reported as 3mg  po daily.   Hgb 8.9, no overt bleeding reported. Pltc stable.  Given only history of DVT, Lovenox is not needed until INR is therapeutic.   Plan discharge to home today   Goal of Therapy:  INR 2-3 Monitor platelets by anticoagulation protocol: Yes    Plan:   Coumadin 4mg  PO x 1 tonight (can take inpatient dose prior to leaving) then at discharge can resume home dose of 3mg  po daily starting 11/1.   Lovenox is not needed at discharge.   PT/INR daily    Geoffry Paradise, PharmD, BCPS Pager: [phone] 8:03 AM Pharmacy #: [id number]

## 2013-10-03 NOTE — Plan of Care (Signed)
Problem: Phase III Progression Outcomes Goal: Voiding independently Outcome: Not Met (add Reason) Suprapubic cath

## 2013-10-03 NOTE — Progress Notes (Signed)
CARE MANAGEMENT NOTE 10/03/2013  Patient:  [name], [name]   Account Number:  [id number]  Date Initiated:  09/29/2013  Documentation initiated by:  [facility]  Subjective/Objective Assessment:   57 year old female admitted with stage 4 sacral ulcer thats worsening.     Action/Plan:   From home with [facility] services.   Anticipated DC Date:  10/03/2013   Anticipated DC Plan:  HOME W HOME HEALTH SERVICES      DC Planning Services  CM consult      Choice offered to / List presented to:  C-1 Patient        HH arranged  HH-1 RN  HH-2 PT  HH-4 NURSE'S AIDE  HH-6 SOCIAL WORKER      HH agency  [facility]   Status of service:  In process, will continue to follow Medicare Important Message given?  NA - LOS <3 / Initial given by admissions   Per UR Regulation:  Reviewed for med. necessity/level of care/duration of stay  If discussed at Long Length of Stay Meetings, dates discussed:   09/30/2013   Comments:  10/03/13 Algernon Huxley RN BSN [phone] Per Cala Bradford at [facility], they will not be able to provide [facility] services for her anymore. This information was passed on to the pt, she chose [facility] to provide services to her. Referral made.  I discussed with pharmacy the timing of the Vancomycin administration. They advised to give the 2pm dose as late as possible today after d/c and then [facility] can start at 8am tomorrow.  Follow up appt made with Dr Wiliam Ke at [facility] on 11/4 @ 3pm

## 2013-10-03 NOTE — Progress Notes (Signed)
[facility]  [facility] is providing the following services: Low Air Loss Mattress  If patient discharges after hours, please call [phone].   Renard Hamper 10/03/2013, 2:17 PM

## 2013-10-03 NOTE — Progress Notes (Signed)
Patient discharged home with [facility] in stable condition.  No change from morning assessment.  Wound care provided, vancomycin hung, and coumadin given prior to discharge.  Instructions and scripts given to pt with verbal feedback and understanding.  MyChart active

## 2013-10-03 NOTE — Plan of Care (Signed)
Problem: Phase I Progression Outcomes Goal: Voiding-avoid urinary catheter unless indicated Outcome: Not Met (add Reason) Suprapubic cath     

## 2013-10-04 DIAGNOSIS — Z0279 Encounter for issue of other medical certificate: Secondary | ICD-10-CM

## 2013-10-05 LAB — CULTURE, BLOOD (ROUTINE X 2): Culture: NO GROWTH

## 2013-10-06 ENCOUNTER — Ambulatory Visit: Payer: Medicare Other | Admitting: General Practice

## 2013-10-06 LAB — POCT INR: INR: 2.9

## 2013-10-07 ENCOUNTER — Encounter (HOSPITAL_BASED_OUTPATIENT_CLINIC_OR_DEPARTMENT_OTHER): Payer: Medicare Other | Attending: General Surgery

## 2013-10-07 DIAGNOSIS — L8994 Pressure ulcer of unspecified site, stage 4: Secondary | ICD-10-CM | POA: Insufficient documentation

## 2013-10-07 DIAGNOSIS — L8993 Pressure ulcer of unspecified site, stage 3: Secondary | ICD-10-CM | POA: Insufficient documentation

## 2013-10-07 DIAGNOSIS — I87309 Chronic venous hypertension (idiopathic) without complications of unspecified lower extremity: Secondary | ICD-10-CM | POA: Insufficient documentation

## 2013-10-07 DIAGNOSIS — L89309 Pressure ulcer of unspecified buttock, unspecified stage: Secondary | ICD-10-CM | POA: Insufficient documentation

## 2013-10-07 DIAGNOSIS — L89899 Pressure ulcer of other site, unspecified stage: Secondary | ICD-10-CM | POA: Insufficient documentation

## 2013-10-08 ENCOUNTER — Telehealth: Payer: Self-pay | Admitting: Internal Medicine

## 2013-10-08 DIAGNOSIS — L89154 Pressure ulcer of sacral region, stage 4: Secondary | ICD-10-CM

## 2013-10-08 DIAGNOSIS — G8250 Quadriplegia, unspecified: Secondary | ICD-10-CM

## 2013-10-08 NOTE — Telephone Encounter (Signed)
I will place order to [facility] asking to reassess mattress Thanks for the note

## 2013-10-08 NOTE — Telephone Encounter (Signed)
[facility] brought out an air mattress to help with the bedsore.  It was very hard.  She wants a different one.

## 2013-10-09 NOTE — Telephone Encounter (Signed)
I called the patient to let her know. She is also requesting that [facility] come to do a bowel program 1-2 times a week.

## 2013-10-10 NOTE — Telephone Encounter (Signed)
Not sure but i will place order with [facility] to check on same

## 2013-10-14 ENCOUNTER — Ambulatory Visit (INDEPENDENT_AMBULATORY_CARE_PROVIDER_SITE_OTHER): Payer: Medicare Other | Admitting: General Practice

## 2013-10-14 LAB — POCT INR: INR: 3.9

## 2013-10-15 ENCOUNTER — Telehealth: Payer: Self-pay | Admitting: *Deleted

## 2013-10-15 NOTE — Telephone Encounter (Signed)
please forward any and all calls for INR to Bailey Mech as per office protocol I have do so now thanks

## 2013-10-15 NOTE — Telephone Encounter (Signed)
Called reporting INR result of 4.2 on 11.11.14

## 2013-10-16 ENCOUNTER — Telehealth: Payer: Self-pay | Admitting: *Deleted

## 2013-10-16 ENCOUNTER — Ambulatory Visit (INDEPENDENT_AMBULATORY_CARE_PROVIDER_SITE_OTHER): Payer: Medicare Other | Admitting: Infectious Diseases

## 2013-10-16 VITALS — BP 126/84 | HR 75 | Temp 98.1°F

## 2013-10-16 DIAGNOSIS — L8994 Pressure ulcer of unspecified site, stage 4: Secondary | ICD-10-CM

## 2013-10-16 DIAGNOSIS — L89109 Pressure ulcer of unspecified part of back, unspecified stage: Secondary | ICD-10-CM

## 2013-10-16 DIAGNOSIS — A4902 Methicillin resistant Staphylococcus aureus infection, unspecified site: Secondary | ICD-10-CM

## 2013-10-16 DIAGNOSIS — N319 Neuromuscular dysfunction of bladder, unspecified: Secondary | ICD-10-CM

## 2013-10-16 DIAGNOSIS — L89154 Pressure ulcer of sacral region, stage 4: Secondary | ICD-10-CM

## 2013-10-16 NOTE — Assessment & Plan Note (Addendum)
Will plan on her anbx to stop on November 10, 2013 at 6 weeks. Will ask home health to recheck her ESR (previously 132 in October 2014) as well as to pull her [facility] on November 10, 2013. Will have her seen back in ID clinic at or around Dec 8. She will continue to f/u at [facility] (although she has some reluctance with regards to this). She would like Dr Felicity Coyer to consider taking over management of her wounds, I asked pt to also consider [facility] or [facility] as alternatives.

## 2013-10-16 NOTE — Telephone Encounter (Signed)
ESR added to next lab draw - 11/17. Last dose of iv vancomycin to be administered 12/7 at home.  [name] RN to come 12/8 to pull PICC at home.   Andree Coss, RN
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
## 2013-10-20 ENCOUNTER — Inpatient Hospital Stay: Payer: Medicare Other | Admitting: Internal Medicine

## 2013-10-20 ENCOUNTER — Ambulatory Visit (INDEPENDENT_AMBULATORY_CARE_PROVIDER_SITE_OTHER): Payer: Medicare Other | Admitting: General Practice

## 2013-10-20 DIAGNOSIS — Z7901 Long term (current) use of anticoagulants: Secondary | ICD-10-CM

## 2013-10-20 LAB — POCT INR: INR: 1.6

## 2013-10-20 LAB — CBC AND DIFFERENTIAL
HCT: 30 % — AB (ref 36–46)
Hemoglobin: 10.2 g/dL — AB (ref 12.0–16.0)
WBC: 9.1 10^3/mL

## 2013-10-21 ENCOUNTER — Encounter: Payer: Self-pay | Admitting: Internal Medicine

## 2013-10-28 ENCOUNTER — Ambulatory Visit (INDEPENDENT_AMBULATORY_CARE_PROVIDER_SITE_OTHER): Payer: Medicare Other | Admitting: General Practice

## 2013-11-03 ENCOUNTER — Ambulatory Visit (INDEPENDENT_AMBULATORY_CARE_PROVIDER_SITE_OTHER): Payer: Medicare Other | Admitting: General Practice

## 2013-11-03 ENCOUNTER — Telehealth: Payer: Self-pay | Admitting: Internal Medicine

## 2013-11-03 DIAGNOSIS — Z0279 Encounter for issue of other medical certificate: Secondary | ICD-10-CM

## 2013-11-03 LAB — POCT INR: INR: 4.2

## 2013-11-03 NOTE — Telephone Encounter (Signed)
11/03/2013  Pt needs a referral to a rehab facility.  Please contact pt in regards.

## 2013-11-04 ENCOUNTER — Other Ambulatory Visit: Payer: Self-pay | Admitting: Internal Medicine

## 2013-11-04 MED ORDER — WARFARIN SODIUM 3 MG PO TABS: ORAL_TABLET | ORAL | Status: DC

## 2013-11-04 NOTE — Telephone Encounter (Signed)
Called pt she stated she have appt on Monday 11/10/13 wanted to get another will chair. Social worker got her set-up with [facility] and there rep is coming in with pt to do face to face...[name]

## 2013-11-04 NOTE — Telephone Encounter (Signed)
I believe a referral for this was done last week - Please followup with pt on same - has she heard from her requested facility?  If not, what rehab facility is she requesting and what services?

## 2013-11-06 ENCOUNTER — Encounter (HOSPITAL_BASED_OUTPATIENT_CLINIC_OR_DEPARTMENT_OTHER): Payer: Medicare Other | Attending: Internal Medicine

## 2013-11-06 ENCOUNTER — Ambulatory Visit: Payer: Medicare Other | Admitting: General Practice

## 2013-11-06 DIAGNOSIS — M86659 Other chronic osteomyelitis, unspecified thigh: Secondary | ICD-10-CM | POA: Insufficient documentation

## 2013-11-06 DIAGNOSIS — B952 Enterococcus as the cause of diseases classified elsewhere: Secondary | ICD-10-CM | POA: Insufficient documentation

## 2013-11-06 DIAGNOSIS — E785 Hyperlipidemia, unspecified: Secondary | ICD-10-CM | POA: Insufficient documentation

## 2013-11-06 DIAGNOSIS — L89609 Pressure ulcer of unspecified heel, unspecified stage: Secondary | ICD-10-CM | POA: Insufficient documentation

## 2013-11-06 DIAGNOSIS — L8990 Pressure ulcer of unspecified site, unspecified stage: Secondary | ICD-10-CM | POA: Insufficient documentation

## 2013-11-06 DIAGNOSIS — Z86718 Personal history of other venous thrombosis and embolism: Secondary | ICD-10-CM | POA: Insufficient documentation

## 2013-11-06 DIAGNOSIS — F411 Generalized anxiety disorder: Secondary | ICD-10-CM | POA: Insufficient documentation

## 2013-11-06 DIAGNOSIS — Z79899 Other long term (current) drug therapy: Secondary | ICD-10-CM | POA: Insufficient documentation

## 2013-11-06 DIAGNOSIS — N319 Neuromuscular dysfunction of bladder, unspecified: Secondary | ICD-10-CM | POA: Insufficient documentation

## 2013-11-06 DIAGNOSIS — G8250 Quadriplegia, unspecified: Secondary | ICD-10-CM | POA: Insufficient documentation

## 2013-11-06 DIAGNOSIS — Z7901 Long term (current) use of anticoagulants: Secondary | ICD-10-CM | POA: Insufficient documentation

## 2013-11-06 DIAGNOSIS — R609 Edema, unspecified: Secondary | ICD-10-CM | POA: Insufficient documentation

## 2013-11-07 ENCOUNTER — Ambulatory Visit: Payer: Medicare Other | Admitting: Internal Medicine

## 2013-11-10 ENCOUNTER — Encounter: Payer: Self-pay | Admitting: Internal Medicine

## 2013-11-10 ENCOUNTER — Ambulatory Visit (INDEPENDENT_AMBULATORY_CARE_PROVIDER_SITE_OTHER): Payer: Medicare Other | Admitting: Internal Medicine

## 2013-11-10 ENCOUNTER — Ambulatory Visit: Payer: Medicare Other | Admitting: General Practice

## 2013-11-10 VITALS — BP 100/60 | HR 76 | Temp 98.1°F | Resp 16

## 2013-11-10 DIAGNOSIS — I82409 Acute embolism and thrombosis of unspecified deep veins of unspecified lower extremity: Secondary | ICD-10-CM

## 2013-11-10 DIAGNOSIS — G8250 Quadriplegia, unspecified: Secondary | ICD-10-CM

## 2013-11-10 DIAGNOSIS — L89109 Pressure ulcer of unspecified part of back, unspecified stage: Secondary | ICD-10-CM

## 2013-11-10 DIAGNOSIS — G8253 Quadriplegia, C5-C7 complete: Secondary | ICD-10-CM

## 2013-11-10 DIAGNOSIS — L8994 Pressure ulcer of unspecified site, stage 4: Secondary | ICD-10-CM

## 2013-11-10 DIAGNOSIS — Z5181 Encounter for therapeutic drug level monitoring: Secondary | ICD-10-CM

## 2013-11-10 DIAGNOSIS — L89154 Pressure ulcer of sacral region, stage 4: Secondary | ICD-10-CM

## 2013-11-10 MED ORDER — FLUCONAZOLE 200 MG PO TABS: 200.0000 mg | ORAL_TABLET | Freq: Every day | ORAL | Status: DC | PRN

## 2013-11-10 NOTE — Progress Notes (Signed)
Pre-visit discussion using our clinic review tool. No additional management support is needed unless otherwise documented below in the visit note.  
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
## 2013-11-10 NOTE — Patient Instructions (Signed)
It was good to see you today.  We have reviewed your prior records including labs and tests today  We have completed paperwork today for the exam to obtain new power wheelchair -continue working with [facility] on same  Will refer to advanced for low air loss overlay mattress as requested  Medications reviewed and updated, no changes recommended at this time. Refill on Diflucan provided as requested  Followup as scheduled spring 2015, please call sooner if needed

## 2013-11-12 ENCOUNTER — Telehealth: Payer: Self-pay | Admitting: *Deleted

## 2013-11-12 ENCOUNTER — Encounter: Payer: Self-pay | Admitting: Internal Medicine

## 2013-11-12 NOTE — Telephone Encounter (Signed)
Pt wondering if MD wants to start her on oral antibiotics.  Has transportation issues to come for appt previously scheduled appt for 11/13/13.  She has recently been seen by Dr Leanord Hawking re:  difficult to heal wounds.  There is a possibility that the pt will be admitted to SNF, [facility], for wound care/management.  Dr. Leanord Hawking asked that the PICC remain until this possible admission.  MD please advise about oral antibiotics.

## 2013-11-12 NOTE — Telephone Encounter (Signed)
I don't see any indication or mention in Dr. Moshe Cipro note to continue with oral antibiotics after the 6 weeks.  Antibiotics can be stopped and picc pulled.  If Dr. Leanord Hawking wants to continue the picc, just have him let the facility know when to pull it.    Thanks

## 2013-11-13 ENCOUNTER — Ambulatory Visit: Payer: Medicare Other | Admitting: Internal Medicine

## 2013-11-13 ENCOUNTER — Encounter: Payer: Self-pay | Admitting: Internal Medicine

## 2013-11-13 ENCOUNTER — Telehealth: Payer: Self-pay | Admitting: *Deleted

## 2013-11-13 NOTE — Telephone Encounter (Signed)
Pt called requesting completed FL2 be faxed to [facility], states forms expired 12.10.14.  Please advise

## 2013-11-13 NOTE — Telephone Encounter (Signed)
RN spoke with pt.  Relayed Dr. Ephriam Knuckles note.  Pt verbalized understanding.

## 2013-11-14 NOTE — Telephone Encounter (Signed)
Spoke with pt advised new forms needed.

## 2013-11-14 NOTE — Telephone Encounter (Signed)
?  if i signed these yesterday AM - check with Valentina Gu If no, I will sign on my return to the office Monday thanks

## 2013-11-18 ENCOUNTER — Ambulatory Visit (INDEPENDENT_AMBULATORY_CARE_PROVIDER_SITE_OTHER): Payer: Medicare Other | Admitting: General Practice

## 2013-11-18 LAB — POCT INR: INR: 2.7

## 2013-11-18 NOTE — Progress Notes (Signed)
Pre-visit discussion using our clinic review tool. No additional management support is needed unless otherwise documented below in the visit note.  

## 2013-11-21 ENCOUNTER — Non-Acute Institutional Stay (SKILLED_NURSING_FACILITY): Payer: Medicare Other | Admitting: Internal Medicine

## 2013-11-21 DIAGNOSIS — L89609 Pressure ulcer of unspecified heel, unspecified stage: Secondary | ICD-10-CM

## 2013-11-21 DIAGNOSIS — Z9889 Other specified postprocedural states: Secondary | ICD-10-CM

## 2013-11-21 DIAGNOSIS — L8990 Pressure ulcer of unspecified site, unspecified stage: Secondary | ICD-10-CM

## 2013-11-21 DIAGNOSIS — L8994 Pressure ulcer of unspecified site, stage 4: Secondary | ICD-10-CM
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
## 2013-12-05 ENCOUNTER — Non-Acute Institutional Stay (SKILLED_NURSING_FACILITY): Payer: Medicare Other | Admitting: Internal Medicine

## 2013-12-05 DIAGNOSIS — L8990 Pressure ulcer of unspecified site, unspecified stage: Secondary | ICD-10-CM

## 2013-12-05 DIAGNOSIS — L8994 Pressure ulcer of unspecified site, stage 4: Secondary | ICD-10-CM

## 2013-12-05 DIAGNOSIS — L89609 Pressure ulcer of unspecified heel, unspecified stage: Secondary | ICD-10-CM

## 2013-12-05 NOTE — Progress Notes (Signed)
Patient ID: [name], female   DOB: 03/11/1956, 58 y.o.   MRN: [id number]

## 2013-12-15 NOTE — Progress Notes (Addendum)
Patient ID: [name], female   DOB: 1956/04/20, 58 y.o.   MRN: [id number]               PROGRESS NOTE  DATE:  12/05/2013    FACILITY: [facility]    LEVEL OF CARE:   SNF   Acute Visit   CHIEF COMPLAINT:  Wound review.    HISTORY OF PRESENT ILLNESS:  [name] is a patient who I follow in the [facility].  She was admitted to SNF in cooperation with her insurance company for further treatment of her bilateral wounds over the achilles area as well as a wound over the right ischial tuberosity.  She was recently in hospital.  She was diagnosed with osteomyelitis.  She has completed her antibiotics, although her PICC line is still in place.  She was admitted to the facility for aggressive wound care, offloading, pulse lavage.    PHYSICAL EXAMINATION:   SKIN:  INSPECTION:  The areas on her heels are considerably better.  The area over her right ischial tuberosity is healthy whereas there was slough over what I assumed to be periosteum when she came in here.  This now has reasonably healthy albeit thin layers of granulation.   I am really quite pleased to see how both of these areas are going.    ASSESSMENT/PLAN:  Stage IV wound over the right ischial tuberosity.  There has been improvement here and it is nice to see this looking as healthy as it is.  I have cautioned the patient directly and spoken to her daughter about issues such as going home and spending long periods of time with occlusive pressure over this site.  I have cautioned her that this can cause rapid wound deterioration in the space of half a day.  She expresses understanding.  The areas over the achilles areas are also much better.    Urology has prescribed Neosporin irrigation to her suprapubic catheter which, in itself, is an obscene amount of money.  I know of no particular benefit that has been proven with this type of approach, albeit I would bow to the urologist's knowledge about this.   In any  case, this  apparently is extremely expensive.  I have no expectation that the facility will pay the cost of this.    Underlying osteomyelitis in the right buttock wound.   Her C-reactive protein is 28.4.  This is a lot better.  Sedimentation rate is down to 16.  She has completed her antibiotics.

## 2013-12-18 ENCOUNTER — Non-Acute Institutional Stay (SKILLED_NURSING_FACILITY): Payer: Medicare Other | Admitting: Internal Medicine

## 2013-12-18 ENCOUNTER — Other Ambulatory Visit: Payer: Self-pay | Admitting: Internal Medicine

## 2013-12-18 DIAGNOSIS — K5900 Constipation, unspecified: Secondary | ICD-10-CM | POA: Insufficient documentation

## 2013-12-18 DIAGNOSIS — E785 Hyperlipidemia, unspecified: Secondary | ICD-10-CM

## 2013-12-18 DIAGNOSIS — E876 Hypokalemia: Secondary | ICD-10-CM

## 2013-12-18 DIAGNOSIS — D649 Anemia, unspecified: Secondary | ICD-10-CM | POA: Insufficient documentation

## 2013-12-18 DIAGNOSIS — D638 Anemia in other chronic diseases classified elsewhere: Secondary | ICD-10-CM

## 2013-12-18 NOTE — Progress Notes (Signed)
         PROGRESS NOTE  DATE: 12/18/2013  FACILITY: Nursing Home Location: [facility]  LEVEL OF CARE: SNF (31)  Routine Visit  CHIEF COMPLAINT:  Manage hyperlipidemia, hypokalemia and constipation  HISTORY OF PRESENT ILLNESS:  REASSESSMENT OF ONGOING PROBLEM(S):  HYPERLIPIDEMIA: No complications from the medications presently being used. Last fasting lipid panel showed not available.  CONSTIPATION: The constipation remains stable. No complications from the medications presently being used. Patient denies ongoing constipation, abdominal pain, nausea or vomiting.  HYPOKALEMIA: The patient's hypokalemia remains stable. Patient denies muscle cramping or palpitations. No complications reported from current potassium supplementation.  PAST MEDICAL HISTORY : Reviewed.  No changes.  CURRENT MEDICATIONS: Reviewed per [facility]  REVIEW OF SYSTEMS:  GENERAL: no change in appetite, no fatigue, no weight changes, no fever, chills or weakness RESPIRATORY: no cough, SOB, DOE, wheezing, hemoptysis CARDIAC: no chest pain, edema or palpitations GI: no abdominal pain, diarrhea, constipation, heart burn, nausea or vomiting  PHYSICAL EXAMINATION  VS:  T 98.6      P 80     RR 20      BP 118/88     POX %     WT (Lb) 177.2  GENERAL: no acute distress, moderately obese body habitus EYES: conjunctivae normal, sclerae normal, normal eye lids NECK: supple, trachea midline, no neck masses, no thyroid tenderness, no thyromegaly LYMPHATICS: no LAN in the neck, no supraclavicular LAN RESPIRATORY: breathing is even & unlabored, BS CTAB CARDIAC: RRR, no murmur,no extra heart sounds, no edema GI: abdomen soft, normal BS, no masses, no tenderness, no hepatomegaly, no splenomegaly PSYCHIATRIC: the patient is alert & oriented to person, affect & behavior appropriate  LABS/RADIOLOGY:  12-14 BMP normal, magnesium 1.9, hemoglobin 8.7, MCV 90 otherwise CBC normal, albumin 2.9 otherwise liver  profile normal  ASSESSMENT/PLAN:  Hyperlipidemia-check fasting lipid panel Hypokalemia-repleted Constipation-well-controlled Anemia of chronic disease-stable Neurogenic bladder-has chronic foley, continue ditropan Allergic rhinitis-well-controlled Allergic conjunctivitis- continue Patanol UTI prophylaxis-start amoxicillin 250 mg 3 times a week DVT-continue Coumadin   CPT CODE: 16109

## 2013-12-22 ENCOUNTER — Non-Acute Institutional Stay (SKILLED_NURSING_FACILITY): Payer: Medicare Other | Admitting: Family

## 2013-12-22 DIAGNOSIS — Z9889 Other specified postprocedural states: Secondary | ICD-10-CM

## 2013-12-22 DIAGNOSIS — R509 Fever, unspecified: Secondary | ICD-10-CM

## 2013-12-22 DIAGNOSIS — L8990 Pressure ulcer of unspecified site, unspecified stage: Secondary | ICD-10-CM

## 2013-12-22 DIAGNOSIS — L8994 Pressure ulcer of unspecified site, stage 4: Secondary | ICD-10-CM

## 2013-12-23 ENCOUNTER — Non-Acute Institutional Stay (SKILLED_NURSING_FACILITY): Payer: Medicare Other | Admitting: Internal Medicine

## 2013-12-23 DIAGNOSIS — L89609 Pressure ulcer of unspecified heel, unspecified stage: Secondary | ICD-10-CM

## 2013-12-23 DIAGNOSIS — L8990 Pressure ulcer of unspecified site, unspecified stage: Secondary | ICD-10-CM

## 2013-12-23 DIAGNOSIS — L8994 Pressure ulcer of unspecified site, stage 4: Secondary | ICD-10-CM

## 2013-12-23 NOTE — Progress Notes (Signed)
Patient ID: [name], female   DOB: Jun 26, 1956, 58 y.o.   MRN:               PROGRESS NOTE  DATE:  12/23/2013    FACILITY: [facility]    LEVEL OF CARE:   SNF   Acute Visit   CHIEF COMPLAINT:  Wound review.    HISTORY OF PRESENT ILLNESS:  [name] is a patient who I follow in the [facility].  She was admitted to SNF in cooperation with her insurance company for further treatment of her bilateral wounds over the achilles area as well as a wound over the right ischial tuberosity.  She was recently in hospital.  She was diagnosed with osteomyelitis.  She has completed her antibiotics, although her PICC line is still in place.  She was admitted to the facility for aggressive wound care, offloading, pulse lavage.    PHYSICAL EXAMINATION:   SKIN:  INSPECTION:  The areas on her heels are considerably better. The area on the right heel still is a substantial wound although has healthy granulation. The area on the left is just about completely closed over. The area over her right ischial tuberosity has improved tremendously with reduction of the wound volume and a very healthy vibrant granulation. There is no evidence of infection.  ASSESSMENT/PLAN:  Stage IV wound over the right ischial tuberosity.  There has been considerable improvement in the condition of this wound. We will continue the wound VAC as there still is that here that needs to be filled in  Osteomyelitis underneath the wound listed above. There is no reason to suspect that this is still active she has completed her antibiotics  Bilateral heel wounds which were pressure wounds the one on the left is just about closed the one on the right still needs some work up

## 2014-01-02 ENCOUNTER — Non-Acute Institutional Stay (SKILLED_NURSING_FACILITY): Payer: Medicare Other | Admitting: Internal Medicine

## 2014-01-02 DIAGNOSIS — L8990 Pressure ulcer of unspecified site, unspecified stage: Secondary | ICD-10-CM

## 2014-01-02 DIAGNOSIS — L8994 Pressure ulcer of unspecified site, stage 4: Secondary | ICD-10-CM

## 2014-01-02 DIAGNOSIS — L89609 Pressure ulcer of unspecified heel, unspecified stage: Secondary | ICD-10-CM

## 2014-01-02 NOTE — Progress Notes (Signed)
Patient ID: PAIZLEIGH WILDS, female   DOB: Jun 07, 1956, 58 y.o.   MRN: [id number]              PROGRESS NOTE  DATE:  12/23/2013    FACILITY: [facility]    LEVEL OF CARE:   SNF   Acute Visit   CHIEF COMPLAINT:  Wound review.    HISTORY OF PRESENT ILLNESS:  Mrs. Stapleton is a patient who I follow in the [facility].  She was admitted to SNF in cooperation with her insurance company for further treatment of her bilateral wounds over the achilles area as well as a wound over the right ischial tuberosity.  She was recently in hospital.  She was diagnosed with osteomyelitis.  She has completed her antibiotics, although her PICC line is still in place.  She was admitted to the facility for aggressive wound care, offloading, pulse lavage.    We have been making tremendous progress in both her pressure ulcers over her heels as well as the area on the right ischial tuberosity. Seeing both of these and followup  PHYSICAL EXAMINATION:   SKIN:  INSPECTION:  The areas on her heels are considerably better. The area on the right heel still is a substantial wound although has healthy granulation. The area on the left is just about completely closed over. The area over her right ischial tuberosity has improved tremendously with the overall volume being roughly the size of my index finger up to the PIP. There is no evidence of infection  ASSESSMENT/PLAN:  Stage IV wound over the right ischial tuberosity.  There has been considerable improvement since I last saw this wound. Continue the wound VAC until the granulation fills them in and at that point I think she can be to change to surface dressings. I have counseled her that we need to find some way to translate the offloading that has been done in the facility to home  Osteomyelitis underneath the wound listed above. There is no reason to suspect that this is still active she has completed her antibiotics. DC PICC line  Bilateral heel  wounds which were pressure wounds the one on the left is just about closed the one on the right still needs some work up

## 2014-01-08 ENCOUNTER — Encounter: Payer: Self-pay | Admitting: *Deleted

## 2014-01-08 ENCOUNTER — Non-Acute Institutional Stay (SKILLED_NURSING_FACILITY): Payer: Medicare Other | Admitting: Internal Medicine

## 2014-01-08 ENCOUNTER — Other Ambulatory Visit: Payer: Self-pay | Admitting: *Deleted

## 2014-01-08 DIAGNOSIS — G894 Chronic pain syndrome: Secondary | ICD-10-CM

## 2014-01-08 DIAGNOSIS — H1045 Other chronic allergic conjunctivitis: Secondary | ICD-10-CM

## 2014-01-08 DIAGNOSIS — R7309 Other abnormal glucose: Secondary | ICD-10-CM

## 2014-01-08 DIAGNOSIS — R739 Hyperglycemia, unspecified: Secondary | ICD-10-CM

## 2014-01-08 DIAGNOSIS — H1010 Acute atopic conjunctivitis, unspecified eye: Secondary | ICD-10-CM

## 2014-01-08 DIAGNOSIS — D638 Anemia in other chronic diseases classified elsewhere: Secondary | ICD-10-CM

## 2014-01-08 MED ORDER — ACETAMINOPHEN-CODEINE #3 300-30 MG PO TABS: ORAL_TABLET | ORAL | Status: DC

## 2014-01-08 NOTE — Telephone Encounter (Signed)
[facility] 

## 2014-01-09 DIAGNOSIS — H1010 Acute atopic conjunctivitis, unspecified eye: Secondary | ICD-10-CM | POA: Insufficient documentation

## 2014-01-09 DIAGNOSIS — G894 Chronic pain syndrome: Secondary | ICD-10-CM | POA: Insufficient documentation

## 2014-01-09 NOTE — Progress Notes (Signed)
         PROGRESS NOTE  DATE: 01/08/2014  FACILITY:  [facility]  LEVEL OF CARE: SNF (31)  Acute Visit  CHIEF COMPLAINT:  Manage anemia of chronic disease, hyperglycemia and an allergic conjunctivitis  HISTORY OF PRESENT ILLNESS: I was requested by the staff to assess the patient regarding above problem(s):  ANEMIA: The anemia has been stable. The patient denies fatigue, melena or hematochezia. No complications from the medications currently being used. 2 -- 3-15 hemoglobin 9.9, MCV 92. In 2-14 hemoglobin 8.7.  HYPERGLYCEMIA: New problem. On 01-06-14 glucose level 134. Patient does not have a history of diabetes mellitus. Patient denies polyuria or polydipsia.  ALLERGIC CONJUNCTIVITIS: Unstable problem. Patient is currently on on Patanol once daily and tolerates it without any side effects. She says it is not controlling her symptoms. She continues to have tearing.  PAST MEDICAL HISTORY : Reviewed.  No changes.  CURRENT MEDICATIONS: Reviewed per [facility]  REVIEW OF SYSTEMS:  GENERAL: no change in appetite, no fatigue, no weight changes, no fever, chills or weakness RESPIRATORY: no cough, SOB, DOE,, wheezing, hemoptysis CARDIAC: no chest pain, edema or palpitations GI: no abdominal pain, diarrhea, constipation, heart burn, nausea or vomiting  PHYSICAL EXAMINATION  GENERAL: no acute distress, moderately obese  body habitus EYES: conjunctivae normal, sclerae normal, normal eye lids NECK: supple, trachea midline, no neck masses, no thyroid tenderness, no thyromegaly LYMPHATICS: no LAN in the neck, no supraclavicular LAN RESPIRATORY: breathing is even & unlabored, BS CTAB CARDIAC: RRR, no murmur,no extra heart sounds, no edema GI: abdomen soft, normal BS, no masses, no tenderness, no hepatomegaly, no splenomegaly PSYCHIATRIC: the patient is alert & oriented to person, affect & behavior appropriate  LABS/RADIOLOGY: see HPI  ASSESSMENT/PLAN:  Hyperglycemia-new  problem. Check fasting glucose level and hemoglobin A1c. Anemia of chronic disease-hemoglobin improved Allergic conjunctivitis-unstable problem. Increase patanol to twice a day. Chronic pain-uncontrolled per patient. Increase Tylenol No. 3 to 2 tablets every 4 when necessary  CPT CODE: 73220

## 2014-01-13 ENCOUNTER — Non-Acute Institutional Stay (SKILLED_NURSING_FACILITY): Payer: Medicare Other | Admitting: Internal Medicine

## 2014-01-13 DIAGNOSIS — L89609 Pressure ulcer of unspecified heel, unspecified stage: Secondary | ICD-10-CM

## 2014-01-13 DIAGNOSIS — D638 Anemia in other chronic diseases classified elsewhere: Secondary | ICD-10-CM

## 2014-01-13 DIAGNOSIS — L8994 Pressure ulcer of unspecified site, stage 4: Secondary | ICD-10-CM

## 2014-01-13 DIAGNOSIS — L8990 Pressure ulcer of unspecified site, unspecified stage: Secondary | ICD-10-CM

## 2014-01-13 NOTE — Progress Notes (Signed)
Patient ID: [name], female   DOB: [birth year], 58 y.o.   MRN: [id number] facility; [facility] Chief complaint; followup wound over her right ishium His; this is a patient that I care for her [facility]. I had her electively admitted here for treatment of her wound over her right initial tuberosity and over the bilateral Achilles areas of both heels. She had underlying osteomyelitis in the right ishium she has completed her vancomycin for. We have been using a wound VAC to the stage IV wound over her right ishium and have really made remarkable progress. The remaining wound is roughly 3/4 of an inch by half an inch. There is healthy granulation here and we continued to make progress without any obvious issues related to her underlying oseomyelitis. The areas over her Achilles tendons are both almost completely resolved.  With regards to her other medical issues he is on chronic Coumadin related to history of previous thromboembolic disease. This is being followed by our consultants pharmacist at per she appears to have a chronic normochromic normocytic anemia last checked on 2/4 at 9.9 I note that on November 14 this was 10.2. I was unable to tease out the exact diagnosis however this appears to be chronic and stable    Physical examination Skin There continues to be improvement in the condition of the wound of the right ischial tuberosity. There is healthy granulation and no evidence of infection. The wound over her bilateral Achilles area is just about resolved.  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 This continues to improve and at this point as long as there is continued improvement I see no reason to change the wound VAC. #2 normochromic normocytic anemia, the exact reason behind this is unclear although it is stable. #3 moderate protein calorie malnutrition her albumin level is 3.2 she is already on Protostat  30 cc by mouth 3 times a day

## 2014-01-19 IMAGING — XA IR FLUORO GUIDE CV LINE*R*
1 series · 1 of 1 positions shown · non-contrast
Comparison: none

CLINICAL DATA: Tetraplegia and needs IV antibiotics for infection.

[Series 1: care single · 1 of 1 slices shown]
[im 1/1]
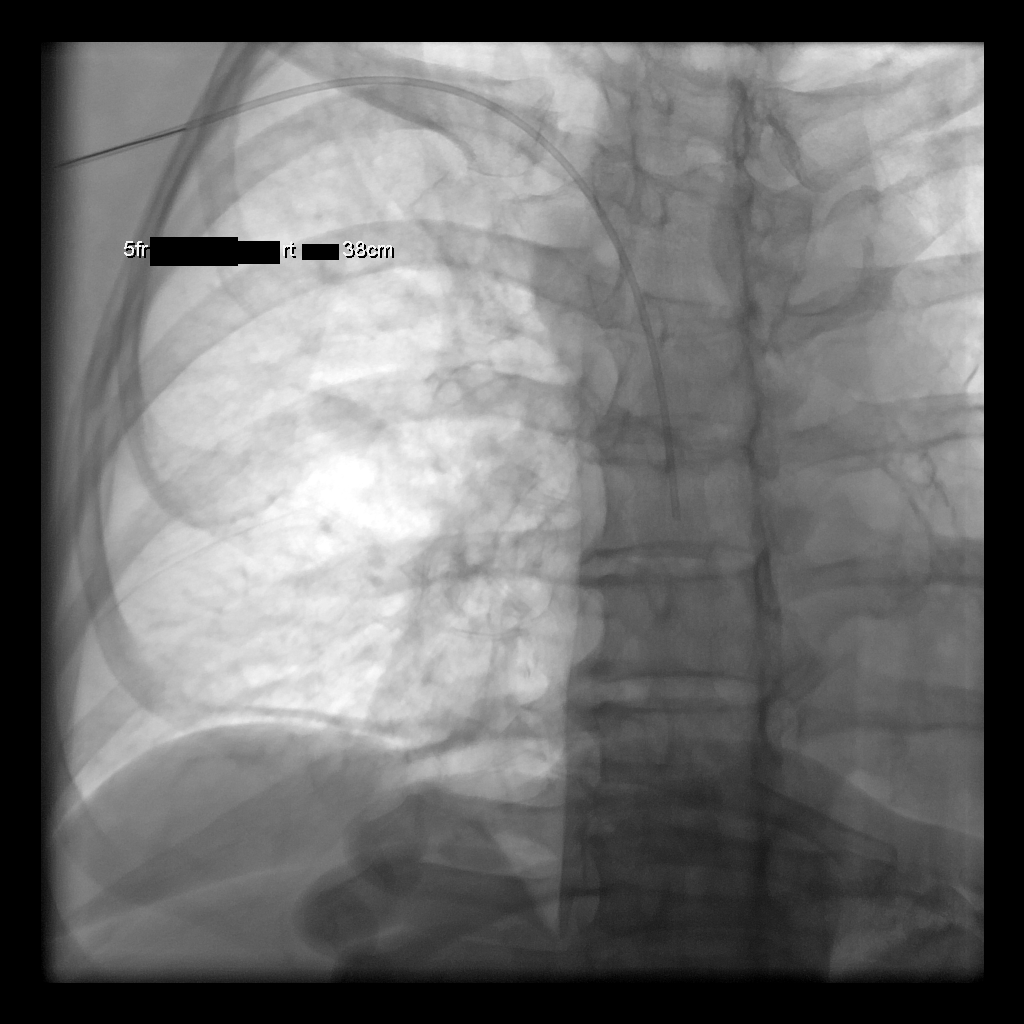

[1 of 1 positions shown; findings below may reference images not displayed]

EXAM:
PICC LINE PLACEMENT WITH ULTRASOUND AND FLUOROSCOPIC GUIDANCE

FLUOROSCOPY TIME:  18 seconds

PROCEDURE:
The patient was advised of the possible risks and complications and
agreed to undergo the procedure. The patient was then brought to the
angiographic suite for the procedure.

The right/left arm was prepped with chlorhexidine, draped in the
usual sterile fashion using maximum barrier technique (cap and mask,
sterile gown, sterile gloves, large sterile sheet, hand hygiene and
cutaneous antisepsis) and infiltrated locally with 1% Lidocaine.

Ultrasound demonstrated patency of the right basilic vein, and this
was documented with an image. Under real-time ultrasound guidance,
this vein was accessed with a 21 gauge micropuncture needle and
image documentation was performed. A [DATE] wire was introduced in to
the vein. Over this, a 5 French single lumen power injectable PICC
was advanced to the lower SVC. The PICC line length equals 38 cm.
Fluoroscopy during the procedure and fluoro spot radiograph confirms
appropriate catheter position. The catheter was flushed and covered
with a sterile dressing.

Complications: None
IMPRESSION: Successful right arm Power PICC line placement with ultrasound and
fluoroscopic guidance. The catheter is ready for use.

## 2014-01-24 ENCOUNTER — Non-Acute Institutional Stay (SKILLED_NURSING_FACILITY): Payer: Medicare Other | Admitting: Internal Medicine

## 2014-01-24 DIAGNOSIS — L8990 Pressure ulcer of unspecified site, unspecified stage: Secondary | ICD-10-CM

## 2014-01-24 DIAGNOSIS — L8994 Pressure ulcer of unspecified site, stage 4: Secondary | ICD-10-CM

## 2014-01-24 NOTE — Progress Notes (Signed)
Patient ID: [name], female   DOB: Feb 03, 1956, 58 y.o.   MRN: [id number]  facility; [facility] Chief complaint; followup wound over her right ishium His; this is a patient that I care for her [facility]. I had her electively admitted here for treatment of her wound over her right initial tuberosity and over the bilateral Achilles areas of both heels. She had underlying osteomyelitis in the right ishium she has completed her vancomycin . We have been using a wound VAC to the stage IV wound over her right ishium and have really made remarkable progress. The remaining wound is roughly 3/4 of an inch in diameter with 0.5 cn if depth. The granulation has a surface eschar although this does not look to be ominous.  With regards to her other medical issues he is on chronic Coumadin related to history of previous thromboembolic disease. This is being followed by our consultants pharmacist at [facility] she appears to have a chronic normochromic normocytic anemia last checked on 2/4 at 9.9 I note that on November 14 this was 10.2. I was unable to tease out the exact diagnosis however this appears to be chronic and stable    Physical examination Skin There continues to be improvement in the condition of the wound of the right ischial tuberosity. The granulation is not quite as healthy looking as when I last visited, but no evidence of infection. The wound over her bilateral Achilles have resolved  Social; as a final issue there is apparently insurance issues; we may need to consider arranging this complicated discharge for next week.  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 The wound VAC has been removed 2-3 days ago however I would like to reapply this for as long as is feasible at, and as long as there is continued improvement. I have added Santyl and of the wound VAC #2 normochromic normocytic anemia, the exact reason behind this  is unclear although it is stable. #3 moderate protein calorie malnutrition her albumin level is 3.2 she is already on Protostat 30 cc by mouth 3 times a day  We'll check with the facility next week with regards to discharge issues. I have spoken to the patient about pressure-relief when she returns home. This is really the most paramount issue. He discharge will be organized if the insurance stops financing her stay in the facility. If the wound does not completely close this will be disappointing, however she has made tremendous progress and are almost 2 months stay here.

## 2014-02-05 ENCOUNTER — Encounter: Payer: Self-pay | Admitting: Family

## 2014-02-05 ENCOUNTER — Non-Acute Institutional Stay (SKILLED_NURSING_FACILITY): Payer: Medicare Other | Admitting: Family

## 2014-02-05 DIAGNOSIS — D638 Anemia in other chronic diseases classified elsewhere: Secondary | ICD-10-CM

## 2014-02-05 DIAGNOSIS — Z9889 Other specified postprocedural states: Secondary | ICD-10-CM

## 2014-02-05 DIAGNOSIS — L8990 Pressure ulcer of unspecified site, unspecified stage: Secondary | ICD-10-CM

## 2014-02-05 DIAGNOSIS — L8994 Pressure ulcer of unspecified site, stage 4: Secondary | ICD-10-CM

## 2014-02-05 DIAGNOSIS — G894 Chronic pain syndrome: Secondary | ICD-10-CM

## 2014-02-05 DIAGNOSIS — E785 Hyperlipidemia, unspecified: Secondary | ICD-10-CM
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
## 2014-02-06 ENCOUNTER — Non-Acute Institutional Stay (SKILLED_NURSING_FACILITY): Payer: Medicare Other | Admitting: Internal Medicine

## 2014-02-06 ENCOUNTER — Other Ambulatory Visit: Payer: Self-pay | Admitting: *Deleted

## 2014-02-06 DIAGNOSIS — L8990 Pressure ulcer of unspecified site, unspecified stage: Secondary | ICD-10-CM

## 2014-02-06 DIAGNOSIS — L8994 Pressure ulcer of unspecified site, stage 4: Secondary | ICD-10-CM

## 2014-02-06 MED ORDER — ACETAMINOPHEN-CODEINE #3 300-30 MG PO TABS: ORAL_TABLET | ORAL | Status: DC

## 2014-02-06 NOTE — Telephone Encounter (Signed)
[facility] 

## 2014-02-06 NOTE — Progress Notes (Signed)
Patient ID: [name], female   DOB: 25-Jun-1956, 58 y.o.   MRN: [id number]   facility; [facility] Chief complaint; followup wound over her right ishium His; this is a patient that I care for her [facility]. I had her electively admitted here for treatment of her wound over her right initial tuberosity and over the bilateral Achilles areas of both heels. She had underlying osteomyelitis in the right ishium she has completed her vancomycin . We have been using a wound VAC to the stage IV wound over her right ishium and have really made remarkable progress. The remaining wound is roughly 1.5cmx1.5cmx0.3cm. The granulation has healthy granulation.  With regards to her other medical issues he is on chronic Coumadin related to history of previous thromboembolic disease. This is being followed by our consultants pharmacist at [facility] she appears to have a chronic normochromic normocytic anemia last checked on 2/4 at 9.9 I note that on November 14 this was 10.2. I was unable to tease out the exact diagnosis however this appears to be chronic and stable    Physical examination Skin There continues to be improvement in the condition of the wound of the right ischial tuberosity. The base of the wound appears healthy. Minimal escar. There are no wounds over her heel although these areas are going to be continually at risk. There is a small amount of callous over the Right foot dorsal which I manually debrided revealing a small wound.     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 She has completed her vancomycin. I will add santyl underneath the vac.  2 wound on dorsal rt foot. Uncertain etiology. Use collagen.  #3 moderate protein calorie malnutrition her albumin level is 3.2 she is already on Protostat 30 cc by mouth 3 times a dayNo other issues

## 2014-02-11 ENCOUNTER — Encounter: Payer: Self-pay | Admitting: Family
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
## 2014-02-17 ENCOUNTER — Non-Acute Institutional Stay (SKILLED_NURSING_FACILITY): Payer: Medicare Other | Admitting: Internal Medicine

## 2014-02-17 DIAGNOSIS — L89309 Pressure ulcer of unspecified buttock, unspecified stage: Secondary | ICD-10-CM

## 2014-02-17 DIAGNOSIS — L8990 Pressure ulcer of unspecified site, unspecified stage: Secondary | ICD-10-CM

## 2014-02-17 DIAGNOSIS — L8994 Pressure ulcer of unspecified site, stage 4: Secondary | ICD-10-CM

## 2014-02-17 NOTE — Progress Notes (Signed)
Patient ID: [name], female   DOB: 1955-12-08, 58 y.o.   MRN: [id number]   facility; [facility] Chief complaint; followup wound over her right ishium His; this is a patient that I care for her [facility]. I had her electively admitted here for treatment of her wound over her right ischial tuberosity and over the bilateral Achilles areas of both heels. She had underlying osteomyelitis in the right ishium she has completed her vancomycin . We have been using a wound VAC to the stage IV wound over her right ishium and have really made remarkable progress. The remaining wound is roughly 1.5cmx1.5cmx0.3cm. The granulation has healthy granulation. The wounds over her bilateral heels have closed.  With regards to her other medical issues he is on chronic Coumadin related to history of previous thromboembolic disease. This is being followed by our consultants pharmacist at [facility] she appears to have a chronic normochromic normocytic anemia last checked on 2/4 at 9.9 I note that on November 14 this was 10.2. I was unable to tease out the exact diagnosis however this appears to be chronic and stable    Physical examination Skin; the area over her right ischial tuberosity measures 1 x 1.5 x 1 cm. Using a curet I do like surface debridement of this wound the base of it seems to clean up nicely. I have not seen any recent improvement with the wound VAC therefore all discontinued as and move to a silver collagen foamed change every second day  Social; I note from talking to the staff and the patient that her insurance will end on 3/28. I have some concerns about this however the insurance company has been quite patient with [place]. I am hopeful that continued pulse lavage and the silver collagen dressings also result in some further improvement. I simply could not justify the wound VAC any further    Impression/plan #1 Stage IV pressure wound to the right ischial tuberosity with underlying  osteomyelitis. She has completed her vancomycin. I will add collagen underneath the vac.

## 2014-02-20 ENCOUNTER — Ambulatory Visit (INDEPENDENT_AMBULATORY_CARE_PROVIDER_SITE_OTHER): Payer: Medicare Other | Admitting: General Practice

## 2014-02-25 ENCOUNTER — Ambulatory Visit: Payer: Medicare Other | Admitting: Internal Medicine

## 2014-03-02 ENCOUNTER — Other Ambulatory Visit: Payer: Self-pay | Admitting: Internal Medicine

## 2014-03-02 ENCOUNTER — Ambulatory Visit: Payer: Medicare Other | Admitting: General Practice

## 2014-03-02 DIAGNOSIS — Z5181 Encounter for therapeutic drug level monitoring: Secondary | ICD-10-CM | POA: Insufficient documentation

## 2014-03-02 DIAGNOSIS — I82409 Acute embolism and thrombosis of unspecified deep veins of unspecified lower extremity: Secondary | ICD-10-CM

## 2014-03-02 LAB — POCT INR: INR: 3

## 2014-03-02 NOTE — Progress Notes (Signed)
Pre visit review using our clinic review tool, if applicable. No additional management support is needed unless otherwise documented below in the visit note. 

## 2014-03-03 ENCOUNTER — Other Ambulatory Visit: Payer: Self-pay | Admitting: Internal Medicine

## 2014-03-03 ENCOUNTER — Telehealth: Payer: Self-pay | Admitting: *Deleted

## 2014-03-03 DIAGNOSIS — N319 Neuromuscular dysfunction of bladder, unspecified: Secondary | ICD-10-CM

## 2014-03-03 DIAGNOSIS — L89509 Pressure ulcer of unspecified ankle, unspecified stage: Secondary | ICD-10-CM

## 2014-03-03 DIAGNOSIS — G8250 Quadriplegia, unspecified: Secondary | ICD-10-CM

## 2014-03-03 NOTE — Telephone Encounter (Signed)
Please contact pt - ask her what [facility] agency she has used -or would like to use - and we will change (if we are able) OV appropriate if necessary to review details/concerns thanks

## 2014-03-03 NOTE — Telephone Encounter (Signed)
[name] from [facility] called states after her initial visit she found safety concerns.  Pt lives alone 24/7.  She does have a care giver that puts her in the bed in the evening out takes her out in the morning, there is no one there with pt at night.  States daughter was there during visit there is no plan in place for anyone to be there with pt at night.  Pt is not appropriate for HH due to safety concerns.  Pt has a wound on her buttocks that needs to addressed.  Please advise

## 2014-03-03 NOTE — Telephone Encounter (Signed)
Called [facility] to give authorization for diazepam. Colletta [place] states pt brought in a new script from Kristopher Glee, PA.../lmb

## 2014-03-03 NOTE — Telephone Encounter (Signed)
Spoke with pt she states she used [facility] before but would prefer [facility].

## 2014-03-04 NOTE — Telephone Encounter (Signed)
Will make referral to [facility] as requested

## 2014-03-09 ENCOUNTER — Ambulatory Visit (INDEPENDENT_AMBULATORY_CARE_PROVIDER_SITE_OTHER): Payer: Medicare Other | Admitting: General Practice

## 2014-03-09 DIAGNOSIS — Z5181 Encounter for therapeutic drug level monitoring: Secondary | ICD-10-CM

## 2014-03-09 DIAGNOSIS — I82409 Acute embolism and thrombosis of unspecified deep veins of unspecified lower extremity: Secondary | ICD-10-CM

## 2014-03-09 LAB — POCT INR: INR: 3.3

## 2014-03-09 NOTE — Progress Notes (Signed)
Pre visit review using our clinic review tool, if applicable. No additional management support is needed unless otherwise documented below in the visit note. 

## 2014-03-12 ENCOUNTER — Other Ambulatory Visit: Payer: Self-pay | Admitting: Internal Medicine

## 2014-03-12 ENCOUNTER — Other Ambulatory Visit (INDEPENDENT_AMBULATORY_CARE_PROVIDER_SITE_OTHER): Payer: Medicare Other

## 2014-03-12 ENCOUNTER — Telehealth: Payer: Self-pay | Admitting: Internal Medicine

## 2014-03-12 DIAGNOSIS — N390 Urinary tract infection, site not specified: Secondary | ICD-10-CM

## 2014-03-12 LAB — URINALYSIS, ROUTINE W REFLEX MICROSCOPIC
Bilirubin Urine: NEGATIVE
KETONES UR: NEGATIVE
Nitrite: NEGATIVE
Specific Gravity, Urine: <=1.005 — AB (ref 1.000–1.030)
TOTAL PROTEIN, URINE-UPE24: 100 — AB
Urine Glucose: NEGATIVE
Urobilinogen, UA: 0.2 (ref 0.0–1.0)

## 2014-03-12 MED ORDER — AMPICILLIN 500 MG PO CAPS: 500.0000 mg | ORAL_CAPSULE | Freq: Three times a day (TID) | ORAL | Status: DC

## 2014-03-12 NOTE — Telephone Encounter (Signed)
ok 

## 2014-03-12 NOTE — Telephone Encounter (Signed)
Patient states that she is certain that she has a UTI and is unable to make it to the office but her daughter is coming here this morning for an appointment and she is wanting to know if her daughter can drop off her urine specimen to the lab when she comes this way so that she can get an antibiotic. Please advise.

## 2014-03-12 NOTE — Telephone Encounter (Signed)
Notified Lanette orders are in...[name]

## 2014-03-15 LAB — URINE CULTURE

## 2014-03-16 ENCOUNTER — Other Ambulatory Visit: Payer: Self-pay | Admitting: Internal Medicine

## 2014-03-16 LAB — PROTIME-INR

## 2014-03-16 MED ORDER — LEVOFLOXACIN 500 MG PO TABS: 500.0000 mg | ORAL_TABLET | Freq: Every day | ORAL | Status: DC

## 2014-03-17 ENCOUNTER — Ambulatory Visit (INDEPENDENT_AMBULATORY_CARE_PROVIDER_SITE_OTHER): Payer: Medicare Other | Admitting: General Practice

## 2014-03-17 DIAGNOSIS — Z5181 Encounter for therapeutic drug level monitoring: Secondary | ICD-10-CM

## 2014-03-17 DIAGNOSIS — I82409 Acute embolism and thrombosis of unspecified deep veins of unspecified lower extremity: Secondary | ICD-10-CM

## 2014-03-17 LAB — POCT INR: INR: 1.9

## 2014-03-17 NOTE — Progress Notes (Signed)
Pre visit review using our clinic review tool, if applicable. No additional management support is needed unless otherwise documented below in the visit note. 

## 2014-03-18 ENCOUNTER — Encounter: Payer: Self-pay | Admitting: Internal Medicine

## 2014-03-18 ENCOUNTER — Ambulatory Visit (INDEPENDENT_AMBULATORY_CARE_PROVIDER_SITE_OTHER): Payer: Medicare Other | Admitting: Internal Medicine

## 2014-03-18 VITALS — BP 124/68 | HR 49 | Temp 97.2°F

## 2014-03-18 DIAGNOSIS — G8250 Quadriplegia, unspecified: Secondary | ICD-10-CM

## 2014-03-18 DIAGNOSIS — L89154 Pressure ulcer of sacral region, stage 4: Secondary | ICD-10-CM

## 2014-03-18 DIAGNOSIS — E785 Hyperlipidemia, unspecified: Secondary | ICD-10-CM

## 2014-03-18 DIAGNOSIS — L8994 Pressure ulcer of unspecified site, stage 4: Secondary | ICD-10-CM

## 2014-03-18 DIAGNOSIS — N390 Urinary tract infection, site not specified: Secondary | ICD-10-CM

## 2014-03-18 DIAGNOSIS — L89109 Pressure ulcer of unspecified part of back, unspecified stage: Secondary | ICD-10-CM

## 2014-03-18 MED ORDER — PRAVASTATIN SODIUM 40 MG PO TABS: 40.0000 mg | ORAL_TABLET | Freq: Every day | ORAL | Status: DC

## 2014-03-18 MED ORDER — OLOPATADINE HCL 0.1 % OP SOLN: 1.0000 [drp] | Freq: Two times a day (BID) | OPHTHALMIC | Status: DC | PRN

## 2014-03-18 MED ORDER — ACETAMINOPHEN-CODEINE #3 300-30 MG PO TABS: 1.0000 | ORAL_TABLET | ORAL | Status: DC | PRN
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
## 2014-03-18 NOTE — Assessment & Plan Note (Signed)
On statin therapy for same. Clarified prava, not atorva at home (atorva at [facility]) dose increase November 2012 because of uncontrolled lipids Labs reviewed, check annually or as needed

## 2014-03-18 NOTE — Progress Notes (Signed)
Pre visit review using our clinic review tool, if applicable. No additional management support is needed unless otherwise documented below in the visit note. 
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
## 2014-03-18 NOTE — Patient Instructions (Signed)
It was good to see you today.  We have reviewed your prior records including labs and tests today  Will refer to new home agency for "low air loss overlay mattress" and home aide as requested  Medications reviewed and updated, no changes recommended at this time. Start Levaquin daily x 10 days for your UTI - at your pharmacy!  Please schedule followup in 6 months, call sooner if problems.

## 2014-03-19 DIAGNOSIS — N390 Urinary tract infection, site not specified: Secondary | ICD-10-CM | POA: Insufficient documentation

## 2014-03-19 NOTE — Assessment & Plan Note (Signed)
Hospitalization IV antibiotic and wound care therapy for same late 2014 reviewed Skilled nursing facility for wound care early 2013 reviewed, now ongoing home care by family Reviewed issues with home health refusing services due to concerns of risk (see tetraplegia discussion above) -patient asks [place] investigate potential Care Bridge option through hospice of [place] current size by pt report:  .5" x.25" inch with superficial depth -not examined by me today

## 2014-03-19 NOTE — Assessment & Plan Note (Signed)
Issue chronic cauterization with indwelling suprapubic catheter, but patient with recent increase in spasm symptoms consistent with acute infection. Reviewed culture and sensitivities. Patient has not yet begun for quinolone as prescribed earlier this week. Follow up with pharmacy regarding same. Patient will call if continued symptoms or other problems. Anticoagulation risk to be monitored by Coumadin clinic as ongoing, feel risk outweighed by benefit of antibiotic therapy for acute symptoms

## 2014-03-20 ENCOUNTER — Ambulatory Visit (INDEPENDENT_AMBULATORY_CARE_PROVIDER_SITE_OTHER): Payer: No Typology Code available for payment source | Admitting: General Practice

## 2014-03-20 ENCOUNTER — Telehealth: Payer: Self-pay | Admitting: *Deleted

## 2014-03-20 DIAGNOSIS — Z5181 Encounter for therapeutic drug level monitoring: Secondary | ICD-10-CM

## 2014-03-20 DIAGNOSIS — I82409 Acute embolism and thrombosis of unspecified deep veins of unspecified lower extremity: Secondary | ICD-10-CM

## 2014-03-20 LAB — POCT INR: INR: 2.3

## 2014-03-20 NOTE — Telephone Encounter (Signed)
Noted - thanks for investigating Please let pt know same No new order/changes recommended

## 2014-03-20 NOTE — Telephone Encounter (Signed)
Notified pt with response.../lmb 

## 2014-03-20 NOTE — Telephone Encounter (Signed)
Called to try to get pt set-up for home health with care [facility] spoke with Juliann Pulse. She states they have already explain to pt that they are unable to care for her needs. Will not take own for home health...[name]

## 2014-03-20 NOTE — Progress Notes (Signed)
Pre visit review using our clinic review tool, if applicable. No additional management support is needed unless otherwise documented below in the visit note. 

## 2014-03-23 ENCOUNTER — Ambulatory Visit: Payer: Medicare Other | Admitting: General Practice

## 2014-03-23 DIAGNOSIS — I82409 Acute embolism and thrombosis of unspecified deep veins of unspecified lower extremity: Secondary | ICD-10-CM

## 2014-03-23 DIAGNOSIS — Z5181 Encounter for therapeutic drug level monitoring: Secondary | ICD-10-CM

## 2014-03-23 LAB — POCT INR: INR: 3.9

## 2014-03-23 NOTE — Progress Notes (Signed)
Pre visit review using our clinic review tool, if applicable. No additional management support is needed unless otherwise documented below in the visit note. 

## 2014-03-24 LAB — PROTIME-INR: INR: 3.9 — AB (ref 0.9–1.1)

## 2014-03-25 ENCOUNTER — Telehealth: Payer: Self-pay | Admitting: Internal Medicine

## 2014-03-25 DIAGNOSIS — E876 Hypokalemia: Secondary | ICD-10-CM

## 2014-03-25 DIAGNOSIS — L89151 Pressure ulcer of sacral region, stage 1: Secondary | ICD-10-CM

## 2014-03-25 DIAGNOSIS — N319 Neuromuscular dysfunction of bladder, unspecified: Secondary | ICD-10-CM

## 2014-03-25 DIAGNOSIS — G8250 Quadriplegia, unspecified: Secondary | ICD-10-CM

## 2014-03-25 NOTE — Telephone Encounter (Signed)
I think ok to forward to Dr Asa Lente

## 2014-03-25 NOTE — Telephone Encounter (Signed)
Patient Information:  Caller Name: [name]  Phone: [phone]  Patient: [name], [name]  Gender: Female  DOB: 1956-07-03  Age: 58 Years  PCP: [name] (Adults only)  Office Follow Up:  Does the office need to follow up with this patient?: Yes  Instructions For The Office: See RN Note.  RN Note:  Patient confirms that weak spells have been ongoing, have not changed or worsened in any way, and her main reason for call was to check on her last potassium results. Given no new concern and only wanting lab results, patient declines scheduling appointment and requests that someone look into most recent potassium level to confirm how much K-Dur she should be taking, and that someone give her an update on the status of setting up a home health nurse for her. Patient aware that office will be closing and someone will follow up with her once office reopens. PLEASE ADVISE AND FOLLOW UP WITH PATIENT REGARDING HER QUESTIONS. THANK YOU.  Symptoms  Reason For Call & Symptoms: "Weak Spells" ; patient questions if it could be her potassium. Patient reports she has always had weak spells and denies worsening of "weak spells" but she believes there is an inconsistency in the K-Dur she was advised to take and wants to know her potassium level  Reviewed Health History In EMR: Yes  Reviewed Medications In EMR: Yes  Reviewed Allergies In EMR: Yes  Reviewed Surgeries / Procedures: Yes  Date of Onset of Symptoms: Unknown  Guideline(s) Used:  Weakness (Generalized) and Fatigue  Disposition Per Guideline:   See Today in Office  Reason For Disposition Reached:   Taking a medicine that could cause weakness (e.g., blood pressure medications, diuretics)  Advice Given:  Call Back If:  Passes out  Breathing difficulty occurs  You become worse.  Patient Will Follow Care Advice:  YES

## 2014-03-26 NOTE — Telephone Encounter (Signed)
I do not have any potassium lab done since 10/03/14 Pt would need HH to draw lab or have drawn here (Bmet for 276.8) Also please clarify how much potassium pt is taking-- if she is taking any--- because i do not have potassium on med list!

## 2014-03-27 NOTE — Telephone Encounter (Signed)
Called pt back gave her md response. Pt states she been taking K-Dur 20 meq not sure who rx medication to her. Pt states she doesn't have any home health agency coming to her house. She inquired about care bridge again once again gave her msg from 03/20/14 per Juliann Pulse not able to handle her needs. Pt is wanting to get set-up with any home agency...[name]

## 2014-03-27 NOTE — Telephone Encounter (Signed)
HH ordered - [facility] will work on same Pt should stop taking potassium until we get the level checked - hopefully we can get [facility] to do this home lab draw soon! If not able to arrange lab check [place] by end of next week, pt will need to arrange a OV for lab only (bmet - 276.8)

## 2014-03-27 NOTE — Telephone Encounter (Signed)
Notified pt with md response.  Entered B-met...[name]

## 2014-03-30 ENCOUNTER — Other Ambulatory Visit: Payer: Self-pay | Admitting: Internal Medicine

## 2014-03-30 LAB — POCT INR: INR: 2.6

## 2014-03-31 ENCOUNTER — Telehealth: Payer: Self-pay | Admitting: *Deleted

## 2014-03-31 ENCOUNTER — Ambulatory Visit (INDEPENDENT_AMBULATORY_CARE_PROVIDER_SITE_OTHER): Payer: No Typology Code available for payment source | Admitting: General Practice

## 2014-03-31 ENCOUNTER — Other Ambulatory Visit: Payer: Self-pay | Admitting: *Deleted

## 2014-03-31 DIAGNOSIS — I82409 Acute embolism and thrombosis of unspecified deep veins of unspecified lower extremity: Secondary | ICD-10-CM

## 2014-03-31 DIAGNOSIS — Z5181 Encounter for therapeutic drug level monitoring: Secondary | ICD-10-CM

## 2014-03-31 MED ORDER — DIAZEPAM 5 MG PO TABS: 5.0000 mg | ORAL_TABLET | Freq: Two times a day (BID) | ORAL | Status: DC | PRN

## 2014-03-31 NOTE — Telephone Encounter (Signed)
Called refill into pharmacy spoke with Clair Gulling gave md approval.../lmb

## 2014-03-31 NOTE — Progress Notes (Signed)
Pre visit review using our clinic review tool, if applicable. No additional management support is needed unless otherwise documented below in the visit note. 

## 2014-03-31 NOTE — Telephone Encounter (Signed)
[place] for bmet

## 2014-03-31 NOTE — Telephone Encounter (Signed)
Pt called states [facility] needs an order to come out to her home to draw her blood for Met B that is ordered.  She states she is unable to come to [facility] to have it done.  Please advise in Dr Katheren Puller absence

## 2014-04-02 ENCOUNTER — Telehealth: Payer: Self-pay | Admitting: *Deleted

## 2014-04-02 NOTE — Telephone Encounter (Signed)
Called the patient back and informed will have MD fax order to [facility] for Monday when they come out to see this patient.  See phone note from 03/31/14.

## 2014-04-02 NOTE — Telephone Encounter (Signed)
Done hardcopy to robin  

## 2014-04-02 NOTE — Telephone Encounter (Signed)
Pt called again requesting orders for lab draw to be sent to [facility].  [name] this is as per our conversation.

## 2014-04-02 NOTE — Telephone Encounter (Signed)
Called [facility] and they need this written on rx and faxed to [facility] at [phone] ATTN: Judithann Sauger.  They are going out to see the patient on Monday 04/06/14.

## 2014-04-03 ENCOUNTER — Encounter: Payer: Self-pay | Admitting: Internal Medicine

## 2014-04-03 NOTE — Telephone Encounter (Signed)
Faxed order to [facility].  

## 2014-04-06 ENCOUNTER — Telehealth: Payer: Self-pay | Admitting: *Deleted

## 2014-04-06 ENCOUNTER — Ambulatory Visit (INDEPENDENT_AMBULATORY_CARE_PROVIDER_SITE_OTHER): Payer: No Typology Code available for payment source | Admitting: General Practice

## 2014-04-06 DIAGNOSIS — Z5181 Encounter for therapeutic drug level monitoring: Secondary | ICD-10-CM

## 2014-04-06 DIAGNOSIS — I82409 Acute embolism and thrombosis of unspecified deep veins of unspecified lower extremity: Secondary | ICD-10-CM

## 2014-04-06 LAB — POCT INR: INR: 4

## 2014-04-06 NOTE — Progress Notes (Signed)
Pre visit review using our clinic review tool, if applicable. No additional management support is needed unless otherwise documented below in the visit note. 

## 2014-04-06 NOTE — Telephone Encounter (Signed)
[name] called to report pts INR as 4.0.  Please advise

## 2014-04-07 NOTE — Telephone Encounter (Signed)
Calls re; INR and coumadin mgmt should go directly to [place] as well as the pcp i will forward to cindy now for mgmt of this issue thanks

## 2014-04-09 ENCOUNTER — Encounter: Payer: Self-pay | Admitting: Internal Medicine

## 2014-04-13 ENCOUNTER — Ambulatory Visit (INDEPENDENT_AMBULATORY_CARE_PROVIDER_SITE_OTHER): Payer: No Typology Code available for payment source | Admitting: Family Medicine

## 2014-04-13 DIAGNOSIS — Z5181 Encounter for therapeutic drug level monitoring: Secondary | ICD-10-CM

## 2014-04-13 DIAGNOSIS — I82409 Acute embolism and thrombosis of unspecified deep veins of unspecified lower extremity: Secondary | ICD-10-CM

## 2014-04-13 LAB — POCT INR: INR: 2.6

## 2014-04-15 ENCOUNTER — Telehealth: Payer: Self-pay

## 2014-04-15 MED ORDER — POTASSIUM CHLORIDE ER 10 MEQ PO TBCR: 10.0000 meq | EXTENDED_RELEASE_TABLET | Freq: Every day | ORAL | Status: DC

## 2014-04-15 NOTE — Telephone Encounter (Signed)
Patient called stating that she has not received potassium results from 04/06/14. She states that this report was faxed over to our office twice last week with no follow up. Patient would like to know results and stats that if results are needed again to contact  [facility] nurse line at [phone]. Thanks

## 2014-04-15 NOTE — Telephone Encounter (Signed)
Called and obtained results via fax, will forward to Dr. Jenny Reichmann for review.

## 2014-04-15 NOTE — Telephone Encounter (Signed)
Report reviewed with mild low K, likely related to bumex use  OK for klorcon 10 qd - done erx

## 2014-04-16 NOTE — Telephone Encounter (Signed)
Patient informed of results.  The patient stated she has been on klorcon 20 meq for years and not sure why it is not on her list.

## 2014-04-17 MED ORDER — POTASSIUM CHLORIDE CRYS ER 20 MEQ PO TBCR: 20.0000 meq | EXTENDED_RELEASE_TABLET | Freq: Every day | ORAL | Status: DC

## 2014-04-17 NOTE — Addendum Note (Signed)
Addended by: Biagio Borg on: 04/17/2014 01:29 PM   Modules accepted: Orders, Medications

## 2014-04-17 NOTE — Telephone Encounter (Signed)
Potassium changed to 20 qd

## 2014-04-17 NOTE — Telephone Encounter (Signed)
Called the patient informed. 

## 2014-04-20 ENCOUNTER — Ambulatory Visit (INDEPENDENT_AMBULATORY_CARE_PROVIDER_SITE_OTHER): Payer: Medicare Other | Admitting: General Practice

## 2014-04-20 DIAGNOSIS — Z5181 Encounter for therapeutic drug level monitoring: Secondary | ICD-10-CM

## 2014-04-20 DIAGNOSIS — I82409 Acute embolism and thrombosis of unspecified deep veins of unspecified lower extremity: Secondary | ICD-10-CM

## 2014-04-20 LAB — POCT INR: INR: 3.3

## 2014-04-20 NOTE — Progress Notes (Signed)
Pre visit review using our clinic review tool, if applicable. No additional management support is needed unless otherwise documented below in the visit note. 

## 2014-04-23 ENCOUNTER — Encounter: Payer: Self-pay | Admitting: Internal Medicine

## 2014-04-24 DIAGNOSIS — G894 Chronic pain syndrome: Secondary | ICD-10-CM

## 2014-04-24 DIAGNOSIS — N319 Neuromuscular dysfunction of bladder, unspecified: Secondary | ICD-10-CM

## 2014-04-24 DIAGNOSIS — L89109 Pressure ulcer of unspecified part of back, unspecified stage: Secondary | ICD-10-CM

## 2014-04-24 DIAGNOSIS — G8250 Quadriplegia, unspecified: Secondary | ICD-10-CM

## 2014-04-27 LAB — POCT INR: INR: 3.3

## 2014-04-29 ENCOUNTER — Ambulatory Visit (INDEPENDENT_AMBULATORY_CARE_PROVIDER_SITE_OTHER): Payer: Medicare Other | Admitting: Family Medicine

## 2014-04-29 DIAGNOSIS — Z5181 Encounter for therapeutic drug level monitoring: Secondary | ICD-10-CM

## 2014-04-29 DIAGNOSIS — I82409 Acute embolism and thrombosis of unspecified deep veins of unspecified lower extremity: Secondary | ICD-10-CM

## 2014-05-01 ENCOUNTER — Other Ambulatory Visit: Payer: Self-pay | Admitting: Internal Medicine

## 2014-05-04 ENCOUNTER — Ambulatory Visit (INDEPENDENT_AMBULATORY_CARE_PROVIDER_SITE_OTHER): Payer: Medicare Other | Admitting: General Practice

## 2014-05-04 DIAGNOSIS — I82409 Acute embolism and thrombosis of unspecified deep veins of unspecified lower extremity: Secondary | ICD-10-CM

## 2014-05-04 DIAGNOSIS — Z5181 Encounter for therapeutic drug level monitoring: Secondary | ICD-10-CM

## 2014-05-04 LAB — POCT INR: INR: 2.5

## 2014-05-04 NOTE — Progress Notes (Signed)
Pre visit review using our clinic review tool, if applicable. No additional management support is needed unless otherwise documented below in the visit note. 

## 2014-05-11 ENCOUNTER — Ambulatory Visit (INDEPENDENT_AMBULATORY_CARE_PROVIDER_SITE_OTHER): Payer: Medicare Other | Admitting: General Practice

## 2014-05-11 DIAGNOSIS — I82409 Acute embolism and thrombosis of unspecified deep veins of unspecified lower extremity: Secondary | ICD-10-CM

## 2014-05-11 DIAGNOSIS — Z5181 Encounter for therapeutic drug level monitoring: Secondary | ICD-10-CM

## 2014-05-11 LAB — POCT INR: INR: 3

## 2014-05-11 NOTE — Progress Notes (Signed)
Pre visit review using our clinic review tool, if applicable. No additional management support is needed unless otherwise documented below in the visit note. 

## 2014-05-18 ENCOUNTER — Ambulatory Visit (INDEPENDENT_AMBULATORY_CARE_PROVIDER_SITE_OTHER): Payer: Medicare Other | Admitting: General Practice

## 2014-05-18 DIAGNOSIS — I82409 Acute embolism and thrombosis of unspecified deep veins of unspecified lower extremity: Secondary | ICD-10-CM

## 2014-05-18 DIAGNOSIS — Z5181 Encounter for therapeutic drug level monitoring: Secondary | ICD-10-CM

## 2014-05-18 LAB — POCT INR: INR: 3.3

## 2014-05-18 LAB — PROTIME-INR: INR: 3.3 — AB (ref 0.9–1.1)

## 2014-05-18 NOTE — Progress Notes (Signed)
Pre visit review using our clinic review tool, if applicable. No additional management support is needed unless otherwise documented below in the visit note. 

## 2014-05-21 ENCOUNTER — Telehealth: Payer: Self-pay | Admitting: Internal Medicine

## 2014-05-21 NOTE — Telephone Encounter (Signed)
[facility] left message about [name].  States she was seen on Monday, 05/18/14.  Patient has open pressure ulcers on her heels, 4cm x 3cm, not much depth.  Can they use [facility]?  She has a private aid that will be helping with this care.  In addition, she needs a re-certification for [facility].

## 2014-05-26 ENCOUNTER — Ambulatory Visit (INDEPENDENT_AMBULATORY_CARE_PROVIDER_SITE_OTHER): Payer: Medicare Other | Admitting: Family Medicine

## 2014-05-26 ENCOUNTER — Telehealth: Payer: Self-pay | Admitting: *Deleted

## 2014-05-26 DIAGNOSIS — I82409 Acute embolism and thrombosis of unspecified deep veins of unspecified lower extremity: Secondary | ICD-10-CM

## 2014-05-26 DIAGNOSIS — Z5181 Encounter for therapeutic drug level monitoring: Secondary | ICD-10-CM

## 2014-05-26 LAB — POCT INR: INR: 2.3

## 2014-05-26 NOTE — Telephone Encounter (Signed)
Left msg on triage stating pt has 2 wounds on her heels and they are open. Need order to know what to do. Pt states she has appt with the wound clinic. MD is put of office  pls advise...[name]

## 2014-05-26 NOTE — Telephone Encounter (Signed)
Do we know when the wound clinic appt is?  Does she have home health?

## 2014-05-26 NOTE — Telephone Encounter (Signed)
Per Inez Catalina, RN w/ [facility] pt was going to call and get an appt with the wound clinic. She will go back out to see pt in the morning. Pt uses [facility] for home health services...[name]

## 2014-05-27 NOTE — Telephone Encounter (Signed)
Notified pt with md response.../lmb 

## 2014-05-27 NOTE — Telephone Encounter (Signed)
Ok for wet to dry dressing changes daily until wound clinic

## 2014-06-01 ENCOUNTER — Ambulatory Visit: Payer: No Typology Code available for payment source | Admitting: General Practice

## 2014-06-01 ENCOUNTER — Other Ambulatory Visit: Payer: Self-pay | Admitting: Internal Medicine

## 2014-06-01 ENCOUNTER — Encounter: Payer: Self-pay | Admitting: Infectious Disease

## 2014-06-01 DIAGNOSIS — Z5181 Encounter for therapeutic drug level monitoring: Secondary | ICD-10-CM

## 2014-06-01 LAB — POCT INR: INR: 1.9

## 2014-06-01 NOTE — Progress Notes (Signed)
Pre visit review using our clinic review tool, if applicable. No additional management support is needed unless otherwise documented below in the visit note. 

## 2014-06-08 ENCOUNTER — Encounter (HOSPITAL_BASED_OUTPATIENT_CLINIC_OR_DEPARTMENT_OTHER): Payer: Medicare Other | Attending: Plastic Surgery

## 2014-06-08 ENCOUNTER — Ambulatory Visit: Payer: No Typology Code available for payment source | Admitting: General Practice

## 2014-06-08 DIAGNOSIS — L8994 Pressure ulcer of unspecified site, stage 4: Secondary | ICD-10-CM | POA: Insufficient documentation

## 2014-06-08 DIAGNOSIS — Z86718 Personal history of other venous thrombosis and embolism: Secondary | ICD-10-CM | POA: Diagnosis not present

## 2014-06-08 DIAGNOSIS — L8993 Pressure ulcer of unspecified site, stage 3: Secondary | ICD-10-CM | POA: Insufficient documentation

## 2014-06-08 DIAGNOSIS — E785 Hyperlipidemia, unspecified: Secondary | ICD-10-CM | POA: Diagnosis not present

## 2014-06-08 DIAGNOSIS — Z981 Arthrodesis status: Secondary | ICD-10-CM | POA: Diagnosis not present

## 2014-06-08 DIAGNOSIS — L89899 Pressure ulcer of other site, unspecified stage: Secondary | ICD-10-CM | POA: Diagnosis not present

## 2014-06-08 DIAGNOSIS — L89309 Pressure ulcer of unspecified buttock, unspecified stage: Secondary | ICD-10-CM | POA: Diagnosis present

## 2014-06-08 DIAGNOSIS — N319 Neuromuscular dysfunction of bladder, unspecified: Secondary | ICD-10-CM | POA: Insufficient documentation

## 2014-06-08 DIAGNOSIS — G8250 Quadriplegia, unspecified: Secondary | ICD-10-CM | POA: Insufficient documentation

## 2014-06-08 DIAGNOSIS — Z5181 Encounter for therapeutic drug level monitoring: Secondary | ICD-10-CM

## 2014-06-08 LAB — POCT INR: INR: 2.8

## 2014-06-08 NOTE — Progress Notes (Signed)
Pre visit review using our clinic review tool, if applicable. No additional management support is needed unless otherwise documented below in the visit note. 

## 2014-06-09 NOTE — Consult Note (Signed)
[name], [name]               ACCOUNT NO.:  [id number]  MEDICAL RECORD NO.:  [id number]  LOCATION:  FOOT                         FACILITY:  [place]  PHYSICIAN:  Irene Limbo, MD   DATE OF BIRTH:  27-Apr-1956  DATE OF CONSULTATION:  06/08/2014 DATE OF DISCHARGE:                                CONSULTATION   CHIEF COMPLAINT:  Bilateral heel and right ischial pressure ulcerations.  HISTORY OF PRESENT ILLNESS:  The patient is a 58 year old quadriplegic female that returns to clinic for increased size of her chronic wounds. She is a previous patient of Dr. Dellia Nims and was admitted to nursing facility, where she underwent treatment for osteomyelitis with IV antibiotics and per review of the chart made significant improvement of her bilateral heel wounds.  She has also had previous wound VAC treatment to her ischium.  She has recently returned home.  She states that she has an alternating pressure mattress, but feels that it is quite old and may need replacement.  She states that she has home health for a replacement of her Foley catheter, but there has been no wound care provided.  She reports increased drainage over her heel wounds.  PAST MEDICAL HISTORY:  Includes quadriplegia, DVT of her left lower extremity, history of tibia and fibula fractures in 2011, chronic suprapubic catheter, hyperlipidemia, neurogenic bladder.  PAST SURGICAL HISTORY:  Includes posterior cervical fusion, strabismus surgery, dilation and curettage of uterus, debridement of her chronic wounds.  SOCIAL HISTORY:  The patient quit smoking in 1978.  PHYSICAL EXAMINATION:  Vital Signs:  Blood pressure is 124/94, pulse is 79, temperature is 97.5, height is 5 feet 4 inches, weight was not obtained.  Left ischial ulcer is a stage IV ulceration measured as 1.2 x 1.2 x 2.3 cm.  The base of the wound is hard in nature.  It is not visualized.  The left heel ulcer and right heel ulcer of both stage III Ulcerations.  Over the left, wound size is measured as 2.8 x 3 x 0.4 cm. Over the right heel, wound size is 2.6 x 4 x 0.4 cm.  The patient is anesthetic in the area.  Scissors were used to remove all the superficial slough present over the base of the wound over bilateral heel ulcers for a selective debridement.  ASSESSMENT:  We will obtain baseline labs including a prealbumin.  We will plan for silver alginate to all 3 wounds at this time with foam protective dressing.  The patient also has foam boots present with her today.  We discussed that presently, although she has depth to her ischial wound. The skin opening is quite small and I do not think we will be able to place a wound VAC.  There is minimal drainage from this wound.  PLAN:  We will plan for a followup in 2 weeks' time.          ______________________________ Irene Limbo, MD     BT/MEDQ  D:  06/08/2014  T:  06/09/2014  Job:  [id number]

## 2014-06-16 ENCOUNTER — Ambulatory Visit (INDEPENDENT_AMBULATORY_CARE_PROVIDER_SITE_OTHER): Payer: Medicare Other | Admitting: Family Medicine

## 2014-06-16 DIAGNOSIS — Z5181 Encounter for therapeutic drug level monitoring: Secondary | ICD-10-CM

## 2014-06-16 LAB — POCT INR: INR: 2.4

## 2014-06-22 DIAGNOSIS — L8994 Pressure ulcer of unspecified site, stage 4: Secondary | ICD-10-CM | POA: Diagnosis not present

## 2014-06-22 DIAGNOSIS — L89309 Pressure ulcer of unspecified buttock, unspecified stage: Secondary | ICD-10-CM | POA: Diagnosis not present

## 2014-06-22 DIAGNOSIS — L8993 Pressure ulcer of unspecified site, stage 3: Secondary | ICD-10-CM | POA: Diagnosis not present

## 2014-06-22 DIAGNOSIS — L89899 Pressure ulcer of other site, unspecified stage: Secondary | ICD-10-CM | POA: Diagnosis not present

## 2014-06-22 LAB — PROTIME-INR

## 2014-06-23 ENCOUNTER — Ambulatory Visit (INDEPENDENT_AMBULATORY_CARE_PROVIDER_SITE_OTHER): Payer: Medicare Other | Admitting: General Practice

## 2014-06-23 DIAGNOSIS — Z5181 Encounter for therapeutic drug level monitoring: Secondary | ICD-10-CM

## 2014-06-23 LAB — POCT INR: INR: 2.6

## 2014-06-23 NOTE — Progress Notes (Signed)
Pre visit review using our clinic review tool, if applicable. No additional management support is needed unless otherwise documented below in the visit note. 

## 2014-06-29 ENCOUNTER — Ambulatory Visit: Payer: No Typology Code available for payment source | Admitting: General Practice

## 2014-06-29 DIAGNOSIS — Z5181 Encounter for therapeutic drug level monitoring: Secondary | ICD-10-CM

## 2014-06-29 LAB — POCT INR: INR: 2.6

## 2014-06-29 NOTE — Progress Notes (Signed)
Pre visit review using our clinic review tool, if applicable. No additional management support is needed unless otherwise documented below in the visit note. 

## 2014-07-01 ENCOUNTER — Other Ambulatory Visit: Payer: Self-pay | Admitting: Internal Medicine

## 2014-07-06 ENCOUNTER — Encounter (HOSPITAL_BASED_OUTPATIENT_CLINIC_OR_DEPARTMENT_OTHER): Payer: Medicare Other | Attending: Plastic Surgery

## 2014-07-06 DIAGNOSIS — L8993 Pressure ulcer of unspecified site, stage 3: Secondary | ICD-10-CM | POA: Insufficient documentation

## 2014-07-06 DIAGNOSIS — L89309 Pressure ulcer of unspecified buttock, unspecified stage: Secondary | ICD-10-CM | POA: Insufficient documentation

## 2014-07-06 DIAGNOSIS — L89899 Pressure ulcer of other site, unspecified stage: Secondary | ICD-10-CM | POA: Insufficient documentation

## 2014-07-06 DIAGNOSIS — L8992 Pressure ulcer of unspecified site, stage 2: Secondary | ICD-10-CM | POA: Insufficient documentation

## 2014-07-06 DIAGNOSIS — L89609 Pressure ulcer of unspecified heel, unspecified stage: Secondary | ICD-10-CM | POA: Insufficient documentation

## 2014-07-06 DIAGNOSIS — L8994 Pressure ulcer of unspecified site, stage 4: Secondary | ICD-10-CM | POA: Diagnosis not present

## 2014-07-13 ENCOUNTER — Telehealth: Payer: Self-pay | Admitting: Internal Medicine

## 2014-07-13 NOTE — Telephone Encounter (Signed)
[name] from [facility] called stating she did receive script back for INR tester but the one chosen is no longer available.  They are requesting a script for a different one to be sent in.

## 2014-07-13 NOTE — Telephone Encounter (Addendum)
I will need info on which "equipment" is available to order same Also, has pt been educated on new machine/equipement With this info, I can complete new form thanks

## 2014-07-14 NOTE — Telephone Encounter (Signed)
I have contact info for [facility] under the contact info section.  Let me know if you need me to call.

## 2014-07-14 NOTE — Telephone Encounter (Signed)
Spoke with Vivien Rota. She refaxed form for [employer]. Given to MD for sign off.

## 2014-07-15 ENCOUNTER — Encounter: Payer: Self-pay | Admitting: Internal Medicine

## 2014-07-16 ENCOUNTER — Telehealth: Payer: Self-pay | Admitting: *Deleted

## 2014-07-16 NOTE — Telephone Encounter (Signed)
Requesting to speak with stephanie concerning the script on the PT/INR monitor...[name]

## 2014-07-16 NOTE — Telephone Encounter (Signed)
Faxed INR order on 07/15/2014

## 2014-07-16 NOTE — Telephone Encounter (Signed)
Needed an order for the [place] Check. The alere InRatio is not available anymore.   The corrected order has been faxed to [facility].

## 2014-07-24 ENCOUNTER — Ambulatory Visit (INDEPENDENT_AMBULATORY_CARE_PROVIDER_SITE_OTHER): Payer: Medicare Other | Admitting: Family Medicine

## 2014-07-24 DIAGNOSIS — Z5181 Encounter for therapeutic drug level monitoring: Secondary | ICD-10-CM

## 2014-07-24 LAB — POCT INR: INR: 1.8

## 2014-07-29 ENCOUNTER — Other Ambulatory Visit: Payer: Self-pay | Admitting: Internal Medicine

## 2014-07-30 ENCOUNTER — Encounter: Payer: Self-pay | Admitting: Internal Medicine

## 2014-07-30 LAB — URINALYSIS W MICROSCOPIC + REFLEX CULTURE
BILIRUBIN UA: NEGATIVE
GLUCOSE UR: NEGATIVE
KETONES UA: NEGATIVE
Nitrite, UA: NEGATIVE
Specific Gravity, Urine: 1.005
UROBILINOGEN UA: 4 — AB
pH, Initial: 8

## 2014-07-31 ENCOUNTER — Ambulatory Visit (INDEPENDENT_AMBULATORY_CARE_PROVIDER_SITE_OTHER): Payer: Medicare Other | Admitting: Family Medicine

## 2014-07-31 ENCOUNTER — Ambulatory Visit: Payer: Self-pay | Admitting: *Deleted

## 2014-07-31 ENCOUNTER — Telehealth: Payer: Self-pay | Admitting: Internal Medicine

## 2014-07-31 DIAGNOSIS — I82403 Acute embolism and thrombosis of unspecified deep veins of lower extremity, bilateral: Secondary | ICD-10-CM

## 2014-07-31 DIAGNOSIS — L89519 Pressure ulcer of right ankle, unspecified stage: Secondary | ICD-10-CM

## 2014-07-31 DIAGNOSIS — Z5181 Encounter for therapeutic drug level monitoring: Secondary | ICD-10-CM

## 2014-07-31 DIAGNOSIS — L89892 Pressure ulcer of other site, stage 2: Secondary | ICD-10-CM

## 2014-07-31 LAB — POCT INR: INR: 4.9

## 2014-07-31 NOTE — Telephone Encounter (Signed)
Patty from [facility] would like for you to call her back regarding the pt.  She says there's a lot of information to provide and would prefer to speak to you directly.

## 2014-07-31 NOTE — Telephone Encounter (Signed)
Spoke to [employer] and gave MD response.   Dr. Linna Darner signed the order for the new compression socks. This order has been faxed to [facility].

## 2014-07-31 NOTE — Telephone Encounter (Signed)
Noted - Ok to DC current TED and measure for new hose size for future use  No change on order for suprapub cath exchange or vitals - if pt refuses, document same and try again next as scheduled  Please be sure our [facility] anticoag nurse is aware of supratheraputic INR as RN here manages same per protocol  Wound care as per routine nursing order for secub ulcers noted  Thanks!

## 2014-07-31 NOTE — Telephone Encounter (Signed)
Spoke with Patty with [facility].  She seen pt this morning to do wound care and has a lot of information regarding the visit.   Pt refused Suprapubic catheter change and vitals also.  Pt did allow aid to do a fingerstick: INR is 4.9 and Protime is 59.2. Pt stated that Diflucan was taken yesterday.   There are 4 decubitus ulcers to note  Sacral Right Heel Left Heel  Right dorsal foot  Patty thinks the TED hose is causing the sores on her feet. The hose are leaving red lines and streaks on the pt legs and there are sores beginning to form on the red streaks leading up to the knees on both legs. Patty wanted to know if the hose could be removed. I tentatively said yes considering the number of other sores that the pt has and the indication that new ones are starting to form.

## 2014-08-03 DIAGNOSIS — L89899 Pressure ulcer of other site, unspecified stage: Secondary | ICD-10-CM | POA: Diagnosis not present

## 2014-08-03 DIAGNOSIS — L89309 Pressure ulcer of unspecified buttock, unspecified stage: Secondary | ICD-10-CM | POA: Diagnosis not present

## 2014-08-03 DIAGNOSIS — L8994 Pressure ulcer of unspecified site, stage 4: Secondary | ICD-10-CM | POA: Diagnosis not present

## 2014-08-03 DIAGNOSIS — L89609 Pressure ulcer of unspecified heel, unspecified stage: Secondary | ICD-10-CM | POA: Diagnosis not present

## 2014-08-04 NOTE — Progress Notes (Signed)
[facility]  NAME:  [name], [name]                    ACCOUNT NO.:  MEDICAL RECORD NO.:  [id number]      DATE OF BIRTH:  [birth year]  PHYSICIAN:  Irene Limbo, MD    VISIT DATE:  08/03/2014                                  OFFICE VISIT   CHIEF COMPLAINT:  Followup of multiple pressure ulcerations.  HISTORY OF PRESENT ILLNESS:  The patient is a 58 year old paraplegic female that is living at home following discharge from the nursing facility.  She does not receive home health with the exception of one time a month.  We received a call from her home health nurse last week regarding instituting Medihoney dressings.  The patient has bilateral heel ulcers and uses soft boots and states that she hangs her legs over a pillow so that there is no pressure on her wound.  She also developed new ulceration over the dorsum of one of her feet, which she states is secondary to the wrap she received here at her last visit at the [facility].  We did receive prealbumin dated July 10, 2014, which is 11, one prior to that was 14.  We reviewed additional methods for protein supplementation as she is not taking any currently.  We did attempt to order a low air loss mattress for home, however, the patient states she is not eligible.  On examination, she has a left ischial ulcer that is stage IV in nature measured as 1.5 x 2.6 x 1.9 cm.  Over the left heel is a stage III pressure ulcer measured at 2.4 x 3.1 x 0.3 cm.  Over the right heel is a stage III ulceration measured at 2.5 x 3 x 0.2 cm.  There is also a new stage II ulceration over the right dorsal foot measured as 1 x 1.4 x 0.2 cm.  The patient has the anesthetic in the areas described.  Selective debridement was performed with scissors over the left ischium left heel and right heel over their entirety to remove superficial slough present. Left heel and right heel do demonstrate some contraction.  The remainder of the  wounds are unchanged.  We will plan for followup in 1 month's time.  We will institute Medihoney dressings over the heels and anterior foot with Border dressing.  She will continue collagen to her ischium.          ______________________________ Irene Limbo, MD     BT/MEDQ  D:  08/03/2014  T:  08/04/2014  Job:  [id number]

## 2014-08-06 ENCOUNTER — Ambulatory Visit (INDEPENDENT_AMBULATORY_CARE_PROVIDER_SITE_OTHER): Payer: Medicare Other | Admitting: *Deleted

## 2014-08-06 DIAGNOSIS — Z5181 Encounter for therapeutic drug level monitoring: Secondary | ICD-10-CM

## 2014-08-06 LAB — POCT INR: INR: 1.6

## 2014-08-11 DIAGNOSIS — L89309 Pressure ulcer of unspecified buttock, unspecified stage: Secondary | ICD-10-CM

## 2014-08-11 DIAGNOSIS — N319 Neuromuscular dysfunction of bladder, unspecified: Secondary | ICD-10-CM

## 2014-08-11 DIAGNOSIS — G8250 Quadriplegia, unspecified: Secondary | ICD-10-CM

## 2014-08-11 DIAGNOSIS — Z466 Encounter for fitting and adjustment of urinary device: Secondary | ICD-10-CM

## 2014-08-14 ENCOUNTER — Ambulatory Visit (INDEPENDENT_AMBULATORY_CARE_PROVIDER_SITE_OTHER): Payer: Medicare Other | Admitting: *Deleted

## 2014-08-14 DIAGNOSIS — Z5181 Encounter for therapeutic drug level monitoring: Secondary | ICD-10-CM

## 2014-08-14 LAB — POCT INR: INR: 1.7

## 2014-08-17 DIAGNOSIS — G8250 Quadriplegia, unspecified: Secondary | ICD-10-CM | POA: Insufficient documentation

## 2014-08-19 LAB — POCT INR
INR: 2.3 — AB (ref 0.9–1.1)
INR: 2.3 — AB (ref 0.9–1.1)

## 2014-08-20 ENCOUNTER — Ambulatory Visit (INDEPENDENT_AMBULATORY_CARE_PROVIDER_SITE_OTHER): Payer: Medicare Other | Admitting: *Deleted

## 2014-08-20 DIAGNOSIS — Z5181 Encounter for therapeutic drug level monitoring: Secondary | ICD-10-CM

## 2014-08-20 LAB — POCT INR: INR: 2.3

## 2014-08-21 ENCOUNTER — Telehealth: Payer: Self-pay | Admitting: Internal Medicine

## 2014-08-21 NOTE — Telephone Encounter (Signed)
FYI - [name] with [facility] called stating the patient refused to let her draw the pre albumin for the 2nd time.

## 2014-08-24 NOTE — Telephone Encounter (Signed)
No order change - pt refusal noted Thanks for the update

## 2014-08-27 ENCOUNTER — Telehealth: Payer: Self-pay | Admitting: *Deleted

## 2014-08-27 NOTE — Telephone Encounter (Signed)
Left msg on triage needing home health to give her flu shot & pneumonia shot. Called pt back inform her she need to have home health nurse call or fax order to md to ok...[name]

## 2014-09-01 ENCOUNTER — Other Ambulatory Visit: Payer: Self-pay | Admitting: Internal Medicine

## 2014-09-03 ENCOUNTER — Telehealth: Payer: Self-pay

## 2014-09-03 ENCOUNTER — Other Ambulatory Visit: Payer: Self-pay | Admitting: Internal Medicine

## 2014-09-03 NOTE — Telephone Encounter (Signed)
Tried to fax several times starting 09/01/14.   Called in rx after 3 rd attempt failed.

## 2014-09-07 ENCOUNTER — Encounter (HOSPITAL_BASED_OUTPATIENT_CLINIC_OR_DEPARTMENT_OTHER): Payer: Medicare Other | Attending: Plastic Surgery

## 2014-09-07 DIAGNOSIS — L97419 Non-pressure chronic ulcer of right heel and midfoot with unspecified severity: Secondary | ICD-10-CM | POA: Diagnosis not present

## 2014-09-07 DIAGNOSIS — L98499 Non-pressure chronic ulcer of skin of other sites with unspecified severity: Secondary | ICD-10-CM | POA: Insufficient documentation

## 2014-09-07 DIAGNOSIS — L97429 Non-pressure chronic ulcer of left heel and midfoot with unspecified severity: Secondary | ICD-10-CM | POA: Insufficient documentation

## 2014-09-07 LAB — POCT INR
INR: 1.7 — AB (ref 0.9–1.1)
INR: 1.7 — AB (ref 0.9–1.1)

## 2014-09-08 ENCOUNTER — Ambulatory Visit (INDEPENDENT_AMBULATORY_CARE_PROVIDER_SITE_OTHER): Payer: Medicare Other | Admitting: *Deleted

## 2014-09-08 DIAGNOSIS — Z5181 Encounter for therapeutic drug level monitoring: Secondary | ICD-10-CM

## 2014-09-08 LAB — POCT INR: INR: 1.7

## 2014-09-16 ENCOUNTER — Ambulatory Visit: Payer: Medicare Other | Admitting: Internal Medicine

## 2014-09-21 ENCOUNTER — Ambulatory Visit (INDEPENDENT_AMBULATORY_CARE_PROVIDER_SITE_OTHER): Payer: Medicare Other | Admitting: *Deleted

## 2014-09-21 DIAGNOSIS — Z5181 Encounter for therapeutic drug level monitoring: Secondary | ICD-10-CM

## 2014-09-21 LAB — POCT INR
INR: 3.1
INR: 3.1 — AB (ref <=1.1)

## 2014-09-22 ENCOUNTER — Telehealth: Payer: Self-pay | Admitting: Internal Medicine

## 2014-09-22 NOTE — Telephone Encounter (Signed)
[name] from  [facility], need order for re certification for patient,  from 10-23 to 12-21

## 2014-09-23 ENCOUNTER — Encounter: Payer: Self-pay | Admitting: Internal Medicine

## 2014-09-23 ENCOUNTER — Telehealth: Payer: Self-pay | Admitting: Internal Medicine

## 2014-09-23 NOTE — Telephone Encounter (Signed)
(  [name] from [facility], need order for re certification for patient, ) Regarding this statement she [name] from [facility], didn't leave a call back number she stated that the phone # was on the form she faxed over.c

## 2014-09-23 NOTE — Telephone Encounter (Signed)
Pt called in and said that she is request a alternating airflow mattress.  Not the pad, a Mattress.  [facility] for [facility] to get this

## 2014-09-24 NOTE — Telephone Encounter (Signed)
[name] called back in regards to this today.  She can be reached at [phone].

## 2014-09-24 NOTE — Telephone Encounter (Signed)
Called [name] back and she informed me that she is no longer working there.    Called [facility]

## 2014-09-28 ENCOUNTER — Telehealth: Payer: Self-pay | Admitting: *Deleted

## 2014-09-28 DIAGNOSIS — G8250 Quadriplegia, unspecified: Secondary | ICD-10-CM

## 2014-09-28 NOTE — Telephone Encounter (Signed)
Called pt gave her md response. She states normally the md has to fax them an order. Inform pt since Dr. Asa Lente is out of the office & she know her history will  Hold msg until she return on 11/2, and give her a call back with her response...[name]

## 2014-09-28 NOTE — Telephone Encounter (Signed)
Left msg on triage requesting a order to be sent to [facility] for Alternating Air Flow Mattress. MD is out of office pls advise...[name]

## 2014-09-28 NOTE — Telephone Encounter (Signed)
   [facility] will need to send the forms with a supporting diagnoses.  She has not been seen by her primary care physician since 03/18/14 and this durable equipment would require documentation.

## 2014-09-29 ENCOUNTER — Other Ambulatory Visit: Payer: Self-pay | Admitting: Internal Medicine

## 2014-09-29 NOTE — Telephone Encounter (Signed)
See duplicate message from 25/83 phone note re: DME i will address and sign on my return 11/2 thanks

## 2014-10-01 ENCOUNTER — Other Ambulatory Visit: Payer: Self-pay | Admitting: Internal Medicine

## 2014-10-01 NOTE — Telephone Encounter (Signed)
Valium was faxed on 09/30/14

## 2014-10-05 LAB — POCT INR: INR: 2.1 — AB (ref 0.9–1.1)

## 2014-10-05 NOTE — Telephone Encounter (Signed)
I will sign orders - if no  "pre printed order form", please create rx for same with dx on problem list for tetraplegia THANK YOU

## 2014-10-05 NOTE — Telephone Encounter (Signed)
Notified pt md sign rx for air flow mattress pt wanting rx mail to her address...[name]

## 2014-10-12 ENCOUNTER — Encounter (HOSPITAL_BASED_OUTPATIENT_CLINIC_OR_DEPARTMENT_OTHER): Payer: Medicare Other | Attending: Plastic Surgery

## 2014-10-12 DIAGNOSIS — L89892 Pressure ulcer of other site, stage 2: Secondary | ICD-10-CM | POA: Diagnosis not present

## 2014-10-12 DIAGNOSIS — L89623 Pressure ulcer of left heel, stage 3: Secondary | ICD-10-CM | POA: Diagnosis not present

## 2014-10-12 DIAGNOSIS — L89613 Pressure ulcer of right heel, stage 3: Secondary | ICD-10-CM | POA: Insufficient documentation

## 2014-10-12 DIAGNOSIS — L89324 Pressure ulcer of left buttock, stage 4: Secondary | ICD-10-CM | POA: Insufficient documentation

## 2014-10-19 LAB — POCT INR: INR: 3.3 — AB (ref <=1.1)

## 2014-10-21 ENCOUNTER — Ambulatory Visit (INDEPENDENT_AMBULATORY_CARE_PROVIDER_SITE_OTHER): Payer: Medicare Other

## 2014-10-21 ENCOUNTER — Telehealth: Payer: Self-pay | Admitting: Family

## 2014-10-21 DIAGNOSIS — Z5181 Encounter for therapeutic drug level monitoring: Secondary | ICD-10-CM

## 2014-10-21 DIAGNOSIS — Z7901 Long term (current) use of anticoagulants: Secondary | ICD-10-CM

## 2014-10-21 DIAGNOSIS — I82409 Acute embolism and thrombosis of unspecified deep veins of unspecified lower extremity: Secondary | ICD-10-CM

## 2014-10-21 LAB — POCT INR: INR: 3.3

## 2014-10-21 NOTE — Telephone Encounter (Signed)
Angela Lewandowsky, RN made patient aware.

## 2014-10-21 NOTE — Telephone Encounter (Signed)
Recommend dropping down to 18. Take 1/2 tablet daily except for Monday and Friday. Recheck in 2 weeks.

## 2014-10-21 NOTE — Patient Instructions (Signed)
Notified patient of Terri Piedra FNP recommendation of decreasing to 0.5 tablets every day except Monday and Friday (take 1 tablet).

## 2014-10-23 ENCOUNTER — Encounter: Payer: Self-pay | Admitting: Internal Medicine

## 2014-10-28 ENCOUNTER — Telehealth: Payer: Self-pay | Admitting: *Deleted

## 2014-10-28 NOTE — Telephone Encounter (Addendum)
Refill request for diazepam Last filled by MD on- 10/01/14 #60 x0 Please advise refill?

## 2014-10-30 ENCOUNTER — Other Ambulatory Visit: Payer: Self-pay

## 2014-10-30 MED ORDER — DIAZEPAM 5 MG PO TABS: 5.0000 mg | ORAL_TABLET | Freq: Two times a day (BID) | ORAL | Status: DC | PRN

## 2014-11-03 NOTE — Telephone Encounter (Signed)
Left msg on triage stating pharmacy been trying to get refills on her Diazepam. They have not received response. Pt is requesting call back on status...[name]

## 2014-11-03 NOTE — Telephone Encounter (Signed)
Faxed refill request per [facility] on Friday 10/30/14.   Will refax now.

## 2014-11-04 ENCOUNTER — Other Ambulatory Visit: Payer: Self-pay

## 2014-11-09 ENCOUNTER — Telehealth: Payer: Self-pay | Admitting: Internal Medicine

## 2014-11-09 ENCOUNTER — Ambulatory Visit (INDEPENDENT_AMBULATORY_CARE_PROVIDER_SITE_OTHER): Payer: Medicare Other | Admitting: Certified Registered Nurse Anesthetist

## 2014-11-09 DIAGNOSIS — I82409 Acute embolism and thrombosis of unspecified deep veins of unspecified lower extremity: Secondary | ICD-10-CM

## 2014-11-09 DIAGNOSIS — Z5181 Encounter for therapeutic drug level monitoring: Secondary | ICD-10-CM

## 2014-11-09 LAB — POCT INR
INR: 1.6
INR: 1.6 — AB (ref 0.9–1.1)

## 2014-11-09 NOTE — Telephone Encounter (Signed)
See antiocoag encounter for follow-up to INR

## 2014-11-09 NOTE — Telephone Encounter (Signed)
Pt calling in INR 1.6 and dosage is 2mg  every day except for Monday and Friday and that is 4mg .

## 2014-11-16 ENCOUNTER — Encounter (HOSPITAL_BASED_OUTPATIENT_CLINIC_OR_DEPARTMENT_OTHER): Payer: Medicare Other | Attending: Plastic Surgery

## 2014-11-16 DIAGNOSIS — L89324 Pressure ulcer of left buttock, stage 4: Secondary | ICD-10-CM | POA: Insufficient documentation

## 2014-11-16 DIAGNOSIS — L89623 Pressure ulcer of left heel, stage 3: Secondary | ICD-10-CM | POA: Diagnosis present

## 2014-11-16 DIAGNOSIS — L89613 Pressure ulcer of right heel, stage 3: Secondary | ICD-10-CM | POA: Insufficient documentation

## 2014-11-16 DIAGNOSIS — L89890 Pressure ulcer of other site, unstageable: Secondary | ICD-10-CM | POA: Insufficient documentation

## 2014-11-18 ENCOUNTER — Telehealth: Payer: Self-pay | Admitting: Internal Medicine

## 2014-11-18 NOTE — Telephone Encounter (Signed)
Pt called stated [facility] is going to stop coming out to her house to take care of her wound at the end of year ([facility] is only do [facility] for her). Pt stated that she still going to the wound care center and need help with home care. Please advise, pt is not sure what to do at this point because she still need help.

## 2014-11-18 NOTE — Telephone Encounter (Signed)
Spoke to pt and pt stated that she needs the Home Care for more than just the wound care. Pt has a catheter and also needs an aid.   Please advise if we need to do a referral for Home Health. Pt stated that she does have an appointment for the 23rd of this month.

## 2014-11-18 NOTE — Telephone Encounter (Signed)
We will review and work to arrange [facility] to the best of our ability at [place] No new orders until then thanks

## 2014-11-23 ENCOUNTER — Other Ambulatory Visit: Payer: Self-pay | Admitting: Internal Medicine

## 2014-11-23 ENCOUNTER — Telehealth: Payer: Self-pay | Admitting: Internal Medicine

## 2014-11-23 LAB — POCT INR: INR: 1.9 — AB (ref 0.9–1.1)

## 2014-11-23 NOTE — Telephone Encounter (Signed)
Pt just calling in her INR   1.9

## 2014-11-23 NOTE — Telephone Encounter (Signed)
Please advise pt on any changes.

## 2014-11-24 ENCOUNTER — Telehealth: Payer: Self-pay

## 2014-11-24 ENCOUNTER — Other Ambulatory Visit: Payer: Self-pay | Admitting: Internal Medicine

## 2014-11-24 NOTE — Telephone Encounter (Signed)
Advised pt to take 1 tablet today and then resume normal dosing schedule.

## 2014-11-24 NOTE — Telephone Encounter (Signed)
Tried to fax rx for diazepam. Fax did not go through.  Called pharmacy and gave refill information to pharmacist.

## 2014-11-25 ENCOUNTER — Other Ambulatory Visit (INDEPENDENT_AMBULATORY_CARE_PROVIDER_SITE_OTHER): Payer: Medicare Other

## 2014-11-25 ENCOUNTER — Ambulatory Visit (INDEPENDENT_AMBULATORY_CARE_PROVIDER_SITE_OTHER): Payer: Medicare Other

## 2014-11-25 ENCOUNTER — Ambulatory Visit: Payer: Medicare Other | Admitting: Internal Medicine

## 2014-11-25 ENCOUNTER — Ambulatory Visit (INDEPENDENT_AMBULATORY_CARE_PROVIDER_SITE_OTHER): Payer: Medicare Other | Admitting: Family

## 2014-11-25 ENCOUNTER — Encounter: Payer: Self-pay | Admitting: Family

## 2014-11-25 VITALS — BP 132/82 | HR 63 | Temp 97.9°F | Resp 16

## 2014-11-25 DIAGNOSIS — Z23 Encounter for immunization: Secondary | ICD-10-CM

## 2014-11-25 DIAGNOSIS — L89519 Pressure ulcer of right ankle, unspecified stage: Secondary | ICD-10-CM

## 2014-11-25 DIAGNOSIS — E638 Other specified nutritional deficiencies: Secondary | ICD-10-CM

## 2014-11-25 DIAGNOSIS — E876 Hypokalemia: Secondary | ICD-10-CM

## 2014-11-25 LAB — BASIC METABOLIC PANEL
BUN: 19 mg/dL (ref 6–23)
CALCIUM: 9.2 mg/dL (ref 8.4–10.5)
CO2: 31 meq/L (ref 19–32)
Chloride: 99 mEq/L (ref 96–112)
Creatinine, Ser: 0.5 mg/dL (ref 0.4–1.2)
GFR: 140.32 mL/min (ref >60.00)
GLUCOSE: 96 mg/dL (ref 70–99)
Potassium: 3.3 mEq/L — ABNORMAL LOW (ref 3.5–5.1)
SODIUM: 137 meq/L (ref 135–145)

## 2014-11-25 NOTE — Assessment & Plan Note (Signed)
Stable at this time. Continue management per wound care physician.
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
## 2014-11-25 NOTE — Patient Instructions (Addendum)
Thank you for choosing [facility].  Summary/Instructions:  Please stop by the lab on the basement level of the building for your blood work. Your results will be released to [facility] (or called to you) after review, usually within 72hours after test completion. If any changes need to be made, you will be notified at that same time.

## 2014-11-25 NOTE — Progress Notes (Signed)
Pre visit review using our clinic review tool, if applicable. No additional management support is needed unless otherwise documented below in the visit note. 

## 2014-12-07 ENCOUNTER — Telehealth: Payer: Self-pay | Admitting: Internal Medicine

## 2014-12-07 DIAGNOSIS — G8250 Quadriplegia, unspecified: Secondary | ICD-10-CM

## 2014-12-07 DIAGNOSIS — Z5181 Encounter for therapeutic drug level monitoring: Secondary | ICD-10-CM

## 2014-12-07 LAB — POCT INR: INR: 1.8 — AB (ref 0.9–1.1)

## 2014-12-07 NOTE — Telephone Encounter (Signed)
Pt request written Rx for Low air loss mattress to be send to [facility] Fax [phone]. Pt also want to check on the home health referral that she request, pt stated that she really need help at home. Please advise.

## 2014-12-07 NOTE — Telephone Encounter (Signed)
Pt called in to report INR  1.8

## 2014-12-08 NOTE — Telephone Encounter (Signed)
Z51.81 is code for INR check

## 2014-12-08 NOTE — Telephone Encounter (Signed)
Pt states that the wording on the order was not worded correctly.  Pt stated that the order needs to read "Low Air Loss Mattress"  Fax to [phone]  Pt also needs the CPT code to do INR's home. Please advise.

## 2014-12-08 NOTE — Telephone Encounter (Signed)
Fax [phone] (listed incorrectly below).

## 2014-12-08 NOTE — Telephone Encounter (Signed)
Pt called in she said she thought she would have got a call back about her INR.  She would like have a return call from the nurse

## 2014-12-09 ENCOUNTER — Ambulatory Visit (INDEPENDENT_AMBULATORY_CARE_PROVIDER_SITE_OTHER): Payer: No Typology Code available for payment source | Admitting: Family Medicine

## 2014-12-09 ENCOUNTER — Other Ambulatory Visit: Payer: Self-pay | Admitting: Family Medicine

## 2014-12-09 DIAGNOSIS — I82409 Acute embolism and thrombosis of unspecified deep veins of unspecified lower extremity: Secondary | ICD-10-CM

## 2014-12-09 DIAGNOSIS — Z5181 Encounter for therapeutic drug level monitoring: Secondary | ICD-10-CM

## 2014-12-09 LAB — POCT INR: INR: 1.8

## 2014-12-09 MED ORDER — WARFARIN SODIUM 2 MG PO TABS: ORAL_TABLET | ORAL | Status: DC

## 2014-12-09 NOTE — Telephone Encounter (Signed)
See anticoag encounter

## 2014-12-10 NOTE — Telephone Encounter (Signed)
Faxed request to [facility]

## 2014-12-14 ENCOUNTER — Encounter (HOSPITAL_BASED_OUTPATIENT_CLINIC_OR_DEPARTMENT_OTHER): Payer: Medicare HMO | Attending: Plastic Surgery

## 2014-12-14 DIAGNOSIS — L8944 Pressure ulcer of contiguous site of back, buttock and hip, stage 4: Secondary | ICD-10-CM | POA: Insufficient documentation

## 2014-12-14 DIAGNOSIS — L89624 Pressure ulcer of left heel, stage 4: Secondary | ICD-10-CM | POA: Insufficient documentation

## 2014-12-14 DIAGNOSIS — L89619 Pressure ulcer of right heel, unspecified stage: Secondary | ICD-10-CM | POA: Insufficient documentation

## 2014-12-14 DIAGNOSIS — L89893 Pressure ulcer of other site, stage 3: Secondary | ICD-10-CM | POA: Insufficient documentation

## 2014-12-17 DIAGNOSIS — N319 Neuromuscular dysfunction of bladder, unspecified: Secondary | ICD-10-CM | POA: Diagnosis not present

## 2014-12-17 DIAGNOSIS — G8250 Quadriplegia, unspecified: Secondary | ICD-10-CM | POA: Diagnosis not present

## 2014-12-17 DIAGNOSIS — L89313 Pressure ulcer of right buttock, stage 3: Secondary | ICD-10-CM | POA: Diagnosis not present

## 2014-12-17 DIAGNOSIS — Z436 Encounter for attention to other artificial openings of urinary tract: Secondary | ICD-10-CM | POA: Diagnosis not present

## 2014-12-21 DIAGNOSIS — L89624 Pressure ulcer of left heel, stage 4: Secondary | ICD-10-CM | POA: Diagnosis not present

## 2014-12-21 DIAGNOSIS — L89893 Pressure ulcer of other site, stage 3: Secondary | ICD-10-CM | POA: Diagnosis not present

## 2014-12-21 DIAGNOSIS — L8944 Pressure ulcer of contiguous site of back, buttock and hip, stage 4: Secondary | ICD-10-CM | POA: Diagnosis not present

## 2014-12-21 DIAGNOSIS — L89619 Pressure ulcer of right heel, unspecified stage: Secondary | ICD-10-CM | POA: Diagnosis not present

## 2014-12-21 LAB — PROTIME-INR: INR: 2.3 — AB (ref 0.9–1.1)

## 2014-12-21 LAB — POCT INR
INR: 2.3
INR: 2.3 — AB (ref 0.9–1.1)

## 2014-12-22 ENCOUNTER — Ambulatory Visit (INDEPENDENT_AMBULATORY_CARE_PROVIDER_SITE_OTHER): Payer: Medicare HMO

## 2014-12-22 DIAGNOSIS — Z5181 Encounter for therapeutic drug level monitoring: Secondary | ICD-10-CM

## 2014-12-22 NOTE — Progress Notes (Signed)
Pt called in INR.  States INR was 2.3.

## 2014-12-28 ENCOUNTER — Encounter: Payer: Self-pay | Admitting: Internal Medicine

## 2014-12-29 ENCOUNTER — Other Ambulatory Visit: Payer: Self-pay | Admitting: Internal Medicine

## 2015-01-06 LAB — POCT INR: INR: 2.6 — AB (ref 0.9–1.1)

## 2015-01-12 ENCOUNTER — Telehealth: Payer: Self-pay

## 2015-01-12 ENCOUNTER — Encounter: Payer: Self-pay | Admitting: Internal Medicine

## 2015-01-12 NOTE — Telephone Encounter (Signed)
Patient enrollment for [facility] at home PT-INR signed and faxed.

## 2015-01-18 ENCOUNTER — Ambulatory Visit (INDEPENDENT_AMBULATORY_CARE_PROVIDER_SITE_OTHER): Payer: Medicare HMO | Admitting: General Practice

## 2015-01-18 ENCOUNTER — Telehealth: Payer: Self-pay | Admitting: Internal Medicine

## 2015-01-18 ENCOUNTER — Encounter (HOSPITAL_BASED_OUTPATIENT_CLINIC_OR_DEPARTMENT_OTHER): Payer: Medicare HMO | Attending: Plastic Surgery

## 2015-01-18 DIAGNOSIS — L89623 Pressure ulcer of left heel, stage 3: Secondary | ICD-10-CM | POA: Insufficient documentation

## 2015-01-18 DIAGNOSIS — L89324 Pressure ulcer of left buttock, stage 4: Secondary | ICD-10-CM | POA: Insufficient documentation

## 2015-01-18 DIAGNOSIS — L89893 Pressure ulcer of other site, stage 3: Secondary | ICD-10-CM | POA: Insufficient documentation

## 2015-01-18 DIAGNOSIS — Z5181 Encounter for therapeutic drug level monitoring: Secondary | ICD-10-CM

## 2015-01-18 LAB — POCT INR
INR: 2.8
INR: 2.8 — AB (ref 0.9–1.1)

## 2015-01-18 NOTE — Telephone Encounter (Signed)
Pt called to report INR 2.8. Please call and speak to her

## 2015-01-18 NOTE — Progress Notes (Signed)
Pre visit review using our clinic review tool, if applicable. No additional management support is needed unless otherwise documented below in the visit note. 

## 2015-01-20 ENCOUNTER — Telehealth: Payer: Self-pay

## 2015-01-20 NOTE — Telephone Encounter (Signed)
[name] with [facility] needed verbal okay for recertification of pt with Home Health.   Per MD verbal okay for recert.

## 2015-01-21 ENCOUNTER — Encounter: Payer: Self-pay | Admitting: Internal Medicine

## 2015-01-25 DIAGNOSIS — L89893 Pressure ulcer of other site, stage 3: Secondary | ICD-10-CM | POA: Diagnosis present

## 2015-01-25 DIAGNOSIS — L89623 Pressure ulcer of left heel, stage 3: Secondary | ICD-10-CM | POA: Diagnosis not present

## 2015-01-25 DIAGNOSIS — L89324 Pressure ulcer of left buttock, stage 4: Secondary | ICD-10-CM | POA: Diagnosis present

## 2015-01-28 ENCOUNTER — Other Ambulatory Visit: Payer: Self-pay | Admitting: Internal Medicine

## 2015-02-01 LAB — POCT INR: INR: 2.5 — AB (ref 0.9–1.1)

## 2015-02-02 ENCOUNTER — Ambulatory Visit: Payer: Medicare HMO | Admitting: General Practice

## 2015-02-02 DIAGNOSIS — N319 Neuromuscular dysfunction of bladder, unspecified: Secondary | ICD-10-CM | POA: Diagnosis not present

## 2015-02-02 DIAGNOSIS — Z5181 Encounter for therapeutic drug level monitoring: Secondary | ICD-10-CM

## 2015-02-02 LAB — POCT INR: INR: 2.5

## 2015-02-02 NOTE — Progress Notes (Signed)
Pre visit review using our clinic review tool, if applicable. No additional management support is needed unless otherwise documented below in the visit note. 

## 2015-02-02 NOTE — Progress Notes (Signed)
Agree with plan 

## 2015-02-03 ENCOUNTER — Telehealth: Payer: Self-pay | Admitting: Internal Medicine

## 2015-02-03 NOTE — Telephone Encounter (Signed)
[name] called from [facility] want to verify the testing frequency of INR at home. Pt stated for 2 times a month but [name] has weekly. Please callher back

## 2015-02-04 NOTE — Telephone Encounter (Signed)
Spoke with [facility] rep and informed that I will leave that decision to the PCP. They are expecting a call back next week when MD returns.

## 2015-02-05 NOTE — Telephone Encounter (Signed)
Every other week is fine thanks

## 2015-02-08 NOTE — Telephone Encounter (Signed)
Informed [facility] of MD response to INR testing every other week.

## 2015-02-22 ENCOUNTER — Encounter (HOSPITAL_BASED_OUTPATIENT_CLINIC_OR_DEPARTMENT_OTHER): Payer: Medicare HMO | Attending: Plastic Surgery

## 2015-02-22 DIAGNOSIS — L89892 Pressure ulcer of other site, stage 2: Secondary | ICD-10-CM | POA: Diagnosis not present

## 2015-02-22 DIAGNOSIS — L89324 Pressure ulcer of left buttock, stage 4: Secondary | ICD-10-CM | POA: Diagnosis present

## 2015-02-22 LAB — POCT INR: INR: 2.8 — AB (ref 0.9–1.1)

## 2015-02-23 ENCOUNTER — Ambulatory Visit: Payer: Medicare HMO | Admitting: General Practice

## 2015-02-23 DIAGNOSIS — Z5181 Encounter for therapeutic drug level monitoring: Secondary | ICD-10-CM

## 2015-02-23 LAB — POCT INR: INR: 2.8

## 2015-02-23 NOTE — Progress Notes (Signed)
Agree with plan 

## 2015-02-23 NOTE — Progress Notes (Signed)
Pre visit review using our clinic review tool, if applicable. No additional management support is needed unless otherwise documented below in the visit note. 

## 2015-02-25 LAB — POCT INR: INR: 2.8 — AB (ref 0.9–1.1)

## 2015-03-01 ENCOUNTER — Encounter: Payer: Self-pay | Admitting: Internal Medicine

## 2015-03-02 ENCOUNTER — Other Ambulatory Visit: Payer: Self-pay

## 2015-03-05 ENCOUNTER — Telehealth: Payer: Self-pay | Admitting: Internal Medicine

## 2015-03-05 MED ORDER — OXYBUTYNIN CHLORIDE 5 MG PO TABS: ORAL_TABLET | ORAL | Status: DC

## 2015-03-05 NOTE — Telephone Encounter (Signed)
Pt called request refill for oxybutynin (DITROPAN) 5 MG tablet to be send to [facility]. Please help is going to be out

## 2015-03-05 NOTE — Telephone Encounter (Signed)
Sent refill to [facility]...Lind Guest

## 2015-03-09 ENCOUNTER — Ambulatory Visit (INDEPENDENT_AMBULATORY_CARE_PROVIDER_SITE_OTHER): Payer: Medicare HMO | Admitting: General Practice

## 2015-03-09 DIAGNOSIS — Z5181 Encounter for therapeutic drug level monitoring: Secondary | ICD-10-CM

## 2015-03-09 LAB — POCT INR: INR: 2.9

## 2015-03-09 NOTE — Progress Notes (Signed)
Pre visit review using our clinic review tool, if applicable. No additional management support is needed unless otherwise documented below in the visit note. 

## 2015-03-09 NOTE — Progress Notes (Signed)
Agree with plan 

## 2015-03-10 ENCOUNTER — Encounter: Payer: Self-pay | Admitting: Internal Medicine

## 2015-03-17 ENCOUNTER — Telehealth: Payer: Self-pay | Admitting: Internal Medicine

## 2015-03-17 NOTE — Telephone Encounter (Signed)
Right Ishium has a pressure wound and is getting larger. Its about 3.5cm depth and width. She stays in her electric chair from about 10 am to 6-8 pm and she is encourage to go back to bed during the day, but she is a patient who will do what she wants. She is going to the wound clinic. Please call Inez Catalina back.

## 2015-03-18 NOTE — Telephone Encounter (Signed)
Agree with recommended tx/rx from wound clinic - defer to their ongoing care Thanks for the note

## 2015-03-22 ENCOUNTER — Encounter (HOSPITAL_BASED_OUTPATIENT_CLINIC_OR_DEPARTMENT_OTHER): Payer: Medicare HMO | Attending: Plastic Surgery

## 2015-03-22 ENCOUNTER — Telehealth: Payer: Self-pay

## 2015-03-22 DIAGNOSIS — L89892 Pressure ulcer of other site, stage 2: Secondary | ICD-10-CM | POA: Insufficient documentation

## 2015-03-22 DIAGNOSIS — L89324 Pressure ulcer of left buttock, stage 4: Secondary | ICD-10-CM | POA: Insufficient documentation

## 2015-03-22 NOTE — Telephone Encounter (Signed)
PA for neomycin/polymyxin b sulfates solutions

## 2015-03-22 NOTE — Telephone Encounter (Signed)
LVM for Angela Catalina RN to call me back at her earliest convenience.   RE: MD note below.

## 2015-03-24 ENCOUNTER — Other Ambulatory Visit (INDEPENDENT_AMBULATORY_CARE_PROVIDER_SITE_OTHER): Payer: Medicare HMO

## 2015-03-24 ENCOUNTER — Encounter: Payer: Self-pay | Admitting: Internal Medicine

## 2015-03-24 ENCOUNTER — Ambulatory Visit (INDEPENDENT_AMBULATORY_CARE_PROVIDER_SITE_OTHER): Payer: Medicare HMO | Admitting: Internal Medicine

## 2015-03-24 VITALS — BP 120/74 | HR 54 | Temp 97.4°F

## 2015-03-24 DIAGNOSIS — L89312 Pressure ulcer of right buttock, stage 2: Secondary | ICD-10-CM | POA: Diagnosis not present

## 2015-03-24 DIAGNOSIS — E785 Hyperlipidemia, unspecified: Secondary | ICD-10-CM

## 2015-03-24 DIAGNOSIS — Z0000 Encounter for general adult medical examination without abnormal findings: Secondary | ICD-10-CM

## 2015-03-24 DIAGNOSIS — Z1211 Encounter for screening for malignant neoplasm of colon: Secondary | ICD-10-CM

## 2015-03-24 DIAGNOSIS — G8250 Quadriplegia, unspecified: Secondary | ICD-10-CM

## 2015-03-24 DIAGNOSIS — Z1239 Encounter for other screening for malignant neoplasm of breast: Secondary | ICD-10-CM

## 2015-03-24 LAB — LIPID PANEL
CHOLESTEROL: 171 mg/dL (ref 0–200)
HDL: 46.2 mg/dL (ref >39.00)
LDL Cholesterol: 102 mg/dL — ABNORMAL HIGH (ref 0–99)
NonHDL: 124.8
Total CHOL/HDL Ratio: 4
Triglycerides: 116 mg/dL (ref 0.0–149.0)
VLDL: 23.2 mg/dL (ref 0.0–40.0)

## 2015-03-24 MED ORDER — BUMETANIDE 1 MG PO TABS: 1.0000 mg | ORAL_TABLET | Freq: Every day | ORAL | Status: DC

## 2015-03-24 MED ORDER — ACETAMINOPHEN-CODEINE #3 300-30 MG PO TABS: 1.0000 | ORAL_TABLET | ORAL | Status: DC | PRN

## 2015-03-24 MED ORDER — POTASSIUM CHLORIDE CRYS ER 20 MEQ PO TBCR: 20.0000 meq | EXTENDED_RELEASE_TABLET | Freq: Every day | ORAL | Status: DC

## 2015-03-24 MED ORDER — DIAZEPAM 5 MG PO TABS: 5.0000 mg | ORAL_TABLET | Freq: Two times a day (BID) | ORAL | Status: DC

## 2015-03-24 MED ORDER — OXYBUTYNIN CHLORIDE 5 MG PO TABS: 5.0000 mg | ORAL_TABLET | Freq: Two times a day (BID) | ORAL | Status: DC
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
## 2015-03-24 NOTE — Assessment & Plan Note (Signed)
On statin therapy for same.  dose increase November 2012 because of uncontrolled lipids Labs reviewed, check annually or as needed

## 2015-03-24 NOTE — Progress Notes (Signed)
Pre visit review using our clinic review tool, if applicable. No additional management support is needed unless otherwise documented below in the visit note. 
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
## 2015-03-24 NOTE — Patient Instructions (Signed)
It was good to see you today.  We have reviewed your prior records including labs and tests today  Health Maintenance reviewed - refer for colon screen and breast screen - all other recommended immunizations and age-appropriate screenings are up-to-date.  Test(s) ordered today. Your results will be released to [place] (or called to you) after review, usually within 72hours after test completion. If any changes need to be made, you will be notified at that same time.  Medications reviewed and updated, no changes recommended at this time.  Please schedule followup in 12 months for annual exam and labs, call sooner if problems.

## 2015-03-26 ENCOUNTER — Telehealth: Payer: Self-pay | Admitting: Internal Medicine

## 2015-03-26 NOTE — Telephone Encounter (Signed)
Pt called in and has several question about her lab results and also wanted to tell some that her home health no showed this week.,   [facility] number [phone]

## 2015-03-29 NOTE — Telephone Encounter (Signed)
This phone note coordinated with another. Closing this one and adding to previous note (03/17/15)

## 2015-03-29 NOTE — Telephone Encounter (Signed)
Inez Catalina RN called back. RN stated that the wound is not getting any better. Suggested a rehab clinic to assist with wound care.   Spoke to pt and pt stated that the wound care clinic measured it at 2.0 cm. Pt stated that the wound is healing fine now, pt stated that she felt that RN was scrubbing too hard on the wound and making it bleed.

## 2015-03-30 ENCOUNTER — Other Ambulatory Visit: Payer: Self-pay | Admitting: Internal Medicine

## 2015-03-30 ENCOUNTER — Telehealth: Payer: Self-pay | Admitting: Internal Medicine

## 2015-03-30 NOTE — Telephone Encounter (Signed)
She is waiting on certification plan of care for certification of through 02/20-0419. They faxed back on 02/24 to have the date added. She hasn't seen it come back yet. Please advise

## 2015-03-30 NOTE — Telephone Encounter (Signed)
Adding to note below. Lindsay's ext is [phone]

## 2015-03-31 NOTE — Telephone Encounter (Signed)
Requested refax certification.

## 2015-03-31 NOTE — Telephone Encounter (Signed)
LVM for call back

## 2015-04-02 NOTE — Telephone Encounter (Signed)
Pt tells me she is following across street at [facility] clinic If referral needed, ok to place to "wound clinic" for ischial pressure ulcer (on prob list) thanks

## 2015-04-05 ENCOUNTER — Telehealth: Payer: Self-pay | Admitting: Internal Medicine

## 2015-04-05 DIAGNOSIS — N319 Neuromuscular dysfunction of bladder, unspecified: Secondary | ICD-10-CM

## 2015-04-05 DIAGNOSIS — L89151 Pressure ulcer of sacral region, stage 1: Secondary | ICD-10-CM

## 2015-04-05 NOTE — Telephone Encounter (Signed)
Patient states [facility] dropped her care.  She is requesting Dr. Asa Lente to find her another home health care facility for her wound care and catheter care.

## 2015-04-05 NOTE — Telephone Encounter (Signed)
Notified pt md has place referral../lmb 

## 2015-04-05 NOTE — Telephone Encounter (Signed)
Order for any [facility] agency available - signed Please follow up with [facility] on same

## 2015-04-06 ENCOUNTER — Ambulatory Visit: Payer: Medicare HMO | Admitting: General Practice

## 2015-04-06 ENCOUNTER — Telehealth: Payer: Self-pay | Admitting: Internal Medicine

## 2015-04-06 DIAGNOSIS — Z5181 Encounter for therapeutic drug level monitoring: Secondary | ICD-10-CM

## 2015-04-06 LAB — POCT INR: INR: 2.7

## 2015-04-06 NOTE — Progress Notes (Signed)
Pre visit review using our clinic review tool, if applicable. No additional management support is needed unless otherwise documented below in the visit note. 

## 2015-04-06 NOTE — Telephone Encounter (Signed)
INR 2.7.

## 2015-04-06 NOTE — Progress Notes (Signed)
Agree with plan 

## 2015-04-14 NOTE — Telephone Encounter (Signed)
Pressure ulcer is documented on problem list.  Pt is following wound clinic for pressure ulcer.

## 2015-04-19 ENCOUNTER — Encounter (HOSPITAL_BASED_OUTPATIENT_CLINIC_OR_DEPARTMENT_OTHER): Payer: Medicare HMO | Attending: Plastic Surgery

## 2015-04-19 DIAGNOSIS — L89324 Pressure ulcer of left buttock, stage 4: Secondary | ICD-10-CM | POA: Insufficient documentation

## 2015-04-19 DIAGNOSIS — L89892 Pressure ulcer of other site, stage 2: Secondary | ICD-10-CM | POA: Insufficient documentation

## 2015-04-20 ENCOUNTER — Ambulatory Visit (INDEPENDENT_AMBULATORY_CARE_PROVIDER_SITE_OTHER): Payer: Medicare HMO | Admitting: General Practice

## 2015-04-20 ENCOUNTER — Telehealth: Payer: Self-pay | Admitting: Internal Medicine

## 2015-04-20 DIAGNOSIS — I82409 Acute embolism and thrombosis of unspecified deep veins of unspecified lower extremity: Secondary | ICD-10-CM

## 2015-04-20 DIAGNOSIS — Z5181 Encounter for therapeutic drug level monitoring: Secondary | ICD-10-CM

## 2015-04-20 LAB — POCT INR: INR: 2.2

## 2015-04-20 NOTE — Progress Notes (Signed)
Pre visit review using our clinic review tool, if applicable. No additional management support is needed unless otherwise documented below in the visit note. 

## 2015-04-20 NOTE — Telephone Encounter (Signed)
Yes - SW or SS would be fine Also see if [facility] would resume service with different wound RN individual (ie, not Inez Catalina) Remind Moon our options are VERY limited for [facility] so she needs to accept the agency offering a different provider Thanks!

## 2015-04-20 NOTE — Telephone Encounter (Signed)
Spoke to pt and she has a list of cath supplies and will fax it to me.

## 2015-04-20 NOTE — Progress Notes (Signed)
Agree with plan 

## 2015-04-20 NOTE — Telephone Encounter (Signed)
Please call patient regarding medical supplies

## 2015-04-20 NOTE — Telephone Encounter (Signed)
Pt wants a new home health agency. I know that we have gone over this multiple times.  Should we get social services involved or what other options are there? Pt stated that her current aid (private paid) put in her notice.           Notes of previous calls:  [name] with [facility] scrubbed too hard on ulcer.  [name] stated that she was too far out to help.

## 2015-04-21 NOTE — Telephone Encounter (Signed)
Pt called back and is requesting to speak to [facility] about this.  They sent supplys and then will get approval from [facility] number [phone]

## 2015-04-22 ENCOUNTER — Telehealth: Payer: Self-pay | Admitting: Internal Medicine

## 2015-04-22 NOTE — Telephone Encounter (Signed)
Looking for verbal orders for skilled nursing 3 times per week for wound mgmt and disease process education and management. She will also receive medical social work services. She also would like a home health aid- can provide frequency if needed

## 2015-04-22 NOTE — Telephone Encounter (Signed)
[place] returned your call

## 2015-04-22 NOTE — Telephone Encounter (Signed)
LVM for [place] to call me back.  Give verbal okay and to verify wound mgmt.

## 2015-04-23 NOTE — Telephone Encounter (Signed)
[place] has returned your call again

## 2015-04-26 DIAGNOSIS — L89324 Pressure ulcer of left buttock, stage 4: Secondary | ICD-10-CM | POA: Diagnosis present

## 2015-04-26 DIAGNOSIS — L89892 Pressure ulcer of other site, stage 2: Secondary | ICD-10-CM | POA: Diagnosis not present

## 2015-04-27 NOTE — Telephone Encounter (Signed)
Physical therapist haley, need verbal order for patient for physical therapy for 1 x a week for one week and 2 x a week for one week please advise. [phone]

## 2015-04-28 ENCOUNTER — Telehealth: Payer: Self-pay | Admitting: Internal Medicine

## 2015-04-28 NOTE — Telephone Encounter (Signed)
[name] from [facility]  Verbal orders Pt  1 week 1 2 week 4

## 2015-04-29 ENCOUNTER — Telehealth: Payer: Self-pay | Admitting: Internal Medicine

## 2015-04-29 NOTE — Telephone Encounter (Signed)
Patient needs a verbal orders for catheter change twice a month 1st and 3rd Mondays sent to [place]. [phone].  She's wanting an order for 48 hour urine culture. (Full order) as when needed with [place]

## 2015-04-29 NOTE — Telephone Encounter (Signed)
Verbal okay given to [facility].

## 2015-04-29 NOTE — Telephone Encounter (Signed)
Called Meredith with [facility] and gave verbal okay to change cath tomorrow.

## 2015-04-29 NOTE — Telephone Encounter (Signed)
[name] from [place].call to set up her cath change and patient stated her cath hasn't been changed since 5/2. [name] is wondering if you want the cath changed tomorrow they will need orders for that. Patient is showing signs of uti

## 2015-04-29 NOTE — Telephone Encounter (Signed)
Verbal ok for cath change schedule Needs to call for order if Ucx needed thanks

## 2015-04-29 NOTE — Telephone Encounter (Signed)
Verbal left with Hildred Alamin.

## 2015-04-29 NOTE — Telephone Encounter (Signed)
Called and spoke to [place] at [place] and gave verbal cath change orders.

## 2015-04-30 NOTE — Telephone Encounter (Signed)
Please call patient asap. She's needing the full order urine culture. The short one was sent. She said her agency has dropped her.

## 2015-04-30 NOTE — Telephone Encounter (Signed)
Pt stated that there is a social work order for them to come out.  "[name] is letting me go on Tuesday because I live by myself and they would be liable if anything happened"  UCX done and catheter changed.

## 2015-05-05 ENCOUNTER — Telehealth: Payer: Self-pay

## 2015-05-05 NOTE — Telephone Encounter (Signed)
[facility] needs a caregiver to teach.  Needed due to paraplegia Not able to take care of herself. May need to call APS for assistance.  Need proof that lift has been serviced in the last year or [facility] will not be able to use.   [facility] may need to drop patient.   New urine sample collected today. Sample from Friday was left in the fridge and is no longer viable.

## 2015-05-06 NOTE — Telephone Encounter (Signed)
Janett Billow from [facility] wanted to let you know that they will not be discharging patient at this time

## 2015-05-06 NOTE — Telephone Encounter (Signed)
Spoke to [employer]. Thanked for keeping pt on. They will do what they can and hope that the pt will be able to attain a caregiver to help her.

## 2015-05-10 ENCOUNTER — Telehealth: Payer: Self-pay | Admitting: Internal Medicine

## 2015-05-10 NOTE — Telephone Encounter (Signed)
They are calling to advise that patient was due to super pubic cath today, but there was no insertion kit in the home. They have ordered one and will administer once they receive it. Also, she states that the patient has a UTI, and needs an antibiotic. The culture was sent over to [place] on Friday. She states that if we will call in to [facility], then they will deliver it to the patient.

## 2015-05-10 NOTE — Telephone Encounter (Signed)
[name] did a culture and she will need an antibiotic. Her pharmacy is [facility]. If we get it sent over asap they will be able to deliver to her today

## 2015-05-11 ENCOUNTER — Telehealth: Payer: Self-pay | Admitting: Internal Medicine

## 2015-05-11 ENCOUNTER — Ambulatory Visit: Payer: Medicare HMO | Admitting: General Practice

## 2015-05-11 DIAGNOSIS — I82409 Acute embolism and thrombosis of unspecified deep veins of unspecified lower extremity: Secondary | ICD-10-CM

## 2015-05-11 DIAGNOSIS — Z5181 Encounter for therapeutic drug level monitoring: Secondary | ICD-10-CM

## 2015-05-11 LAB — POCT INR: INR: 2.8

## 2015-05-11 MED ORDER — SULFAMETHOXAZOLE-TRIMETHOPRIM 800-160 MG PO TABS: 1.0000 | ORAL_TABLET | Freq: Two times a day (BID) | ORAL | Status: AC

## 2015-05-11 NOTE — Telephone Encounter (Signed)
Lab order results received. Pt has a UTI.   Organism is Proteus mirabilis.   Report of sensitivities sent as well.  S <=20 of Trimethoprim/Sulfamethoxazole               S <=04 of Cefoxitin and piperacillin/Tazobactam               S <=01 of Gentamicin, Tobramycin, Ceftriaxone and Cefepime       S         1 of Ceprofloxacin and Levofloxacin      S 2 of Imipenem Please advise on the best course abx treatment during PCP absence.

## 2015-05-11 NOTE — Telephone Encounter (Signed)
Patient wanted to let you know her I&R is 2.8

## 2015-05-11 NOTE — Progress Notes (Signed)
I have reviewed and agree with the plan. 

## 2015-05-11 NOTE — Progress Notes (Signed)
Pre visit review using our clinic review tool, if applicable. No additional management support is needed unless otherwise documented below in the visit note. 

## 2015-05-11 NOTE — Telephone Encounter (Signed)
Patient is calling back regarding an antibiotic being sent to [facility]. Please call patient

## 2015-05-11 NOTE — Telephone Encounter (Signed)
Called pt and informed abx was sent to pharmacy.  Called pharmacy and confirmed that they received rx for abx.

## 2015-05-11 NOTE — Telephone Encounter (Signed)
rx sent

## 2015-05-12 ENCOUNTER — Encounter: Payer: Self-pay | Admitting: Internal Medicine

## 2015-05-12 LAB — UA/M W/RFLX CULTURE, ROUTINE
BILIRUBIN UA: NEGATIVE
Bacteria: 4
Epithelial cells, urine per micros: 2
Glucose, Ur: NEGATIVE
Ketones, UA: NEGATIVE
NITRITE UA: NEGATIVE
OCCULT BLOOD: NEGATIVE
Protein, Ur: 2
Specific Gravity, UA: 1.005 (ref 1.005–1.030)
Triple Phosphate Crystal: 2
Urobilinogen, UA: 4 — AB
WBC, UA: 2
pH, UA: 9 — AB (ref 4.5–8.0)

## 2015-05-19 ENCOUNTER — Ambulatory Visit: Payer: Medicare HMO | Admitting: *Deleted

## 2015-05-19 DIAGNOSIS — Z5181 Encounter for therapeutic drug level monitoring: Secondary | ICD-10-CM

## 2015-05-19 DIAGNOSIS — I82409 Acute embolism and thrombosis of unspecified deep veins of unspecified lower extremity: Secondary | ICD-10-CM

## 2015-05-19 LAB — POCT INR: INR: 5.4

## 2015-05-19 NOTE — Progress Notes (Signed)
Pre visit review using our clinic review tool, if applicable. No additional management support is needed unless otherwise documented below in the visit note. 

## 2015-05-19 NOTE — Progress Notes (Signed)
I have reviewed and agree with the plan. 

## 2015-05-24 ENCOUNTER — Encounter (HOSPITAL_BASED_OUTPATIENT_CLINIC_OR_DEPARTMENT_OTHER): Payer: Medicare HMO | Attending: Plastic Surgery

## 2015-05-24 DIAGNOSIS — L89622 Pressure ulcer of left heel, stage 2: Secondary | ICD-10-CM | POA: Insufficient documentation

## 2015-05-24 DIAGNOSIS — L89892 Pressure ulcer of other site, stage 2: Secondary | ICD-10-CM | POA: Insufficient documentation

## 2015-05-24 DIAGNOSIS — L89323 Pressure ulcer of left buttock, stage 3: Secondary | ICD-10-CM | POA: Insufficient documentation

## 2015-05-25 NOTE — Telephone Encounter (Signed)
Patient would like to talk to you the sooner the better, she stated, she didn't tell me what about.
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
## 2015-05-26 ENCOUNTER — Telehealth: Payer: Self-pay | Admitting: Internal Medicine

## 2015-05-26 NOTE — Telephone Encounter (Signed)
Pt called and wanted to inform that her ins is not covering the cath supplies.   Spoke to [place], they are requiring a cpt code to give approval for medical catheter supplies.   Informed pt. Pt is wanting to receive supplies from [employer]. Pt is finding out if she can still get her cath supplies from them.

## 2015-05-26 NOTE — Telephone Encounter (Signed)
Patient would like to know if you have gotten a response from [facility] if she is in network. Please advise

## 2015-05-27 ENCOUNTER — Telehealth: Payer: Self-pay | Admitting: Internal Medicine

## 2015-05-27 ENCOUNTER — Ambulatory Visit (INDEPENDENT_AMBULATORY_CARE_PROVIDER_SITE_OTHER): Payer: Medicare HMO | Admitting: General Practice

## 2015-05-27 DIAGNOSIS — I82409 Acute embolism and thrombosis of unspecified deep veins of unspecified lower extremity: Secondary | ICD-10-CM

## 2015-05-27 DIAGNOSIS — Z5181 Encounter for therapeutic drug level monitoring: Secondary | ICD-10-CM

## 2015-05-27 LAB — POCT INR: INR: 2.1

## 2015-05-27 NOTE — Progress Notes (Signed)
Pre visit review using our clinic review tool, if applicable. No additional management support is needed unless otherwise documented below in the visit note. 

## 2015-05-27 NOTE — Telephone Encounter (Signed)
Patient wanted to let you know that on W[place] her  I & R was 2.1

## 2015-05-31 ENCOUNTER — Other Ambulatory Visit: Payer: Self-pay | Admitting: Internal Medicine

## 2015-05-31 ENCOUNTER — Other Ambulatory Visit: Payer: Self-pay

## 2015-05-31 DIAGNOSIS — L89892 Pressure ulcer of other site, stage 2: Secondary | ICD-10-CM | POA: Diagnosis not present

## 2015-05-31 DIAGNOSIS — L89622 Pressure ulcer of left heel, stage 2: Secondary | ICD-10-CM | POA: Diagnosis not present

## 2015-05-31 DIAGNOSIS — L89323 Pressure ulcer of left buttock, stage 3: Secondary | ICD-10-CM | POA: Diagnosis present

## 2015-06-03 ENCOUNTER — Encounter: Payer: Self-pay | Admitting: Internal Medicine

## 2015-06-03 ENCOUNTER — Telehealth: Payer: Self-pay | Admitting: Internal Medicine

## 2015-06-03 NOTE — Telephone Encounter (Signed)
Patient has sent you a fax that will be from Dr. Jeannene Patella [facility] for some orders that [name] will need from her primary care. [name] can't do anything that is ordered through her urologist.  Also, she wanted to let you know that her insurance will be changing to [place] starting tomorrow. She will call us with the information when she gets the cards.

## 2015-06-04 DIAGNOSIS — Z4800 Encounter for change or removal of nonsurgical wound dressing: Secondary | ICD-10-CM | POA: Diagnosis not present

## 2015-06-04 DIAGNOSIS — G8250 Quadriplegia, unspecified: Secondary | ICD-10-CM | POA: Diagnosis not present

## 2015-06-04 DIAGNOSIS — L89892 Pressure ulcer of other site, stage 2: Secondary | ICD-10-CM | POA: Diagnosis not present

## 2015-06-04 DIAGNOSIS — Z792 Long term (current) use of antibiotics: Secondary | ICD-10-CM | POA: Diagnosis not present

## 2015-06-04 DIAGNOSIS — Z7901 Long term (current) use of anticoagulants: Secondary | ICD-10-CM | POA: Diagnosis not present

## 2015-06-04 DIAGNOSIS — N319 Neuromuscular dysfunction of bladder, unspecified: Secondary | ICD-10-CM | POA: Diagnosis not present

## 2015-06-04 DIAGNOSIS — Z466 Encounter for fitting and adjustment of urinary device: Secondary | ICD-10-CM | POA: Diagnosis not present

## 2015-06-04 DIAGNOSIS — Z993 Dependence on wheelchair: Secondary | ICD-10-CM | POA: Diagnosis not present

## 2015-06-04 DIAGNOSIS — L89154 Pressure ulcer of sacral region, stage 4: Secondary | ICD-10-CM | POA: Diagnosis not present

## 2015-06-09 DIAGNOSIS — N319 Neuromuscular dysfunction of bladder, unspecified: Secondary | ICD-10-CM | POA: Diagnosis not present

## 2015-06-09 DIAGNOSIS — G8250 Quadriplegia, unspecified: Secondary | ICD-10-CM | POA: Diagnosis not present

## 2015-06-09 DIAGNOSIS — L89892 Pressure ulcer of other site, stage 2: Secondary | ICD-10-CM | POA: Diagnosis not present

## 2015-06-09 DIAGNOSIS — Z466 Encounter for fitting and adjustment of urinary device: Secondary | ICD-10-CM | POA: Diagnosis not present

## 2015-06-09 DIAGNOSIS — Z792 Long term (current) use of antibiotics: Secondary | ICD-10-CM | POA: Diagnosis not present

## 2015-06-09 DIAGNOSIS — Z993 Dependence on wheelchair: Secondary | ICD-10-CM | POA: Diagnosis not present

## 2015-06-09 DIAGNOSIS — Z7901 Long term (current) use of anticoagulants: Secondary | ICD-10-CM | POA: Diagnosis not present

## 2015-06-09 DIAGNOSIS — Z4800 Encounter for change or removal of nonsurgical wound dressing: Secondary | ICD-10-CM | POA: Diagnosis not present

## 2015-06-09 DIAGNOSIS — L89154 Pressure ulcer of sacral region, stage 4: Secondary | ICD-10-CM | POA: Diagnosis not present

## 2015-06-09 NOTE — Telephone Encounter (Signed)
Spoke to Abbott with [facility]: gave verbal orders for the following:   Change catheter every 1st and 3rd Monday of each month using sterile technique, 20 fr, 64ml 100% silcone foley catheter. Insert catheter 3 1/2 inches. Clean area with Saline. Use Triple antibiotic ointment around the area. After using skin prep, put drain sponge around catheter. Use 2 inch microspore tape around drain sponge. Secure catheter down with 1 inch transpore tape.  Informed that pt may do the bladder rinse done.

## 2015-06-09 NOTE — Telephone Encounter (Signed)
May have to consider HH to do bladder rinse if needed and an available service.

## 2015-06-10 ENCOUNTER — Telehealth: Payer: Self-pay

## 2015-06-10 NOTE — Telephone Encounter (Signed)
Called Pt to see when last mammogram was, order was placed 03/24/15. No results in EPIC, pt states never went due to multiple pressure sores and will call to reschedule mammogram once they have healed. She states she informed PCP at her last OV. I offered to place an order for a mammogram, pt denies at this time.

## 2015-06-10 NOTE — Telephone Encounter (Signed)
This written order was faxed to [facility] on 06/10/15

## 2015-06-11 DIAGNOSIS — Z466 Encounter for fitting and adjustment of urinary device: Secondary | ICD-10-CM | POA: Diagnosis not present

## 2015-06-11 DIAGNOSIS — Z7901 Long term (current) use of anticoagulants: Secondary | ICD-10-CM | POA: Diagnosis not present

## 2015-06-11 DIAGNOSIS — L89154 Pressure ulcer of sacral region, stage 4: Secondary | ICD-10-CM | POA: Diagnosis not present

## 2015-06-11 DIAGNOSIS — G8250 Quadriplegia, unspecified: Secondary | ICD-10-CM | POA: Diagnosis not present

## 2015-06-11 DIAGNOSIS — L89892 Pressure ulcer of other site, stage 2: Secondary | ICD-10-CM | POA: Diagnosis not present

## 2015-06-11 DIAGNOSIS — N319 Neuromuscular dysfunction of bladder, unspecified: Secondary | ICD-10-CM | POA: Diagnosis not present

## 2015-06-11 DIAGNOSIS — Z993 Dependence on wheelchair: Secondary | ICD-10-CM | POA: Diagnosis not present

## 2015-06-11 DIAGNOSIS — Z792 Long term (current) use of antibiotics: Secondary | ICD-10-CM | POA: Diagnosis not present

## 2015-06-11 DIAGNOSIS — Z4800 Encounter for change or removal of nonsurgical wound dressing: Secondary | ICD-10-CM | POA: Diagnosis not present

## 2015-06-14 DIAGNOSIS — Z7901 Long term (current) use of anticoagulants: Secondary | ICD-10-CM | POA: Diagnosis not present

## 2015-06-14 DIAGNOSIS — Z466 Encounter for fitting and adjustment of urinary device: Secondary | ICD-10-CM | POA: Diagnosis not present

## 2015-06-14 DIAGNOSIS — L89154 Pressure ulcer of sacral region, stage 4: Secondary | ICD-10-CM | POA: Diagnosis not present

## 2015-06-14 DIAGNOSIS — G8250 Quadriplegia, unspecified: Secondary | ICD-10-CM | POA: Diagnosis not present

## 2015-06-14 DIAGNOSIS — Z792 Long term (current) use of antibiotics: Secondary | ICD-10-CM | POA: Diagnosis not present

## 2015-06-14 DIAGNOSIS — Z993 Dependence on wheelchair: Secondary | ICD-10-CM | POA: Diagnosis not present

## 2015-06-14 DIAGNOSIS — Z4800 Encounter for change or removal of nonsurgical wound dressing: Secondary | ICD-10-CM | POA: Diagnosis not present

## 2015-06-14 DIAGNOSIS — N319 Neuromuscular dysfunction of bladder, unspecified: Secondary | ICD-10-CM | POA: Diagnosis not present

## 2015-06-14 DIAGNOSIS — L89892 Pressure ulcer of other site, stage 2: Secondary | ICD-10-CM | POA: Diagnosis not present

## 2015-06-16 DIAGNOSIS — Z993 Dependence on wheelchair: Secondary | ICD-10-CM | POA: Diagnosis not present

## 2015-06-16 DIAGNOSIS — G8250 Quadriplegia, unspecified: Secondary | ICD-10-CM | POA: Diagnosis not present

## 2015-06-16 DIAGNOSIS — Z466 Encounter for fitting and adjustment of urinary device: Secondary | ICD-10-CM | POA: Diagnosis not present

## 2015-06-16 DIAGNOSIS — L89892 Pressure ulcer of other site, stage 2: Secondary | ICD-10-CM | POA: Diagnosis not present

## 2015-06-16 DIAGNOSIS — Z792 Long term (current) use of antibiotics: Secondary | ICD-10-CM | POA: Diagnosis not present

## 2015-06-16 DIAGNOSIS — N319 Neuromuscular dysfunction of bladder, unspecified: Secondary | ICD-10-CM | POA: Diagnosis not present

## 2015-06-16 DIAGNOSIS — L89154 Pressure ulcer of sacral region, stage 4: Secondary | ICD-10-CM | POA: Diagnosis not present

## 2015-06-16 DIAGNOSIS — Z7901 Long term (current) use of anticoagulants: Secondary | ICD-10-CM | POA: Diagnosis not present

## 2015-06-16 DIAGNOSIS — Z4800 Encounter for change or removal of nonsurgical wound dressing: Secondary | ICD-10-CM | POA: Diagnosis not present

## 2015-06-17 DIAGNOSIS — L89224 Pressure ulcer of left hip, stage 4: Secondary | ICD-10-CM | POA: Diagnosis not present

## 2015-06-18 DIAGNOSIS — Z993 Dependence on wheelchair: Secondary | ICD-10-CM | POA: Diagnosis not present

## 2015-06-18 DIAGNOSIS — Z792 Long term (current) use of antibiotics: Secondary | ICD-10-CM | POA: Diagnosis not present

## 2015-06-18 DIAGNOSIS — Z466 Encounter for fitting and adjustment of urinary device: Secondary | ICD-10-CM | POA: Diagnosis not present

## 2015-06-18 DIAGNOSIS — Z4800 Encounter for change or removal of nonsurgical wound dressing: Secondary | ICD-10-CM | POA: Diagnosis not present

## 2015-06-18 DIAGNOSIS — L89892 Pressure ulcer of other site, stage 2: Secondary | ICD-10-CM | POA: Diagnosis not present

## 2015-06-18 DIAGNOSIS — L89154 Pressure ulcer of sacral region, stage 4: Secondary | ICD-10-CM | POA: Diagnosis not present

## 2015-06-18 DIAGNOSIS — G8250 Quadriplegia, unspecified: Secondary | ICD-10-CM | POA: Diagnosis not present

## 2015-06-18 DIAGNOSIS — Z7901 Long term (current) use of anticoagulants: Secondary | ICD-10-CM | POA: Diagnosis not present

## 2015-06-18 DIAGNOSIS — N319 Neuromuscular dysfunction of bladder, unspecified: Secondary | ICD-10-CM | POA: Diagnosis not present

## 2015-06-21 ENCOUNTER — Telehealth: Payer: Self-pay | Admitting: Internal Medicine

## 2015-06-21 DIAGNOSIS — L89154 Pressure ulcer of sacral region, stage 4: Secondary | ICD-10-CM | POA: Diagnosis not present

## 2015-06-21 DIAGNOSIS — N319 Neuromuscular dysfunction of bladder, unspecified: Secondary | ICD-10-CM | POA: Diagnosis not present

## 2015-06-21 DIAGNOSIS — G8250 Quadriplegia, unspecified: Secondary | ICD-10-CM | POA: Diagnosis not present

## 2015-06-21 DIAGNOSIS — Z993 Dependence on wheelchair: Secondary | ICD-10-CM | POA: Diagnosis not present

## 2015-06-21 DIAGNOSIS — L89892 Pressure ulcer of other site, stage 2: Secondary | ICD-10-CM | POA: Diagnosis not present

## 2015-06-21 DIAGNOSIS — Z466 Encounter for fitting and adjustment of urinary device: Secondary | ICD-10-CM | POA: Diagnosis not present

## 2015-06-21 DIAGNOSIS — Z792 Long term (current) use of antibiotics: Secondary | ICD-10-CM | POA: Diagnosis not present

## 2015-06-21 DIAGNOSIS — Z7901 Long term (current) use of anticoagulants: Secondary | ICD-10-CM | POA: Diagnosis not present

## 2015-06-21 DIAGNOSIS — Z4800 Encounter for change or removal of nonsurgical wound dressing: Secondary | ICD-10-CM | POA: Diagnosis not present

## 2015-06-21 NOTE — Telephone Encounter (Signed)
Cara from [place] called and patient's catheter change is 1st and 3rd Monday since her care giver that does the bladder irrigation has not came yet, patient requested the catheter change on Wednesday.

## 2015-06-22 ENCOUNTER — Ambulatory Visit (INDEPENDENT_AMBULATORY_CARE_PROVIDER_SITE_OTHER): Payer: Medicare HMO | Admitting: General Practice

## 2015-06-22 DIAGNOSIS — Z5181 Encounter for therapeutic drug level monitoring: Secondary | ICD-10-CM

## 2015-06-22 DIAGNOSIS — I82409 Acute embolism and thrombosis of unspecified deep veins of unspecified lower extremity: Secondary | ICD-10-CM

## 2015-06-22 LAB — POCT INR: INR: 1.8

## 2015-06-22 NOTE — Progress Notes (Signed)
Pre visit review using our clinic review tool, if applicable. No additional management support is needed unless otherwise documented below in the visit note. 

## 2015-06-22 NOTE — Progress Notes (Signed)
I have reviewed and agree with the plan. 

## 2015-06-23 DIAGNOSIS — Z4800 Encounter for change or removal of nonsurgical wound dressing: Secondary | ICD-10-CM | POA: Diagnosis not present

## 2015-06-23 DIAGNOSIS — Z466 Encounter for fitting and adjustment of urinary device: Secondary | ICD-10-CM | POA: Diagnosis not present

## 2015-06-23 DIAGNOSIS — L89892 Pressure ulcer of other site, stage 2: Secondary | ICD-10-CM | POA: Diagnosis not present

## 2015-06-23 DIAGNOSIS — Z7901 Long term (current) use of anticoagulants: Secondary | ICD-10-CM | POA: Diagnosis not present

## 2015-06-23 DIAGNOSIS — L89154 Pressure ulcer of sacral region, stage 4: Secondary | ICD-10-CM | POA: Diagnosis not present

## 2015-06-23 DIAGNOSIS — Z993 Dependence on wheelchair: Secondary | ICD-10-CM | POA: Diagnosis not present

## 2015-06-23 DIAGNOSIS — N319 Neuromuscular dysfunction of bladder, unspecified: Secondary | ICD-10-CM | POA: Diagnosis not present

## 2015-06-23 DIAGNOSIS — Z792 Long term (current) use of antibiotics: Secondary | ICD-10-CM | POA: Diagnosis not present

## 2015-06-23 DIAGNOSIS — G8250 Quadriplegia, unspecified: Secondary | ICD-10-CM | POA: Diagnosis not present

## 2015-06-25 DIAGNOSIS — L89892 Pressure ulcer of other site, stage 2: Secondary | ICD-10-CM | POA: Diagnosis not present

## 2015-06-25 DIAGNOSIS — N319 Neuromuscular dysfunction of bladder, unspecified: Secondary | ICD-10-CM | POA: Diagnosis not present

## 2015-06-25 DIAGNOSIS — Z7901 Long term (current) use of anticoagulants: Secondary | ICD-10-CM | POA: Diagnosis not present

## 2015-06-25 DIAGNOSIS — Z466 Encounter for fitting and adjustment of urinary device: Secondary | ICD-10-CM | POA: Diagnosis not present

## 2015-06-25 DIAGNOSIS — Z4800 Encounter for change or removal of nonsurgical wound dressing: Secondary | ICD-10-CM | POA: Diagnosis not present

## 2015-06-25 DIAGNOSIS — Z792 Long term (current) use of antibiotics: Secondary | ICD-10-CM | POA: Diagnosis not present

## 2015-06-25 DIAGNOSIS — L89154 Pressure ulcer of sacral region, stage 4: Secondary | ICD-10-CM | POA: Diagnosis not present

## 2015-06-25 DIAGNOSIS — Z993 Dependence on wheelchair: Secondary | ICD-10-CM | POA: Diagnosis not present

## 2015-06-25 DIAGNOSIS — G8250 Quadriplegia, unspecified: Secondary | ICD-10-CM | POA: Diagnosis not present

## 2015-06-28 ENCOUNTER — Encounter (HOSPITAL_BASED_OUTPATIENT_CLINIC_OR_DEPARTMENT_OTHER): Payer: Medicare Other | Attending: Plastic Surgery

## 2015-06-28 DIAGNOSIS — L89892 Pressure ulcer of other site, stage 2: Secondary | ICD-10-CM | POA: Insufficient documentation

## 2015-06-28 DIAGNOSIS — L89324 Pressure ulcer of left buttock, stage 4: Secondary | ICD-10-CM | POA: Diagnosis not present

## 2015-06-30 DIAGNOSIS — Z4800 Encounter for change or removal of nonsurgical wound dressing: Secondary | ICD-10-CM | POA: Diagnosis not present

## 2015-06-30 DIAGNOSIS — G8250 Quadriplegia, unspecified: Secondary | ICD-10-CM | POA: Diagnosis not present

## 2015-06-30 DIAGNOSIS — L89154 Pressure ulcer of sacral region, stage 4: Secondary | ICD-10-CM | POA: Diagnosis not present

## 2015-06-30 DIAGNOSIS — N319 Neuromuscular dysfunction of bladder, unspecified: Secondary | ICD-10-CM | POA: Diagnosis not present

## 2015-06-30 DIAGNOSIS — Z466 Encounter for fitting and adjustment of urinary device: Secondary | ICD-10-CM | POA: Diagnosis not present

## 2015-06-30 DIAGNOSIS — Z993 Dependence on wheelchair: Secondary | ICD-10-CM | POA: Diagnosis not present

## 2015-06-30 DIAGNOSIS — Z7901 Long term (current) use of anticoagulants: Secondary | ICD-10-CM | POA: Diagnosis not present

## 2015-06-30 DIAGNOSIS — Z792 Long term (current) use of antibiotics: Secondary | ICD-10-CM | POA: Diagnosis not present

## 2015-06-30 DIAGNOSIS — L89892 Pressure ulcer of other site, stage 2: Secondary | ICD-10-CM | POA: Diagnosis not present

## 2015-07-02 DIAGNOSIS — Z792 Long term (current) use of antibiotics: Secondary | ICD-10-CM | POA: Diagnosis not present

## 2015-07-02 DIAGNOSIS — L89154 Pressure ulcer of sacral region, stage 4: Secondary | ICD-10-CM | POA: Diagnosis not present

## 2015-07-02 DIAGNOSIS — Z7901 Long term (current) use of anticoagulants: Secondary | ICD-10-CM | POA: Diagnosis not present

## 2015-07-02 DIAGNOSIS — N319 Neuromuscular dysfunction of bladder, unspecified: Secondary | ICD-10-CM | POA: Diagnosis not present

## 2015-07-02 DIAGNOSIS — G8250 Quadriplegia, unspecified: Secondary | ICD-10-CM | POA: Diagnosis not present

## 2015-07-02 DIAGNOSIS — L89892 Pressure ulcer of other site, stage 2: Secondary | ICD-10-CM | POA: Diagnosis not present

## 2015-07-02 DIAGNOSIS — Z993 Dependence on wheelchair: Secondary | ICD-10-CM | POA: Diagnosis not present

## 2015-07-02 DIAGNOSIS — Z466 Encounter for fitting and adjustment of urinary device: Secondary | ICD-10-CM | POA: Diagnosis not present

## 2015-07-02 DIAGNOSIS — Z4800 Encounter for change or removal of nonsurgical wound dressing: Secondary | ICD-10-CM | POA: Diagnosis not present

## 2015-07-05 DIAGNOSIS — G8250 Quadriplegia, unspecified: Secondary | ICD-10-CM | POA: Diagnosis not present

## 2015-07-05 DIAGNOSIS — N319 Neuromuscular dysfunction of bladder, unspecified: Secondary | ICD-10-CM | POA: Diagnosis not present

## 2015-07-05 DIAGNOSIS — Z466 Encounter for fitting and adjustment of urinary device: Secondary | ICD-10-CM | POA: Diagnosis not present

## 2015-07-05 DIAGNOSIS — Z792 Long term (current) use of antibiotics: Secondary | ICD-10-CM | POA: Diagnosis not present

## 2015-07-05 DIAGNOSIS — Z4800 Encounter for change or removal of nonsurgical wound dressing: Secondary | ICD-10-CM | POA: Diagnosis not present

## 2015-07-05 DIAGNOSIS — L89154 Pressure ulcer of sacral region, stage 4: Secondary | ICD-10-CM | POA: Diagnosis not present

## 2015-07-05 DIAGNOSIS — L89892 Pressure ulcer of other site, stage 2: Secondary | ICD-10-CM | POA: Diagnosis not present

## 2015-07-05 DIAGNOSIS — Z993 Dependence on wheelchair: Secondary | ICD-10-CM | POA: Diagnosis not present

## 2015-07-05 DIAGNOSIS — Z7901 Long term (current) use of anticoagulants: Secondary | ICD-10-CM | POA: Diagnosis not present

## 2015-07-06 ENCOUNTER — Ambulatory Visit (INDEPENDENT_AMBULATORY_CARE_PROVIDER_SITE_OTHER): Payer: Medicare Other | Admitting: General Practice

## 2015-07-06 DIAGNOSIS — Z5181 Encounter for therapeutic drug level monitoring: Secondary | ICD-10-CM

## 2015-07-06 LAB — POCT INR: INR: 3.1

## 2015-07-06 NOTE — Progress Notes (Signed)
Pre visit review using our clinic review tool, if applicable. No additional management support is needed unless otherwise documented below in the visit note. 

## 2015-07-06 NOTE — Progress Notes (Signed)
I have reviewed and agree with the plan. 

## 2015-07-07 ENCOUNTER — Telehealth: Payer: Self-pay

## 2015-07-07 DIAGNOSIS — L89892 Pressure ulcer of other site, stage 2: Secondary | ICD-10-CM | POA: Diagnosis not present

## 2015-07-07 DIAGNOSIS — Z4800 Encounter for change or removal of nonsurgical wound dressing: Secondary | ICD-10-CM | POA: Diagnosis not present

## 2015-07-07 DIAGNOSIS — N319 Neuromuscular dysfunction of bladder, unspecified: Secondary | ICD-10-CM | POA: Diagnosis not present

## 2015-07-07 DIAGNOSIS — Z466 Encounter for fitting and adjustment of urinary device: Secondary | ICD-10-CM | POA: Diagnosis not present

## 2015-07-07 DIAGNOSIS — N390 Urinary tract infection, site not specified: Secondary | ICD-10-CM | POA: Diagnosis not present

## 2015-07-07 DIAGNOSIS — G8250 Quadriplegia, unspecified: Secondary | ICD-10-CM | POA: Diagnosis not present

## 2015-07-07 DIAGNOSIS — Z792 Long term (current) use of antibiotics: Secondary | ICD-10-CM | POA: Diagnosis not present

## 2015-07-07 DIAGNOSIS — Z993 Dependence on wheelchair: Secondary | ICD-10-CM | POA: Diagnosis not present

## 2015-07-07 DIAGNOSIS — L89154 Pressure ulcer of sacral region, stage 4: Secondary | ICD-10-CM | POA: Diagnosis not present

## 2015-07-07 DIAGNOSIS — Z7901 Long term (current) use of anticoagulants: Secondary | ICD-10-CM | POA: Diagnosis not present

## 2015-07-07 MED ORDER — NYSTATIN 100000 UNIT/GM EX POWD: Freq: Three times a day (TID) | CUTANEOUS | Status: AC | PRN

## 2015-07-07 NOTE — Addendum Note (Signed)
Addended by: Mauricio Po D on: 07/07/2015 06:04 PM   Modules accepted: Orders

## 2015-07-07 NOTE — Telephone Encounter (Signed)
Medication sent to pharmacy  

## 2015-07-07 NOTE — Telephone Encounter (Signed)
Most of the below message is FYI: In [place]OLD is the question.  Cara with [place] called [phone] [place] office).  Wanted to inform that pt has a sitter: Levada Dy Freight forwarder with [employer]) Supplies from [facility] have been ordered but have not arrived yet.   Possible UTI:  Cath change on Monday. Urine was cloudy.  Changed cath today (Wednesday) and urine is very dark and odorous.  [place] has completed a UA: came back with 2+WBC, positive nitrites. Oval Linsey is completing the C&S. We should have the result by Friday (07/09/15).   Wound Posterior Sacral:  Yeast and redness look around the wound. The wound itself is beginning to close and has a thin layer of epithelial cells. They are still using a dressing to cover due to cells on the area are too thin to leave uncovered. Using triple antibiotic ointment over the area. Request for a nystatin powder type med for the area to prevent irritation or reopening of the new skin.

## 2015-07-09 ENCOUNTER — Telehealth: Payer: Self-pay

## 2015-07-09 ENCOUNTER — Other Ambulatory Visit: Payer: Self-pay | Admitting: Internal Medicine

## 2015-07-09 DIAGNOSIS — Z4800 Encounter for change or removal of nonsurgical wound dressing: Secondary | ICD-10-CM | POA: Diagnosis not present

## 2015-07-09 DIAGNOSIS — L89892 Pressure ulcer of other site, stage 2: Secondary | ICD-10-CM | POA: Diagnosis not present

## 2015-07-09 DIAGNOSIS — Z792 Long term (current) use of antibiotics: Secondary | ICD-10-CM | POA: Diagnosis not present

## 2015-07-09 DIAGNOSIS — Z993 Dependence on wheelchair: Secondary | ICD-10-CM | POA: Diagnosis not present

## 2015-07-09 DIAGNOSIS — N319 Neuromuscular dysfunction of bladder, unspecified: Secondary | ICD-10-CM | POA: Diagnosis not present

## 2015-07-09 DIAGNOSIS — G8250 Quadriplegia, unspecified: Secondary | ICD-10-CM | POA: Diagnosis not present

## 2015-07-09 DIAGNOSIS — Z466 Encounter for fitting and adjustment of urinary device: Secondary | ICD-10-CM | POA: Diagnosis not present

## 2015-07-09 DIAGNOSIS — L89154 Pressure ulcer of sacral region, stage 4: Secondary | ICD-10-CM | POA: Diagnosis not present

## 2015-07-09 DIAGNOSIS — Z7901 Long term (current) use of anticoagulants: Secondary | ICD-10-CM | POA: Diagnosis not present

## 2015-07-09 MED ORDER — CIPROFLOXACIN HCL 250 MG PO TABS: 250.0000 mg | ORAL_TABLET | Freq: Two times a day (BID) | ORAL | Status: DC

## 2015-07-09 MED ORDER — OLOPATADINE HCL 0.1 % OP SOLN: 1.0000 [drp] | Freq: Two times a day (BID) | OPHTHALMIC | Status: DC | PRN

## 2015-07-09 NOTE — Addendum Note (Signed)
Addended by: Lowella Dandy on: 07/09/2015 11:33 AM   Modules accepted: Orders

## 2015-07-09 NOTE — Telephone Encounter (Signed)
Spoke to [place]. Pt wants to wait on the antibiotic.

## 2015-07-09 NOTE — Telephone Encounter (Signed)
Spoke to pt: requested hold on cipro, nystatin (pharmacy did not receive) and Patanol solution.   While on the phone with pt: went over Overdue Maintenance. Pt stated that she has an appointment with ophthalmology and a foot exam scheduled. Declined the mammogram and colonoscopy until the sores heal.   Question for PCP: would you like for [facility] to do the blood work for A1c, HIV, and microalbumin. Pt requesting a bmet (pt is interested in what it is now).    Spoke to pharmacy: phone in for the Patanol, held cipro until the final UCx is in and confirmed that they have the rx for the nystatin.

## 2015-07-09 NOTE — Telephone Encounter (Signed)
[name] ordered Cipro 250 bid x 3 days.

## 2015-07-09 NOTE — Telephone Encounter (Signed)
DM labs not needed as pt is NOT diabetic -  These suggestions were based on "hyperglycemia" on her Problem list from 01/2014 admission - and she is NOT diabetic - Therefore no a1c, urine micro, foot exam or optho needed HIV already done and negative 10/02/13 (see lab results) - therefore no need to repeat I do no see why Bmet needed clinically - so no labs ordered by me Thanks

## 2015-07-09 NOTE — Telephone Encounter (Signed)
Culture:   Proteus Mirbilis is the organism >100,000    Unidentified organism [id number]

## 2015-07-09 NOTE — Telephone Encounter (Signed)
Can you please call Janett Billow from [place] back.  Her [phone]

## 2015-07-09 NOTE — Telephone Encounter (Signed)
Arville Go RN Moshe Salisbury) called.   Nystatin powder has not arrived to the pt home yet. Yeast is spread outside of the dressing.  Remove hold for Cipro (done). Enterobacter is indicated per RN who spoke to hospital lab.  RN stated that the pt is okay to go ahead and start the abx.

## 2015-07-09 NOTE — Telephone Encounter (Signed)
[name] from [facility] contacted [place] as well as the pt regarding culture results and rx sent to pharmacy.

## 2015-07-12 ENCOUNTER — Encounter: Payer: Self-pay | Admitting: Internal Medicine

## 2015-07-12 ENCOUNTER — Telehealth: Payer: Self-pay | Admitting: *Deleted

## 2015-07-12 DIAGNOSIS — N319 Neuromuscular dysfunction of bladder, unspecified: Secondary | ICD-10-CM | POA: Diagnosis not present

## 2015-07-12 DIAGNOSIS — Z792 Long term (current) use of antibiotics: Secondary | ICD-10-CM | POA: Diagnosis not present

## 2015-07-12 DIAGNOSIS — G8250 Quadriplegia, unspecified: Secondary | ICD-10-CM | POA: Diagnosis not present

## 2015-07-12 DIAGNOSIS — Z993 Dependence on wheelchair: Secondary | ICD-10-CM | POA: Diagnosis not present

## 2015-07-12 DIAGNOSIS — L89154 Pressure ulcer of sacral region, stage 4: Secondary | ICD-10-CM | POA: Diagnosis not present

## 2015-07-12 DIAGNOSIS — Z7901 Long term (current) use of anticoagulants: Secondary | ICD-10-CM | POA: Diagnosis not present

## 2015-07-12 DIAGNOSIS — Z466 Encounter for fitting and adjustment of urinary device: Secondary | ICD-10-CM | POA: Diagnosis not present

## 2015-07-12 DIAGNOSIS — L89892 Pressure ulcer of other site, stage 2: Secondary | ICD-10-CM | POA: Diagnosis not present

## 2015-07-12 DIAGNOSIS — Z4800 Encounter for change or removal of nonsurgical wound dressing: Secondary | ICD-10-CM | POA: Diagnosis not present

## 2015-07-12 LAB — URINALYSIS

## 2015-07-12 NOTE — Telephone Encounter (Signed)
Spoke to pt. Informed pt that I have received the cx results. UTI was caused by Enterococcus Faecalis and Proteus Mirabilis

## 2015-07-12 NOTE — Telephone Encounter (Signed)
Per Terri Piedra:   Gave verbal order to [facility] to recheck urine if symptomatic.

## 2015-07-12 NOTE — Telephone Encounter (Signed)
No further treatment should be necessary as the bacteria was susceptible to the antibiotic per the culture.

## 2015-07-12 NOTE — Telephone Encounter (Signed)
Left msg on triage requesting call bck from nurse. Wanting to know did md received urine culture results that was sent on Sat...[name]

## 2015-07-12 NOTE — Telephone Encounter (Signed)
Pt informed

## 2015-07-14 DIAGNOSIS — Z993 Dependence on wheelchair: Secondary | ICD-10-CM | POA: Diagnosis not present

## 2015-07-14 DIAGNOSIS — L89892 Pressure ulcer of other site, stage 2: Secondary | ICD-10-CM | POA: Diagnosis not present

## 2015-07-14 DIAGNOSIS — Z7901 Long term (current) use of anticoagulants: Secondary | ICD-10-CM | POA: Diagnosis not present

## 2015-07-14 DIAGNOSIS — L89154 Pressure ulcer of sacral region, stage 4: Secondary | ICD-10-CM | POA: Diagnosis not present

## 2015-07-14 DIAGNOSIS — Z4800 Encounter for change or removal of nonsurgical wound dressing: Secondary | ICD-10-CM | POA: Diagnosis not present

## 2015-07-14 DIAGNOSIS — Z792 Long term (current) use of antibiotics: Secondary | ICD-10-CM | POA: Diagnosis not present

## 2015-07-14 DIAGNOSIS — N319 Neuromuscular dysfunction of bladder, unspecified: Secondary | ICD-10-CM | POA: Diagnosis not present

## 2015-07-14 DIAGNOSIS — Z466 Encounter for fitting and adjustment of urinary device: Secondary | ICD-10-CM | POA: Diagnosis not present

## 2015-07-14 DIAGNOSIS — G8250 Quadriplegia, unspecified: Secondary | ICD-10-CM | POA: Diagnosis not present

## 2015-07-16 ENCOUNTER — Other Ambulatory Visit: Payer: Self-pay | Admitting: Internal Medicine

## 2015-07-16 DIAGNOSIS — L89154 Pressure ulcer of sacral region, stage 4: Secondary | ICD-10-CM | POA: Diagnosis not present

## 2015-07-16 DIAGNOSIS — Z7901 Long term (current) use of anticoagulants: Secondary | ICD-10-CM | POA: Diagnosis not present

## 2015-07-16 DIAGNOSIS — G8250 Quadriplegia, unspecified: Secondary | ICD-10-CM | POA: Diagnosis not present

## 2015-07-16 DIAGNOSIS — L89892 Pressure ulcer of other site, stage 2: Secondary | ICD-10-CM | POA: Diagnosis not present

## 2015-07-16 DIAGNOSIS — N319 Neuromuscular dysfunction of bladder, unspecified: Secondary | ICD-10-CM | POA: Diagnosis not present

## 2015-07-16 DIAGNOSIS — Z4800 Encounter for change or removal of nonsurgical wound dressing: Secondary | ICD-10-CM | POA: Diagnosis not present

## 2015-07-16 DIAGNOSIS — Z466 Encounter for fitting and adjustment of urinary device: Secondary | ICD-10-CM | POA: Diagnosis not present

## 2015-07-16 DIAGNOSIS — Z993 Dependence on wheelchair: Secondary | ICD-10-CM | POA: Diagnosis not present

## 2015-07-16 DIAGNOSIS — Z792 Long term (current) use of antibiotics: Secondary | ICD-10-CM | POA: Diagnosis not present

## 2015-07-18 DIAGNOSIS — L89224 Pressure ulcer of left hip, stage 4: Secondary | ICD-10-CM | POA: Diagnosis not present

## 2015-07-19 ENCOUNTER — Telehealth: Payer: Self-pay | Admitting: Internal Medicine

## 2015-07-19 ENCOUNTER — Ambulatory Visit (INDEPENDENT_AMBULATORY_CARE_PROVIDER_SITE_OTHER): Payer: Medicare Other | Admitting: General Practice

## 2015-07-19 DIAGNOSIS — Z466 Encounter for fitting and adjustment of urinary device: Secondary | ICD-10-CM | POA: Diagnosis not present

## 2015-07-19 DIAGNOSIS — Z993 Dependence on wheelchair: Secondary | ICD-10-CM | POA: Diagnosis not present

## 2015-07-19 DIAGNOSIS — G8250 Quadriplegia, unspecified: Secondary | ICD-10-CM | POA: Diagnosis not present

## 2015-07-19 DIAGNOSIS — Z5181 Encounter for therapeutic drug level monitoring: Secondary | ICD-10-CM

## 2015-07-19 DIAGNOSIS — L89154 Pressure ulcer of sacral region, stage 4: Secondary | ICD-10-CM | POA: Diagnosis not present

## 2015-07-19 DIAGNOSIS — Z7901 Long term (current) use of anticoagulants: Secondary | ICD-10-CM | POA: Diagnosis not present

## 2015-07-19 DIAGNOSIS — N390 Urinary tract infection, site not specified: Secondary | ICD-10-CM | POA: Diagnosis not present

## 2015-07-19 DIAGNOSIS — Z4800 Encounter for change or removal of nonsurgical wound dressing: Secondary | ICD-10-CM | POA: Diagnosis not present

## 2015-07-19 DIAGNOSIS — Z792 Long term (current) use of antibiotics: Secondary | ICD-10-CM | POA: Diagnosis not present

## 2015-07-19 DIAGNOSIS — L89892 Pressure ulcer of other site, stage 2: Secondary | ICD-10-CM | POA: Diagnosis not present

## 2015-07-19 DIAGNOSIS — N319 Neuromuscular dysfunction of bladder, unspecified: Secondary | ICD-10-CM | POA: Diagnosis not present

## 2015-07-19 LAB — POCT INR: INR: 1.9

## 2015-07-19 NOTE — Telephone Encounter (Signed)
Is requesting call back in regards to urinalysis she gave patient this morning.  States urine is cloudy with odor.  Is faxing results over.  Is requesting order for another antibiotic.  Believes patient might have another UTI.

## 2015-07-19 NOTE — Telephone Encounter (Signed)
Called Cara:   Results:  Malodorous urine Cloudy urine   1+ protein 2+ WBC 3+ RBC  Micro showed:   10-15 WBC per window 3-8 RBC per window 1+ bacteria per window  Last rx last week Cipro 250 mg bid

## 2015-07-19 NOTE — Progress Notes (Signed)
Pre visit review using our clinic review tool, if applicable. No additional management support is needed unless otherwise documented below in the visit note. 

## 2015-07-20 MED ORDER — CIPROFLOXACIN HCL 250 MG PO TABS: 250.0000 mg | ORAL_TABLET | Freq: Two times a day (BID) | ORAL | Status: DC

## 2015-07-20 NOTE — Telephone Encounter (Signed)
Continue treatment with Cipro, extending course to additional 10 days. Prescription for 20 tablets sent to pharmacy

## 2015-07-21 ENCOUNTER — Telehealth: Payer: Self-pay | Admitting: Internal Medicine

## 2015-07-21 DIAGNOSIS — L89154 Pressure ulcer of sacral region, stage 4: Secondary | ICD-10-CM | POA: Diagnosis not present

## 2015-07-21 DIAGNOSIS — Z993 Dependence on wheelchair: Secondary | ICD-10-CM | POA: Diagnosis not present

## 2015-07-21 DIAGNOSIS — Z466 Encounter for fitting and adjustment of urinary device: Secondary | ICD-10-CM | POA: Diagnosis not present

## 2015-07-21 DIAGNOSIS — Z4800 Encounter for change or removal of nonsurgical wound dressing: Secondary | ICD-10-CM | POA: Diagnosis not present

## 2015-07-21 DIAGNOSIS — Z7901 Long term (current) use of anticoagulants: Secondary | ICD-10-CM | POA: Diagnosis not present

## 2015-07-21 DIAGNOSIS — G8250 Quadriplegia, unspecified: Secondary | ICD-10-CM | POA: Diagnosis not present

## 2015-07-21 DIAGNOSIS — Z792 Long term (current) use of antibiotics: Secondary | ICD-10-CM | POA: Diagnosis not present

## 2015-07-21 DIAGNOSIS — N319 Neuromuscular dysfunction of bladder, unspecified: Secondary | ICD-10-CM | POA: Diagnosis not present

## 2015-07-21 DIAGNOSIS — S81801A Unspecified open wound, right lower leg, initial encounter: Secondary | ICD-10-CM | POA: Diagnosis not present

## 2015-07-21 DIAGNOSIS — L89892 Pressure ulcer of other site, stage 2: Secondary | ICD-10-CM | POA: Diagnosis not present

## 2015-07-21 DIAGNOSIS — S31829A Unspecified open wound of left buttock, initial encounter: Secondary | ICD-10-CM | POA: Diagnosis not present

## 2015-07-21 NOTE — Telephone Encounter (Signed)
Janett Billow from [facility] called to report urine culture positive for MRSA Janett Billow can be reached at [phone]

## 2015-07-21 NOTE — Telephone Encounter (Signed)
Called [name] back and she stated that there is a detailed report about the culture and the sensitivities and they are being faxed to [place] now.

## 2015-07-22 ENCOUNTER — Telehealth: Payer: Self-pay | Admitting: Internal Medicine

## 2015-07-22 NOTE — Telephone Encounter (Signed)
Pt called to let you know you do not need to call pharmacy

## 2015-07-22 NOTE — Telephone Encounter (Signed)
Pt informed of MD response.  

## 2015-07-22 NOTE — Telephone Encounter (Signed)
Please see my written answer on faxed report (to be scanned into EHR) MRSA reporresents colonoization - no specific rx for same at this time Proteus sens to Cipro - continue 10 days added to prior 3days as rx'd by Bonney Roussel (already sent )---  No new changes based on these faxed results thanks

## 2015-07-22 NOTE — Telephone Encounter (Signed)
LVM for Janett Billow to call me back as soon as possible.

## 2015-07-23 ENCOUNTER — Telehealth: Payer: Self-pay | Admitting: Internal Medicine

## 2015-07-23 ENCOUNTER — Telehealth: Payer: Self-pay

## 2015-07-23 ENCOUNTER — Encounter: Payer: Self-pay | Admitting: Internal Medicine

## 2015-07-23 DIAGNOSIS — Z466 Encounter for fitting and adjustment of urinary device: Secondary | ICD-10-CM | POA: Diagnosis not present

## 2015-07-23 DIAGNOSIS — Z792 Long term (current) use of antibiotics: Secondary | ICD-10-CM | POA: Diagnosis not present

## 2015-07-23 DIAGNOSIS — N319 Neuromuscular dysfunction of bladder, unspecified: Secondary | ICD-10-CM | POA: Diagnosis not present

## 2015-07-23 DIAGNOSIS — Z7901 Long term (current) use of anticoagulants: Secondary | ICD-10-CM | POA: Diagnosis not present

## 2015-07-23 DIAGNOSIS — Z993 Dependence on wheelchair: Secondary | ICD-10-CM | POA: Diagnosis not present

## 2015-07-23 DIAGNOSIS — L89892 Pressure ulcer of other site, stage 2: Secondary | ICD-10-CM | POA: Diagnosis not present

## 2015-07-23 DIAGNOSIS — Z4800 Encounter for change or removal of nonsurgical wound dressing: Secondary | ICD-10-CM | POA: Diagnosis not present

## 2015-07-23 DIAGNOSIS — L89154 Pressure ulcer of sacral region, stage 4: Secondary | ICD-10-CM | POA: Diagnosis not present

## 2015-07-23 DIAGNOSIS — G8250 Quadriplegia, unspecified: Secondary | ICD-10-CM | POA: Diagnosis not present

## 2015-07-23 NOTE — Telephone Encounter (Signed)
Noted and agree thanks

## 2015-07-23 NOTE — Telephone Encounter (Signed)
Reviewed culture taken 07/19/15 which showed bacteria sensitive to the ciprofloxacin that was sent to the patient. She had fever yesterday concurrent with starting the antibiotic. Would not adjust therapy at this time for colonization with mrsa. Would not change to bactrim given concurrent warfarin therapy interaction. Advise to stay on ciprofloxacin and if more fevers or confusion or lethargy she needs to seek care in the ER. Otherwise please have her call back on Monday to update [place] on her condition.

## 2015-07-23 NOTE — Telephone Encounter (Signed)
[name] with [name] called and stated RN went out to see pt today. Pt is febrile with temp at 100 and leg wound that has developed and is warm and red.  Stated pt was not feeling well and has had 2 doses of the Cipro for recent UTI of p. maribilis and small colony of MRSA.  Per PCP, if pt develops a fever then advisement is to Walker to ED. Mikael Spray the verbal to send pt to ED.

## 2015-07-23 NOTE — Telephone Encounter (Signed)
Called the following number in an attempt to fulfil the pts request: [phone], [phone] and [phone].  None of the individuals that answered was able to confirm that they had spoke to the pt or that the pt had called.  Request advise from provider in the office regarding abx change.

## 2015-07-23 NOTE — Telephone Encounter (Signed)
Arnell Asal RN PCP response to treatment.

## 2015-07-23 NOTE — Telephone Encounter (Signed)
I called pt, she said that she will see the wound dr on Monday and if she feels that she gets worse she will go to the ER.

## 2015-07-23 NOTE — Telephone Encounter (Signed)
Pt called back in said that the correct number to go directly to Dr Rosana Hoes was [phone].  Explain to her that if the office needed additional info they could give [place] a call.  She said that they would not do that and that we need to call them.

## 2015-07-23 NOTE — Telephone Encounter (Signed)
Pt states that her fever has gone down and that she is wanting to have bactrim sent in to take instead of the cipro.  She stated that she would not go to the ED because it would cost her too much money.

## 2015-07-26 ENCOUNTER — Encounter (HOSPITAL_BASED_OUTPATIENT_CLINIC_OR_DEPARTMENT_OTHER): Payer: Medicare Other | Attending: Plastic Surgery

## 2015-07-26 ENCOUNTER — Encounter: Payer: Self-pay | Admitting: Family

## 2015-07-26 DIAGNOSIS — L89324 Pressure ulcer of left buttock, stage 4: Secondary | ICD-10-CM | POA: Insufficient documentation

## 2015-07-26 DIAGNOSIS — L89892 Pressure ulcer of other site, stage 2: Secondary | ICD-10-CM | POA: Diagnosis not present

## 2015-07-26 DIAGNOSIS — L02215 Cutaneous abscess of perineum: Secondary | ICD-10-CM | POA: Diagnosis not present

## 2015-07-27 ENCOUNTER — Telehealth: Payer: Self-pay

## 2015-07-27 ENCOUNTER — Ambulatory Visit (INDEPENDENT_AMBULATORY_CARE_PROVIDER_SITE_OTHER): Payer: Self-pay | Admitting: General Practice

## 2015-07-27 ENCOUNTER — Encounter: Payer: Self-pay | Admitting: Internal Medicine

## 2015-07-27 DIAGNOSIS — Z5181 Encounter for therapeutic drug level monitoring: Secondary | ICD-10-CM

## 2015-07-27 LAB — POCT INR: INR: 2.7

## 2015-07-27 NOTE — Telephone Encounter (Signed)
[name] called and stated that [name] has tried to call pt back has not been able to get in touch with her.  [phone] [facility] call back.   Was able to get in touch with pt. She will give [facility] a call back.   Called [facility] and stated that they have spoken to the pt.

## 2015-07-27 NOTE — Progress Notes (Signed)
I have reviewed and agree with the plan. 

## 2015-07-27 NOTE — Progress Notes (Signed)
Pre visit review using our clinic review tool, if applicable. No additional management support is needed unless otherwise documented below in the visit note. 

## 2015-07-28 ENCOUNTER — Telehealth: Payer: Self-pay | Admitting: Internal Medicine

## 2015-07-28 DIAGNOSIS — Z466 Encounter for fitting and adjustment of urinary device: Secondary | ICD-10-CM | POA: Diagnosis not present

## 2015-07-28 DIAGNOSIS — N319 Neuromuscular dysfunction of bladder, unspecified: Secondary | ICD-10-CM | POA: Diagnosis not present

## 2015-07-28 DIAGNOSIS — G8250 Quadriplegia, unspecified: Secondary | ICD-10-CM | POA: Diagnosis not present

## 2015-07-28 DIAGNOSIS — L89154 Pressure ulcer of sacral region, stage 4: Secondary | ICD-10-CM | POA: Diagnosis not present

## 2015-07-28 DIAGNOSIS — Z4800 Encounter for change or removal of nonsurgical wound dressing: Secondary | ICD-10-CM | POA: Diagnosis not present

## 2015-07-28 DIAGNOSIS — Z792 Long term (current) use of antibiotics: Secondary | ICD-10-CM | POA: Diagnosis not present

## 2015-07-28 DIAGNOSIS — Z7901 Long term (current) use of anticoagulants: Secondary | ICD-10-CM | POA: Diagnosis not present

## 2015-07-28 DIAGNOSIS — L89892 Pressure ulcer of other site, stage 2: Secondary | ICD-10-CM | POA: Diagnosis not present

## 2015-07-28 DIAGNOSIS — Z993 Dependence on wheelchair: Secondary | ICD-10-CM | POA: Diagnosis not present

## 2015-07-28 NOTE — Progress Notes (Signed)
I have reviewed and agree with the plan. 

## 2015-07-28 NOTE — Telephone Encounter (Signed)
FYI

## 2015-07-28 NOTE — Telephone Encounter (Signed)
Pt just wanted to let you know that she did talk with Dr. Rosana Hoes and he has changed her antibiotic to Bactrim for 7 days.

## 2015-07-29 ENCOUNTER — Other Ambulatory Visit: Payer: Self-pay | Admitting: Internal Medicine

## 2015-07-29 ENCOUNTER — Telehealth: Payer: Self-pay | Admitting: Internal Medicine

## 2015-07-29 NOTE — Telephone Encounter (Signed)
lvm for Cara to call back.

## 2015-07-29 NOTE — Telephone Encounter (Signed)
Please call kara from [place] at [phone] She wants to give you an update on her statis

## 2015-07-30 ENCOUNTER — Encounter (HOSPITAL_COMMUNITY): Payer: Self-pay | Admitting: Emergency Medicine

## 2015-07-30 ENCOUNTER — Emergency Department (HOSPITAL_COMMUNITY)
Admission: EM | Admit: 2015-07-30 | Discharge: 2015-07-31 | Disposition: A | Discharge status: Home / Self Care | Payer: Medicare Other | Attending: Emergency Medicine | Admitting: Emergency Medicine

## 2015-07-30 DIAGNOSIS — Z8669 Personal history of other diseases of the nervous system and sense organs: Secondary | ICD-10-CM | POA: Diagnosis not present

## 2015-07-30 DIAGNOSIS — E785 Hyperlipidemia, unspecified: Secondary | ICD-10-CM | POA: Insufficient documentation

## 2015-07-30 DIAGNOSIS — G8250 Quadriplegia, unspecified: Secondary | ICD-10-CM | POA: Diagnosis not present

## 2015-07-30 DIAGNOSIS — Z8781 Personal history of (healed) traumatic fracture: Secondary | ICD-10-CM | POA: Diagnosis not present

## 2015-07-30 DIAGNOSIS — Z792 Long term (current) use of antibiotics: Secondary | ICD-10-CM | POA: Diagnosis not present

## 2015-07-30 DIAGNOSIS — Z8619 Personal history of other infectious and parasitic diseases: Secondary | ICD-10-CM | POA: Diagnosis not present

## 2015-07-30 DIAGNOSIS — Z87891 Personal history of nicotine dependence: Secondary | ICD-10-CM | POA: Diagnosis not present

## 2015-07-30 DIAGNOSIS — L89154 Pressure ulcer of sacral region, stage 4: Secondary | ICD-10-CM | POA: Diagnosis not present

## 2015-07-30 DIAGNOSIS — Z79899 Other long term (current) drug therapy: Secondary | ICD-10-CM | POA: Diagnosis not present

## 2015-07-30 DIAGNOSIS — N319 Neuromuscular dysfunction of bladder, unspecified: Secondary | ICD-10-CM | POA: Diagnosis not present

## 2015-07-30 DIAGNOSIS — Z86718 Personal history of other venous thrombosis and embolism: Secondary | ICD-10-CM | POA: Diagnosis not present

## 2015-07-30 DIAGNOSIS — L0231 Cutaneous abscess of buttock: Secondary | ICD-10-CM | POA: Diagnosis not present

## 2015-07-30 DIAGNOSIS — E559 Vitamin D deficiency, unspecified: Secondary | ICD-10-CM | POA: Insufficient documentation

## 2015-07-30 DIAGNOSIS — Z466 Encounter for fitting and adjustment of urinary device: Secondary | ICD-10-CM | POA: Diagnosis not present

## 2015-07-30 DIAGNOSIS — Z993 Dependence on wheelchair: Secondary | ICD-10-CM | POA: Diagnosis not present

## 2015-07-30 DIAGNOSIS — Z7901 Long term (current) use of anticoagulants: Secondary | ICD-10-CM | POA: Insufficient documentation

## 2015-07-30 DIAGNOSIS — Z4800 Encounter for change or removal of nonsurgical wound dressing: Secondary | ICD-10-CM | POA: Diagnosis not present

## 2015-07-30 DIAGNOSIS — L89892 Pressure ulcer of other site, stage 2: Secondary | ICD-10-CM | POA: Diagnosis not present

## 2015-07-30 LAB — COMPREHENSIVE METABOLIC PANEL
ALBUMIN: 2.7 g/dL — AB (ref 3.5–5.0)
ALK PHOS: 93 U/L (ref 38–126)
ALT: 31 U/L (ref 14–54)
AST: 36 U/L (ref 15–41)
Anion gap: 9 (ref 5–15)
BUN: 10 mg/dL (ref 6–20)
CALCIUM: 9.3 mg/dL (ref 8.9–10.3)
CHLORIDE: 99 mmol/L — AB (ref 101–111)
CO2: 27 mmol/L (ref 22–32)
CREATININE: 0.55 mg/dL (ref 0.44–1.00)
GFR calc Af Amer: >60 mL/min (ref >60)
GFR calc non Af Amer: >60 mL/min (ref >60)
GLUCOSE: 105 mg/dL — AB (ref 65–99)
Potassium: 4 mmol/L (ref 3.5–5.1)
SODIUM: 135 mmol/L (ref 135–145)
Total Bilirubin: 0.1 mg/dL — ABNORMAL LOW (ref 0.3–1.2)
Total Protein: 7.6 g/dL (ref 6.5–8.1)

## 2015-07-30 LAB — CBC
HCT: 35.7 % — ABNORMAL LOW (ref 36.0–46.0)
Hemoglobin: 11.9 g/dL — ABNORMAL LOW (ref 12.0–15.0)
MCH: 30.8 pg (ref 26.0–34.0)
MCHC: 33.3 g/dL (ref 30.0–36.0)
MCV: 92.5 fL (ref 78.0–100.0)
PLATELETS: 239 10*3/uL (ref 150–400)
RBC: 3.86 MIL/uL — ABNORMAL LOW (ref 3.87–5.11)
RDW: 14.1 % (ref 11.5–15.5)
WBC: 7.4 10*3/uL (ref 4.0–10.5)

## 2015-07-30 NOTE — Telephone Encounter (Signed)
Spoke to [place]. Moshe Salisbury stated that pt went to the [facility]. Pt now has a absess on her left labia. [place] drained and packed the area. Moshe Salisbury states also that the area is hard and red. The packing has come out and she is concerned that the area is infected with MRSA due to recent UA cx that came back positive for MRSA. Advise Moshe Salisbury to be attentive for signs of sepsis. *Pt did refuse to go to the ER last week when instructed after we received the report that she was febrile. She instead called her Urology (Dr. Rosana Hoes) to rx a new abx (Bactrim) for the UTI.

## 2015-07-30 NOTE — Discharge Instructions (Signed)
Continue local wound care. Return if there are any problems.   Abscess An abscess is an infected area that contains a collection of pus and debris.It can occur in almost any part of the body. An abscess is also known as a furuncle or boil. CAUSES  An abscess occurs when tissue gets infected. This can occur from blockage of oil or sweat glands, infection of hair follicles, or a minor injury to the skin. As the body tries to fight the infection, pus collects in the area and creates pressure under the skin. This pressure causes pain. People with weakened immune systems have difficulty fighting infections and get certain abscesses more often.  SYMPTOMS Usually an abscess develops on the skin and becomes a painful mass that is red, warm, and tender. If the abscess forms under the skin, you may feel a moveable soft area under the skin. Some abscesses break open (rupture) on their own, but most will continue to get worse without care. The infection can spread deeper into the body and eventually into the bloodstream, causing you to feel ill.  DIAGNOSIS  Your caregiver will take your medical history and perform a physical exam. A sample of fluid may also be taken from the abscess to determine what is causing your infection. TREATMENT  Your caregiver may prescribe antibiotic medicines to fight the infection. However, taking antibiotics alone usually does not cure an abscess. Your caregiver may need to make a small cut (incision) in the abscess to drain the pus. In some cases, gauze is packed into the abscess to reduce pain and to continue draining the area. HOME CARE INSTRUCTIONS   Only take over-the-counter or prescription medicines for pain, discomfort, or fever as directed by your caregiver.  If you were prescribed antibiotics, take them as directed. Finish them even if you start to feel better.  If gauze is used, follow your caregiver's directions for changing the gauze.  To avoid spreading the  infection:  Keep your draining abscess covered with a bandage.  Wash your hands well.  Do not share personal care items, towels, or whirlpools with others.  Avoid skin contact with others.  Keep your skin and clothes clean around the abscess.  Keep all follow-up appointments as directed by your caregiver. SEEK MEDICAL CARE IF:   You have increased pain, swelling, redness, fluid drainage, or bleeding.  You have muscle aches, chills, or a general ill feeling.  You have a fever. MAKE SURE YOU:   Understand these instructions.  Will watch your condition.  Will get help right away if you are not doing well or get worse. Document Released: 08/30/2005 Document Revised: 05/21/2012 Document Reviewed: 02/02/2012 [facility] Patient Information 2015 [place], [place]. This information is not intended to replace advice given to you by your health care provider. Make sure you discuss any questions you have with your health care provider.

## 2015-07-30 NOTE — ED Notes (Signed)
Patient here with complaint of "boil on my bottom". Patient is paraplegic and wheelchair bound. Attends wound clinic, and abscess was located there. I+D performed several days ago. Recent exams haven't shown improvement and patient was advised to present for further evaluation.
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
## 2015-08-02 ENCOUNTER — Encounter: Payer: Self-pay | Admitting: Internal Medicine

## 2015-08-02 ENCOUNTER — Ambulatory Visit (INDEPENDENT_AMBULATORY_CARE_PROVIDER_SITE_OTHER): Payer: Medicare Other | Admitting: General Practice

## 2015-08-02 DIAGNOSIS — G8250 Quadriplegia, unspecified: Secondary | ICD-10-CM | POA: Diagnosis not present

## 2015-08-02 DIAGNOSIS — L89154 Pressure ulcer of sacral region, stage 4: Secondary | ICD-10-CM | POA: Diagnosis not present

## 2015-08-02 DIAGNOSIS — Z5181 Encounter for therapeutic drug level monitoring: Secondary | ICD-10-CM

## 2015-08-02 DIAGNOSIS — Z7901 Long term (current) use of anticoagulants: Secondary | ICD-10-CM | POA: Diagnosis not present

## 2015-08-02 DIAGNOSIS — L89892 Pressure ulcer of other site, stage 2: Secondary | ICD-10-CM | POA: Diagnosis not present

## 2015-08-02 DIAGNOSIS — Z993 Dependence on wheelchair: Secondary | ICD-10-CM | POA: Diagnosis not present

## 2015-08-02 DIAGNOSIS — Z792 Long term (current) use of antibiotics: Secondary | ICD-10-CM | POA: Diagnosis not present

## 2015-08-02 DIAGNOSIS — I82409 Acute embolism and thrombosis of unspecified deep veins of unspecified lower extremity: Secondary | ICD-10-CM

## 2015-08-02 DIAGNOSIS — Z466 Encounter for fitting and adjustment of urinary device: Secondary | ICD-10-CM | POA: Diagnosis not present

## 2015-08-02 DIAGNOSIS — Z4800 Encounter for change or removal of nonsurgical wound dressing: Secondary | ICD-10-CM | POA: Diagnosis not present

## 2015-08-02 DIAGNOSIS — N319 Neuromuscular dysfunction of bladder, unspecified: Secondary | ICD-10-CM | POA: Diagnosis not present

## 2015-08-02 LAB — POCT INR: INR: 3.3

## 2015-08-02 NOTE — Telephone Encounter (Signed)
Bactrim as per uro rx wold cover MRSA- Agree w/ wound care as ongoing - contine rx'd tx ED/hospital if fever or change in mental status -  thanks

## 2015-08-02 NOTE — Progress Notes (Signed)
Pre visit review using our clinic review tool, if applicable. No additional management support is needed unless otherwise documented below in the visit note. 

## 2015-08-04 DIAGNOSIS — L89154 Pressure ulcer of sacral region, stage 4: Secondary | ICD-10-CM | POA: Diagnosis not present

## 2015-08-04 DIAGNOSIS — B9562 Methicillin resistant Staphylococcus aureus infection as the cause of diseases classified elsewhere: Secondary | ICD-10-CM | POA: Diagnosis not present

## 2015-08-04 DIAGNOSIS — L89892 Pressure ulcer of other site, stage 2: Secondary | ICD-10-CM | POA: Diagnosis not present

## 2015-08-04 DIAGNOSIS — Z792 Long term (current) use of antibiotics: Secondary | ICD-10-CM | POA: Diagnosis not present

## 2015-08-04 DIAGNOSIS — N764 Abscess of vulva: Secondary | ICD-10-CM | POA: Diagnosis not present

## 2015-08-04 DIAGNOSIS — L02214 Cutaneous abscess of groin: Secondary | ICD-10-CM | POA: Diagnosis not present

## 2015-08-04 DIAGNOSIS — Z4800 Encounter for change or removal of nonsurgical wound dressing: Secondary | ICD-10-CM | POA: Diagnosis not present

## 2015-08-04 DIAGNOSIS — N390 Urinary tract infection, site not specified: Secondary | ICD-10-CM | POA: Diagnosis not present

## 2015-08-04 DIAGNOSIS — G8250 Quadriplegia, unspecified: Secondary | ICD-10-CM | POA: Diagnosis not present

## 2015-08-04 DIAGNOSIS — Z466 Encounter for fitting and adjustment of urinary device: Secondary | ICD-10-CM | POA: Diagnosis not present

## 2015-08-04 DIAGNOSIS — Z7901 Long term (current) use of anticoagulants: Secondary | ICD-10-CM | POA: Diagnosis not present

## 2015-08-04 DIAGNOSIS — Z993 Dependence on wheelchair: Secondary | ICD-10-CM | POA: Diagnosis not present

## 2015-08-04 DIAGNOSIS — N319 Neuromuscular dysfunction of bladder, unspecified: Secondary | ICD-10-CM | POA: Diagnosis not present

## 2015-08-06 ENCOUNTER — Emergency Department (HOSPITAL_BASED_OUTPATIENT_CLINIC_OR_DEPARTMENT_OTHER)
Admission: EM | Admit: 2015-08-06 | Discharge: 2015-08-06 | Disposition: A | Discharge status: Home / Self Care | Payer: Medicare Other | Attending: Emergency Medicine | Admitting: Emergency Medicine

## 2015-08-06 ENCOUNTER — Encounter (HOSPITAL_BASED_OUTPATIENT_CLINIC_OR_DEPARTMENT_OTHER): Payer: Self-pay

## 2015-08-06 DIAGNOSIS — R238 Other skin changes: Secondary | ICD-10-CM

## 2015-08-06 DIAGNOSIS — Z993 Dependence on wheelchair: Secondary | ICD-10-CM | POA: Diagnosis not present

## 2015-08-06 DIAGNOSIS — N764 Abscess of vulva: Secondary | ICD-10-CM | POA: Diagnosis not present

## 2015-08-06 DIAGNOSIS — Z8742 Personal history of other diseases of the female genital tract: Secondary | ICD-10-CM | POA: Insufficient documentation

## 2015-08-06 DIAGNOSIS — L89212 Pressure ulcer of right hip, stage 2: Secondary | ICD-10-CM | POA: Insufficient documentation

## 2015-08-06 DIAGNOSIS — E559 Vitamin D deficiency, unspecified: Secondary | ICD-10-CM | POA: Diagnosis not present

## 2015-08-06 DIAGNOSIS — L989 Disorder of the skin and subcutaneous tissue, unspecified: Secondary | ICD-10-CM | POA: Diagnosis not present

## 2015-08-06 DIAGNOSIS — Z79899 Other long term (current) drug therapy: Secondary | ICD-10-CM | POA: Insufficient documentation

## 2015-08-06 DIAGNOSIS — N319 Neuromuscular dysfunction of bladder, unspecified: Secondary | ICD-10-CM | POA: Diagnosis not present

## 2015-08-06 DIAGNOSIS — E785 Hyperlipidemia, unspecified: Secondary | ICD-10-CM | POA: Diagnosis not present

## 2015-08-06 DIAGNOSIS — L089 Local infection of the skin and subcutaneous tissue, unspecified: Secondary | ICD-10-CM | POA: Diagnosis present

## 2015-08-06 DIAGNOSIS — Z4800 Encounter for change or removal of nonsurgical wound dressing: Secondary | ICD-10-CM | POA: Diagnosis not present

## 2015-08-06 DIAGNOSIS — Z91040 Latex allergy status: Secondary | ICD-10-CM | POA: Insufficient documentation

## 2015-08-06 DIAGNOSIS — L02214 Cutaneous abscess of groin: Secondary | ICD-10-CM | POA: Diagnosis not present

## 2015-08-06 DIAGNOSIS — Z466 Encounter for fitting and adjustment of urinary device: Secondary | ICD-10-CM | POA: Diagnosis not present

## 2015-08-06 DIAGNOSIS — Z86718 Personal history of other venous thrombosis and embolism: Secondary | ICD-10-CM | POA: Diagnosis not present

## 2015-08-06 DIAGNOSIS — L89892 Pressure ulcer of other site, stage 2: Secondary | ICD-10-CM | POA: Diagnosis not present

## 2015-08-06 DIAGNOSIS — B9562 Methicillin resistant Staphylococcus aureus infection as the cause of diseases classified elsewhere: Secondary | ICD-10-CM | POA: Diagnosis not present

## 2015-08-06 DIAGNOSIS — Z87440 Personal history of urinary (tract) infections: Secondary | ICD-10-CM | POA: Diagnosis not present

## 2015-08-06 DIAGNOSIS — Z9359 Other cystostomy status: Secondary | ICD-10-CM | POA: Diagnosis not present

## 2015-08-06 DIAGNOSIS — M810 Age-related osteoporosis without current pathological fracture: Secondary | ICD-10-CM | POA: Diagnosis not present

## 2015-08-06 DIAGNOSIS — L89154 Pressure ulcer of sacral region, stage 4: Secondary | ICD-10-CM | POA: Diagnosis not present

## 2015-08-06 DIAGNOSIS — Z7901 Long term (current) use of anticoagulants: Secondary | ICD-10-CM | POA: Diagnosis not present

## 2015-08-06 DIAGNOSIS — N390 Urinary tract infection, site not specified: Secondary | ICD-10-CM | POA: Diagnosis not present

## 2015-08-06 DIAGNOSIS — Z8619 Personal history of other infectious and parasitic diseases: Secondary | ICD-10-CM | POA: Insufficient documentation

## 2015-08-06 DIAGNOSIS — Z792 Long term (current) use of antibiotics: Secondary | ICD-10-CM | POA: Diagnosis not present

## 2015-08-06 DIAGNOSIS — L89312 Pressure ulcer of right buttock, stage 2: Secondary | ICD-10-CM

## 2015-08-06 DIAGNOSIS — Z87891 Personal history of nicotine dependence: Secondary | ICD-10-CM | POA: Insufficient documentation

## 2015-08-06 DIAGNOSIS — G8250 Quadriplegia, unspecified: Secondary | ICD-10-CM | POA: Diagnosis not present

## 2015-08-06 NOTE — ED Notes (Signed)
Wound to rt buttock one area is 1 cm in width and 2 cm in depth with gauze impregnated with ointment, scant amount yellow drainage. Smaller area less than 0.5 cm with scant depth open and no drainage present. New Myoplex dressing applied over area.
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
## 2015-08-06 NOTE — ED Notes (Signed)
PA at bedside.

## 2015-08-06 NOTE — ED Notes (Signed)
Boil on left buttock-pt states she was seen at wound care clinic with I&D 8/22-pt states her [facility] had her go into and be rechecked last Thursday on 8/25-today [facility] advised pt to come in today due to area looks worse-pt w/c bound

## 2015-08-06 NOTE — Discharge Instructions (Signed)
Pressure Ulcer A pressure ulcer is a sore that has formed from the breakdown of skin and exposure of deeper layers of tissue. It develops in areas of the body where there is unrelieved pressure. Pressure ulcers are usually found over a bony area, such as the shoulder blades, spine, lower back, hips, knees, ankles, and heels. Pressure ulcers vary in severity. Your health care provider may determine the severity (stage) of your pressure ulcer. The stages include:  Stage I--The skin is red, and when the skin is pressed, it stays red.  Stage II--The top layer of skin is gone, and there is a shallow, pink ulcer.  Stage III--The ulcer becomes deeper, and it is more difficult to see the whole wound. Also, there may be yellow or brown parts, as well as pink and red parts.  Stage IV--The ulcer may be deep and red, pink, brown, white, or yellow. Bone or muscle may be seen.  Unstageable pressure ulcer--The ulcer is covered almost completely with black, brown, or yellow tissue. It is not known how deep the ulcer is or what stage it is until this covering comes off.  Suspected deep tissue injury--A person's skin can be injured from pressure or pulling on the skin when his or her position is changed. The skin appears purple or maroon. There may not be an opening in the skin, but there could be a blood-filled blister. This deep tissue injury is often difficult to see in people with darker skin tones. The site may open and become deeper in time. However, early interventions will help the area heal and may prevent the area from opening. CAUSES  Pressure ulcers are caused by pressure against the skin that limits the flow of blood to the skin and nearby tissues. There are many risk factors that can lead to pressure sores. RISK FACTORS  Decreased ability to move.  Decreased ability to feel pain or discomfort.  Excessive skin moisture from urine, stool, sweat, or secretions.  Poor  nutrition.  Dehydration.  Tobacco, drug, or alcohol abuse.  Having someone pull on bedsheets that are under you, such as when health care workers are changing your position in a hospital bed.  Obesity.  Increased adult age.  Hospitalization in a critical care unit for longer than 4 days with use of medical devices.  Prolonged use of medical devices.  Critical illness.  Anemia.  Traumatic brain injury.  Spinal cord injury.  Stroke.  Diabetes.  Poor blood glucose control.  Low blood pressure (hypotension).  Low oxygen levels.  Medicines that reduce blood flow.  Infection. DIAGNOSIS  Your health care provider will diagnose your pressure ulcer based on its appearance. The health care provider may determine the stage of your pressure ulcer as well. Tests may be done to check for infection, to assess your circulation, or to check for other diseases, such as diabetes. TREATMENT  Treatment of your pressure ulcer begins with determining what stage the ulcer is in. Your treatment team may include your health care provider, a wound care specialist, a nutritionist, a physical therapist, and a Psychologist, sport and exercise. Possible treatments may include:   Moving or repositioning every 1-2 hours.  Using beds or mattresses to shift your body weight and pressure points frequently.  Improving your diet.  Cleaning and bandaging (dressing) the open wound.  Giving antibiotic medicines.  Removing damaged tissue.  Surgery and sometimes skin grafts. HOME CARE INSTRUCTIONS  If you were hospitalized, follow the care plan that was started in the hospital.  Avoid staying in the same position for more than 2 hours. Use padding, devices, or mattresses to cushion your pressure points as directed by your health care provider.  Eat a well-balanced diet. Take nutritional supplements and vitamins as directed by your health care provider.  Keep all follow-up appointments.  Only take over-the-counter or  prescription medicines for pain, fever, or discomfort as directed by your health care provider. SEEK MEDICAL CARE IF:   Your pressure ulcer is not improving.  You do not know how to care for your pressure ulcer.  You notice other areas of redness on your skin.  You have a fever. SEEK IMMEDIATE MEDICAL CARE IF:   You have increasing redness, swelling, or pain in your pressure ulcer.  You notice pus coming from your pressure ulcer.  You notice a bad smell coming from the wound or dressing.  Your pressure ulcer opens up again. Document Released: 11/20/2005 Document Revised: 11/25/2013 Document Reviewed: 07/28/2013 [facility] Patient Information 2015 [place], [place]. This information is not intended to replace advice given to you by your health care provider. Make sure you discuss any questions you have with your health care provider.  Skin Ulcer A skin ulcer is an open sore that can be shallow or deep. Skin ulcers sometimes become infected and are difficult to treat. It may be 1 month or longer before real healing progress is made. CAUSES   Injury.  Problems with the veins or arteries.  Diabetes.  Insect bites.  Bedsores.  Inflammatory conditions. SYMPTOMS   Pain, redness, swelling, and tenderness around the ulcer.  Fever.  Bleeding from the ulcer.  Yellow or clear fluid coming from the ulcer. DIAGNOSIS  There are many types of skin ulcers. Any open sores will be examined. Certain tests will be done to determine the kind of ulcer you have. The right treatment depends on the type of ulcer you have. TREATMENT  Treatment is a long-term challenge. It may include:  Wearing an elastic wrap, compression stockings, or gel cast over the ulcer area.  Taking antibiotic medicines or putting antibiotic creams on the affected area if there is an infection. HOME CARE INSTRUCTIONS  Put on your bandages (dressings), wraps, or casts over the ulcer as directed by your  caregiver.  Change all dressings as directed by your caregiver.  Take all medicines as directed by your caregiver.  Keep the affected area clean and dry.  Avoid injuries to the affected area.  Eat a well-balanced, healthy diet that includes plenty of fruit and vegetables.  If you smoke, consider quitting or decreasing the amount of cigarettes you smoke.  Once the ulcer heals, get regular exercise as directed by your caregiver.  Work with your caregiver to make sure your blood pressure, cholesterol, and diabetes are well-controlled.  Keep your skin moisturized. Dry skin can crack and lead to skin ulcers. SEEK IMMEDIATE MEDICAL CARE IF:   Your pain gets worse.  You have swelling, redness, or fluids around the ulcer.  You have chills.  You have a fever. MAKE SURE YOU:   Understand these instructions.  Will watch your condition.  Will get help right away if you are not doing well or get worse. Document Released: 12/28/2004 Document Revised: 02/12/2012 Document Reviewed: 07/07/2011 [facility] Patient Information 2015 [place], [place]. This information is not intended to replace advice given to you by your health care provider. Make sure you discuss any questions you have with your health care provider.  Follow up with wound care out pt.  Continue current antibiotic regimen. Return if fevers, chills occur.

## 2015-08-11 DIAGNOSIS — B9562 Methicillin resistant Staphylococcus aureus infection as the cause of diseases classified elsewhere: Secondary | ICD-10-CM | POA: Diagnosis not present

## 2015-08-11 DIAGNOSIS — Z993 Dependence on wheelchair: Secondary | ICD-10-CM | POA: Diagnosis not present

## 2015-08-11 DIAGNOSIS — N764 Abscess of vulva: Secondary | ICD-10-CM | POA: Diagnosis not present

## 2015-08-11 DIAGNOSIS — Z4800 Encounter for change or removal of nonsurgical wound dressing: Secondary | ICD-10-CM | POA: Diagnosis not present

## 2015-08-11 DIAGNOSIS — L89892 Pressure ulcer of other site, stage 2: Secondary | ICD-10-CM | POA: Diagnosis not present

## 2015-08-11 DIAGNOSIS — N319 Neuromuscular dysfunction of bladder, unspecified: Secondary | ICD-10-CM | POA: Diagnosis not present

## 2015-08-11 DIAGNOSIS — L02214 Cutaneous abscess of groin: Secondary | ICD-10-CM | POA: Diagnosis not present

## 2015-08-11 DIAGNOSIS — Z7901 Long term (current) use of anticoagulants: Secondary | ICD-10-CM | POA: Diagnosis not present

## 2015-08-11 DIAGNOSIS — Z792 Long term (current) use of antibiotics: Secondary | ICD-10-CM | POA: Diagnosis not present

## 2015-08-11 DIAGNOSIS — N390 Urinary tract infection, site not specified: Secondary | ICD-10-CM | POA: Diagnosis not present

## 2015-08-11 DIAGNOSIS — Z466 Encounter for fitting and adjustment of urinary device: Secondary | ICD-10-CM | POA: Diagnosis not present

## 2015-08-11 DIAGNOSIS — G8250 Quadriplegia, unspecified: Secondary | ICD-10-CM | POA: Diagnosis not present

## 2015-08-11 DIAGNOSIS — L89154 Pressure ulcer of sacral region, stage 4: Secondary | ICD-10-CM | POA: Diagnosis not present

## 2015-08-13 DIAGNOSIS — L89154 Pressure ulcer of sacral region, stage 4: Secondary | ICD-10-CM | POA: Diagnosis not present

## 2015-08-13 DIAGNOSIS — G8250 Quadriplegia, unspecified: Secondary | ICD-10-CM | POA: Diagnosis not present

## 2015-08-13 DIAGNOSIS — N390 Urinary tract infection, site not specified: Secondary | ICD-10-CM | POA: Diagnosis not present

## 2015-08-13 DIAGNOSIS — Z993 Dependence on wheelchair: Secondary | ICD-10-CM | POA: Diagnosis not present

## 2015-08-13 DIAGNOSIS — Z466 Encounter for fitting and adjustment of urinary device: Secondary | ICD-10-CM | POA: Diagnosis not present

## 2015-08-13 DIAGNOSIS — Z792 Long term (current) use of antibiotics: Secondary | ICD-10-CM | POA: Diagnosis not present

## 2015-08-13 DIAGNOSIS — L89892 Pressure ulcer of other site, stage 2: Secondary | ICD-10-CM | POA: Diagnosis not present

## 2015-08-13 DIAGNOSIS — N319 Neuromuscular dysfunction of bladder, unspecified: Secondary | ICD-10-CM | POA: Diagnosis not present

## 2015-08-13 DIAGNOSIS — Z7901 Long term (current) use of anticoagulants: Secondary | ICD-10-CM | POA: Diagnosis not present

## 2015-08-13 DIAGNOSIS — Z4800 Encounter for change or removal of nonsurgical wound dressing: Secondary | ICD-10-CM | POA: Diagnosis not present

## 2015-08-13 DIAGNOSIS — L02214 Cutaneous abscess of groin: Secondary | ICD-10-CM | POA: Diagnosis not present

## 2015-08-13 DIAGNOSIS — N764 Abscess of vulva: Secondary | ICD-10-CM | POA: Diagnosis not present

## 2015-08-13 DIAGNOSIS — B9562 Methicillin resistant Staphylococcus aureus infection as the cause of diseases classified elsewhere: Secondary | ICD-10-CM | POA: Diagnosis not present

## 2015-08-16 DIAGNOSIS — Z792 Long term (current) use of antibiotics: Secondary | ICD-10-CM | POA: Diagnosis not present

## 2015-08-16 DIAGNOSIS — Z4800 Encounter for change or removal of nonsurgical wound dressing: Secondary | ICD-10-CM | POA: Diagnosis not present

## 2015-08-16 DIAGNOSIS — L89892 Pressure ulcer of other site, stage 2: Secondary | ICD-10-CM | POA: Diagnosis not present

## 2015-08-16 DIAGNOSIS — Z7901 Long term (current) use of anticoagulants: Secondary | ICD-10-CM | POA: Diagnosis not present

## 2015-08-16 DIAGNOSIS — L02214 Cutaneous abscess of groin: Secondary | ICD-10-CM | POA: Diagnosis not present

## 2015-08-16 DIAGNOSIS — N319 Neuromuscular dysfunction of bladder, unspecified: Secondary | ICD-10-CM | POA: Diagnosis not present

## 2015-08-16 DIAGNOSIS — R339 Retention of urine, unspecified: Secondary | ICD-10-CM | POA: Diagnosis not present

## 2015-08-16 DIAGNOSIS — B9562 Methicillin resistant Staphylococcus aureus infection as the cause of diseases classified elsewhere: Secondary | ICD-10-CM | POA: Diagnosis not present

## 2015-08-16 DIAGNOSIS — L89154 Pressure ulcer of sacral region, stage 4: Secondary | ICD-10-CM | POA: Diagnosis not present

## 2015-08-16 DIAGNOSIS — Z993 Dependence on wheelchair: Secondary | ICD-10-CM | POA: Diagnosis not present

## 2015-08-16 DIAGNOSIS — N390 Urinary tract infection, site not specified: Secondary | ICD-10-CM | POA: Diagnosis not present

## 2015-08-16 DIAGNOSIS — N764 Abscess of vulva: Secondary | ICD-10-CM | POA: Diagnosis not present

## 2015-08-16 DIAGNOSIS — N3020 Other chronic cystitis without hematuria: Secondary | ICD-10-CM | POA: Diagnosis not present

## 2015-08-16 DIAGNOSIS — N3000 Acute cystitis without hematuria: Secondary | ICD-10-CM | POA: Diagnosis not present

## 2015-08-16 DIAGNOSIS — G8250 Quadriplegia, unspecified: Secondary | ICD-10-CM | POA: Diagnosis not present

## 2015-08-16 DIAGNOSIS — Z466 Encounter for fitting and adjustment of urinary device: Secondary | ICD-10-CM | POA: Diagnosis not present

## 2015-08-18 DIAGNOSIS — L89224 Pressure ulcer of left hip, stage 4: Secondary | ICD-10-CM | POA: Diagnosis not present

## 2015-08-18 DIAGNOSIS — S31829A Unspecified open wound of left buttock, initial encounter: Secondary | ICD-10-CM | POA: Diagnosis not present

## 2015-08-20 DIAGNOSIS — Z993 Dependence on wheelchair: Secondary | ICD-10-CM | POA: Diagnosis not present

## 2015-08-20 DIAGNOSIS — Z792 Long term (current) use of antibiotics: Secondary | ICD-10-CM | POA: Diagnosis not present

## 2015-08-20 DIAGNOSIS — N390 Urinary tract infection, site not specified: Secondary | ICD-10-CM | POA: Diagnosis not present

## 2015-08-20 DIAGNOSIS — B9562 Methicillin resistant Staphylococcus aureus infection as the cause of diseases classified elsewhere: Secondary | ICD-10-CM | POA: Diagnosis not present

## 2015-08-20 DIAGNOSIS — G8250 Quadriplegia, unspecified: Secondary | ICD-10-CM | POA: Diagnosis not present

## 2015-08-20 DIAGNOSIS — L89154 Pressure ulcer of sacral region, stage 4: Secondary | ICD-10-CM | POA: Diagnosis not present

## 2015-08-20 DIAGNOSIS — N319 Neuromuscular dysfunction of bladder, unspecified: Secondary | ICD-10-CM | POA: Diagnosis not present

## 2015-08-20 DIAGNOSIS — Z7901 Long term (current) use of anticoagulants: Secondary | ICD-10-CM | POA: Diagnosis not present

## 2015-08-20 DIAGNOSIS — Z466 Encounter for fitting and adjustment of urinary device: Secondary | ICD-10-CM | POA: Diagnosis not present

## 2015-08-20 DIAGNOSIS — L89892 Pressure ulcer of other site, stage 2: Secondary | ICD-10-CM | POA: Diagnosis not present

## 2015-08-20 DIAGNOSIS — Z4800 Encounter for change or removal of nonsurgical wound dressing: Secondary | ICD-10-CM | POA: Diagnosis not present

## 2015-08-20 DIAGNOSIS — L02214 Cutaneous abscess of groin: Secondary | ICD-10-CM | POA: Diagnosis not present

## 2015-08-20 DIAGNOSIS — N764 Abscess of vulva: Secondary | ICD-10-CM | POA: Diagnosis not present

## 2015-08-23 ENCOUNTER — Encounter (HOSPITAL_BASED_OUTPATIENT_CLINIC_OR_DEPARTMENT_OTHER): Payer: Medicare Other | Attending: Plastic Surgery

## 2015-08-23 DIAGNOSIS — L89892 Pressure ulcer of other site, stage 2: Secondary | ICD-10-CM | POA: Diagnosis not present

## 2015-08-23 DIAGNOSIS — L89322 Pressure ulcer of left buttock, stage 2: Secondary | ICD-10-CM | POA: Diagnosis not present

## 2015-08-24 DIAGNOSIS — I82409 Acute embolism and thrombosis of unspecified deep veins of unspecified lower extremity: Secondary | ICD-10-CM | POA: Diagnosis not present

## 2015-08-25 ENCOUNTER — Telehealth: Payer: Self-pay | Admitting: Internal Medicine

## 2015-08-25 ENCOUNTER — Ambulatory Visit (INDEPENDENT_AMBULATORY_CARE_PROVIDER_SITE_OTHER): Payer: Self-pay | Admitting: General Practice

## 2015-08-25 DIAGNOSIS — N764 Abscess of vulva: Secondary | ICD-10-CM | POA: Diagnosis not present

## 2015-08-25 DIAGNOSIS — N319 Neuromuscular dysfunction of bladder, unspecified: Secondary | ICD-10-CM | POA: Diagnosis not present

## 2015-08-25 DIAGNOSIS — Z7901 Long term (current) use of anticoagulants: Secondary | ICD-10-CM | POA: Diagnosis not present

## 2015-08-25 DIAGNOSIS — S31829A Unspecified open wound of left buttock, initial encounter: Secondary | ICD-10-CM | POA: Diagnosis not present

## 2015-08-25 DIAGNOSIS — Z993 Dependence on wheelchair: Secondary | ICD-10-CM | POA: Diagnosis not present

## 2015-08-25 DIAGNOSIS — L02214 Cutaneous abscess of groin: Secondary | ICD-10-CM | POA: Diagnosis not present

## 2015-08-25 DIAGNOSIS — G8250 Quadriplegia, unspecified: Secondary | ICD-10-CM | POA: Diagnosis not present

## 2015-08-25 DIAGNOSIS — B9562 Methicillin resistant Staphylococcus aureus infection as the cause of diseases classified elsewhere: Secondary | ICD-10-CM | POA: Diagnosis not present

## 2015-08-25 DIAGNOSIS — Z792 Long term (current) use of antibiotics: Secondary | ICD-10-CM | POA: Diagnosis not present

## 2015-08-25 DIAGNOSIS — L89892 Pressure ulcer of other site, stage 2: Secondary | ICD-10-CM | POA: Diagnosis not present

## 2015-08-25 DIAGNOSIS — R339 Retention of urine, unspecified: Secondary | ICD-10-CM | POA: Diagnosis not present

## 2015-08-25 DIAGNOSIS — S91302A Unspecified open wound, left foot, initial encounter: Secondary | ICD-10-CM | POA: Diagnosis not present

## 2015-08-25 DIAGNOSIS — N390 Urinary tract infection, site not specified: Secondary | ICD-10-CM | POA: Diagnosis not present

## 2015-08-25 DIAGNOSIS — Z4800 Encounter for change or removal of nonsurgical wound dressing: Secondary | ICD-10-CM | POA: Diagnosis not present

## 2015-08-25 DIAGNOSIS — I82409 Acute embolism and thrombosis of unspecified deep veins of unspecified lower extremity: Secondary | ICD-10-CM | POA: Diagnosis not present

## 2015-08-25 DIAGNOSIS — Z466 Encounter for fitting and adjustment of urinary device: Secondary | ICD-10-CM | POA: Diagnosis not present

## 2015-08-25 DIAGNOSIS — L89154 Pressure ulcer of sacral region, stage 4: Secondary | ICD-10-CM | POA: Diagnosis not present

## 2015-08-25 DIAGNOSIS — Z5181 Encounter for therapeutic drug level monitoring: Secondary | ICD-10-CM

## 2015-08-25 LAB — POCT INR: INR: 4.2

## 2015-08-25 NOTE — Progress Notes (Signed)
I have reviewed and agree with the plan. 

## 2015-08-25 NOTE — Telephone Encounter (Signed)
Discharged due to wound care not getting better and not able to meet goals. Pt refuses to reduce sitting time and the pt actively conflicts interest between care provider (ie: urology, primary, wound care).

## 2015-08-25 NOTE — Telephone Encounter (Signed)
Cara from [facility] would like to speak with you ASAP She wouldn't give me a reason. She can be reached at [phone]

## 2015-08-25 NOTE — Progress Notes (Signed)
Pre visit review using our clinic review tool, if applicable. No additional management support is needed unless otherwise documented below in the visit note. 

## 2015-08-27 DIAGNOSIS — N764 Abscess of vulva: Secondary | ICD-10-CM | POA: Diagnosis not present

## 2015-08-27 DIAGNOSIS — N319 Neuromuscular dysfunction of bladder, unspecified: Secondary | ICD-10-CM | POA: Diagnosis not present

## 2015-08-27 DIAGNOSIS — Z993 Dependence on wheelchair: Secondary | ICD-10-CM | POA: Diagnosis not present

## 2015-08-27 DIAGNOSIS — L02214 Cutaneous abscess of groin: Secondary | ICD-10-CM | POA: Diagnosis not present

## 2015-08-27 DIAGNOSIS — L89154 Pressure ulcer of sacral region, stage 4: Secondary | ICD-10-CM | POA: Diagnosis not present

## 2015-08-27 DIAGNOSIS — G8250 Quadriplegia, unspecified: Secondary | ICD-10-CM | POA: Diagnosis not present

## 2015-08-27 DIAGNOSIS — Z792 Long term (current) use of antibiotics: Secondary | ICD-10-CM | POA: Diagnosis not present

## 2015-08-27 DIAGNOSIS — Z7901 Long term (current) use of anticoagulants: Secondary | ICD-10-CM | POA: Diagnosis not present

## 2015-08-27 DIAGNOSIS — N390 Urinary tract infection, site not specified: Secondary | ICD-10-CM | POA: Diagnosis not present

## 2015-08-27 DIAGNOSIS — L89892 Pressure ulcer of other site, stage 2: Secondary | ICD-10-CM | POA: Diagnosis not present

## 2015-08-27 DIAGNOSIS — Z4800 Encounter for change or removal of nonsurgical wound dressing: Secondary | ICD-10-CM | POA: Diagnosis not present

## 2015-08-27 DIAGNOSIS — Z466 Encounter for fitting and adjustment of urinary device: Secondary | ICD-10-CM | POA: Diagnosis not present

## 2015-08-27 DIAGNOSIS — B9562 Methicillin resistant Staphylococcus aureus infection as the cause of diseases classified elsewhere: Secondary | ICD-10-CM | POA: Diagnosis not present

## 2015-08-27 DIAGNOSIS — R339 Retention of urine, unspecified: Secondary | ICD-10-CM | POA: Diagnosis not present

## 2015-08-31 DIAGNOSIS — H2513 Age-related nuclear cataract, bilateral: Secondary | ICD-10-CM | POA: Diagnosis not present

## 2015-08-31 DIAGNOSIS — D3132 Benign neoplasm of left choroid: Secondary | ICD-10-CM | POA: Diagnosis not present

## 2015-08-31 DIAGNOSIS — H31093 Other chorioretinal scars, bilateral: Secondary | ICD-10-CM | POA: Diagnosis not present

## 2015-08-31 DIAGNOSIS — I82409 Acute embolism and thrombosis of unspecified deep veins of unspecified lower extremity: Secondary | ICD-10-CM | POA: Diagnosis not present

## 2015-09-06 ENCOUNTER — Ambulatory Visit (INDEPENDENT_AMBULATORY_CARE_PROVIDER_SITE_OTHER): Payer: Medicare Other | Admitting: General Practice

## 2015-09-06 DIAGNOSIS — Z5181 Encounter for therapeutic drug level monitoring: Secondary | ICD-10-CM | POA: Diagnosis not present

## 2015-09-06 LAB — POCT INR: INR: 1.5

## 2015-09-06 NOTE — Progress Notes (Signed)
Pre visit review using our clinic review tool, if applicable. No additional management support is needed unless otherwise documented below in the visit note. 

## 2015-09-08 NOTE — Progress Notes (Signed)
I have reviewed and agree with the plan. 

## 2015-09-17 DIAGNOSIS — R339 Retention of urine, unspecified: Secondary | ICD-10-CM | POA: Diagnosis not present

## 2015-09-17 DIAGNOSIS — L89224 Pressure ulcer of left hip, stage 4: Secondary | ICD-10-CM | POA: Diagnosis not present

## 2015-09-17 DIAGNOSIS — I82409 Acute embolism and thrombosis of unspecified deep veins of unspecified lower extremity: Secondary | ICD-10-CM | POA: Diagnosis not present

## 2015-09-17 DIAGNOSIS — S91302A Unspecified open wound, left foot, initial encounter: Secondary | ICD-10-CM | POA: Diagnosis not present

## 2015-09-17 DIAGNOSIS — S31829A Unspecified open wound of left buttock, initial encounter: Secondary | ICD-10-CM | POA: Diagnosis not present

## 2015-09-21 ENCOUNTER — Ambulatory Visit (INDEPENDENT_AMBULATORY_CARE_PROVIDER_SITE_OTHER): Payer: Medicare Other | Admitting: General Practice

## 2015-09-21 DIAGNOSIS — Z5181 Encounter for therapeutic drug level monitoring: Secondary | ICD-10-CM

## 2015-09-21 LAB — POCT INR: INR: 1.9

## 2015-09-21 NOTE — Progress Notes (Signed)
Pre visit review using our clinic review tool, if applicable. No additional management support is needed unless otherwise documented below in the visit note. 

## 2015-09-21 NOTE — Progress Notes (Signed)
I have reviewed and agree with the plan. 

## 2015-09-28 ENCOUNTER — Telehealth: Payer: Self-pay | Admitting: Internal Medicine

## 2015-09-28 NOTE — Telephone Encounter (Signed)
[name] with [facility] [employer] [phone]  Option 4     Pt is requesting to be reactived for her INR testing

## 2015-09-29 NOTE — Telephone Encounter (Signed)
Verbal ok for same - please be sure these results are routed to [facility] for management once testing is resumed thanks

## 2015-09-29 NOTE — Telephone Encounter (Signed)
Verbal okay given and confirmed where the results would go.

## 2015-09-30 ENCOUNTER — Ambulatory Visit (INDEPENDENT_AMBULATORY_CARE_PROVIDER_SITE_OTHER): Payer: Medicare Other | Admitting: General Practice

## 2015-09-30 DIAGNOSIS — Z5181 Encounter for therapeutic drug level monitoring: Secondary | ICD-10-CM

## 2015-09-30 LAB — POCT INR: INR: 2.4

## 2015-09-30 NOTE — Progress Notes (Signed)
Pre visit review using our clinic review tool, if applicable. No additional management support is needed unless otherwise documented below in the visit note. 

## 2015-09-30 NOTE — Progress Notes (Signed)
I agree with this plan.

## 2015-10-01 ENCOUNTER — Telehealth: Payer: Self-pay | Admitting: Internal Medicine

## 2015-10-01 MED ORDER — DIAZEPAM 5 MG PO TABS: 5.0000 mg | ORAL_TABLET | Freq: Two times a day (BID) | ORAL | Status: DC

## 2015-10-01 NOTE — Telephone Encounter (Signed)
Pt called stating pharmacy has been trying to send request for diazepam (VALIUM) 5 MG tablet [[id number]  Pharmacy is [facility]  If they get the prescription by noon they will be able to deliver it to her.

## 2015-10-01 NOTE — Telephone Encounter (Signed)
Printed and signed.  

## 2015-10-01 NOTE — Telephone Encounter (Signed)
Faxed rx

## 2015-10-04 ENCOUNTER — Ambulatory Visit (INDEPENDENT_AMBULATORY_CARE_PROVIDER_SITE_OTHER): Payer: Medicare Other

## 2015-10-04 ENCOUNTER — Encounter (HOSPITAL_BASED_OUTPATIENT_CLINIC_OR_DEPARTMENT_OTHER): Payer: Medicare Other | Attending: Plastic Surgery

## 2015-10-04 DIAGNOSIS — Z23 Encounter for immunization: Secondary | ICD-10-CM

## 2015-10-04 DIAGNOSIS — L89624 Pressure ulcer of left heel, stage 4: Secondary | ICD-10-CM | POA: Diagnosis not present

## 2015-10-04 DIAGNOSIS — L89892 Pressure ulcer of other site, stage 2: Secondary | ICD-10-CM | POA: Insufficient documentation

## 2015-10-04 DIAGNOSIS — L89322 Pressure ulcer of left buttock, stage 2: Secondary | ICD-10-CM | POA: Insufficient documentation

## 2015-10-04 DIAGNOSIS — L89324 Pressure ulcer of left buttock, stage 4: Secondary | ICD-10-CM | POA: Insufficient documentation

## 2015-10-12 DIAGNOSIS — R339 Retention of urine, unspecified: Secondary | ICD-10-CM | POA: Diagnosis not present

## 2015-10-12 DIAGNOSIS — S31829A Unspecified open wound of left buttock, initial encounter: Secondary | ICD-10-CM | POA: Diagnosis not present

## 2015-10-12 DIAGNOSIS — S81801A Unspecified open wound, right lower leg, initial encounter: Secondary | ICD-10-CM | POA: Diagnosis not present

## 2015-10-12 DIAGNOSIS — S91302A Unspecified open wound, left foot, initial encounter: Secondary | ICD-10-CM | POA: Diagnosis not present

## 2015-10-15 DIAGNOSIS — N319 Neuromuscular dysfunction of bladder, unspecified: Secondary | ICD-10-CM | POA: Diagnosis not present

## 2015-10-15 DIAGNOSIS — N3000 Acute cystitis without hematuria: Secondary | ICD-10-CM | POA: Diagnosis not present

## 2015-10-15 DIAGNOSIS — N3020 Other chronic cystitis without hematuria: Secondary | ICD-10-CM | POA: Diagnosis not present

## 2015-10-15 DIAGNOSIS — N261 Atrophy of kidney (terminal): Secondary | ICD-10-CM | POA: Insufficient documentation

## 2015-10-15 DIAGNOSIS — G8250 Quadriplegia, unspecified: Secondary | ICD-10-CM | POA: Diagnosis not present

## 2015-10-15 DIAGNOSIS — R339 Retention of urine, unspecified: Secondary | ICD-10-CM | POA: Diagnosis not present

## 2015-10-18 DIAGNOSIS — L89224 Pressure ulcer of left hip, stage 4: Secondary | ICD-10-CM | POA: Diagnosis not present

## 2015-10-25 ENCOUNTER — Ambulatory Visit (INDEPENDENT_AMBULATORY_CARE_PROVIDER_SITE_OTHER): Payer: Medicare Other | Admitting: General Practice

## 2015-10-25 DIAGNOSIS — Z5181 Encounter for therapeutic drug level monitoring: Secondary | ICD-10-CM

## 2015-10-25 LAB — POCT INR: INR: 2.4

## 2015-10-25 NOTE — Progress Notes (Signed)
Ok with this plan 

## 2015-10-25 NOTE — Progress Notes (Signed)
Pre visit review using our clinic review tool, if applicable. No additional management support is needed unless otherwise documented below in the visit note. 

## 2015-11-01 ENCOUNTER — Encounter (HOSPITAL_BASED_OUTPATIENT_CLINIC_OR_DEPARTMENT_OTHER): Payer: Medicare Other | Attending: Plastic Surgery

## 2015-11-01 DIAGNOSIS — L89892 Pressure ulcer of other site, stage 2: Secondary | ICD-10-CM | POA: Insufficient documentation

## 2015-11-01 DIAGNOSIS — L89624 Pressure ulcer of left heel, stage 4: Secondary | ICD-10-CM | POA: Diagnosis not present

## 2015-11-01 DIAGNOSIS — H524 Presbyopia: Secondary | ICD-10-CM | POA: Diagnosis not present

## 2015-11-03 DIAGNOSIS — N3000 Acute cystitis without hematuria: Secondary | ICD-10-CM | POA: Diagnosis not present

## 2015-11-04 ENCOUNTER — Telehealth: Payer: Self-pay | Admitting: Internal Medicine

## 2015-11-04 NOTE — Telephone Encounter (Signed)
Pt called in and needs refill on her   warfarin (COUMADIN) 2 MG tablet [id number]     And the 4 Mg has well  Best number -[phone]

## 2015-11-05 DIAGNOSIS — I82409 Acute embolism and thrombosis of unspecified deep veins of unspecified lower extremity: Secondary | ICD-10-CM | POA: Diagnosis not present

## 2015-11-05 MED ORDER — WARFARIN SODIUM 2 MG PO TABS: ORAL_TABLET | ORAL | Status: DC

## 2015-11-05 MED ORDER — WARFARIN SODIUM 4 MG PO TABS: ORAL_TABLET | ORAL | Status: DC

## 2015-11-08 ENCOUNTER — Telehealth: Payer: Self-pay

## 2015-11-08 ENCOUNTER — Ambulatory Visit (INDEPENDENT_AMBULATORY_CARE_PROVIDER_SITE_OTHER): Payer: Medicare Other | Admitting: General Practice

## 2015-11-08 LAB — POCT INR: INR: 2.2

## 2015-11-08 NOTE — Progress Notes (Signed)
Pre visit review using our clinic review tool, if applicable. No additional management support is needed unless otherwise documented below in the visit note. 

## 2015-11-08 NOTE — Telephone Encounter (Signed)
rf rq for warfarin 2 mg tabs.

## 2015-11-08 NOTE — Progress Notes (Signed)
I agree with this plan.

## 2015-11-11 DIAGNOSIS — S91302A Unspecified open wound, left foot, initial encounter: Secondary | ICD-10-CM | POA: Diagnosis not present

## 2015-11-11 DIAGNOSIS — S31829A Unspecified open wound of left buttock, initial encounter: Secondary | ICD-10-CM | POA: Diagnosis not present

## 2015-11-11 DIAGNOSIS — R339 Retention of urine, unspecified: Secondary | ICD-10-CM | POA: Diagnosis not present

## 2015-11-16 ENCOUNTER — Ambulatory Visit (INDEPENDENT_AMBULATORY_CARE_PROVIDER_SITE_OTHER): Payer: Medicare Other | Admitting: General Practice

## 2015-11-16 LAB — POCT INR: INR: 1.2

## 2015-11-16 NOTE — Progress Notes (Signed)
Pre visit review using our clinic review tool, if applicable. No additional management support is needed unless otherwise documented below in the visit note. 

## 2015-11-16 NOTE — Progress Notes (Signed)
I have reviewed and agree with the plan. 

## 2015-11-17 DIAGNOSIS — L89224 Pressure ulcer of left hip, stage 4: Secondary | ICD-10-CM | POA: Diagnosis not present

## 2015-11-30 ENCOUNTER — Ambulatory Visit (INDEPENDENT_AMBULATORY_CARE_PROVIDER_SITE_OTHER): Payer: Medicare Other | Admitting: General Practice

## 2015-11-30 ENCOUNTER — Other Ambulatory Visit: Payer: Self-pay | Admitting: General Practice

## 2015-11-30 LAB — POCT INR: INR: 2.1

## 2015-11-30 MED ORDER — WARFARIN SODIUM 4 MG PO TABS: ORAL_TABLET | ORAL | Status: DC

## 2015-11-30 NOTE — Progress Notes (Signed)
Pre visit review using our clinic review tool, if applicable. No additional management support is needed unless otherwise documented below in the visit note. 

## 2015-12-13 ENCOUNTER — Encounter (HOSPITAL_BASED_OUTPATIENT_CLINIC_OR_DEPARTMENT_OTHER): Payer: Medicare Other | Attending: Internal Medicine

## 2015-12-13 DIAGNOSIS — L89624 Pressure ulcer of left heel, stage 4: Secondary | ICD-10-CM | POA: Insufficient documentation

## 2015-12-15 ENCOUNTER — Ambulatory Visit (INDEPENDENT_AMBULATORY_CARE_PROVIDER_SITE_OTHER): Payer: Medicare Other | Admitting: General Practice

## 2015-12-15 DIAGNOSIS — Z5181 Encounter for therapeutic drug level monitoring: Secondary | ICD-10-CM

## 2015-12-15 LAB — POCT INR: INR: 2.5

## 2015-12-15 NOTE — Progress Notes (Signed)
Pre visit review using our clinic review tool, if applicable. No additional management support is needed unless otherwise documented below in the visit note. 

## 2015-12-15 NOTE — Progress Notes (Signed)
I have reviewed and agree with the plan. 

## 2015-12-20 ENCOUNTER — Encounter (HOSPITAL_BASED_OUTPATIENT_CLINIC_OR_DEPARTMENT_OTHER): Payer: No Typology Code available for payment source | Attending: Internal Medicine

## 2015-12-20 DIAGNOSIS — L89522 Pressure ulcer of left ankle, stage 2: Secondary | ICD-10-CM | POA: Diagnosis not present

## 2015-12-20 DIAGNOSIS — L89624 Pressure ulcer of left heel, stage 4: Secondary | ICD-10-CM | POA: Diagnosis not present

## 2016-01-07 ENCOUNTER — Telehealth: Payer: Self-pay | Admitting: Internal Medicine

## 2016-01-07 NOTE — Telephone Encounter (Signed)
Pt called stating her medical supply company has been trying to contact you regarding her catheter supplies. She says she is 2 mos behind on her supplies. The medical supply company's phone number is [phone].

## 2016-01-10 ENCOUNTER — Ambulatory Visit (INDEPENDENT_AMBULATORY_CARE_PROVIDER_SITE_OTHER): Payer: Medicare Other | Admitting: General Practice

## 2016-01-10 ENCOUNTER — Other Ambulatory Visit: Payer: Self-pay | Admitting: Internal Medicine

## 2016-01-10 DIAGNOSIS — Z5181 Encounter for therapeutic drug level monitoring: Secondary | ICD-10-CM

## 2016-01-10 DIAGNOSIS — N3000 Acute cystitis without hematuria: Secondary | ICD-10-CM | POA: Diagnosis not present

## 2016-01-10 DIAGNOSIS — Z1231 Encounter for screening mammogram for malignant neoplasm of breast: Secondary | ICD-10-CM

## 2016-01-10 DIAGNOSIS — I82409 Acute embolism and thrombosis of unspecified deep veins of unspecified lower extremity: Secondary | ICD-10-CM | POA: Diagnosis not present

## 2016-01-10 DIAGNOSIS — S91302A Unspecified open wound, left foot, initial encounter: Secondary | ICD-10-CM | POA: Diagnosis not present

## 2016-01-10 DIAGNOSIS — S31829A Unspecified open wound of left buttock, initial encounter: Secondary | ICD-10-CM | POA: Diagnosis not present

## 2016-01-10 DIAGNOSIS — R339 Retention of urine, unspecified: Secondary | ICD-10-CM | POA: Diagnosis not present

## 2016-01-10 LAB — POCT INR: INR: 2.3

## 2016-01-10 NOTE — Progress Notes (Signed)
I agree with this plan.

## 2016-01-10 NOTE — Telephone Encounter (Signed)
Ok to give verbal 

## 2016-01-10 NOTE — Telephone Encounter (Signed)
Spoke with company. They are re-faxing orders but stated that they could go ahead and start process with verbal orders. Please advise if youre okay with that?

## 2016-01-10 NOTE — Progress Notes (Signed)
Pre visit review using our clinic review tool, if applicable. No additional management support is needed unless otherwise documented below in the visit note. 

## 2016-01-11 NOTE — Telephone Encounter (Signed)
Spoke with pt on 2/6. Pt stated that the orders that were sent over were not the correct orders. I advised pt to call the medical supply comp to be sure that they send over the orders for the correct supplies and we will sign them once we receive the correct orders.

## 2016-01-17 ENCOUNTER — Encounter (HOSPITAL_BASED_OUTPATIENT_CLINIC_OR_DEPARTMENT_OTHER): Payer: Medicare Other | Attending: Internal Medicine

## 2016-01-17 DIAGNOSIS — L89524 Pressure ulcer of left ankle, stage 4: Secondary | ICD-10-CM | POA: Diagnosis not present

## 2016-01-17 DIAGNOSIS — S71101A Unspecified open wound, right thigh, initial encounter: Secondary | ICD-10-CM | POA: Diagnosis not present

## 2016-01-17 DIAGNOSIS — X58XXXA Exposure to other specified factors, initial encounter: Secondary | ICD-10-CM | POA: Diagnosis not present

## 2016-01-17 DIAGNOSIS — L89624 Pressure ulcer of left heel, stage 4: Secondary | ICD-10-CM | POA: Insufficient documentation

## 2016-01-18 DIAGNOSIS — S31829A Unspecified open wound of left buttock, initial encounter: Secondary | ICD-10-CM | POA: Diagnosis not present

## 2016-01-18 DIAGNOSIS — S91302A Unspecified open wound, left foot, initial encounter: Secondary | ICD-10-CM | POA: Diagnosis not present

## 2016-01-27 ENCOUNTER — Telehealth: Payer: Self-pay | Admitting: *Deleted

## 2016-01-27 NOTE — Telephone Encounter (Signed)
Left msg on triage requesting verbal; order to ship out wound care supplies...[name]

## 2016-01-27 NOTE — Telephone Encounter (Signed)
ok 

## 2016-01-28 ENCOUNTER — Other Ambulatory Visit: Payer: Self-pay | Admitting: Internal Medicine

## 2016-01-28 DIAGNOSIS — S31829A Unspecified open wound of left buttock, initial encounter: Secondary | ICD-10-CM | POA: Diagnosis not present

## 2016-01-28 DIAGNOSIS — S91302A Unspecified open wound, left foot, initial encounter: Secondary | ICD-10-CM | POA: Diagnosis not present

## 2016-01-28 NOTE — Telephone Encounter (Signed)
Notified company with MD response...[name]

## 2016-01-28 NOTE — Telephone Encounter (Signed)
Please advise. Pt is being seen in April. Okay to refill?

## 2016-02-01 DIAGNOSIS — S31829A Unspecified open wound of left buttock, initial encounter: Secondary | ICD-10-CM | POA: Diagnosis not present

## 2016-02-01 DIAGNOSIS — S91302A Unspecified open wound, left foot, initial encounter: Secondary | ICD-10-CM | POA: Diagnosis not present

## 2016-02-04 ENCOUNTER — Telehealth: Payer: Self-pay

## 2016-02-04 NOTE — Telephone Encounter (Signed)
Spoke with rep from [employer] verbal orders, informed that faxes have been sent. Rep will be sending over repeat orders to have signed because they were not received.

## 2016-02-04 NOTE — Telephone Encounter (Signed)
Spoke with pt to clarify about the orders from [employer]

## 2016-02-04 NOTE — Telephone Encounter (Signed)
Please call pt regarding the supplies

## 2016-02-04 NOTE — Telephone Encounter (Signed)
Recd a call from [facility]. Patient has ordered syringes, gauze, catheter, and tray from them directly, they need a verbal order from [place] to fill this for the patient. She is a former [employer] patient that is transferring to [employer].  Please call [employer] at [phone] with order

## 2016-02-07 ENCOUNTER — Ambulatory Visit (INDEPENDENT_AMBULATORY_CARE_PROVIDER_SITE_OTHER): Payer: Medicare Other | Admitting: General Practice

## 2016-02-07 DIAGNOSIS — Z5181 Encounter for therapeutic drug level monitoring: Secondary | ICD-10-CM

## 2016-02-07 DIAGNOSIS — I82409 Acute embolism and thrombosis of unspecified deep veins of unspecified lower extremity: Secondary | ICD-10-CM | POA: Diagnosis not present

## 2016-02-07 DIAGNOSIS — R339 Retention of urine, unspecified: Secondary | ICD-10-CM | POA: Diagnosis not present

## 2016-02-07 LAB — POCT INR: INR: 3.2

## 2016-02-07 NOTE — Progress Notes (Signed)
Pre visit review using our clinic review tool, if applicable. No additional management support is needed unless otherwise documented below in the visit note. 

## 2016-02-07 NOTE — Progress Notes (Signed)
I agree with this plan.

## 2016-02-10 DIAGNOSIS — R339 Retention of urine, unspecified: Secondary | ICD-10-CM | POA: Diagnosis not present

## 2016-02-24 DIAGNOSIS — S31829A Unspecified open wound of left buttock, initial encounter: Secondary | ICD-10-CM | POA: Diagnosis not present

## 2016-02-24 DIAGNOSIS — S91302A Unspecified open wound, left foot, initial encounter: Secondary | ICD-10-CM | POA: Diagnosis not present

## 2016-02-25 DIAGNOSIS — R339 Retention of urine, unspecified: Secondary | ICD-10-CM | POA: Diagnosis not present

## 2016-02-28 ENCOUNTER — Encounter (HOSPITAL_BASED_OUTPATIENT_CLINIC_OR_DEPARTMENT_OTHER): Payer: Medicare Other | Attending: Internal Medicine

## 2016-02-28 ENCOUNTER — Other Ambulatory Visit: Payer: Self-pay | Admitting: Internal Medicine

## 2016-02-28 DIAGNOSIS — X58XXXA Exposure to other specified factors, initial encounter: Secondary | ICD-10-CM | POA: Diagnosis not present

## 2016-02-28 DIAGNOSIS — S91102A Unspecified open wound of left great toe without damage to nail, initial encounter: Secondary | ICD-10-CM | POA: Insufficient documentation

## 2016-02-28 DIAGNOSIS — L89524 Pressure ulcer of left ankle, stage 4: Secondary | ICD-10-CM | POA: Diagnosis not present

## 2016-02-28 DIAGNOSIS — L89624 Pressure ulcer of left heel, stage 4: Secondary | ICD-10-CM | POA: Diagnosis not present

## 2016-02-28 DIAGNOSIS — S71101A Unspecified open wound, right thigh, initial encounter: Secondary | ICD-10-CM | POA: Insufficient documentation

## 2016-02-28 DIAGNOSIS — I771 Stricture of artery: Secondary | ICD-10-CM | POA: Diagnosis not present

## 2016-02-28 DIAGNOSIS — Z1231 Encounter for screening mammogram for malignant neoplasm of breast: Secondary | ICD-10-CM

## 2016-02-29 ENCOUNTER — Other Ambulatory Visit: Payer: Self-pay | Admitting: Internal Medicine

## 2016-02-29 DIAGNOSIS — S91302A Unspecified open wound, left foot, initial encounter: Secondary | ICD-10-CM | POA: Diagnosis not present

## 2016-02-29 DIAGNOSIS — S31829A Unspecified open wound of left buttock, initial encounter: Secondary | ICD-10-CM | POA: Diagnosis not present

## 2016-02-29 DIAGNOSIS — R339 Retention of urine, unspecified: Secondary | ICD-10-CM | POA: Diagnosis not present

## 2016-03-06 ENCOUNTER — Telehealth: Payer: Self-pay | Admitting: General Practice

## 2016-03-06 ENCOUNTER — Ambulatory Visit (INDEPENDENT_AMBULATORY_CARE_PROVIDER_SITE_OTHER): Payer: Medicare Other | Admitting: General Practice

## 2016-03-06 DIAGNOSIS — Z5181 Encounter for therapeutic drug level monitoring: Secondary | ICD-10-CM

## 2016-03-06 LAB — POCT INR: INR: 2.6

## 2016-03-06 NOTE — Progress Notes (Signed)
I agree with this plan.

## 2016-03-06 NOTE — Telephone Encounter (Signed)
Pt called in her INR which is 2.6

## 2016-03-06 NOTE — Progress Notes (Signed)
Pre visit review using our clinic review tool, if applicable. No additional management support is needed unless otherwise documented below in the visit note. 

## 2016-03-27 ENCOUNTER — Telehealth: Payer: Self-pay | Admitting: Internal Medicine

## 2016-03-27 NOTE — Telephone Encounter (Signed)
Ok

## 2016-03-27 NOTE — Telephone Encounter (Signed)
Pt called stated she has an appt for a CPE on 03/30/16 and she was wondering if she can bring in sample of her urine as well, she has supra pubic catheter and it is cloudy and red. Please advise, can Dr. Quay Burow run a test that day or she need to call her urologist (Dr. Shanon Brow).   # [phone]

## 2016-03-27 NOTE — Telephone Encounter (Signed)
Please advise 

## 2016-03-28 NOTE — Telephone Encounter (Signed)
Spoke with pt to inform.  

## 2016-03-29 ENCOUNTER — Encounter: Payer: Medicare HMO | Admitting: Internal Medicine

## 2016-03-29 ENCOUNTER — Other Ambulatory Visit: Payer: Medicare Other

## 2016-03-29 ENCOUNTER — Other Ambulatory Visit (INDEPENDENT_AMBULATORY_CARE_PROVIDER_SITE_OTHER): Payer: Medicare Other

## 2016-03-29 ENCOUNTER — Ambulatory Visit (INDEPENDENT_AMBULATORY_CARE_PROVIDER_SITE_OTHER): Payer: Medicare Other | Admitting: Internal Medicine

## 2016-03-29 ENCOUNTER — Encounter: Payer: Self-pay | Admitting: Internal Medicine

## 2016-03-29 VITALS — BP 122/68 | HR 51 | Temp 97.4°F | Resp 16

## 2016-03-29 DIAGNOSIS — N319 Neuromuscular dysfunction of bladder, unspecified: Secondary | ICD-10-CM | POA: Diagnosis not present

## 2016-03-29 DIAGNOSIS — R8290 Unspecified abnormal findings in urine: Secondary | ICD-10-CM

## 2016-03-29 DIAGNOSIS — G5692 Unspecified mononeuropathy of left upper limb: Secondary | ICD-10-CM

## 2016-03-29 DIAGNOSIS — Z0001 Encounter for general adult medical examination with abnormal findings: Secondary | ICD-10-CM

## 2016-03-29 DIAGNOSIS — Z0000 Encounter for general adult medical examination without abnormal findings: Secondary | ICD-10-CM | POA: Diagnosis not present

## 2016-03-29 DIAGNOSIS — F418 Other specified anxiety disorders: Secondary | ICD-10-CM

## 2016-03-29 DIAGNOSIS — I82509 Chronic embolism and thrombosis of unspecified deep veins of unspecified lower extremity: Secondary | ICD-10-CM | POA: Diagnosis not present

## 2016-03-29 DIAGNOSIS — L89519 Pressure ulcer of right ankle, unspecified stage: Secondary | ICD-10-CM

## 2016-03-29 DIAGNOSIS — R6889 Other general symptoms and signs: Secondary | ICD-10-CM

## 2016-03-29 DIAGNOSIS — G5690 Unspecified mononeuropathy of unspecified upper limb: Secondary | ICD-10-CM | POA: Insufficient documentation

## 2016-03-29 LAB — CBC WITH DIFFERENTIAL/PLATELET
BASOS ABS: 0 10*3/uL (ref 0.0–0.1)
BASOS PCT: 0.3 % (ref 0.0–3.0)
EOS ABS: 0.1 10*3/uL (ref 0.0–0.7)
Eosinophils Relative: 0.8 % (ref 0.0–5.0)
HEMATOCRIT: 34.9 % — AB (ref 36.0–46.0)
HEMOGLOBIN: 11.9 g/dL — AB (ref 12.0–15.0)
LYMPHS PCT: 37.2 % (ref 12.0–46.0)
Lymphs Abs: 2.7 10*3/uL (ref 0.7–4.0)
MCHC: 34 g/dL (ref 30.0–36.0)
MCV: 91 fl (ref 78.0–100.0)
MONOS PCT: 7.4 % (ref 3.0–12.0)
Monocytes Absolute: 0.5 10*3/uL (ref 0.1–1.0)
NEUTROS ABS: 3.9 10*3/uL (ref 1.4–7.7)
Neutrophils Relative %: 54.3 % (ref 43.0–77.0)
Platelets: 172 10*3/uL (ref 150.0–400.0)
RBC: 3.84 Mil/uL — ABNORMAL LOW (ref 3.87–5.11)
RDW: 14.3 % (ref 11.5–15.5)
WBC: 7.2 10*3/uL (ref 4.0–10.5)

## 2016-03-29 LAB — COMPREHENSIVE METABOLIC PANEL
ALBUMIN: 3.4 g/dL — AB (ref 3.5–5.2)
ALK PHOS: 88 U/L (ref 39–117)
ALT: 18 U/L (ref 0–35)
AST: 21 U/L (ref 0–37)
BUN: 17 mg/dL (ref 6–23)
CALCIUM: 9.9 mg/dL (ref 8.4–10.5)
CO2: 33 mEq/L — ABNORMAL HIGH (ref 19–32)
Chloride: 99 mEq/L (ref 96–112)
Creatinine, Ser: 0.36 mg/dL — ABNORMAL LOW (ref 0.40–1.20)
GFR: 194.69 mL/min (ref >60.00)
Glucose, Bld: 73 mg/dL (ref 70–99)
POTASSIUM: 3.5 meq/L (ref 3.5–5.1)
Sodium: 139 mEq/L (ref 135–145)
Total Bilirubin: 0.3 mg/dL (ref 0.2–1.2)
Total Protein: 7.7 g/dL (ref 6.0–8.3)

## 2016-03-29 LAB — LIPID PANEL
CHOLESTEROL: 178 mg/dL (ref 0–200)
HDL: 44.7 mg/dL (ref >39.00)
LDL Cholesterol: 110 mg/dL — ABNORMAL HIGH (ref 0–99)
NonHDL: 133.51
TRIGLYCERIDES: 117 mg/dL (ref 0.0–149.0)
Total CHOL/HDL Ratio: 4
VLDL: 23.4 mg/dL (ref 0.0–40.0)

## 2016-03-29 LAB — TSH: TSH: 7.11 u[IU]/mL — ABNORMAL HIGH (ref 0.35–4.50)

## 2016-03-29 LAB — PROTIME-INR
INR: 3 ratio — ABNORMAL HIGH (ref 0.8–1.0)
Prothrombin Time: 32.6 s — ABNORMAL HIGH (ref 9.6–13.1)

## 2016-03-29 LAB — POCT URINALYSIS DIPSTICK
Bilirubin, UA: NEGATIVE
Blood, UA: NEGATIVE
GLUCOSE UA: NEGATIVE
KETONES UA: NEGATIVE
Nitrite, UA: NEGATIVE
SPEC GRAV UA: 1.01
Urobilinogen, UA: 0.2
pH, UA: 8.5

## 2016-03-29 MED ORDER — GABAPENTIN 100 MG PO CAPS: 100.0000 mg | ORAL_CAPSULE | Freq: Every day | ORAL | Status: DC

## 2016-03-29 NOTE — Assessment & Plan Note (Signed)
Followed at wound clinic

## 2016-03-29 NOTE — Assessment & Plan Note (Signed)
Continue current medications Following with urology

## 2016-03-29 NOTE — Assessment & Plan Note (Signed)
Continue xanax prn.   

## 2016-03-29 NOTE — Assessment & Plan Note (Signed)
Urine dip suggestive of a UTI  discussed antibiotic and she would prefer to wait for the culture results since she has had resistent infections in the past   She will call if her symptoms worsen We will call her with the culture results are back and send in a abx

## 2016-03-29 NOTE — Patient Instructions (Signed)
Test(s) ordered today. Your results will be released to [facility] (or called to you) after review, usually within 72hours after test completion. If any changes need to be made, you will be notified at that same time.  All other Health Maintenance issues reviewed.   All recommended immunizations and age-appropriate screenings are up-to-date or discussed.  No immunizations administered today.   Medications reviewed and updated.  Changes include trying gabapentin 100 mg nightly.   Your prescription(s) have been submitted to your pharmacy. Please take as directed and contact our office if you believe you are having problem(s) with the medication(s).    Please followup in one year  Health Maintenance, Female Adopting a healthy lifestyle and getting preventive care can go a long way to promote health and wellness. Talk with your health care provider about what schedule of regular examinations is right for you. This is a good chance for you to check in with your provider about disease prevention and staying healthy. In between checkups, there are plenty of things you can do on your own. Experts have done a lot of research about which lifestyle changes and preventive measures are most likely to keep you healthy. Ask your health care provider for more information. WEIGHT AND DIET  Eat a healthy diet  Be sure to include plenty of vegetables, fruits, low-fat dairy products, and lean protein.  Do not eat a lot of foods high in solid fats, added sugars, or salt.  Get regular exercise. This is one of the most important things you can do for your health.  Most adults should exercise for at least 150 minutes each week. The exercise should increase your heart rate and make you sweat (moderate-intensity exercise).  Most adults should also do strengthening exercises at least twice a week. This is in addition to the moderate-intensity exercise.  Maintain a healthy weight  Body mass index (BMI) is a  measurement that can be used to identify possible weight problems. It estimates body fat based on height and weight. Your health care provider can help determine your BMI and help you achieve or maintain a healthy weight.  For females 44 years of age and older:   A BMI below 18.5 is considered underweight.  A BMI of 18.5 to 24.9 is normal.  A BMI of 25 to 29.9 is considered overweight.  A BMI of 30 and above is considered obese.  Watch levels of cholesterol and blood lipids  You should start having your blood tested for lipids and cholesterol at 61 years of age, then have this test every 5 years.  You may need to have your cholesterol levels checked more often if:  Your lipid or cholesterol levels are high.  You are older than 61 years of age.  You are at high risk for heart disease.  CANCER SCREENING   Lung Cancer  Lung cancer screening is recommended for adults 37-74 years old who are at high risk for lung cancer because of a history of smoking.  A yearly low-dose CT scan of the lungs is recommended for people who:  Currently smoke.  Have quit within the past 15 years.  Have at least a 30-pack-year history of smoking. A pack year is smoking an average of one pack of cigarettes a day for 1 year.  Yearly screening should continue until it has been 15 years since you quit.  Yearly screening should stop if you develop a health problem that would prevent you from having lung cancer treatment.  Breast Cancer  Practice breast self-awareness. This means understanding how your breasts normally appear and feel.  It also means doing regular breast self-exams. Let your health care provider know about any changes, no matter how small.  If you are in your 20s or 30s, you should have a clinical breast exam (CBE) by a health care provider every 1-3 years as part of a regular health exam.  If you are 42 or older, have a CBE every year. Also consider having a breast X-ray  (mammogram) every year.  If you have a family history of breast cancer, talk to your health care provider about genetic screening.  If you are at high risk for breast cancer, talk to your health care provider about having an MRI and a mammogram every year.  Breast cancer gene (BRCA) assessment is recommended for women who have family members with BRCA-related cancers. BRCA-related cancers include:  Breast.  Ovarian.  Tubal.  Peritoneal cancers.  Results of the assessment will determine the need for genetic counseling and BRCA1 and BRCA2 testing. Cervical Cancer Your health care provider may recommend that you be screened regularly for cancer of the pelvic organs (ovaries, uterus, and vagina). This screening involves a pelvic examination, including checking for microscopic changes to the surface of your cervix (Pap test). You may be encouraged to have this screening done every 3 years, beginning at age 58.  For women ages 23-65, health care providers may recommend pelvic exams and Pap testing every 3 years, or they may recommend the Pap and pelvic exam, combined with testing for human papilloma virus (HPV), every 5 years. Some types of HPV increase your risk of cervical cancer. Testing for HPV may also be done on women of any age with unclear Pap test results.  Other health care providers may not recommend any screening for nonpregnant women who are considered low risk for pelvic cancer and who do not have symptoms. Ask your health care provider if a screening pelvic exam is right for you.  If you have had past treatment for cervical cancer or a condition that could lead to cancer, you need Pap tests and screening for cancer for at least 20 years after your treatment. If Pap tests have been discontinued, your risk factors (such as having a new sexual partner) need to be reassessed to determine if screening should resume. Some women have medical problems that increase the chance of getting  cervical cancer. In these cases, your health care provider may recommend more frequent screening and Pap tests. Colorectal Cancer  This type of cancer can be detected and often prevented.  Routine colorectal cancer screening usually begins at 61 years of age and continues through 61 years of age.  Your health care provider may recommend screening at an earlier age if you have risk factors for colon cancer.  Your health care provider may also recommend using home test kits to check for hidden blood in the stool.  A small camera at the end of a tube can be used to examine your colon directly (sigmoidoscopy or colonoscopy). This is done to check for the earliest forms of colorectal cancer.  Routine screening usually begins at age 77.  Direct examination of the colon should be repeated every 5-10 years through 61 years of age. However, you may need to be screened more often if early forms of precancerous polyps or small growths are found. Skin Cancer  Check your skin from head to toe regularly.  Tell your health care  provider about any new moles or changes in moles, especially if there is a change in a mole's shape or color.  Also tell your health care provider if you have a mole that is larger than the size of a pencil eraser.  Always use sunscreen. Apply sunscreen liberally and repeatedly throughout the day.  Protect yourself by wearing long sleeves, pants, a wide-brimmed hat, and sunglasses whenever you are outside. HEART DISEASE, DIABETES, AND HIGH BLOOD PRESSURE   High blood pressure causes heart disease and increases the risk of stroke. High blood pressure is more likely to develop in:  People who have blood pressure in the high end of the normal range (130-139/85-89 mm Hg).  People who are overweight or obese.  People who are African American.  If you are 20-79 years of age, have your blood pressure checked every 3-5 years. If you are 34 years of age or older, have your blood  pressure checked every year. You should have your blood pressure measured twice--once when you are at a hospital or clinic, and once when you are not at a hospital or clinic. Record the average of the two measurements. To check your blood pressure when you are not at a hospital or clinic, you can use:  An automated blood pressure machine at a pharmacy.  A home blood pressure monitor.  If you are between 73 years and 54 years old, ask your health care provider if you should take aspirin to prevent strokes.  Have regular diabetes screenings. This involves taking a blood sample to check your fasting blood sugar level.  If you are at a normal weight and have a low risk for diabetes, have this test once every three years after 61 years of age.  If you are overweight and have a high risk for diabetes, consider being tested at a younger age or more often. PREVENTING INFECTION  Hepatitis B  If you have a higher risk for hepatitis B, you should be screened for this virus. You are considered at high risk for hepatitis B if:  You were born in a country where hepatitis B is common. Ask your health care provider which countries are considered high risk.  Your parents were born in a high-risk country, and you have not been immunized against hepatitis B (hepatitis B vaccine).  You have HIV or AIDS.  You use needles to inject street drugs.  You live with someone who has hepatitis B.  You have had sex with someone who has hepatitis B.  You get hemodialysis treatment.  You take certain medicines for conditions, including cancer, organ transplantation, and autoimmune conditions. Hepatitis C  Blood testing is recommended for:  Everyone born from [birth year] through 1966.  Anyone with known risk factors for hepatitis C. Sexually transmitted infections (STIs)  You should be screened for sexually transmitted infections (STIs) including gonorrhea and chlamydia if:  You are sexually active and are  younger than 61 years of age.  You are older than 61 years of age and your health care provider tells you that you are at risk for this type of infection.  Your sexual activity has changed since you were last screened and you are at an increased risk for chlamydia or gonorrhea. Ask your health care provider if you are at risk.  If you do not have HIV, but are at risk, it may be recommended that you take a prescription medicine daily to prevent HIV infection. This is called pre-exposure prophylaxis (PrEP). You are  considered at risk if:  You are sexually active and do not regularly use condoms or know the HIV status of your partner(s).  You take drugs by injection.  You are sexually active with a partner who has HIV. Talk with your health care provider about whether you are at high risk of being infected with HIV. If you choose to begin PrEP, you should first be tested for HIV. You should then be tested every 3 months for as long as you are taking PrEP.  PREGNANCY   If you are premenopausal and you may become pregnant, ask your health care provider about preconception counseling.  If you may become pregnant, take 400 to 800 micrograms (mcg) of folic acid every day.  If you want to prevent pregnancy, talk to your health care provider about birth control (contraception). OSTEOPOROSIS AND MENOPAUSE   Osteoporosis is a disease in which the bones lose minerals and strength with aging. This can result in serious bone fractures. Your risk for osteoporosis can be identified using a bone density scan.  If you are 69 years of age or older, or if you are at risk for osteoporosis and fractures, ask your health care provider if you should be screened.  Ask your health care provider whether you should take a calcium or vitamin D supplement to lower your risk for osteoporosis.  Menopause may have certain physical symptoms and risks.  Hormone replacement therapy may reduce some of these symptoms and  risks. Talk to your health care provider about whether hormone replacement therapy is right for you.  HOME CARE INSTRUCTIONS   Schedule regular health, dental, and eye exams.  Stay current with your immunizations.   Do not use any tobacco products including cigarettes, chewing tobacco, or electronic cigarettes.  If you are pregnant, do not drink alcohol.  If you are breastfeeding, limit how much and how often you drink alcohol.  Limit alcohol intake to no more than 1 drink per day for nonpregnant women. One drink equals 12 ounces of beer, 5 ounces of wine, or 1 ounces of hard liquor.  Do not use street drugs.  Do not share needles.  Ask your health care provider for help if you need support or information about quitting drugs.  Tell your health care provider if you often feel depressed.  Tell your health care provider if you have ever been abused or do not feel safe at home.   This information is not intended to replace advice given to you by your health care provider. Make sure you discuss any questions you have with your health care provider.   Document Released: 06/05/2011 Document Revised: 12/11/2014 Document Reviewed: 10/22/2013 Elsevier Interactive Patient Education [employer].

## 2016-03-29 NOTE — Progress Notes (Signed)
Pre visit review using our clinic review tool, if applicable. No additional management support is needed unless otherwise documented below in the visit note. 

## 2016-03-29 NOTE — Assessment & Plan Note (Signed)
Burning type pain that has been there for awhile, but getting worse Will try low dose gabapentin and titrate if needed and tolerated Discussed possible side effects
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
## 2016-03-29 NOTE — Assessment & Plan Note (Signed)
On chronic anticoagulation - continue  Will check INR today so she does not need to return next week when she is due

## 2016-03-30 ENCOUNTER — Other Ambulatory Visit: Payer: Self-pay | Admitting: Internal Medicine

## 2016-03-30 ENCOUNTER — Ambulatory Visit (INDEPENDENT_AMBULATORY_CARE_PROVIDER_SITE_OTHER): Payer: Medicare Other | Admitting: General Practice

## 2016-03-30 DIAGNOSIS — Z5181 Encounter for therapeutic drug level monitoring: Secondary | ICD-10-CM

## 2016-03-30 DIAGNOSIS — R7989 Other specified abnormal findings of blood chemistry: Secondary | ICD-10-CM | POA: Insufficient documentation

## 2016-03-30 NOTE — Progress Notes (Signed)
I agree with this plan.

## 2016-03-30 NOTE — Progress Notes (Signed)
Pre visit review using our clinic review tool, if applicable. No additional management support is needed unless otherwise documented below in the visit note. 

## 2016-03-31 ENCOUNTER — Telehealth: Payer: Self-pay | Admitting: Emergency Medicine

## 2016-03-31 DIAGNOSIS — S31829A Unspecified open wound of left buttock, initial encounter: Secondary | ICD-10-CM | POA: Diagnosis not present

## 2016-03-31 DIAGNOSIS — S91302A Unspecified open wound, left foot, initial encounter: Secondary | ICD-10-CM | POA: Diagnosis not present

## 2016-03-31 DIAGNOSIS — R339 Retention of urine, unspecified: Secondary | ICD-10-CM | POA: Diagnosis not present

## 2016-03-31 LAB — URINE CULTURE: Colony Count: 80000

## 2016-03-31 NOTE — Telephone Encounter (Signed)
RX for Valium Faxed to [facility]

## 2016-04-02 ENCOUNTER — Other Ambulatory Visit: Payer: Self-pay | Admitting: Internal Medicine

## 2016-04-02 MED ORDER — SULFAMETHOXAZOLE-TRIMETHOPRIM 800-160 MG PO TABS: 1.0000 | ORAL_TABLET | Freq: Two times a day (BID) | ORAL | Status: DC

## 2016-04-04 ENCOUNTER — Telehealth: Payer: Self-pay | Admitting: General Practice

## 2016-04-04 NOTE — Telephone Encounter (Signed)
Patient is taking Bactrim.  Instructed patient to take coumadin 2 mg daily and check INR in 1 week, Monday 5/8.  Patient verbalized understanding.

## 2016-04-10 ENCOUNTER — Telehealth: Payer: Self-pay | Admitting: Internal Medicine

## 2016-04-10 ENCOUNTER — Encounter (HOSPITAL_BASED_OUTPATIENT_CLINIC_OR_DEPARTMENT_OTHER): Payer: Medicare Other | Attending: Internal Medicine

## 2016-04-10 DIAGNOSIS — X58XXXA Exposure to other specified factors, initial encounter: Secondary | ICD-10-CM | POA: Insufficient documentation

## 2016-04-10 DIAGNOSIS — Z7901 Long term (current) use of anticoagulants: Secondary | ICD-10-CM | POA: Diagnosis not present

## 2016-04-10 DIAGNOSIS — S71101A Unspecified open wound, right thigh, initial encounter: Secondary | ICD-10-CM | POA: Insufficient documentation

## 2016-04-10 DIAGNOSIS — Z79899 Other long term (current) drug therapy: Secondary | ICD-10-CM | POA: Insufficient documentation

## 2016-04-10 DIAGNOSIS — L89624 Pressure ulcer of left heel, stage 4: Secondary | ICD-10-CM | POA: Insufficient documentation

## 2016-04-10 NOTE — Telephone Encounter (Signed)
Spoke with pt to inform.  

## 2016-04-10 NOTE — Telephone Encounter (Signed)
Pt called in and wants to know when is she suppose to come back to do recheck for her labs ?  The orders are already in but she is not sure when she was suppose to come back to do them?

## 2016-04-11 ENCOUNTER — Other Ambulatory Visit: Payer: Self-pay | Admitting: Internal Medicine

## 2016-04-11 ENCOUNTER — Ambulatory Visit (INDEPENDENT_AMBULATORY_CARE_PROVIDER_SITE_OTHER): Payer: Medicare Other | Admitting: General Practice

## 2016-04-11 ENCOUNTER — Ambulatory Visit (HOSPITAL_BASED_OUTPATIENT_CLINIC_OR_DEPARTMENT_OTHER)
Admission: RE | Admit: 2016-04-11 | Discharge: 2016-04-11 | Disposition: A | Discharge status: Home / Self Care | Payer: Medicare Other | Source: Ambulatory Visit | Attending: Internal Medicine | Admitting: Internal Medicine

## 2016-04-11 DIAGNOSIS — Z5181 Encounter for therapeutic drug level monitoring: Secondary | ICD-10-CM

## 2016-04-11 DIAGNOSIS — L97329 Non-pressure chronic ulcer of left ankle with unspecified severity: Secondary | ICD-10-CM | POA: Diagnosis not present

## 2016-04-11 DIAGNOSIS — L8994 Pressure ulcer of unspecified site, stage 4: Secondary | ICD-10-CM

## 2016-04-11 LAB — POCT INR: INR: 2.6

## 2016-04-11 NOTE — Progress Notes (Signed)
Pre visit review using our clinic review tool, if applicable. No additional management support is needed unless otherwise documented below in the visit note. 

## 2016-04-12 NOTE — Progress Notes (Signed)
I agree with this plan.

## 2016-04-24 DIAGNOSIS — L89524 Pressure ulcer of left ankle, stage 4: Secondary | ICD-10-CM | POA: Diagnosis not present

## 2016-04-24 DIAGNOSIS — H2513 Age-related nuclear cataract, bilateral: Secondary | ICD-10-CM | POA: Diagnosis not present

## 2016-04-24 DIAGNOSIS — H31093 Other chorioretinal scars, bilateral: Secondary | ICD-10-CM | POA: Diagnosis not present

## 2016-04-24 DIAGNOSIS — L89613 Pressure ulcer of right heel, stage 3: Secondary | ICD-10-CM | POA: Diagnosis not present

## 2016-04-24 DIAGNOSIS — S71101A Unspecified open wound, right thigh, initial encounter: Secondary | ICD-10-CM | POA: Diagnosis not present

## 2016-04-24 DIAGNOSIS — D3132 Benign neoplasm of left choroid: Secondary | ICD-10-CM | POA: Diagnosis not present

## 2016-04-24 DIAGNOSIS — Z7901 Long term (current) use of anticoagulants: Secondary | ICD-10-CM | POA: Diagnosis not present

## 2016-04-24 DIAGNOSIS — S81801A Unspecified open wound, right lower leg, initial encounter: Secondary | ICD-10-CM | POA: Diagnosis not present

## 2016-04-24 DIAGNOSIS — Z79899 Other long term (current) drug therapy: Secondary | ICD-10-CM | POA: Diagnosis not present

## 2016-04-24 DIAGNOSIS — H53001 Unspecified amblyopia, right eye: Secondary | ICD-10-CM | POA: Diagnosis not present

## 2016-04-24 DIAGNOSIS — L89624 Pressure ulcer of left heel, stage 4: Secondary | ICD-10-CM | POA: Diagnosis not present

## 2016-04-25 ENCOUNTER — Encounter: Payer: Self-pay | Admitting: Internal Medicine

## 2016-04-26 ENCOUNTER — Other Ambulatory Visit: Payer: Self-pay | Admitting: Internal Medicine

## 2016-05-03 DIAGNOSIS — R339 Retention of urine, unspecified: Secondary | ICD-10-CM | POA: Diagnosis not present

## 2016-05-03 DIAGNOSIS — S91302A Unspecified open wound, left foot, initial encounter: Secondary | ICD-10-CM | POA: Diagnosis not present

## 2016-05-03 DIAGNOSIS — S31829A Unspecified open wound of left buttock, initial encounter: Secondary | ICD-10-CM | POA: Diagnosis not present

## 2016-05-08 ENCOUNTER — Ambulatory Visit (INDEPENDENT_AMBULATORY_CARE_PROVIDER_SITE_OTHER): Payer: Medicare Other | Admitting: General Practice

## 2016-05-08 DIAGNOSIS — Z5181 Encounter for therapeutic drug level monitoring: Secondary | ICD-10-CM

## 2016-05-08 LAB — POCT INR: INR: 3.3

## 2016-05-08 NOTE — Progress Notes (Signed)
Pre visit review using our clinic review tool, if applicable. No additional management support is needed unless otherwise documented below in the visit note. 

## 2016-05-09 NOTE — Progress Notes (Signed)
I agree with this plan.

## 2016-05-11 ENCOUNTER — Telehealth: Payer: Self-pay | Admitting: Internal Medicine

## 2016-05-11 NOTE — Telephone Encounter (Signed)
Pt called in stated that she uti has not gotten better since the last time she was here.  She wants to know what she needs to do.  She wanted to speak to nurse

## 2016-05-12 MED ORDER — CIPROFLOXACIN HCL 250 MG PO TABS: 250.0000 mg | ORAL_TABLET | Freq: Two times a day (BID) | ORAL | Status: DC

## 2016-05-12 NOTE — Telephone Encounter (Signed)
Spoke with pt to inform.  

## 2016-05-12 NOTE — Telephone Encounter (Signed)
Please advise 

## 2016-05-12 NOTE — Telephone Encounter (Signed)
Lets try cipro for 5 days.  This does interact with the warfarin and may increase her risk of bleeding.  If she still has symptoms after this she needs to give a urine sample so we can check it

## 2016-05-16 DIAGNOSIS — I82409 Acute embolism and thrombosis of unspecified deep veins of unspecified lower extremity: Secondary | ICD-10-CM | POA: Diagnosis not present

## 2016-05-22 ENCOUNTER — Encounter (HOSPITAL_BASED_OUTPATIENT_CLINIC_OR_DEPARTMENT_OTHER): Payer: Medicare Other | Attending: Internal Medicine

## 2016-05-22 ENCOUNTER — Other Ambulatory Visit (INDEPENDENT_AMBULATORY_CARE_PROVIDER_SITE_OTHER): Payer: Medicare Other

## 2016-05-22 DIAGNOSIS — S71101A Unspecified open wound, right thigh, initial encounter: Secondary | ICD-10-CM | POA: Diagnosis not present

## 2016-05-22 DIAGNOSIS — R7989 Other specified abnormal findings of blood chemistry: Secondary | ICD-10-CM

## 2016-05-22 DIAGNOSIS — L89624 Pressure ulcer of left heel, stage 4: Secondary | ICD-10-CM | POA: Insufficient documentation

## 2016-05-22 DIAGNOSIS — Z7901 Long term (current) use of anticoagulants: Secondary | ICD-10-CM | POA: Insufficient documentation

## 2016-05-22 DIAGNOSIS — Z79899 Other long term (current) drug therapy: Secondary | ICD-10-CM | POA: Insufficient documentation

## 2016-05-22 DIAGNOSIS — L89613 Pressure ulcer of right heel, stage 3: Secondary | ICD-10-CM | POA: Diagnosis not present

## 2016-05-22 DIAGNOSIS — R946 Abnormal results of thyroid function studies: Secondary | ICD-10-CM | POA: Diagnosis not present

## 2016-05-22 DIAGNOSIS — X58XXXA Exposure to other specified factors, initial encounter: Secondary | ICD-10-CM | POA: Insufficient documentation

## 2016-05-22 LAB — TSH: TSH: 6.03 u[IU]/mL — ABNORMAL HIGH (ref 0.35–4.50)

## 2016-05-22 LAB — T4, FREE: FREE T4: 1.05 ng/dL (ref 0.60–1.60)

## 2016-05-22 MED FILL — DOXYCYCLINE HYCLATE 100 MG: 100 | 10 days supply | Qty: 20 | Fill #0

## 2016-05-23 ENCOUNTER — Ambulatory Visit (INDEPENDENT_AMBULATORY_CARE_PROVIDER_SITE_OTHER): Payer: Medicare Other | Admitting: General Practice

## 2016-05-23 ENCOUNTER — Encounter: Payer: Self-pay | Admitting: Internal Medicine

## 2016-05-23 DIAGNOSIS — Z5181 Encounter for therapeutic drug level monitoring: Secondary | ICD-10-CM

## 2016-05-23 LAB — THYROID ANTIBODIES: Thyroperoxidase Ab SerPl-aCnc: 1 IU/mL (ref <9)

## 2016-05-23 LAB — POCT INR: INR: 2

## 2016-05-23 NOTE — Progress Notes (Signed)
Pre visit review using our clinic review tool, if applicable. No additional management support is needed unless otherwise documented below in the visit note. 

## 2016-05-24 ENCOUNTER — Telehealth: Payer: Self-pay | Admitting: Internal Medicine

## 2016-05-24 DIAGNOSIS — E038 Other specified hypothyroidism: Secondary | ICD-10-CM

## 2016-05-24 NOTE — Telephone Encounter (Signed)
Please advise 

## 2016-05-24 NOTE — Telephone Encounter (Signed)
Ok, we can start medication if she likes - we will start at the lowest dose - she needs to take it first thing in the morning 1 hr before food or at night if she does not eat after dinner.  No vitamins w/in three hours. We will recheck her tsh in 2 months. If she agrees let me know

## 2016-05-24 NOTE — Telephone Encounter (Signed)
Pt called in and said that she is having really bad side effect of her thyroid meds. Her Hair is falling out and she can not go to the bathroom

## 2016-05-25 DIAGNOSIS — E039 Hypothyroidism, unspecified: Secondary | ICD-10-CM | POA: Insufficient documentation

## 2016-05-25 MED ORDER — LEVOTHYROXINE SODIUM 25 MCG PO TABS: 25.0000 ug | ORAL_TABLET | Freq: Every day | ORAL | Status: DC

## 2016-05-25 NOTE — Telephone Encounter (Signed)
[facility]othyroxine sent to [facility].  tsh ordered - to be done in 2 months.  Should try a laxative such as miralax - take daily as needed.  It is otc.

## 2016-05-25 NOTE — Telephone Encounter (Signed)
Spoke with pt, she is okay with starting new medication. Pt is also concerned that she has not had a good bowel movement since the 7th of June. Pt was recently given an antibiotic for an infection on heel. Please advise.

## 2016-05-25 NOTE — Progress Notes (Signed)
I agree with this plan.

## 2016-05-30 ENCOUNTER — Ambulatory Visit (INDEPENDENT_AMBULATORY_CARE_PROVIDER_SITE_OTHER): Payer: Medicare Other | Admitting: General Practice

## 2016-05-30 DIAGNOSIS — Z5181 Encounter for therapeutic drug level monitoring: Secondary | ICD-10-CM

## 2016-05-30 DIAGNOSIS — S31829A Unspecified open wound of left buttock, initial encounter: Secondary | ICD-10-CM | POA: Diagnosis not present

## 2016-05-30 DIAGNOSIS — S91302A Unspecified open wound, left foot, initial encounter: Secondary | ICD-10-CM | POA: Diagnosis not present

## 2016-05-30 LAB — POCT INR: INR: 2.6

## 2016-05-30 NOTE — Progress Notes (Signed)
I agree with this plan.

## 2016-05-30 NOTE — Progress Notes (Signed)
Pre visit review using our clinic review tool, if applicable. No additional management support is needed unless otherwise documented below in the visit note. 

## 2016-05-30 NOTE — Telephone Encounter (Signed)
Spoke with pt to inform.  

## 2016-05-31 DIAGNOSIS — S91302A Unspecified open wound, left foot, initial encounter: Secondary | ICD-10-CM | POA: Diagnosis not present

## 2016-05-31 DIAGNOSIS — R339 Retention of urine, unspecified: Secondary | ICD-10-CM | POA: Diagnosis not present

## 2016-05-31 DIAGNOSIS — S31829A Unspecified open wound of left buttock, initial encounter: Secondary | ICD-10-CM | POA: Diagnosis not present

## 2016-06-05 ENCOUNTER — Encounter (HOSPITAL_BASED_OUTPATIENT_CLINIC_OR_DEPARTMENT_OTHER): Payer: Medicare Other | Attending: Internal Medicine

## 2016-06-05 DIAGNOSIS — L89613 Pressure ulcer of right heel, stage 3: Secondary | ICD-10-CM | POA: Diagnosis not present

## 2016-06-05 DIAGNOSIS — L98491 Non-pressure chronic ulcer of skin of other sites limited to breakdown of skin: Secondary | ICD-10-CM | POA: Diagnosis not present

## 2016-06-05 DIAGNOSIS — L89524 Pressure ulcer of left ankle, stage 4: Secondary | ICD-10-CM | POA: Insufficient documentation

## 2016-06-07 DIAGNOSIS — L89892 Pressure ulcer of other site, stage 2: Secondary | ICD-10-CM | POA: Diagnosis not present

## 2016-06-07 DIAGNOSIS — L89624 Pressure ulcer of left heel, stage 4: Secondary | ICD-10-CM | POA: Diagnosis not present

## 2016-06-12 ENCOUNTER — Other Ambulatory Visit: Payer: Self-pay | Admitting: Internal Medicine

## 2016-06-15 DIAGNOSIS — R339 Retention of urine, unspecified: Secondary | ICD-10-CM | POA: Diagnosis not present

## 2016-06-15 DIAGNOSIS — S31829A Unspecified open wound of left buttock, initial encounter: Secondary | ICD-10-CM | POA: Diagnosis not present

## 2016-06-15 DIAGNOSIS — S91302A Unspecified open wound, left foot, initial encounter: Secondary | ICD-10-CM | POA: Diagnosis not present

## 2016-06-20 ENCOUNTER — Ambulatory Visit (INDEPENDENT_AMBULATORY_CARE_PROVIDER_SITE_OTHER): Payer: Medicare Other | Admitting: General Practice

## 2016-06-20 DIAGNOSIS — Z5181 Encounter for therapeutic drug level monitoring: Secondary | ICD-10-CM

## 2016-06-20 LAB — POCT INR: INR: 2.6

## 2016-06-20 NOTE — Progress Notes (Signed)
I agree with this plan.

## 2016-06-20 NOTE — Progress Notes (Signed)
Pre visit review using our clinic review tool, if applicable. No additional management support is needed unless otherwise documented below in the visit note. 

## 2016-06-21 ENCOUNTER — Telehealth: Payer: Self-pay | Admitting: Internal Medicine

## 2016-06-21 NOTE — Telephone Encounter (Signed)
Hold coumadin today and tomorrow.  Reheck inr on Friday if possible.  If blood from catheter does not stop or there are too many clots she will need to see urology.

## 2016-06-21 NOTE — Telephone Encounter (Signed)
Spoke with pt to inform.  

## 2016-06-21 NOTE — Telephone Encounter (Signed)
Please advise 

## 2016-06-21 NOTE — Telephone Encounter (Signed)
Patient Name: [name]  DOB: 1956/03/11    Initial Comment Caller says she has a lot of blood in Catheter Urine, and in stool this morning, Her INR was 3.4 this morning.    Nurse Assessment  Nurse: Raphael Gibney, RN, Vanita Ingles Date/Time (Eastern Time): 06/21/2016 10:47:49 AM  Confirm and document reason for call. If symptomatic, describe symptoms. You must click the next button to save text entered. ---Caller states she has a suprapubic catheter that has a lot of blood in her urine. Had a yeast infection and she took Diflucan on Saturday over a week ago. INR on Monday 2.6 and she had held her Coumadin. She resumed her Coumadin she thinks on Monday. Her normal dose is 4 mg every day except 2 mg on Monday and Friday. She had blood in her stool this am. INR 3.3 today. She passed out on Friday. She was instructed by Jenny Reichmann to hold coumadin. Nurse will be back on Friday.  Has the patient traveled out of the country within the last 30 days? ---Not Applicable  Does the patient have any new or worsening symptoms? ---Yes  Will a triage be completed? ---Yes  Related visit to physician within the last 2 weeks? ---No  Does the PT have any chronic conditions? (i.e. diabetes, asthma, etc.) ---Yes  List chronic conditions. ---spinal cord injury; hypothyroidism  Is this a behavioral health or substance abuse call? ---No     Guidelines    Guideline Title Affirmed Question Affirmed Notes  Rectal Bleeding Taking Coumadin (warfarin) or other strong blood thinner, or known bleeding disorder (e.g., thrombocytopenia)    Final Disposition User   Go to ED Now (or PCP triage) Raphael Gibney, RN, Vera    Comments  Pt states she has already been instructed to hold her coumadin by [facility].  pt does not want to go to the ER  Called back line and spoke to [facility] and gave report that pt has a suprapubic catheter that has a lot of blood in her urine. Had a yeast infection and she took Diflucan on Saturday over a week ago. INR on Monday  2.6 and she had held her Coumadin. She resumed her Coumadin she thinks on Monday. Her normal dose is 4 mg every day except 2 mg on Monday and Friday. She had blood in her stool this am. INR 3.3 today. She passed out on Friday. She was instructed by Jenny Reichmann to hold coumadin. Nurse will be back on Friday. Triage outcome got to ER (or PCP triage) and pt does not want to go.   Referrals  GO TO FACILITY REFUSED   Disagree/Comply: Disagree  Disagree/Comply Reason: Disagree with instructions

## 2016-06-22 NOTE — Telephone Encounter (Signed)
Yes.  Thank you.

## 2016-06-23 ENCOUNTER — Telehealth: Payer: Self-pay | Admitting: Internal Medicine

## 2016-06-23 DIAGNOSIS — R350 Frequency of micturition: Secondary | ICD-10-CM

## 2016-06-23 MED ORDER — CIPROFLOXACIN HCL 250 MG PO TABS: 250.0000 mg | ORAL_TABLET | Freq: Two times a day (BID) | ORAL | Status: DC

## 2016-06-23 NOTE — Telephone Encounter (Signed)
Resume coumadin if she is not bleeding.  cipro sent to [facility]

## 2016-06-23 NOTE — Telephone Encounter (Signed)
Spoke with pt. She would rather have Culture done before taking antibiotic.

## 2016-06-23 NOTE — Telephone Encounter (Signed)
Pt called in with results of INR  2.5 -INR  She also wants an order faxed over to take care of a UTI

## 2016-06-23 NOTE — Telephone Encounter (Signed)
PLeae advise on UTI

## 2016-06-26 ENCOUNTER — Other Ambulatory Visit (INDEPENDENT_AMBULATORY_CARE_PROVIDER_SITE_OTHER): Payer: Medicare Other

## 2016-06-26 DIAGNOSIS — L89892 Pressure ulcer of other site, stage 2: Secondary | ICD-10-CM | POA: Diagnosis not present

## 2016-06-26 DIAGNOSIS — R350 Frequency of micturition: Secondary | ICD-10-CM

## 2016-06-26 DIAGNOSIS — R339 Retention of urine, unspecified: Secondary | ICD-10-CM | POA: Diagnosis not present

## 2016-06-26 DIAGNOSIS — L89624 Pressure ulcer of left heel, stage 4: Secondary | ICD-10-CM | POA: Diagnosis not present

## 2016-06-26 LAB — URINALYSIS, ROUTINE W REFLEX MICROSCOPIC
Bilirubin Urine: NEGATIVE
Ketones, ur: NEGATIVE
NITRITE: NEGATIVE
PH: 8.5 — AB (ref 5.0–8.0)
SPECIFIC GRAVITY, URINE: 1.01 (ref 1.000–1.030)
TOTAL PROTEIN, URINE-UPE24: NEGATIVE
Urine Glucose: NEGATIVE
Urobilinogen, UA: 0.2 (ref 0.0–1.0)

## 2016-06-28 ENCOUNTER — Telehealth: Payer: Self-pay | Admitting: Internal Medicine

## 2016-06-28 LAB — URINE CULTURE

## 2016-06-28 NOTE — Telephone Encounter (Signed)
Patient called to check on results of UA- states that she has not taken any cipro because she wanted to make sure it was appropriate for the infection. Advised no interpretation listed yet.

## 2016-06-29 NOTE — Telephone Encounter (Signed)
Spoke with pt to inform.  

## 2016-07-03 ENCOUNTER — Telehealth: Payer: Self-pay | Admitting: *Deleted

## 2016-07-03 DIAGNOSIS — L89624 Pressure ulcer of left heel, stage 4: Secondary | ICD-10-CM | POA: Diagnosis not present

## 2016-07-03 DIAGNOSIS — I82409 Acute embolism and thrombosis of unspecified deep veins of unspecified lower extremity: Secondary | ICD-10-CM | POA: Diagnosis not present

## 2016-07-03 DIAGNOSIS — L89892 Pressure ulcer of other site, stage 2: Secondary | ICD-10-CM | POA: Diagnosis not present

## 2016-07-03 DIAGNOSIS — S31829A Unspecified open wound of left buttock, initial encounter: Secondary | ICD-10-CM | POA: Diagnosis not present

## 2016-07-03 DIAGNOSIS — S91302A Unspecified open wound, left foot, initial encounter: Secondary | ICD-10-CM | POA: Diagnosis not present

## 2016-07-03 MED ORDER — DIAZEPAM 5 MG PO TABS
5.0000 mg | ORAL_TABLET | Freq: Two times a day (BID) | ORAL | 2 refills | Status: DC
Start: 2016-07-03 — End: 2016-09-26

## 2016-07-03 NOTE — Telephone Encounter (Signed)
Rec'd fax pt requesting refill on her Diazepam 5 mg. Last filled 05/30/16...[name]

## 2016-07-03 NOTE — Telephone Encounter (Signed)
RX faxed to [facility] 

## 2016-07-03 NOTE — Telephone Encounter (Signed)
rx printed

## 2016-07-04 DIAGNOSIS — R339 Retention of urine, unspecified: Secondary | ICD-10-CM | POA: Diagnosis not present

## 2016-07-05 ENCOUNTER — Telehealth: Payer: Self-pay | Admitting: *Deleted

## 2016-07-05 MED ORDER — SILVER SULFADIAZINE 1 % EX CREA: 1.0000 "application " | TOPICAL_CREAM | Freq: Two times a day (BID) | CUTANEOUS | 0 refills | Status: DC | PRN

## 2016-07-05 NOTE — Telephone Encounter (Signed)
Ok to fill. Thanks

## 2016-07-05 NOTE — Telephone Encounter (Signed)
Notified pt md ok refill has been sent to [facility]...[name]

## 2016-07-05 NOTE — Telephone Encounter (Signed)
Pt left msg on triage stating she is needing to get a refill on this silver sulfadine cream. Med is not on med list but pt states she has to use it from time to time. Is this ok to refill...[name]

## 2016-07-12 ENCOUNTER — Ambulatory Visit (INDEPENDENT_AMBULATORY_CARE_PROVIDER_SITE_OTHER): Payer: Medicare Other | Admitting: General Practice

## 2016-07-12 DIAGNOSIS — Z5181 Encounter for therapeutic drug level monitoring: Secondary | ICD-10-CM

## 2016-07-12 LAB — POCT INR: INR: 3

## 2016-07-12 NOTE — Progress Notes (Signed)
I have reviewed and agree with the plan. 

## 2016-07-17 ENCOUNTER — Telehealth: Payer: Self-pay | Admitting: Emergency Medicine

## 2016-07-17 DIAGNOSIS — L89624 Pressure ulcer of left heel, stage 4: Secondary | ICD-10-CM | POA: Diagnosis not present

## 2016-07-17 DIAGNOSIS — L89892 Pressure ulcer of other site, stage 2: Secondary | ICD-10-CM | POA: Diagnosis not present

## 2016-07-17 DIAGNOSIS — L89612 Pressure ulcer of right heel, stage 2: Secondary | ICD-10-CM | POA: Diagnosis not present

## 2016-07-17 DIAGNOSIS — L089 Local infection of the skin and subcutaneous tissue, unspecified: Secondary | ICD-10-CM | POA: Diagnosis not present

## 2016-07-17 DIAGNOSIS — G8250 Quadriplegia, unspecified: Secondary | ICD-10-CM | POA: Diagnosis not present

## 2016-07-17 DIAGNOSIS — Z7901 Long term (current) use of anticoagulants: Secondary | ICD-10-CM | POA: Diagnosis not present

## 2016-07-17 DIAGNOSIS — R339 Retention of urine, unspecified: Secondary | ICD-10-CM | POA: Diagnosis not present

## 2016-07-17 DIAGNOSIS — L89622 Pressure ulcer of left heel, stage 2: Secondary | ICD-10-CM | POA: Diagnosis not present

## 2016-07-17 NOTE — Telephone Encounter (Signed)
Received message from [employer], stating [name] was needing verbals for would care supplies : Tape and gloves. Called to give verbal, they will send paper orders for MD to sign.

## 2016-07-18 DIAGNOSIS — L89612 Pressure ulcer of right heel, stage 2: Secondary | ICD-10-CM | POA: Diagnosis not present

## 2016-07-21 ENCOUNTER — Telehealth: Payer: Self-pay | Admitting: General Practice

## 2016-07-21 NOTE — Telephone Encounter (Signed)
Patient called to let me know she is taking Bactrim for 7 days.  I instructed pt to take 2 mg of coumadin through Monday and to check INR on Tuesday and call me with the results.  Patient verbalized understanding.

## 2016-07-24 ENCOUNTER — Telehealth: Payer: Self-pay | Admitting: Internal Medicine

## 2016-07-24 DIAGNOSIS — R339 Retention of urine, unspecified: Secondary | ICD-10-CM | POA: Diagnosis not present

## 2016-07-24 NOTE — Telephone Encounter (Signed)
Patient states it is hard for her to get out.  She is would like to know if Dr. Quay Burow can send orders to [facility] for her thyroid testing?

## 2016-07-24 NOTE — Telephone Encounter (Signed)
Please advise 

## 2016-07-24 NOTE — Telephone Encounter (Signed)
ok 

## 2016-07-25 ENCOUNTER — Ambulatory Visit (INDEPENDENT_AMBULATORY_CARE_PROVIDER_SITE_OTHER): Payer: Medicare Other | Admitting: General Practice

## 2016-07-25 DIAGNOSIS — Z5181 Encounter for therapeutic drug level monitoring: Secondary | ICD-10-CM

## 2016-07-25 LAB — POCT INR: INR: 3.7

## 2016-07-25 NOTE — Telephone Encounter (Signed)
Pt called and stated she would like to have the thyroid testing done at [facility]. They havent received anything on it. She asked if somebody can call and give [facility] verbal orders or fax something over so she can get that done. She also asked to let her know when this is done so she knows when to go. Thanks.

## 2016-07-26 NOTE — Telephone Encounter (Signed)
Patient called to check up, stating that [facility] has not gotten anything from [place] yet. Please follow up

## 2016-07-26 NOTE — Telephone Encounter (Signed)
Sent to pharmacy 

## 2016-07-26 NOTE — Telephone Encounter (Signed)
SPoke with staff from [facility] urgent care, they will need an order faxed over, they can not take verbal orders for labs. A written RX is okay to send.   Fax [phone]

## 2016-07-27 DIAGNOSIS — E039 Hypothyroidism, unspecified: Secondary | ICD-10-CM | POA: Diagnosis not present

## 2016-07-27 DIAGNOSIS — Z0000 Encounter for general adult medical examination without abnormal findings: Secondary | ICD-10-CM | POA: Diagnosis not present

## 2016-07-27 NOTE — Telephone Encounter (Signed)
Orders have been faxed again. Tried contacting pt, unable to LVM

## 2016-07-27 NOTE — Telephone Encounter (Signed)
Orders were sent to Urgent Care 8/23 by Corrine.

## 2016-07-27 NOTE — Progress Notes (Signed)
I have reviewed and agree with the plan.  Binnie Rail, MD

## 2016-07-27 NOTE — Telephone Encounter (Signed)
Patient is requesting lab order to be sent again.  States that [facility] does not have.  Is requesting one to be mailed as well.  Please follow up with patient.

## 2016-07-27 NOTE — Telephone Encounter (Signed)
Patient is requesting order to be faxed to her at her phone number instead of mailed.

## 2016-07-28 LAB — TSH: TSH: 4.17 u[IU]/mL (ref 0.41–5.90)

## 2016-07-29 ENCOUNTER — Telehealth: Payer: Self-pay | Admitting: Internal Medicine

## 2016-07-29 NOTE — Telephone Encounter (Signed)
Her thyroid function is in the normal range and she should continue her current dose of medication.

## 2016-07-30 IMAGING — DX DG ANKLE COMPLETE 3+V*L*
3 series · 3 of 3 positions shown · non-contrast
Comparison: 06/12/2013.

CLINICAL DATA: LEFT ankle stage IV pressure ulcer.  Paraplegia.

EXAM:
LEFT ANKLE COMPLETE - 3+ VIEW

[ankle ap]
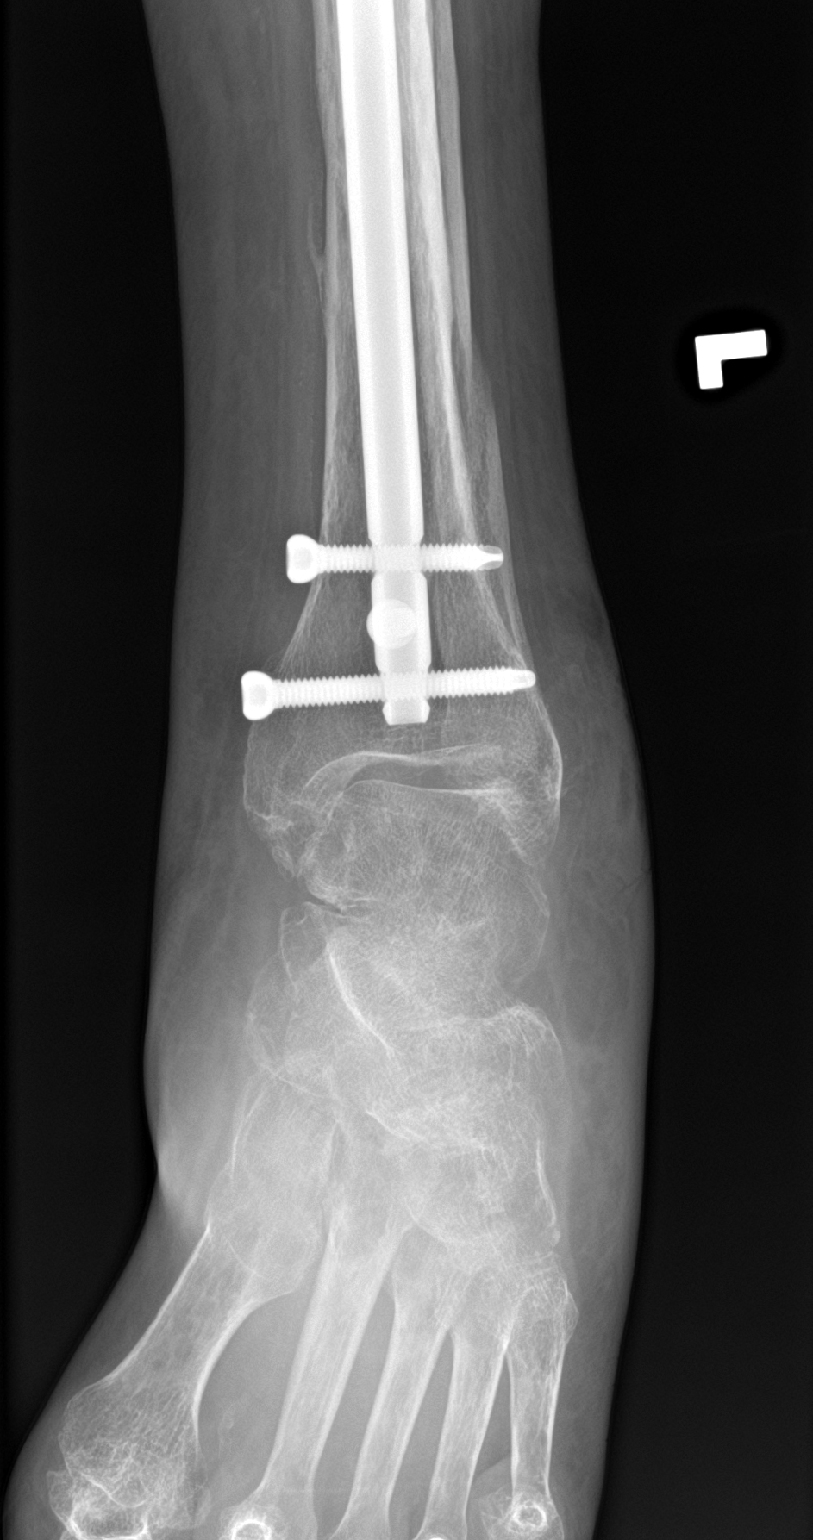

[ankle obl]
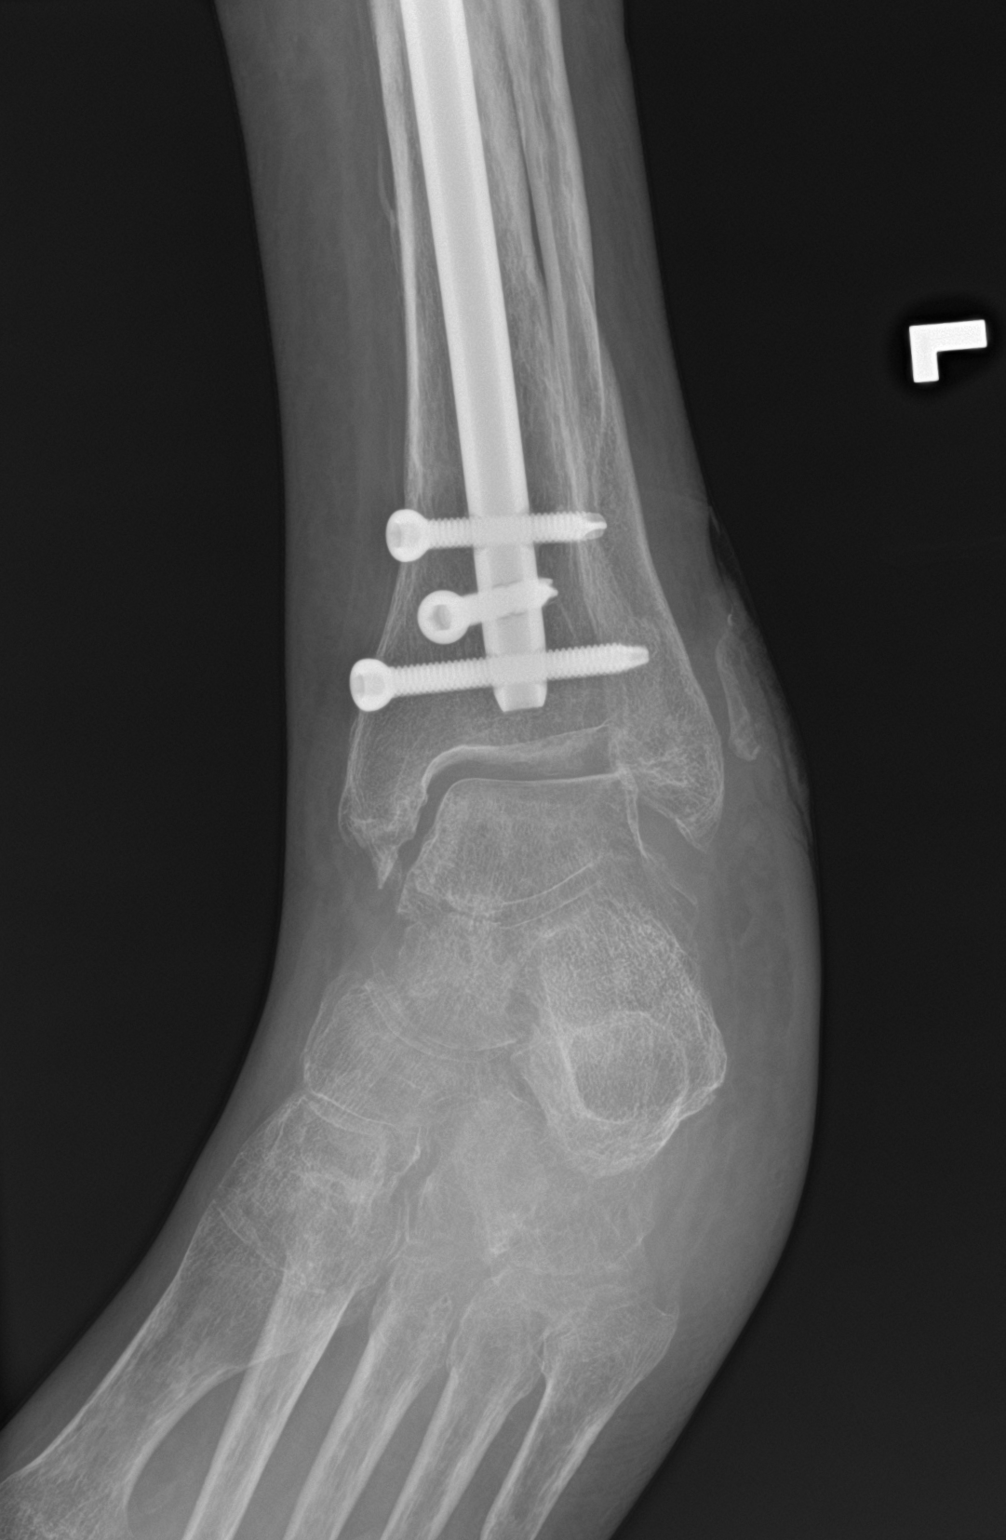

[ankle lat]
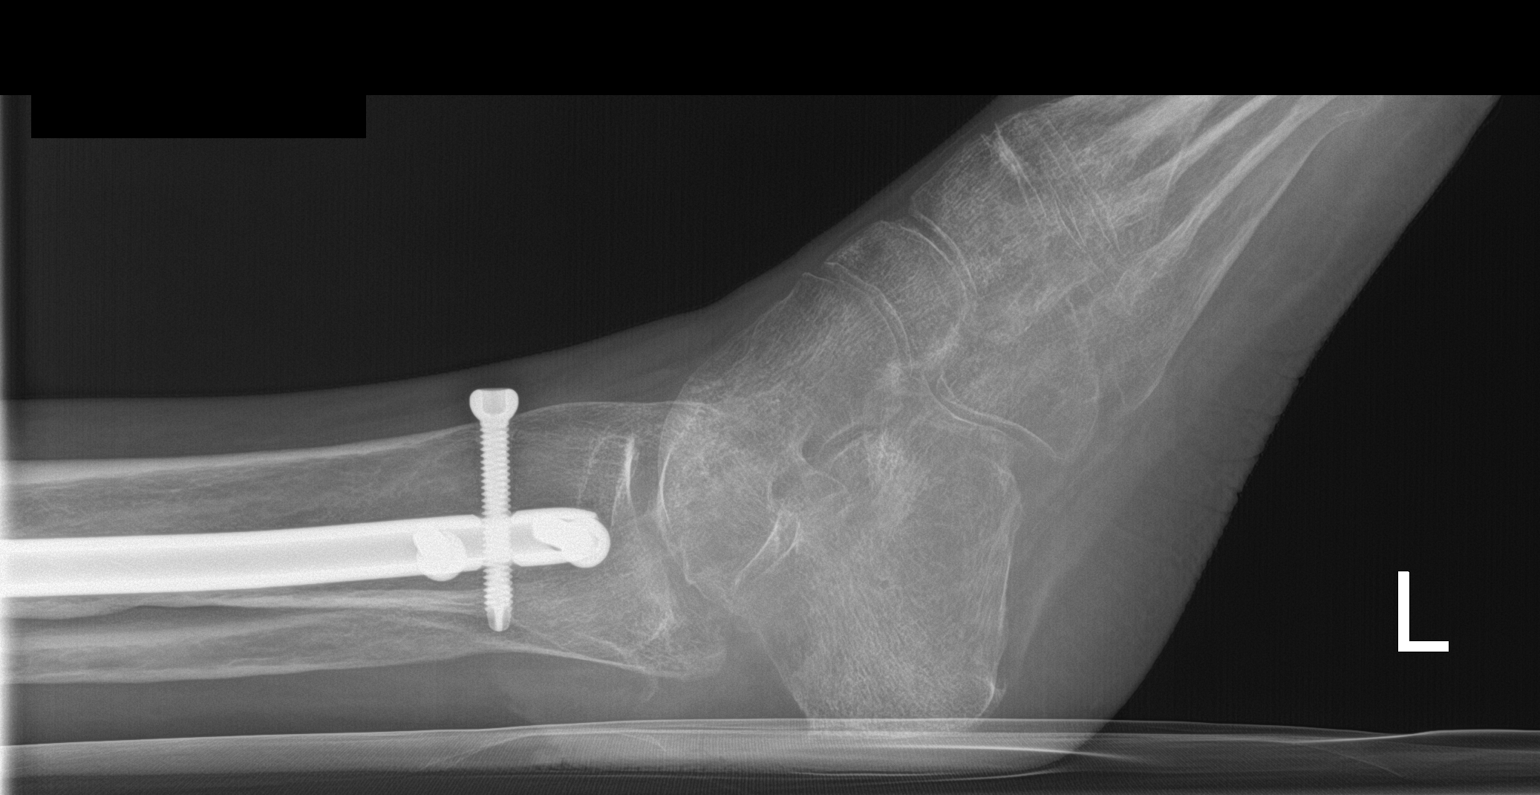

[3 of 3 positions shown; findings below may reference images not displayed]

FINDINGS: Intramedullary rod has been placed across a tibial fracture. There
is no loosening of the distal screws. Severe osteopenia is due to
paraplegia. There is diffuse soft tissue swelling. Air in the
subcutaneous soft tissues can be seen under the ulcer. On the
oblique view there is a rectangular density overlying the soft
tissues laterally which could represent either a bandage or soft
tissue ossification. Degenerative changes noted at the ankle mortise
with spurring from the medial malleolus. No definite findings of
osteomyelitis.
IMPRESSION: Lateral soft tissue ulceration as described. Subcutaneous air. No
findings suggestive of osteomyelitis are definitely identified.

## 2016-07-31 ENCOUNTER — Other Ambulatory Visit: Payer: Self-pay | Admitting: Internal Medicine

## 2016-07-31 ENCOUNTER — Encounter: Payer: Self-pay | Admitting: Internal Medicine

## 2016-07-31 NOTE — Telephone Encounter (Signed)
Spoke with pt to inform.  

## 2016-08-14 DIAGNOSIS — L89892 Pressure ulcer of other site, stage 2: Secondary | ICD-10-CM | POA: Diagnosis not present

## 2016-08-14 DIAGNOSIS — G8250 Quadriplegia, unspecified: Secondary | ICD-10-CM | POA: Diagnosis not present

## 2016-08-14 DIAGNOSIS — L89622 Pressure ulcer of left heel, stage 2: Secondary | ICD-10-CM | POA: Diagnosis not present

## 2016-08-14 DIAGNOSIS — Z7901 Long term (current) use of anticoagulants: Secondary | ICD-10-CM | POA: Diagnosis not present

## 2016-08-14 DIAGNOSIS — L89612 Pressure ulcer of right heel, stage 2: Secondary | ICD-10-CM | POA: Diagnosis not present

## 2016-08-14 DIAGNOSIS — L089 Local infection of the skin and subcutaneous tissue, unspecified: Secondary | ICD-10-CM | POA: Diagnosis not present

## 2016-08-15 DIAGNOSIS — L89612 Pressure ulcer of right heel, stage 2: Secondary | ICD-10-CM | POA: Diagnosis not present

## 2016-08-23 ENCOUNTER — Ambulatory Visit (INDEPENDENT_AMBULATORY_CARE_PROVIDER_SITE_OTHER): Payer: Medicare Other | Admitting: General Practice

## 2016-08-23 DIAGNOSIS — Z5181 Encounter for therapeutic drug level monitoring: Secondary | ICD-10-CM

## 2016-08-23 DIAGNOSIS — L89612 Pressure ulcer of right heel, stage 2: Secondary | ICD-10-CM | POA: Diagnosis not present

## 2016-08-23 LAB — POCT INR: INR: 1.9

## 2016-08-23 NOTE — Progress Notes (Signed)
I agree with this plan.

## 2016-08-28 ENCOUNTER — Other Ambulatory Visit: Payer: Self-pay | Admitting: Internal Medicine

## 2016-08-28 ENCOUNTER — Ambulatory Visit: Payer: Self-pay | Admitting: General Practice

## 2016-08-28 DIAGNOSIS — Z5181 Encounter for therapeutic drug level monitoring: Secondary | ICD-10-CM

## 2016-08-28 LAB — POCT INR: INR: 2.8

## 2016-08-29 DIAGNOSIS — R339 Retention of urine, unspecified: Secondary | ICD-10-CM | POA: Diagnosis not present

## 2016-08-31 DIAGNOSIS — R339 Retention of urine, unspecified: Secondary | ICD-10-CM | POA: Diagnosis not present

## 2016-09-01 ENCOUNTER — Other Ambulatory Visit: Payer: Self-pay | Admitting: General Practice

## 2016-09-01 ENCOUNTER — Ambulatory Visit: Payer: Self-pay | Admitting: General Practice

## 2016-09-01 DIAGNOSIS — Z5181 Encounter for therapeutic drug level monitoring: Secondary | ICD-10-CM

## 2016-09-01 LAB — POCT INR: INR: 2.7

## 2016-09-01 MED ORDER — WARFARIN SODIUM 4 MG PO TABS: ORAL_TABLET | ORAL | 1 refills | Status: DC

## 2016-09-01 MED ORDER — WARFARIN SODIUM 2 MG PO TABS: ORAL_TABLET | ORAL | 1 refills | Status: DC

## 2016-09-01 NOTE — Progress Notes (Signed)
I have reviewed and agree with the plan. 

## 2016-09-11 DIAGNOSIS — Z7901 Long term (current) use of anticoagulants: Secondary | ICD-10-CM | POA: Diagnosis not present

## 2016-09-11 DIAGNOSIS — L89892 Pressure ulcer of other site, stage 2: Secondary | ICD-10-CM | POA: Diagnosis not present

## 2016-09-11 DIAGNOSIS — L89612 Pressure ulcer of right heel, stage 2: Secondary | ICD-10-CM | POA: Diagnosis not present

## 2016-09-11 DIAGNOSIS — G8250 Quadriplegia, unspecified: Secondary | ICD-10-CM | POA: Diagnosis not present

## 2016-09-11 DIAGNOSIS — L89622 Pressure ulcer of left heel, stage 2: Secondary | ICD-10-CM | POA: Diagnosis not present

## 2016-09-12 DIAGNOSIS — L89612 Pressure ulcer of right heel, stage 2: Secondary | ICD-10-CM | POA: Diagnosis not present

## 2016-09-19 DIAGNOSIS — L89612 Pressure ulcer of right heel, stage 2: Secondary | ICD-10-CM | POA: Diagnosis not present

## 2016-09-25 ENCOUNTER — Ambulatory Visit (INDEPENDENT_AMBULATORY_CARE_PROVIDER_SITE_OTHER): Payer: Self-pay

## 2016-09-25 DIAGNOSIS — Z5181 Encounter for therapeutic drug level monitoring: Secondary | ICD-10-CM

## 2016-09-25 LAB — POCT INR: INR: 3

## 2016-09-25 NOTE — Patient Instructions (Signed)
Pre visit review using our clinic review tool, if applicable. No additional management support is needed unless otherwise documented below in the visit note. 

## 2016-09-26 ENCOUNTER — Telehealth: Payer: Self-pay | Admitting: *Deleted

## 2016-09-26 MED ORDER — DIAZEPAM 5 MG PO TABS: 5.0000 mg | ORAL_TABLET | Freq: Two times a day (BID) | ORAL | 2 refills | Status: DC

## 2016-09-26 NOTE — Telephone Encounter (Signed)
Called refill into [facility] spoke with pharmacist gave md approval for diazepam.../LMB

## 2016-09-26 NOTE — Telephone Encounter (Signed)
Ok to refill 

## 2016-09-26 NOTE — Telephone Encounter (Signed)
Rec'd fax pt requesting refill on her Diazepam. Last filled 08/28/16...[name]

## 2016-09-26 NOTE — Progress Notes (Signed)
I have reviewed and agree with the plan. 

## 2016-10-02 DIAGNOSIS — L089 Local infection of the skin and subcutaneous tissue, unspecified: Secondary | ICD-10-CM | POA: Diagnosis not present

## 2016-10-02 DIAGNOSIS — L89624 Pressure ulcer of left heel, stage 4: Secondary | ICD-10-CM | POA: Diagnosis not present

## 2016-10-02 DIAGNOSIS — L89612 Pressure ulcer of right heel, stage 2: Secondary | ICD-10-CM | POA: Diagnosis not present

## 2016-10-02 DIAGNOSIS — G8250 Quadriplegia, unspecified: Secondary | ICD-10-CM | POA: Diagnosis not present

## 2016-10-02 DIAGNOSIS — Z7901 Long term (current) use of anticoagulants: Secondary | ICD-10-CM | POA: Diagnosis not present

## 2016-10-02 DIAGNOSIS — L89892 Pressure ulcer of other site, stage 2: Secondary | ICD-10-CM | POA: Diagnosis not present

## 2016-10-02 DIAGNOSIS — L89622 Pressure ulcer of left heel, stage 2: Secondary | ICD-10-CM | POA: Diagnosis not present

## 2016-10-03 DIAGNOSIS — L89612 Pressure ulcer of right heel, stage 2: Secondary | ICD-10-CM | POA: Diagnosis not present

## 2016-10-03 DIAGNOSIS — R339 Retention of urine, unspecified: Secondary | ICD-10-CM | POA: Diagnosis not present

## 2016-10-06 DIAGNOSIS — L89612 Pressure ulcer of right heel, stage 2: Secondary | ICD-10-CM | POA: Diagnosis not present

## 2016-10-10 DIAGNOSIS — I82409 Acute embolism and thrombosis of unspecified deep veins of unspecified lower extremity: Secondary | ICD-10-CM | POA: Diagnosis not present

## 2016-10-16 ENCOUNTER — Telehealth: Payer: Self-pay | Admitting: Internal Medicine

## 2016-10-16 ENCOUNTER — Ambulatory Visit (INDEPENDENT_AMBULATORY_CARE_PROVIDER_SITE_OTHER): Payer: Medicare Other | Admitting: General Practice

## 2016-10-16 DIAGNOSIS — Z5181 Encounter for therapeutic drug level monitoring: Secondary | ICD-10-CM

## 2016-10-16 LAB — POCT INR: INR: 3.7

## 2016-10-16 NOTE — Telephone Encounter (Signed)
New dosing instructions given to patient.

## 2016-10-16 NOTE — Telephone Encounter (Signed)
Pt called to report INR 3.7

## 2016-10-16 NOTE — Patient Instructions (Signed)
Pre visit review using our clinic review tool, if applicable. No additional management support is needed unless otherwise documented below in the visit note. 

## 2016-10-17 NOTE — Progress Notes (Signed)
I agree with this plan.

## 2016-10-20 ENCOUNTER — Ambulatory Visit (INDEPENDENT_AMBULATORY_CARE_PROVIDER_SITE_OTHER): Payer: Medicare Other | Admitting: General Practice

## 2016-10-20 DIAGNOSIS — Z5181 Encounter for therapeutic drug level monitoring: Secondary | ICD-10-CM

## 2016-10-20 DIAGNOSIS — L89612 Pressure ulcer of right heel, stage 2: Secondary | ICD-10-CM | POA: Diagnosis not present

## 2016-10-20 LAB — POCT INR: INR: 3.2

## 2016-10-20 NOTE — Patient Instructions (Signed)
Pre visit review using our clinic review tool, if applicable. No additional management support is needed unless otherwise documented below in the visit note. 

## 2016-10-20 NOTE — Progress Notes (Signed)
I have reviewed and agree with the plan. 

## 2016-10-23 ENCOUNTER — Ambulatory Visit: Payer: Self-pay | Admitting: General Practice

## 2016-10-23 ENCOUNTER — Ambulatory Visit (INDEPENDENT_AMBULATORY_CARE_PROVIDER_SITE_OTHER): Payer: Medicare Other | Admitting: General Practice

## 2016-10-23 DIAGNOSIS — Z5181 Encounter for therapeutic drug level monitoring: Secondary | ICD-10-CM

## 2016-10-23 LAB — POCT INR: INR: 1.8

## 2016-10-23 NOTE — Patient Instructions (Signed)
Pre visit review using our clinic review tool, if applicable. No additional management support is needed unless otherwise documented below in the visit note. 

## 2016-10-23 NOTE — Progress Notes (Signed)
I agree with this plan.

## 2016-10-30 ENCOUNTER — Other Ambulatory Visit: Payer: Self-pay | Admitting: Internal Medicine

## 2016-10-30 ENCOUNTER — Ambulatory Visit (INDEPENDENT_AMBULATORY_CARE_PROVIDER_SITE_OTHER): Payer: Medicare Other | Admitting: General Practice

## 2016-10-30 DIAGNOSIS — Z5181 Encounter for therapeutic drug level monitoring: Secondary | ICD-10-CM

## 2016-10-30 DIAGNOSIS — Z7901 Long term (current) use of anticoagulants: Secondary | ICD-10-CM | POA: Diagnosis not present

## 2016-10-30 DIAGNOSIS — L89622 Pressure ulcer of left heel, stage 2: Secondary | ICD-10-CM | POA: Diagnosis not present

## 2016-10-30 DIAGNOSIS — L89612 Pressure ulcer of right heel, stage 2: Secondary | ICD-10-CM | POA: Diagnosis not present

## 2016-10-30 DIAGNOSIS — G8250 Quadriplegia, unspecified: Secondary | ICD-10-CM | POA: Diagnosis not present

## 2016-10-30 DIAGNOSIS — L89892 Pressure ulcer of other site, stage 2: Secondary | ICD-10-CM | POA: Diagnosis not present

## 2016-10-30 LAB — POCT INR: INR: 3.3

## 2016-10-30 NOTE — Progress Notes (Signed)
I agree with this plan.

## 2016-10-30 NOTE — Patient Instructions (Signed)
Pre visit review using our clinic review tool, if applicable. No additional management support is needed unless otherwise documented below in the visit note. 

## 2016-10-31 DIAGNOSIS — L89612 Pressure ulcer of right heel, stage 2: Secondary | ICD-10-CM | POA: Diagnosis not present

## 2016-11-03 DIAGNOSIS — L89612 Pressure ulcer of right heel, stage 2: Secondary | ICD-10-CM | POA: Diagnosis not present

## 2016-11-08 DIAGNOSIS — R339 Retention of urine, unspecified: Secondary | ICD-10-CM | POA: Diagnosis not present

## 2016-11-09 DIAGNOSIS — R339 Retention of urine, unspecified: Secondary | ICD-10-CM | POA: Diagnosis not present

## 2016-11-10 ENCOUNTER — Ambulatory Visit (INDEPENDENT_AMBULATORY_CARE_PROVIDER_SITE_OTHER): Payer: Medicare Other | Admitting: General Practice

## 2016-11-10 DIAGNOSIS — Z5181 Encounter for therapeutic drug level monitoring: Secondary | ICD-10-CM

## 2016-11-10 LAB — POCT INR: INR: 2.1

## 2016-11-10 NOTE — Patient Instructions (Signed)
Pre visit review using our clinic review tool, if applicable. No additional management support is needed unless otherwise documented below in the visit note. 

## 2016-11-11 NOTE — Progress Notes (Signed)
INR management per [place].  Binnie Rail, MD

## 2016-11-16 DIAGNOSIS — R339 Retention of urine, unspecified: Secondary | ICD-10-CM | POA: Diagnosis not present

## 2016-11-16 DIAGNOSIS — L89612 Pressure ulcer of right heel, stage 2: Secondary | ICD-10-CM | POA: Diagnosis not present

## 2016-11-20 DIAGNOSIS — G8250 Quadriplegia, unspecified: Secondary | ICD-10-CM | POA: Diagnosis not present

## 2016-11-20 DIAGNOSIS — L89622 Pressure ulcer of left heel, stage 2: Secondary | ICD-10-CM | POA: Diagnosis not present

## 2016-11-20 DIAGNOSIS — L89892 Pressure ulcer of other site, stage 2: Secondary | ICD-10-CM | POA: Diagnosis not present

## 2016-11-20 DIAGNOSIS — L89612 Pressure ulcer of right heel, stage 2: Secondary | ICD-10-CM | POA: Diagnosis not present

## 2016-11-21 ENCOUNTER — Other Ambulatory Visit: Payer: Self-pay | Admitting: Internal Medicine

## 2016-11-28 ENCOUNTER — Other Ambulatory Visit: Payer: Self-pay | Admitting: Internal Medicine

## 2016-11-28 NOTE — Telephone Encounter (Signed)
Called refill into [facility] spoke w/pharmacist gave MD authorization...[name]

## 2016-11-28 NOTE — Telephone Encounter (Signed)
[place] controlled substance database checked.  Ok to fill medication.  

## 2016-12-05 DIAGNOSIS — I82409 Acute embolism and thrombosis of unspecified deep veins of unspecified lower extremity: Secondary | ICD-10-CM | POA: Diagnosis not present

## 2016-12-11 DIAGNOSIS — R339 Retention of urine, unspecified: Secondary | ICD-10-CM | POA: Diagnosis not present

## 2016-12-12 DIAGNOSIS — R339 Retention of urine, unspecified: Secondary | ICD-10-CM | POA: Diagnosis not present

## 2016-12-12 DIAGNOSIS — S91302A Unspecified open wound, left foot, initial encounter: Secondary | ICD-10-CM | POA: Diagnosis not present

## 2016-12-12 DIAGNOSIS — L89624 Pressure ulcer of left heel, stage 4: Secondary | ICD-10-CM | POA: Diagnosis not present

## 2016-12-12 DIAGNOSIS — S31829A Unspecified open wound of left buttock, initial encounter: Secondary | ICD-10-CM | POA: Diagnosis not present

## 2016-12-12 DIAGNOSIS — L89892 Pressure ulcer of other site, stage 2: Secondary | ICD-10-CM | POA: Diagnosis not present

## 2016-12-13 ENCOUNTER — Ambulatory Visit (INDEPENDENT_AMBULATORY_CARE_PROVIDER_SITE_OTHER): Payer: Medicare Other | Admitting: General Practice

## 2016-12-13 DIAGNOSIS — Z5181 Encounter for therapeutic drug level monitoring: Secondary | ICD-10-CM

## 2016-12-13 LAB — POCT INR: INR: 1.8

## 2016-12-13 NOTE — Patient Instructions (Signed)
Pre visit review using our clinic review tool, if applicable. No additional management support is needed unless otherwise documented below in the visit note. 

## 2016-12-13 NOTE — Progress Notes (Signed)
I have reviewed and agree with the plan. 

## 2016-12-19 DIAGNOSIS — R339 Retention of urine, unspecified: Secondary | ICD-10-CM | POA: Diagnosis not present

## 2016-12-25 DIAGNOSIS — Z7901 Long term (current) use of anticoagulants: Secondary | ICD-10-CM | POA: Diagnosis not present

## 2016-12-25 DIAGNOSIS — L89892 Pressure ulcer of other site, stage 2: Secondary | ICD-10-CM | POA: Diagnosis not present

## 2016-12-25 DIAGNOSIS — L89622 Pressure ulcer of left heel, stage 2: Secondary | ICD-10-CM | POA: Diagnosis not present

## 2016-12-25 DIAGNOSIS — G8250 Quadriplegia, unspecified: Secondary | ICD-10-CM | POA: Diagnosis not present

## 2016-12-28 DIAGNOSIS — L89612 Pressure ulcer of right heel, stage 2: Secondary | ICD-10-CM | POA: Diagnosis not present

## 2016-12-29 ENCOUNTER — Other Ambulatory Visit: Payer: Self-pay | Admitting: Internal Medicine

## 2017-01-01 ENCOUNTER — Ambulatory Visit (INDEPENDENT_AMBULATORY_CARE_PROVIDER_SITE_OTHER): Payer: Medicare Other | Admitting: General Practice

## 2017-01-01 DIAGNOSIS — Z5181 Encounter for therapeutic drug level monitoring: Secondary | ICD-10-CM

## 2017-01-01 LAB — POCT INR: INR: 1.4

## 2017-01-01 NOTE — Patient Instructions (Signed)
Pre visit review using our clinic review tool, if applicable. No additional management support is needed unless otherwise documented below in the visit note. 

## 2017-01-01 NOTE — Telephone Encounter (Signed)
RX faxed to [facility] 

## 2017-01-03 ENCOUNTER — Telehealth: Payer: Self-pay | Admitting: Internal Medicine

## 2017-01-03 NOTE — Telephone Encounter (Signed)
Spoke with patient regarding awv. Patient stated that she is not interested in scheduling her annual wellness, but she wants to schedule CPE. Schedule CPE for April 2018.

## 2017-01-17 DIAGNOSIS — R339 Retention of urine, unspecified: Secondary | ICD-10-CM | POA: Diagnosis not present

## 2017-01-22 DIAGNOSIS — G8250 Quadriplegia, unspecified: Secondary | ICD-10-CM | POA: Diagnosis not present

## 2017-01-22 DIAGNOSIS — Z7901 Long term (current) use of anticoagulants: Secondary | ICD-10-CM | POA: Diagnosis not present

## 2017-01-22 DIAGNOSIS — L089 Local infection of the skin and subcutaneous tissue, unspecified: Secondary | ICD-10-CM | POA: Diagnosis not present

## 2017-01-22 DIAGNOSIS — L89612 Pressure ulcer of right heel, stage 2: Secondary | ICD-10-CM | POA: Diagnosis not present

## 2017-01-22 DIAGNOSIS — R339 Retention of urine, unspecified: Secondary | ICD-10-CM | POA: Diagnosis not present

## 2017-01-22 DIAGNOSIS — L89892 Pressure ulcer of other site, stage 2: Secondary | ICD-10-CM | POA: Diagnosis not present

## 2017-01-22 DIAGNOSIS — L89622 Pressure ulcer of left heel, stage 2: Secondary | ICD-10-CM | POA: Diagnosis not present

## 2017-01-26 ENCOUNTER — Other Ambulatory Visit: Payer: Self-pay | Admitting: Internal Medicine

## 2017-01-26 ENCOUNTER — Ambulatory Visit (INDEPENDENT_AMBULATORY_CARE_PROVIDER_SITE_OTHER): Payer: Medicare Other

## 2017-01-26 ENCOUNTER — Telehealth: Payer: Self-pay | Admitting: Internal Medicine

## 2017-01-26 DIAGNOSIS — Z5181 Encounter for therapeutic drug level monitoring: Secondary | ICD-10-CM

## 2017-01-26 LAB — POCT INR: INR: 2.2

## 2017-01-26 NOTE — Telephone Encounter (Signed)
Pt called in her INR 2.2 today 01/26/2017

## 2017-01-30 ENCOUNTER — Other Ambulatory Visit: Payer: Self-pay | Admitting: Internal Medicine

## 2017-01-30 LAB — POCT INR: INR: 2.2

## 2017-01-30 NOTE — Patient Instructions (Signed)
Pre visit review using our clinic review tool, if applicable. No additional management support is needed unless otherwise documented below in the visit note. 

## 2017-01-30 NOTE — Telephone Encounter (Signed)
Spoke with patient regarding 01/26/17 results to ensure she was continuing proper dosing.  (refer to coag encounter note for details).  Cc'ed Villa Herb, RN on encounter.

## 2017-02-01 ENCOUNTER — Other Ambulatory Visit: Payer: Self-pay | Admitting: General Practice

## 2017-02-01 MED ORDER — WARFARIN SODIUM 2 MG PO TABS: ORAL_TABLET | ORAL | 1 refills | Status: DC

## 2017-02-03 NOTE — Progress Notes (Signed)
Agree with management.  Zienna Ahlin J Maryjean Corpening, MD  

## 2017-02-16 ENCOUNTER — Ambulatory Visit (INDEPENDENT_AMBULATORY_CARE_PROVIDER_SITE_OTHER): Payer: Medicare Other | Admitting: General Practice

## 2017-02-16 DIAGNOSIS — Z5181 Encounter for therapeutic drug level monitoring: Secondary | ICD-10-CM

## 2017-02-16 LAB — POCT INR: INR: 2.1

## 2017-02-16 NOTE — Patient Instructions (Signed)
Pre visit review using our clinic review tool, if applicable. No additional management support is needed unless otherwise documented below in the visit note. 

## 2017-02-16 NOTE — Progress Notes (Signed)
I have reviewed and agree with the plan. 

## 2017-02-19 DIAGNOSIS — L89612 Pressure ulcer of right heel, stage 2: Secondary | ICD-10-CM | POA: Diagnosis not present

## 2017-02-19 DIAGNOSIS — L89622 Pressure ulcer of left heel, stage 2: Secondary | ICD-10-CM | POA: Diagnosis not present

## 2017-02-19 DIAGNOSIS — L89892 Pressure ulcer of other site, stage 2: Secondary | ICD-10-CM | POA: Diagnosis not present

## 2017-02-19 DIAGNOSIS — G8250 Quadriplegia, unspecified: Secondary | ICD-10-CM | POA: Diagnosis not present

## 2017-02-19 DIAGNOSIS — Z7901 Long term (current) use of anticoagulants: Secondary | ICD-10-CM | POA: Diagnosis not present

## 2017-02-22 DIAGNOSIS — I82409 Acute embolism and thrombosis of unspecified deep veins of unspecified lower extremity: Secondary | ICD-10-CM | POA: Diagnosis not present

## 2017-02-22 DIAGNOSIS — R339 Retention of urine, unspecified: Secondary | ICD-10-CM | POA: Diagnosis not present

## 2017-02-23 DIAGNOSIS — I82409 Acute embolism and thrombosis of unspecified deep veins of unspecified lower extremity: Secondary | ICD-10-CM | POA: Diagnosis not present

## 2017-02-23 DIAGNOSIS — R339 Retention of urine, unspecified: Secondary | ICD-10-CM | POA: Diagnosis not present

## 2017-02-23 DIAGNOSIS — L89892 Pressure ulcer of other site, stage 2: Secondary | ICD-10-CM | POA: Diagnosis not present

## 2017-02-26 DIAGNOSIS — L89892 Pressure ulcer of other site, stage 2: Secondary | ICD-10-CM | POA: Diagnosis not present

## 2017-02-28 DIAGNOSIS — L89612 Pressure ulcer of right heel, stage 2: Secondary | ICD-10-CM | POA: Diagnosis not present

## 2017-03-01 ENCOUNTER — Other Ambulatory Visit: Payer: Self-pay | Admitting: Internal Medicine

## 2017-03-15 ENCOUNTER — Ambulatory Visit (INDEPENDENT_AMBULATORY_CARE_PROVIDER_SITE_OTHER): Payer: Medicare Other | Admitting: General Practice

## 2017-03-15 DIAGNOSIS — Z5181 Encounter for therapeutic drug level monitoring: Secondary | ICD-10-CM

## 2017-03-15 LAB — POCT INR: INR: 1.5

## 2017-03-15 NOTE — Patient Instructions (Signed)
Pre visit review using our clinic review tool, if applicable. No additional management support is needed unless otherwise documented below in the visit note. 

## 2017-03-15 NOTE — Progress Notes (Signed)
I have reviewed and agree with the plan. 

## 2017-03-19 DIAGNOSIS — G8250 Quadriplegia, unspecified: Secondary | ICD-10-CM | POA: Diagnosis not present

## 2017-03-19 DIAGNOSIS — L089 Local infection of the skin and subcutaneous tissue, unspecified: Secondary | ICD-10-CM | POA: Diagnosis not present

## 2017-03-19 DIAGNOSIS — L89612 Pressure ulcer of right heel, stage 2: Secondary | ICD-10-CM | POA: Diagnosis not present

## 2017-03-19 DIAGNOSIS — Z7901 Long term (current) use of anticoagulants: Secondary | ICD-10-CM | POA: Diagnosis not present

## 2017-03-19 DIAGNOSIS — L89622 Pressure ulcer of left heel, stage 2: Secondary | ICD-10-CM | POA: Diagnosis not present

## 2017-03-19 DIAGNOSIS — L89892 Pressure ulcer of other site, stage 2: Secondary | ICD-10-CM | POA: Diagnosis not present
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
## 2017-03-26 ENCOUNTER — Other Ambulatory Visit (INDEPENDENT_AMBULATORY_CARE_PROVIDER_SITE_OTHER): Payer: Medicare Other

## 2017-03-26 ENCOUNTER — Ambulatory Visit (INDEPENDENT_AMBULATORY_CARE_PROVIDER_SITE_OTHER): Payer: Medicare Other | Admitting: Internal Medicine

## 2017-03-26 ENCOUNTER — Encounter: Payer: Self-pay | Admitting: Internal Medicine

## 2017-03-26 VITALS — BP 124/74 | HR 52 | Temp 97.4°F | Resp 16

## 2017-03-26 DIAGNOSIS — R946 Abnormal results of thyroid function studies: Secondary | ICD-10-CM

## 2017-03-26 DIAGNOSIS — R739 Hyperglycemia, unspecified: Secondary | ICD-10-CM

## 2017-03-26 DIAGNOSIS — Z0000 Encounter for general adult medical examination without abnormal findings: Secondary | ICD-10-CM | POA: Diagnosis not present

## 2017-03-26 DIAGNOSIS — D649 Anemia, unspecified: Secondary | ICD-10-CM

## 2017-03-26 DIAGNOSIS — E038 Other specified hypothyroidism: Secondary | ICD-10-CM | POA: Diagnosis not present

## 2017-03-26 DIAGNOSIS — Z936 Other artificial openings of urinary tract status: Secondary | ICD-10-CM | POA: Diagnosis not present

## 2017-03-26 DIAGNOSIS — R7989 Other specified abnormal findings of blood chemistry: Secondary | ICD-10-CM

## 2017-03-26 DIAGNOSIS — Z5181 Encounter for therapeutic drug level monitoring: Secondary | ICD-10-CM

## 2017-03-26 DIAGNOSIS — R339 Retention of urine, unspecified: Secondary | ICD-10-CM | POA: Diagnosis not present

## 2017-03-26 DIAGNOSIS — R292 Abnormal reflex: Secondary | ICD-10-CM | POA: Insufficient documentation

## 2017-03-26 LAB — CBC WITH DIFFERENTIAL/PLATELET
BASOS ABS: 0 10*3/uL (ref 0.0–0.1)
Basophils Relative: 0.3 % (ref 0.0–3.0)
Eosinophils Absolute: 0.1 10*3/uL (ref 0.0–0.7)
Eosinophils Relative: 1.1 % (ref 0.0–5.0)
HEMATOCRIT: 39.4 % (ref 36.0–46.0)
HEMOGLOBIN: 13.1 g/dL (ref 12.0–15.0)
LYMPHS PCT: 40.4 % (ref 12.0–46.0)
Lymphs Abs: 2.6 10*3/uL (ref 0.7–4.0)
MCHC: 33.2 g/dL (ref 30.0–36.0)
MCV: 93.5 fl (ref 78.0–100.0)
Monocytes Absolute: 0.6 10*3/uL (ref 0.1–1.0)
Monocytes Relative: 9.6 % (ref 3.0–12.0)
NEUTROS ABS: 3.1 10*3/uL (ref 1.4–7.7)
Neutrophils Relative %: 48.6 % (ref 43.0–77.0)
PLATELETS: 173 10*3/uL (ref 150.0–400.0)
RBC: 4.21 Mil/uL (ref 3.87–5.11)
RDW: 14 % (ref 11.5–15.5)
WBC: 6.4 10*3/uL (ref 4.0–10.5)

## 2017-03-26 LAB — COMPREHENSIVE METABOLIC PANEL
ALBUMIN: 3.6 g/dL (ref 3.5–5.2)
ALT: 25 U/L (ref 0–35)
AST: 26 U/L (ref 0–37)
Alkaline Phosphatase: 91 U/L (ref 39–117)
BILIRUBIN TOTAL: 0.2 mg/dL (ref 0.2–1.2)
BUN: 17 mg/dL (ref 6–23)
CALCIUM: 9.9 mg/dL (ref 8.4–10.5)
CO2: 30 meq/L (ref 19–32)
Chloride: 99 mEq/L (ref 96–112)
Creatinine, Ser: 0.47 mg/dL (ref 0.40–1.20)
GFR: 142.66 mL/min (ref >60.00)
Glucose, Bld: 63 mg/dL — ABNORMAL LOW (ref 70–99)
Potassium: 3.3 mEq/L — ABNORMAL LOW (ref 3.5–5.1)
Sodium: 136 mEq/L (ref 135–145)
Total Protein: 7.6 g/dL (ref 6.0–8.3)

## 2017-03-26 LAB — LIPID PANEL
CHOLESTEROL: 168 mg/dL (ref 0–200)
HDL: 46.8 mg/dL (ref >39.00)
LDL CALC: 97 mg/dL (ref 0–99)
NonHDL: 121.67
Total CHOL/HDL Ratio: 4
Triglycerides: 124 mg/dL (ref 0.0–149.0)
VLDL: 24.8 mg/dL (ref 0.0–40.0)

## 2017-03-26 LAB — TSH: TSH: 6.02 u[IU]/mL — ABNORMAL HIGH (ref 0.35–4.50)

## 2017-03-26 LAB — FERRITIN: Ferritin: 18.4 ng/mL (ref 10.0–291.0)

## 2017-03-26 LAB — PROTIME-INR
INR: 2 ratio — AB (ref 0.8–1.0)
PROTHROMBIN TIME: 21.1 s — AB (ref 9.6–13.1)

## 2017-03-26 LAB — HEMOGLOBIN A1C: Hgb A1c MFr Bld: 5.1 % (ref 4.6–6.5)

## 2017-03-26 LAB — IRON: Iron: 62 ug/dL (ref 42–145)

## 2017-03-26 MED ORDER — LEVOTHYROXINE SODIUM 25 MCG PO TABS: ORAL_TABLET | ORAL | 5 refills | Status: DC

## 2017-03-26 MED ORDER — DIAZEPAM 5 MG PO TABS: 5.0000 mg | ORAL_TABLET | Freq: Two times a day (BID) | ORAL | 0 refills | Status: DC | PRN

## 2017-03-26 MED ORDER — PROTEIN 500 MG PO CHEW: CHEWABLE_TABLET | ORAL | 0 refills | Status: AC

## 2017-03-26 MED ORDER — ACETAMINOPHEN-CODEINE #3 300-30 MG PO TABS: 1.0000 | ORAL_TABLET | ORAL | 0 refills | Status: DC | PRN

## 2017-03-26 NOTE — Assessment & Plan Note (Signed)
Check tsh  Titrate med dose if needed  

## 2017-03-26 NOTE — Assessment & Plan Note (Signed)
Check inr - on warfarin

## 2017-03-26 NOTE — Patient Instructions (Signed)

## 2017-03-26 NOTE — Progress Notes (Signed)
Pre visit review using our clinic review tool, if applicable. No additional management support is needed unless otherwise documented below in the visit note. 

## 2017-03-26 NOTE — Assessment & Plan Note (Signed)
Cbc,  Iron, ferritin

## 2017-03-26 NOTE — Assessment & Plan Note (Signed)
Check a1c 

## 2017-03-27 ENCOUNTER — Ambulatory Visit (INDEPENDENT_AMBULATORY_CARE_PROVIDER_SITE_OTHER): Payer: Medicare Other | Admitting: General Practice

## 2017-03-27 ENCOUNTER — Telehealth: Payer: Self-pay | Admitting: Internal Medicine

## 2017-03-27 DIAGNOSIS — R339 Retention of urine, unspecified: Secondary | ICD-10-CM | POA: Diagnosis not present

## 2017-03-27 DIAGNOSIS — Z5181 Encounter for therapeutic drug level monitoring: Secondary | ICD-10-CM

## 2017-03-27 DIAGNOSIS — I82409 Acute embolism and thrombosis of unspecified deep veins of unspecified lower extremity: Secondary | ICD-10-CM | POA: Diagnosis not present

## 2017-03-27 NOTE — Patient Instructions (Signed)
Pre visit review using our clinic review tool, if applicable. No additional management support is needed unless otherwise documented below in the visit note. 

## 2017-03-27 NOTE — Progress Notes (Signed)
I have reviewed and agree with the plan. 

## 2017-03-27 NOTE — Telephone Encounter (Signed)
Patient has contacted company requesting sterile saline.  Requesting call back with a verbal for patient.

## 2017-03-28 ENCOUNTER — Other Ambulatory Visit: Payer: Self-pay | Admitting: Internal Medicine

## 2017-03-28 NOTE — Telephone Encounter (Signed)
Spoke with [employer] rep to give verbals per MD for increase in sterile saline.

## 2017-03-28 NOTE — Telephone Encounter (Signed)
RX faxed to [facility] 

## 2017-03-29 ENCOUNTER — Encounter: Payer: Self-pay | Admitting: Internal Medicine

## 2017-03-29 MED ORDER — LEVOTHYROXINE SODIUM 50 MCG PO TABS: 50.0000 ug | ORAL_TABLET | Freq: Every day | ORAL | 3 refills | Status: DC

## 2017-03-30 ENCOUNTER — Telehealth: Payer: Self-pay | Admitting: Internal Medicine

## 2017-03-30 ENCOUNTER — Other Ambulatory Visit: Payer: Self-pay | Admitting: Internal Medicine

## 2017-03-30 NOTE — Telephone Encounter (Signed)
Pt called in and would like to know if having low potassium has anything to do with her being so weak and tired all the time ?

## 2017-04-02 NOTE — Telephone Encounter (Signed)
Spoke with pt to inform. Also clarified to pt change in thyroid medication. She will call back to have orders faxed to [facility] to have labs drawn.

## 2017-04-02 NOTE — Telephone Encounter (Signed)
probably not - ususally it causes no symptoms

## 2017-04-06 DIAGNOSIS — L89612 Pressure ulcer of right heel, stage 2: Secondary | ICD-10-CM | POA: Diagnosis not present

## 2017-04-10 DIAGNOSIS — Z936 Other artificial openings of urinary tract status: Secondary | ICD-10-CM | POA: Diagnosis not present

## 2017-04-10 DIAGNOSIS — R339 Retention of urine, unspecified: Secondary | ICD-10-CM | POA: Diagnosis not present

## 2017-04-10 DIAGNOSIS — I82409 Acute embolism and thrombosis of unspecified deep veins of unspecified lower extremity: Secondary | ICD-10-CM | POA: Diagnosis not present

## 2017-04-19 DIAGNOSIS — R339 Retention of urine, unspecified: Secondary | ICD-10-CM | POA: Diagnosis not present

## 2017-04-20 ENCOUNTER — Ambulatory Visit (INDEPENDENT_AMBULATORY_CARE_PROVIDER_SITE_OTHER): Payer: Medicare Other | Admitting: General Practice

## 2017-04-20 DIAGNOSIS — Z5181 Encounter for therapeutic drug level monitoring: Secondary | ICD-10-CM

## 2017-04-20 LAB — POCT INR: INR: 2

## 2017-04-20 NOTE — Patient Instructions (Signed)
Pre visit review using our clinic review tool, if applicable. No additional management support is needed unless otherwise documented below in the visit note. 

## 2017-04-21 NOTE — Progress Notes (Signed)
I have reviewed and agree with the plan. 

## 2017-04-27 ENCOUNTER — Other Ambulatory Visit: Payer: Self-pay | Admitting: Internal Medicine

## 2017-04-27 ENCOUNTER — Telehealth: Payer: Self-pay | Admitting: Internal Medicine

## 2017-04-27 MED ORDER — LEVOTHYROXINE SODIUM 50 MCG PO TABS: 50.0000 ug | ORAL_TABLET | Freq: Every day | ORAL | 11 refills | Status: DC

## 2017-04-27 MED ORDER — LEVOTHYROXINE SODIUM 50 MCG PO TABS: 50.0000 ug | ORAL_TABLET | Freq: Every day | ORAL | 3 refills | Status: DC

## 2017-04-27 NOTE — Telephone Encounter (Signed)
levothyroxine (SYNTHROID, LEVOTHROID) 50 MCG tablet   Patient is requesting this medication be sent over to [facility]. They have informed her they do not have the RX that was sent in April. Thank you.

## 2017-04-27 NOTE — Telephone Encounter (Signed)
Pt left msg on triage stating MD increase her Levothyroxine from 25 mcg to 50 mcg. Pharmacy did not receive updated script, and will need refill on Sunday. Verified chart per email MD inform pt to take 2 of the 25 mcg, and she would send 50 mg to pharmacy. Inform pt new rx has been sent pt requesting quantity to be sent in as a 30 day supply since she may have to change msg again.....[name]

## 2017-05-03 ENCOUNTER — Telehealth: Payer: Self-pay | Admitting: Internal Medicine

## 2017-05-03 DIAGNOSIS — G8250 Quadriplegia, unspecified: Secondary | ICD-10-CM

## 2017-05-03 DIAGNOSIS — N319 Neuromuscular dysfunction of bladder, unspecified: Secondary | ICD-10-CM

## 2017-05-03 NOTE — Telephone Encounter (Signed)
Spoke with pt to inform referral was entered.

## 2017-05-03 NOTE — Telephone Encounter (Signed)
Pt would like a referral put in for someone to come out twice a month to clean her catheter she would  like for the 1st and 3rd Monday of the month. The lady who was doing it is having surgery so she would just need someone short term until she recovers. Please advise.

## 2017-05-03 NOTE — Telephone Encounter (Signed)
ordered

## 2017-05-03 NOTE — Telephone Encounter (Signed)
I dont not think she is currently using homehealth. Please enter referral for nursing

## 2017-05-04 DIAGNOSIS — L89612 Pressure ulcer of right heel, stage 2: Secondary | ICD-10-CM | POA: Diagnosis not present

## 2017-05-07 ENCOUNTER — Encounter: Payer: Self-pay | Admitting: Internal Medicine

## 2017-05-07 ENCOUNTER — Other Ambulatory Visit (INDEPENDENT_AMBULATORY_CARE_PROVIDER_SITE_OTHER): Payer: Medicare Other

## 2017-05-07 DIAGNOSIS — E785 Hyperlipidemia, unspecified: Secondary | ICD-10-CM

## 2017-05-07 DIAGNOSIS — L89612 Pressure ulcer of right heel, stage 2: Secondary | ICD-10-CM | POA: Diagnosis not present

## 2017-05-07 DIAGNOSIS — Z7901 Long term (current) use of anticoagulants: Secondary | ICD-10-CM | POA: Diagnosis not present

## 2017-05-07 DIAGNOSIS — L89622 Pressure ulcer of left heel, stage 2: Secondary | ICD-10-CM | POA: Diagnosis not present

## 2017-05-07 DIAGNOSIS — G8250 Quadriplegia, unspecified: Secondary | ICD-10-CM | POA: Diagnosis not present

## 2017-05-07 DIAGNOSIS — L89892 Pressure ulcer of other site, stage 2: Secondary | ICD-10-CM | POA: Diagnosis not present

## 2017-05-07 LAB — TSH: TSH: 4.33 u[IU]/mL (ref 0.35–4.50)

## 2017-05-15 DIAGNOSIS — E038 Other specified hypothyroidism: Secondary | ICD-10-CM | POA: Diagnosis not present

## 2017-05-15 DIAGNOSIS — G8250 Quadriplegia, unspecified: Secondary | ICD-10-CM | POA: Diagnosis not present

## 2017-05-15 DIAGNOSIS — N319 Neuromuscular dysfunction of bladder, unspecified: Secondary | ICD-10-CM | POA: Diagnosis not present

## 2017-05-16 ENCOUNTER — Telehealth: Payer: Self-pay | Admitting: Internal Medicine

## 2017-05-16 NOTE — Telephone Encounter (Signed)
Verbal Orders for nuse 2 x a month for catheter changes.  [phone]

## 2017-05-16 NOTE — Telephone Encounter (Signed)
Spoke with Joseph Art to give verbal orders per MD

## 2017-05-21 DIAGNOSIS — G8250 Quadriplegia, unspecified: Secondary | ICD-10-CM | POA: Diagnosis not present

## 2017-05-21 DIAGNOSIS — N319 Neuromuscular dysfunction of bladder, unspecified: Secondary | ICD-10-CM | POA: Diagnosis not present

## 2017-05-21 DIAGNOSIS — E038 Other specified hypothyroidism: Secondary | ICD-10-CM | POA: Diagnosis not present

## 2017-05-25 ENCOUNTER — Ambulatory Visit (INDEPENDENT_AMBULATORY_CARE_PROVIDER_SITE_OTHER): Payer: Medicare Other | Admitting: General Practice

## 2017-05-25 DIAGNOSIS — G8250 Quadriplegia, unspecified: Secondary | ICD-10-CM | POA: Diagnosis not present

## 2017-05-25 DIAGNOSIS — E038 Other specified hypothyroidism: Secondary | ICD-10-CM | POA: Diagnosis not present

## 2017-05-25 DIAGNOSIS — Z5181 Encounter for therapeutic drug level monitoring: Secondary | ICD-10-CM

## 2017-05-25 DIAGNOSIS — N319 Neuromuscular dysfunction of bladder, unspecified: Secondary | ICD-10-CM | POA: Diagnosis not present

## 2017-05-25 LAB — POCT INR: INR: 1.2

## 2017-05-25 NOTE — Patient Instructions (Signed)
Pre visit review using our clinic review tool, if applicable. No additional management support is needed unless otherwise documented below in the visit note. 

## 2017-05-25 NOTE — Progress Notes (Signed)
I have reviewed and agree with the plan. 

## 2017-05-28 ENCOUNTER — Other Ambulatory Visit: Payer: Self-pay | Admitting: Internal Medicine

## 2017-05-28 NOTE — Telephone Encounter (Signed)
RX faxed to [facility] 

## 2017-05-30 ENCOUNTER — Telehealth: Payer: Self-pay | Admitting: Internal Medicine

## 2017-05-30 NOTE — Telephone Encounter (Signed)
Pt called [facility] ,  She states she has given you a list of supplies she uses, she has switched to [facility] because it is cheaper , but they need an order before [facility] can mail her the supplies. They need an order for the supplies. [facility] phone Number [phone] or [phone], she could not see the last 4 very clearly, located in [place]  Pt would like a call back

## 2017-05-31 NOTE — Telephone Encounter (Signed)
Spoke with [facility], they will need orders with model numbers and demographics faxed to [phone]. Pt is aware.

## 2017-05-31 NOTE — Telephone Encounter (Signed)
Pt called back regarding this.  

## 2017-06-01 ENCOUNTER — Ambulatory Visit (INDEPENDENT_AMBULATORY_CARE_PROVIDER_SITE_OTHER): Payer: Medicare Other | Admitting: General Practice

## 2017-06-01 DIAGNOSIS — I82403 Acute embolism and thrombosis of unspecified deep veins of lower extremity, bilateral: Secondary | ICD-10-CM

## 2017-06-01 DIAGNOSIS — Z5181 Encounter for therapeutic drug level monitoring: Secondary | ICD-10-CM

## 2017-06-01 LAB — POCT INR: INR: 2

## 2017-06-01 NOTE — Patient Instructions (Signed)
Pre visit review using our clinic review tool, if applicable. No additional management support is needed unless otherwise documented below in the visit note. 

## 2017-06-01 NOTE — Telephone Encounter (Signed)
Spoke with pt yesterday. Pt faxed over supply sheet with model numbers. Necessary information has been faxed to [facility].

## 2017-06-01 NOTE — Progress Notes (Signed)
I have reviewed and agree with the plan. 

## 2017-06-01 NOTE — Telephone Encounter (Signed)
Gareth Eagle from [employer] [phone] [facility] needs an updated order for what supplies the patient is actually needing and what she will be using them for

## 2017-06-04 DIAGNOSIS — Z86718 Personal history of other venous thrombosis and embolism: Secondary | ICD-10-CM | POA: Diagnosis not present

## 2017-06-04 DIAGNOSIS — E038 Other specified hypothyroidism: Secondary | ICD-10-CM | POA: Diagnosis not present

## 2017-06-04 DIAGNOSIS — G8250 Quadriplegia, unspecified: Secondary | ICD-10-CM | POA: Diagnosis not present

## 2017-06-04 DIAGNOSIS — Z7901 Long term (current) use of anticoagulants: Secondary | ICD-10-CM | POA: Diagnosis not present

## 2017-06-04 DIAGNOSIS — N319 Neuromuscular dysfunction of bladder, unspecified: Secondary | ICD-10-CM | POA: Diagnosis not present

## 2017-06-04 NOTE — Telephone Encounter (Signed)
Spoke with pt, LVM for Gareth Eagle to call back and discuss how orders can be sent over.

## 2017-06-04 NOTE — Telephone Encounter (Signed)
Spoke with pt to inform. Pt will call [facility] and contact me as to what needs to be done on our side.

## 2017-06-04 NOTE — Telephone Encounter (Signed)
Pt called you back. She said that you can call when you are available.

## 2017-06-05 NOTE — Telephone Encounter (Signed)
Spoke with [employer]. Shipment has been sent to pt, 2-20 fr caths, 2 foley cath tray, 4- 2000 mol drain bag, and 4 leg bags. They have tried to contact pt and unable to reach her.

## 2017-06-05 NOTE — Telephone Encounter (Signed)
Order form has been faxed to [facility]. They will contact us if any further information is needed.

## 2017-06-14 DIAGNOSIS — L89612 Pressure ulcer of right heel, stage 2: Secondary | ICD-10-CM | POA: Diagnosis not present

## 2017-06-18 DIAGNOSIS — L89622 Pressure ulcer of left heel, stage 2: Secondary | ICD-10-CM | POA: Diagnosis not present

## 2017-06-18 DIAGNOSIS — L89892 Pressure ulcer of other site, stage 2: Secondary | ICD-10-CM | POA: Diagnosis not present

## 2017-06-18 DIAGNOSIS — H31093 Other chorioretinal scars, bilateral: Secondary | ICD-10-CM | POA: Diagnosis not present

## 2017-06-18 DIAGNOSIS — G8250 Quadriplegia, unspecified: Secondary | ICD-10-CM | POA: Diagnosis not present

## 2017-06-18 DIAGNOSIS — Z7901 Long term (current) use of anticoagulants: Secondary | ICD-10-CM | POA: Diagnosis not present

## 2017-06-18 DIAGNOSIS — D3132 Benign neoplasm of left choroid: Secondary | ICD-10-CM | POA: Diagnosis not present

## 2017-06-18 DIAGNOSIS — H2513 Age-related nuclear cataract, bilateral: Secondary | ICD-10-CM | POA: Diagnosis not present

## 2017-06-18 DIAGNOSIS — H53031 Strabismic amblyopia, right eye: Secondary | ICD-10-CM | POA: Diagnosis not present

## 2017-06-18 LAB — POCT INR: INR: 1.9

## 2017-06-19 DIAGNOSIS — N319 Neuromuscular dysfunction of bladder, unspecified: Secondary | ICD-10-CM | POA: Diagnosis not present

## 2017-06-19 DIAGNOSIS — E038 Other specified hypothyroidism: Secondary | ICD-10-CM | POA: Diagnosis not present

## 2017-06-19 DIAGNOSIS — G8250 Quadriplegia, unspecified: Secondary | ICD-10-CM | POA: Diagnosis not present

## 2017-06-20 LAB — POCT INR: INR: 2 — AB (ref <=1.1)

## 2017-06-21 ENCOUNTER — Ambulatory Visit (INDEPENDENT_AMBULATORY_CARE_PROVIDER_SITE_OTHER): Payer: Medicare Other | Admitting: General Practice

## 2017-06-21 DIAGNOSIS — Z5181 Encounter for therapeutic drug level monitoring: Secondary | ICD-10-CM

## 2017-06-21 NOTE — Patient Instructions (Signed)
Pre visit review using our clinic review tool, if applicable. No additional management support is needed unless otherwise documented below in the visit note. 

## 2017-06-21 NOTE — Progress Notes (Signed)
I have reviewed and agree with the plan. 

## 2017-07-04 DIAGNOSIS — Z7901 Long term (current) use of anticoagulants: Secondary | ICD-10-CM | POA: Diagnosis not present

## 2017-07-04 DIAGNOSIS — Z86718 Personal history of other venous thrombosis and embolism: Secondary | ICD-10-CM | POA: Diagnosis not present

## 2017-07-09 ENCOUNTER — Ambulatory Visit (INDEPENDENT_AMBULATORY_CARE_PROVIDER_SITE_OTHER): Payer: Medicare Other | Admitting: General Practice

## 2017-07-09 ENCOUNTER — Telehealth: Payer: Self-pay | Admitting: Internal Medicine

## 2017-07-09 DIAGNOSIS — Z5181 Encounter for therapeutic drug level monitoring: Secondary | ICD-10-CM | POA: Diagnosis not present

## 2017-07-09 LAB — POCT INR: INR: 1.7

## 2017-07-09 NOTE — Telephone Encounter (Signed)
[name] pt would like to have a call back

## 2017-07-09 NOTE — Progress Notes (Signed)
I agree with this plan.

## 2017-07-09 NOTE — Patient Instructions (Signed)
Pre visit review using our clinic review tool, if applicable. No additional management support is needed unless otherwise documented below in the visit note. 

## 2017-07-10 ENCOUNTER — Telehealth: Payer: Self-pay | Admitting: Internal Medicine

## 2017-07-10 DIAGNOSIS — G8250 Quadriplegia, unspecified: Secondary | ICD-10-CM | POA: Diagnosis not present

## 2017-07-10 DIAGNOSIS — N319 Neuromuscular dysfunction of bladder, unspecified: Secondary | ICD-10-CM | POA: Diagnosis not present

## 2017-07-10 DIAGNOSIS — E038 Other specified hypothyroidism: Secondary | ICD-10-CM | POA: Diagnosis not present

## 2017-07-10 NOTE — Telephone Encounter (Signed)
[name] home health  [phone]  Need verbal  2x a month for catheter change

## 2017-07-10 NOTE — Telephone Encounter (Signed)
Spoke with Joseph Art and gave verbal orders per MD

## 2017-07-16 ENCOUNTER — Ambulatory Visit (INDEPENDENT_AMBULATORY_CARE_PROVIDER_SITE_OTHER): Payer: Medicare Other | Admitting: General Practice

## 2017-07-16 DIAGNOSIS — Z5181 Encounter for therapeutic drug level monitoring: Secondary | ICD-10-CM

## 2017-07-16 DIAGNOSIS — G8250 Quadriplegia, unspecified: Secondary | ICD-10-CM | POA: Diagnosis not present

## 2017-07-16 DIAGNOSIS — L89892 Pressure ulcer of other site, stage 2: Secondary | ICD-10-CM | POA: Diagnosis not present

## 2017-07-16 DIAGNOSIS — L89612 Pressure ulcer of right heel, stage 2: Secondary | ICD-10-CM | POA: Diagnosis not present

## 2017-07-16 DIAGNOSIS — L89622 Pressure ulcer of left heel, stage 2: Secondary | ICD-10-CM | POA: Diagnosis not present

## 2017-07-16 DIAGNOSIS — Z7901 Long term (current) use of anticoagulants: Secondary | ICD-10-CM | POA: Diagnosis not present

## 2017-07-16 LAB — POCT INR: INR: 3.1

## 2017-07-16 NOTE — Patient Instructions (Signed)
Pre visit review using our clinic review tool, if applicable. No additional management support is needed unless otherwise documented below in the visit note. 

## 2017-07-16 NOTE — Progress Notes (Signed)
I agree with this plan.

## 2017-07-19 DIAGNOSIS — R339 Retention of urine, unspecified: Secondary | ICD-10-CM | POA: Diagnosis not present

## 2017-07-19 DIAGNOSIS — I82409 Acute embolism and thrombosis of unspecified deep veins of unspecified lower extremity: Secondary | ICD-10-CM | POA: Diagnosis not present

## 2017-07-20 DIAGNOSIS — L89612 Pressure ulcer of right heel, stage 2: Secondary | ICD-10-CM | POA: Diagnosis not present

## 2017-07-24 DIAGNOSIS — N319 Neuromuscular dysfunction of bladder, unspecified: Secondary | ICD-10-CM | POA: Diagnosis not present

## 2017-07-24 DIAGNOSIS — G8250 Quadriplegia, unspecified: Secondary | ICD-10-CM | POA: Diagnosis not present

## 2017-07-24 DIAGNOSIS — E038 Other specified hypothyroidism: Secondary | ICD-10-CM | POA: Diagnosis not present

## 2017-07-30 ENCOUNTER — Other Ambulatory Visit: Payer: Self-pay | Admitting: Internal Medicine

## 2017-07-31 NOTE — Telephone Encounter (Signed)
RX faxed to [facility] 

## 2017-07-31 NOTE — Telephone Encounter (Signed)
[place] Controlled Substance Database checked. Last filled on 06/28/17

## 2017-08-03 DIAGNOSIS — Z86718 Personal history of other venous thrombosis and embolism: Secondary | ICD-10-CM | POA: Diagnosis not present

## 2017-08-03 DIAGNOSIS — Z7901 Long term (current) use of anticoagulants: Secondary | ICD-10-CM | POA: Diagnosis not present

## 2017-08-06 LAB — POCT INR: INR: 2.2

## 2017-08-07 ENCOUNTER — Ambulatory Visit (INDEPENDENT_AMBULATORY_CARE_PROVIDER_SITE_OTHER): Payer: Medicare Other | Admitting: General Practice

## 2017-08-07 DIAGNOSIS — G8250 Quadriplegia, unspecified: Secondary | ICD-10-CM | POA: Diagnosis not present

## 2017-08-07 DIAGNOSIS — E038 Other specified hypothyroidism: Secondary | ICD-10-CM | POA: Diagnosis not present

## 2017-08-07 DIAGNOSIS — Z5181 Encounter for therapeutic drug level monitoring: Secondary | ICD-10-CM | POA: Diagnosis not present

## 2017-08-07 DIAGNOSIS — N319 Neuromuscular dysfunction of bladder, unspecified: Secondary | ICD-10-CM | POA: Diagnosis not present

## 2017-08-07 NOTE — Progress Notes (Signed)
I have reviewed and agree with the plan. 

## 2017-08-07 NOTE — Patient Instructions (Signed)
Pre visit review using our clinic review tool, if applicable. No additional management support is needed unless otherwise documented below in the visit note. 

## 2017-08-13 DIAGNOSIS — L89212 Pressure ulcer of right hip, stage 2: Secondary | ICD-10-CM | POA: Diagnosis not present

## 2017-08-13 DIAGNOSIS — L89222 Pressure ulcer of left hip, stage 2: Secondary | ICD-10-CM | POA: Diagnosis not present

## 2017-08-13 DIAGNOSIS — G8250 Quadriplegia, unspecified: Secondary | ICD-10-CM | POA: Diagnosis not present

## 2017-08-13 DIAGNOSIS — L89622 Pressure ulcer of left heel, stage 2: Secondary | ICD-10-CM | POA: Diagnosis not present

## 2017-08-17 DIAGNOSIS — L89612 Pressure ulcer of right heel, stage 2: Secondary | ICD-10-CM | POA: Diagnosis not present

## 2017-08-21 ENCOUNTER — Ambulatory Visit (INDEPENDENT_AMBULATORY_CARE_PROVIDER_SITE_OTHER): Payer: Medicare Other | Admitting: General Practice

## 2017-08-21 DIAGNOSIS — G8250 Quadriplegia, unspecified: Secondary | ICD-10-CM | POA: Diagnosis not present

## 2017-08-21 DIAGNOSIS — N319 Neuromuscular dysfunction of bladder, unspecified: Secondary | ICD-10-CM | POA: Diagnosis not present

## 2017-08-21 DIAGNOSIS — Z5181 Encounter for therapeutic drug level monitoring: Secondary | ICD-10-CM

## 2017-08-21 DIAGNOSIS — Z7901 Long term (current) use of anticoagulants: Secondary | ICD-10-CM | POA: Insufficient documentation

## 2017-08-21 DIAGNOSIS — E038 Other specified hypothyroidism: Secondary | ICD-10-CM | POA: Diagnosis not present

## 2017-08-21 LAB — POCT INR: INR: 2

## 2017-08-21 NOTE — Patient Instructions (Signed)
Pre visit review using our clinic review tool, if applicable. No additional management support is needed unless otherwise documented below in the visit note. 

## 2017-08-23 NOTE — Progress Notes (Signed)
Agree with INR management  Joscelyne Renville J Maylani Embree, MD  

## 2017-08-31 ENCOUNTER — Telehealth: Payer: Self-pay | Admitting: Internal Medicine

## 2017-08-31 NOTE — Telephone Encounter (Signed)
Patient has changed 6 month fu for 10/29 to a nurse flu shot.  She wants to know if Dr. Quay Burow will enter labs to check on her thyroid, potassium and cholesterol.  Also states that she checks INR at home but company wants patient to get INR checked to compare to the INR she is getting at home.

## 2017-08-31 NOTE — Telephone Encounter (Signed)
Please advise, im not sure why appt was cancelled.

## 2017-09-01 NOTE — Telephone Encounter (Signed)
She should be seen twice a year by me given the medications she is on.

## 2017-09-03 ENCOUNTER — Telehealth: Payer: Self-pay | Admitting: Emergency Medicine

## 2017-09-03 NOTE — Telephone Encounter (Signed)
Twice a year visits is in compliance with new laws and rules for anyone on medications like xanax

## 2017-09-03 NOTE — Telephone Encounter (Signed)
Spoke with pt, she states she has only been coming once a year in the past. I advised per AVS you asked her to follow-up in 6 months. Pt states she only asked to come back for flu shot.

## 2017-09-03 NOTE — Telephone Encounter (Signed)
Ria Comment called and asked that your call her back about the patients wheelchair an urology order. Thanks.

## 2017-09-03 NOTE — Telephone Encounter (Signed)
Spoke with Ria Comment, order form was received for Urology supplies. Will fax over with MDs signature.

## 2017-09-07 DIAGNOSIS — N319 Neuromuscular dysfunction of bladder, unspecified: Secondary | ICD-10-CM | POA: Diagnosis not present

## 2017-09-07 DIAGNOSIS — E038 Other specified hypothyroidism: Secondary | ICD-10-CM | POA: Diagnosis not present

## 2017-09-07 DIAGNOSIS — G8250 Quadriplegia, unspecified: Secondary | ICD-10-CM | POA: Diagnosis not present

## 2017-09-07 NOTE — Telephone Encounter (Signed)
Spoke with pt letting her know that she will need to see Dr Quay Burow every 6 months. She said that she will call her sister to see when she can bring her.

## 2017-09-11 ENCOUNTER — Ambulatory Visit (INDEPENDENT_AMBULATORY_CARE_PROVIDER_SITE_OTHER): Payer: Medicare Other | Admitting: General Practice

## 2017-09-11 DIAGNOSIS — Z7901 Long term (current) use of anticoagulants: Secondary | ICD-10-CM

## 2017-09-11 LAB — POCT INR: INR: 2

## 2017-09-11 NOTE — Patient Instructions (Signed)
Pre visit review using our clinic review tool, if applicable. No additional management support is needed unless otherwise documented below in the visit note. 

## 2017-09-13 NOTE — Progress Notes (Signed)
Agree with management.  Gunter Conde J Stellarose Cerny, MD  

## 2017-09-19 DIAGNOSIS — L89612 Pressure ulcer of right heel, stage 2: Secondary | ICD-10-CM | POA: Diagnosis not present

## 2017-09-24 ENCOUNTER — Ambulatory Visit (INDEPENDENT_AMBULATORY_CARE_PROVIDER_SITE_OTHER): Payer: Medicare Other | Admitting: Adult Health

## 2017-09-24 ENCOUNTER — Encounter: Payer: Self-pay | Admitting: Adult Health

## 2017-09-24 DIAGNOSIS — I82402 Acute embolism and thrombosis of unspecified deep veins of left lower extremity: Secondary | ICD-10-CM | POA: Diagnosis not present

## 2017-09-24 DIAGNOSIS — R292 Abnormal reflex: Secondary | ICD-10-CM

## 2017-09-24 DIAGNOSIS — K5900 Constipation, unspecified: Secondary | ICD-10-CM | POA: Diagnosis not present

## 2017-09-24 DIAGNOSIS — N319 Neuromuscular dysfunction of bladder, unspecified: Secondary | ICD-10-CM

## 2017-09-24 DIAGNOSIS — Z7901 Long term (current) use of anticoagulants: Secondary | ICD-10-CM | POA: Diagnosis not present

## 2017-09-24 DIAGNOSIS — F418 Other specified anxiety disorders: Secondary | ICD-10-CM

## 2017-09-24 DIAGNOSIS — G894 Chronic pain syndrome: Secondary | ICD-10-CM | POA: Diagnosis not present

## 2017-09-24 DIAGNOSIS — Z0000 Encounter for general adult medical examination without abnormal findings: Secondary | ICD-10-CM
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
## 2017-09-24 NOTE — Patient Instructions (Signed)
Heart-Healthy Eating Plan Many factors influence your heart health, including eating and exercise habits. Heart (coronary) risk increases with abnormal blood fat (lipid) levels. Heart-healthy meal planning includes limiting unhealthy fats, increasing healthy fats, and making other small dietary changes. This includes maintaining a healthy body weight to help keep lipid levels within a normal range. What is my plan? Your health care provider recommends that you:  Get no more than __25__% of the total calories in your daily diet from fat.  Limit your intake of saturated fat to less than __5__% of your total calories each day.  Limit the amount of cholesterol in your diet to less than __300__ mg per day.  What types of fat should I choose?  Choose healthy fats more often. Choose monounsaturated and polyunsaturated fats, such as olive oil and canola oil, flaxseeds, walnuts, almonds, and seeds.  Eat more omega-3 fats. Good choices include salmon, mackerel, sardines, tuna, flaxseed oil, and ground flaxseeds. Aim to eat fish at least two times each week.  Limit saturated fats. Saturated fats are primarily found in animal products, such as meats, butter, and cream. Plant sources of saturated fats include palm oil, palm kernel oil, and coconut oil.  Avoid foods with partially hydrogenated oils in them. These contain trans fats. Examples of foods that contain trans fats are stick margarine, some tub margarines, cookies, crackers, and other baked goods. What general guidelines do I need to follow?  Check food labels carefully to identify foods with trans fats or high amounts of saturated fat.  Fill one half of your plate with vegetables and green salads. Eat 4-5 servings of vegetables per day. A serving of vegetables equals 1 cup of raw leafy vegetables,  cup of raw or cooked cut-up vegetables, or  cup of vegetable juice.  Fill one fourth of your plate with whole grains. Look for the word "whole" as  the first word in the ingredient list.  Fill one fourth of your plate with lean protein foods.  Eat 4-5 servings of fruit per day. A serving of fruit equals one medium whole fruit,  cup of dried fruit,  cup of fresh, frozen, or canned fruit, or  cup of 100% fruit juice.  Eat more foods that contain soluble fiber. Examples of foods that contain this type of fiber are apples, broccoli, carrots, beans, peas, and barley. Aim to get 20-30 g of fiber per day.  Eat more home-cooked food and less restaurant, buffet, and fast food.  Limit or avoid alcohol.  Limit foods that are high in starch and sugar.  Avoid fried foods.  Cook foods by using methods other than frying. Baking, boiling, grilling, and broiling are all great options. Other fat-reducing suggestions include: ? Removing the skin from poultry. ? Removing all visible fats from meats. ? Skimming the fat off of stews, soups, and gravies before serving them. ? Steaming vegetables in water or broth.  Lose weight if you are overweight. Losing just 5-10% of your initial body weight can help your overall health and prevent diseases such as diabetes and heart disease.  Increase your consumption of nuts, legumes, and seeds to 4-5 servings per week. One serving of dried beans or legumes equals  cup after being cooked, one serving of nuts equals 1 ounces, and one serving of seeds equals  ounce or 1 tablespoon.  You may need to monitor your salt (sodium) intake, especially if you have high blood pressure. Talk with your health care provider or dietitian to get  more information about reducing sodium. What foods can I eat? Grains  Breads, including [place], white, pita, wheat, raisin, rye, oatmeal, and [place]. Tortillas that are neither fried nor made with lard or trans fat. Low-fat rolls, including hotdog and hamburger buns and English muffins. Biscuits. Muffins. Waffles. Pancakes. Light popcorn. Whole-grain cereals. Flatbread. Melba toast.  Pretzels. Breadsticks. Rusks. Low-fat snacks and crackers, including oyster, saltine, matzo, graham, animal, and rye. Rice and pasta, including brown rice and those that are made with whole wheat. Vegetables All vegetables. Fruits All fruits, but limit coconut. Meats and Other Protein Sources Lean, well-trimmed beef, veal, pork, and lamb. Chicken and [place] without skin. All fish and shellfish. Wild duck, rabbit, pheasant, and venison. Egg whites or low-cholesterol egg substitutes. Dried beans, peas, lentils, and tofu.Seeds and most nuts. Dairy Low-fat or nonfat cheeses, including ricotta, string, and mozzarella. Skim or 1% milk that is liquid, powdered, or evaporated. Buttermilk that is made with low-fat milk. Nonfat or low-fat yogurt. Beverages Mineral water. Diet carbonated beverages. Sweets and Desserts Sherbets and fruit ices. Honey, jam, marmalade, jelly, and syrups. Meringues and gelatins. Pure sugar candy, such as hard candy, jelly beans, gumdrops, mints, marshmallows, and small amounts of dark chocolate. [employer] Eat all sweets and desserts in moderation. Fats and Oils Nonhydrogenated (trans-free) margarines. Vegetable oils, including soybean, sesame, sunflower, olive, peanut, safflower, corn, canola, and cottonseed. Salad dressings or mayonnaise that are made with a vegetable oil. Limit added fats and oils that you use for cooking, baking, salads, and as spreads. Other Cocoa powder. Coffee and tea. All seasonings and condiments. The items listed above may not be a complete list of recommended foods or beverages. Contact your dietitian for more options. What foods are not recommended? Grains Breads that are made with saturated or trans fats, oils, or whole milk. Croissants. Butter rolls. Cheese breads. Sweet rolls. Donuts. Buttered popcorn. Chow mein noodles. High-fat crackers, such as cheese or butter crackers. Meats and Other Protein Sources Fatty meats, such as hotdogs,  short ribs, sausage, spareribs, bacon, ribeye roast or steak, and mutton. High-fat deli meats, such as salami and bologna. Caviar. Domestic duck and goose. Organ meats, such as kidney, liver, sweetbreads, brains, gizzard, chitterlings, and heart. Dairy Cream, sour cream, cream cheese, and creamed cottage cheese. Whole milk cheeses, including blue (bleu), Monte[place] Jack, [place], [place], [place], [place], Swiss, [place], [place], and [place]. Whole or 2% milk that is liquid, evaporated, or condensed. Whole buttermilk. Cream sauce or high-fat cheese sauce. Yogurt that is made from whole milk. Beverages Regular sodas and drinks with added sugar. Sweets and Desserts Frosting. Pudding. Cookies. Cakes other than angel food cake. Candy that has milk chocolate or white chocolate, hydrogenated fat, butter, coconut, or unknown ingredients. Buttered syrups. Full-fat ice cream or ice cream drinks. Fats and Oils Gravy that has suet, meat fat, or shortening. Cocoa butter, hydrogenated oils, palm oil, coconut oil, palm kernel oil. These can often be found in baked products, candy, fried foods, nondairy creamers, and whipped toppings. Solid fats and shortenings, including bacon fat, salt pork, lard, and butter. Nondairy cream substitutes, such as coffee creamers and sour cream substitutes. Salad dressings that are made of unknown oils, cheese, or sour cream. The items listed above may not be a complete list of foods and beverages to avoid. Contact your dietitian for more information. This information is not intended to replace advice given to you by your health care provider. Make sure you discuss any questions you have with your health care  provider. Document Released: 08/29/2008 Document Revised: 06/09/2016 Document Reviewed: 05/14/2014 Elsevier Interactive Patient Education  2017 [employer].  Please continue all medications as directed. Continue drinking plenty of fluids and follow heart healthy diet. Please  schedule complete physical in the next 2-3 weeks. Please call us when you confirm dosage on your daily Bactrim medication. Please continue monthly follow-up with wound care. We will call you when our practice manager has guidance on how to proceed with future INR monitoring. WELCOME TO THE PRACTICE!

## 2017-09-25 ENCOUNTER — Telehealth: Payer: Self-pay

## 2017-09-25 DIAGNOSIS — E038 Other specified hypothyroidism: Secondary | ICD-10-CM | POA: Diagnosis not present

## 2017-09-25 DIAGNOSIS — N319 Neuromuscular dysfunction of bladder, unspecified: Secondary | ICD-10-CM | POA: Diagnosis not present

## 2017-09-25 DIAGNOSIS — G8250 Quadriplegia, unspecified: Secondary | ICD-10-CM | POA: Diagnosis not present

## 2017-09-25 DIAGNOSIS — Z0000 Encounter for general adult medical examination without abnormal findings: Secondary | ICD-10-CM | POA: Insufficient documentation

## 2017-09-25 LAB — POCT INR: INR: 2.8

## 2017-09-25 NOTE — Assessment & Plan Note (Signed)
Bowel Program once a week to evacuate GI tract. Home Health visits 3 times a week to assist with ADLs, hygiene and perform bowel progam. [employer]: [employer]
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
## 2017-09-25 NOTE — Assessment & Plan Note (Signed)
Last home INR reading 2.0 She routinely checks at home and calls PCP every 2 weeks to adjust warfarin if needed. Current Warfarin schedule 4mg  MW/F 2mg  T/R/Sat/Sun Per Practice Manager Lacretia Nicks), she can continue to check home INR and call clinic with readings to manage life long warfarin. Pt updated.

## 2017-09-25 NOTE — Assessment & Plan Note (Signed)
CPE with labs completed 03/2017 Recommend regular f/u 6 months, sooner if needed. Continue with Wound Care and [place]

## 2017-09-25 NOTE — Telephone Encounter (Signed)
Called pt and informed her that, per Lacretia Nicks- practice administrator, she may call in INRs every 2 weeks directly to our office for anticoagulation management.  Also advised pt that, per [facility], she does not need to RTC until 03/2018 for a CPE, then f/u every 6 months.  Also confirmed dosage of Bactrim and added to medication list.  [name], CMA

## 2017-09-25 NOTE — Assessment & Plan Note (Signed)
Suprapubic cath with hx of recurrent UTI. PRN Valium 5mg  for bladder spasms. Last contact with Urology 05/2015- she reports requiring written rx's from PCP for supplies

## 2017-09-26 ENCOUNTER — Ambulatory Visit (INDEPENDENT_AMBULATORY_CARE_PROVIDER_SITE_OTHER): Payer: Medicare Other | Admitting: General Practice

## 2017-09-26 DIAGNOSIS — Z7901 Long term (current) use of anticoagulants: Secondary | ICD-10-CM | POA: Diagnosis not present

## 2017-09-26 NOTE — Patient Instructions (Signed)
Pre visit review using our clinic review tool, if applicable. No additional management support is needed unless otherwise documented below in the visit note. 

## 2017-10-01 ENCOUNTER — Ambulatory Visit: Payer: Medicare Other

## 2017-10-01 ENCOUNTER — Ambulatory Visit: Payer: Medicare Other | Admitting: Internal Medicine

## 2017-10-01 DIAGNOSIS — G8250 Quadriplegia, unspecified: Secondary | ICD-10-CM | POA: Diagnosis not present

## 2017-10-01 DIAGNOSIS — L89622 Pressure ulcer of left heel, stage 2: Secondary | ICD-10-CM | POA: Diagnosis not present

## 2017-10-01 DIAGNOSIS — L89222 Pressure ulcer of left hip, stage 2: Secondary | ICD-10-CM | POA: Diagnosis not present

## 2017-10-01 DIAGNOSIS — L89892 Pressure ulcer of other site, stage 2: Secondary | ICD-10-CM | POA: Diagnosis not present

## 2017-10-01 DIAGNOSIS — L089 Local infection of the skin and subcutaneous tissue, unspecified: Secondary | ICD-10-CM | POA: Diagnosis not present

## 2017-10-01 DIAGNOSIS — L89612 Pressure ulcer of right heel, stage 2: Secondary | ICD-10-CM | POA: Diagnosis not present

## 2017-10-03 DIAGNOSIS — L89612 Pressure ulcer of right heel, stage 2: Secondary | ICD-10-CM | POA: Diagnosis not present

## 2017-10-05 DIAGNOSIS — E038 Other specified hypothyroidism: Secondary | ICD-10-CM | POA: Diagnosis not present

## 2017-10-05 DIAGNOSIS — N319 Neuromuscular dysfunction of bladder, unspecified: Secondary | ICD-10-CM | POA: Diagnosis not present

## 2017-10-05 DIAGNOSIS — G8250 Quadriplegia, unspecified: Secondary | ICD-10-CM | POA: Diagnosis not present

## 2017-10-09 ENCOUNTER — Ambulatory Visit (INDEPENDENT_AMBULATORY_CARE_PROVIDER_SITE_OTHER): Payer: Medicare Other | Admitting: General Practice

## 2017-10-09 ENCOUNTER — Ambulatory Visit: Payer: Self-pay | Admitting: General Practice

## 2017-10-09 DIAGNOSIS — Z7901 Long term (current) use of anticoagulants: Secondary | ICD-10-CM | POA: Diagnosis not present

## 2017-10-09 LAB — POCT INR: INR: 2.6

## 2017-10-09 NOTE — Patient Instructions (Signed)
Pre visit review using our clinic review tool, if applicable. No additional management support is needed unless otherwise documented below in the visit note. 

## 2017-10-10 ENCOUNTER — Telehealth: Payer: Self-pay | Admitting: Adult Health

## 2017-10-10 NOTE — Telephone Encounter (Signed)
Mark with a [place] monitoring agency that works with patient wants to speak with someone clinical about getting updated orders for patient. You can reach [place] at [phone].

## 2017-10-11 NOTE — Telephone Encounter (Signed)
Received written orders to be signed by provider.  [name], CMA

## 2017-10-18 ENCOUNTER — Encounter: Payer: Medicare Other | Admitting: Adult Health

## 2017-10-29 ENCOUNTER — Encounter: Payer: Self-pay | Admitting: Adult Health

## 2017-10-29 LAB — POCT INR: INR: 2.2

## 2017-10-30 ENCOUNTER — Telehealth: Payer: Self-pay | Admitting: Adult Health

## 2017-10-30 ENCOUNTER — Other Ambulatory Visit: Payer: Self-pay

## 2017-10-30 MED ORDER — WARFARIN SODIUM 2 MG PO TABS: ORAL_TABLET | ORAL | 2 refills | Status: DC

## 2017-10-30 MED ORDER — LEVOTHYROXINE SODIUM 50 MCG PO TABS: 50.0000 ug | ORAL_TABLET | Freq: Every day | ORAL | 5 refills | Status: DC

## 2017-10-30 MED ORDER — ALBUTEROL SULFATE HFA 108 (90 BASE) MCG/ACT IN AERS: 2.0000 | INHALATION_SPRAY | Freq: Four times a day (QID) | RESPIRATORY_TRACT | 3 refills | Status: DC | PRN

## 2017-10-30 MED ORDER — POTASSIUM CHLORIDE CRYS ER 20 MEQ PO TBCR: 20.0000 meq | EXTENDED_RELEASE_TABLET | Freq: Every day | ORAL | 1 refills | Status: DC

## 2017-10-30 MED ORDER — DIAZEPAM 5 MG PO TABS: 5.0000 mg | ORAL_TABLET | Freq: Two times a day (BID) | ORAL | 0 refills | Status: DC | PRN

## 2017-10-30 MED ORDER — WARFARIN SODIUM 4 MG PO TABS: ORAL_TABLET | ORAL | 1 refills | Status: DC

## 2017-10-30 MED ORDER — OLOPATADINE HCL 0.1 % OP SOLN: OPHTHALMIC | 3 refills | Status: DC

## 2017-10-30 MED ORDER — PRAVASTATIN SODIUM 40 MG PO TABS: 40.0000 mg | ORAL_TABLET | Freq: Every day | ORAL | 1 refills | Status: DC

## 2017-10-30 MED ORDER — BUMETANIDE 1 MG PO TABS: 1.0000 mg | ORAL_TABLET | Freq: Every day | ORAL | 1 refills | Status: DC

## 2017-10-30 MED ORDER — BACLOFEN 20 MG PO TABS: ORAL_TABLET | ORAL | 1 refills | Status: DC

## 2017-10-30 MED ORDER — OXYBUTYNIN CHLORIDE 5 MG PO TABS: 5.0000 mg | ORAL_TABLET | Freq: Two times a day (BID) | ORAL | 1 refills | Status: DC

## 2017-10-30 NOTE — Telephone Encounter (Signed)
Patient states it is time for her Rx refill--advised her to call her pharmacy to send [place] refill requests--pt states she has and they told her to call us since she now has a New PCP(we have to approve Rx refill)--forwarding message to medical assistant--- pt states really needs abuterol inhaler ( 18 ZENTOLIN HFA-90 MCG [place]) pt did not list inhaler on medication form----- Pt uses pharmacy ([facility] in [place], [place])---pls call patient if questions. --glh

## 2017-10-30 NOTE — Telephone Encounter (Signed)
Albuterol is listed under "history".  We have not prescribed this medication for the patient previously.  Please review and refill if appropriate.  [name], CMA

## 2017-10-30 NOTE — Telephone Encounter (Signed)
Afternoon [name], Fine to refill Albuterol. Thanks! [name]

## 2017-10-30 NOTE — Addendum Note (Signed)
Addended by: Fonnie Mu on: 10/30/2017 04:54 PM   Modules accepted: Orders

## 2017-10-30 NOTE — Telephone Encounter (Signed)
We have not prescribed these medications for the patient previously.  Please review and refill if appropriate.  T. Jakeya Gherardi, CMA  

## 2017-11-06 ENCOUNTER — Telehealth: Payer: Self-pay | Admitting: Adult Health

## 2017-11-06 NOTE — Telephone Encounter (Signed)
LVM for Helene Kelp with home health giving verbal orders to continue cath replacement twice monthly.  [name], CMA

## 2017-11-06 NOTE — Telephone Encounter (Signed)
[name] with patient's home health needs verbal orders to continue her cath replacement twice a month. She can be reached at [phone]

## 2017-11-13 ENCOUNTER — Encounter: Payer: Self-pay | Admitting: Adult Health

## 2017-11-13 LAB — POCT INR: INR: 2.8

## 2017-11-15 ENCOUNTER — Telehealth: Payer: Self-pay

## 2017-11-15 DIAGNOSIS — Z7901 Long term (current) use of anticoagulants: Secondary | ICD-10-CM | POA: Diagnosis not present

## 2017-11-15 NOTE — Telephone Encounter (Signed)
Spoke with pt and informed her that we received INR reading from [place] and that she is to continue her current dose of therapy.  Pt expressed understanding and is agreeable.  [name], CMA

## 2017-11-19 ENCOUNTER — Telehealth: Payer: Self-pay | Admitting: Adult Health

## 2017-11-19 ENCOUNTER — Telehealth: Payer: Self-pay

## 2017-11-19 NOTE — Telephone Encounter (Signed)
Renee with HH called stating that she is at the patient's home to do a cath change and the patient is stating that she believes she has a UTI.  Renee requests verbal order for UA.  Verbal order provided.  [name], CMA

## 2017-11-19 NOTE — Telephone Encounter (Signed)
Rcvd call from [name] [phone] requesting permission to collect a urine specimen, patient thinks she has an  UTI--pls call home health nurse as soon as possible @ [phone].  --glh

## 2017-11-20 ENCOUNTER — Telehealth: Payer: Self-pay

## 2017-11-20 ENCOUNTER — Other Ambulatory Visit: Payer: Self-pay

## 2017-11-20 DIAGNOSIS — N390 Urinary tract infection, site not specified: Secondary | ICD-10-CM

## 2017-11-20 DIAGNOSIS — Z9189 Other specified personal risk factors, not elsewhere classified: Secondary | ICD-10-CM

## 2017-11-20 LAB — URINALYSIS
BILIRUBIN: NEGATIVE
GLUCOSE: NEGATIVE
KETONE: NEGATIVE
Nitrite: NEGATIVE
Specific Gravity: 1.005
UROBILINOGEN UA: NORMAL
pH: 6

## 2017-11-20 NOTE — Telephone Encounter (Signed)
Advised Renee with [facility] to please add urine culture with sensitivities to urine collected on 11/19/17.  [name], CMA

## 2017-11-21 ENCOUNTER — Telehealth: Payer: Self-pay | Admitting: Adult Health

## 2017-11-21 NOTE — Telephone Encounter (Signed)
Advised pt that we will call with results and recommendations once [name] has reviewed the results.  [name], CMA

## 2017-11-21 NOTE — Telephone Encounter (Signed)
Called patient to set up urology referral and she states she will call her est urology doc herself for this UTI problem. She would however like to speak with someone clinical about her urine culture results today

## 2017-11-22 ENCOUNTER — Other Ambulatory Visit: Payer: Self-pay | Admitting: Adult Health

## 2017-11-22 ENCOUNTER — Telehealth: Payer: Self-pay

## 2017-11-22 NOTE — Progress Notes (Unsigned)
Received UA with C/S

## 2017-11-22 NOTE — Telephone Encounter (Signed)
Patient was notified that he appointment with Dr. Rosana Hoes is in March 2019 and per Stanton Kidney at his office she needs to contact them so that appointment can be changed to a earlier date.  Patient advised that it is VERY important that she follows up with urology as soon as possible. MPulliam, CMA/RT(R)

## 2017-11-22 NOTE — Telephone Encounter (Signed)
Per [name] Danford's instructions contacted patient's urologist - Dr. Rosana Hoes at [facility] concerning her recent UA results and urine culture results were faxed to their office.  Spoke with Stanton Kidney, his nurse and advised that our office was requesting that Dr. Rosana Hoes review and treat the UTI.  Per Stanton Kidney, their office is unable to treat due to the fact that they have not seen the patient since 10/2015.  I requested recommendation on treat per [facility].  Stanton Kidney states that due to the results being positive for the organism Pseudomonas aeruginosa that I gave her over the phone - per their office's protocol patient's are treated with one of the IV antibiotics listed as sensitive and this is done after a Pic line is placed and is infused every 12 hours. Infectious diease department is contacted at their office.  Stanton Kidney states that she will give the faxed results to Dr. Rosana Hoes and see if there is any further recommendations that he may have for the patient and will call our office back.   Mary at Dr. Shann Medal office called me back at 2pm.  Per Dr. Rosana Hoes if the patient is having fevers then she needs to go to the hospital for treatment as stated above.  If she is not having any fevers then she needs no treatment because her ST tube is changed at least every 30 days and that will help with the infection.  Called the patient and ask if she has any fever, she states that she has not had any fevers.  She states that her ST cath tube is changed every 15 days.  I advised the patient of the recommendations of Dr. Rosana Hoes and [name].  Patient is to continue to monitor her temperture and if she begins to run a fever then she is to go to the ED at [facility] for treatment.  Patient was notified that the culture was positive for growth and that the antibiotics that it is senstive to are IV treatments through a PIC line and that would have to be done at the hospital.  Patient expressed understanding and a copy of the results were faxed to the  patient in case she has to precede to the ED over the time that our office will be closed.   MPulliam, CMA/RT(R)

## 2017-11-22 NOTE — Telephone Encounter (Signed)
[name] denies fever and per Dr. Rosana Hoes' direction, if she is afebrile IV ABX tx isn't necessary. Pt called/updated and instructed to make OV with Dr. Rosana Hoes, her last contact with their office was 2016 Thank you Melissa for your diligent work on addressing this!

## 2017-11-28 ENCOUNTER — Other Ambulatory Visit: Payer: Self-pay | Admitting: Adult Health

## 2017-11-30 ENCOUNTER — Telehealth: Payer: Self-pay

## 2017-11-30 DIAGNOSIS — Z7901 Long term (current) use of anticoagulants: Secondary | ICD-10-CM | POA: Diagnosis not present

## 2017-11-30 LAB — PROTIME-INR: INR: 2.1 — AB (ref 0.9–1.1)

## 2017-11-30 NOTE — Telephone Encounter (Signed)
Pt informed of INR results and to continue same dose of Coumadin.  Pt expressed understanding.  [name], CMA

## 2017-12-31 ENCOUNTER — Other Ambulatory Visit: Payer: Self-pay | Admitting: Adult Health

## 2017-12-31 NOTE — Telephone Encounter (Signed)
Last OV note states that she takes diazepam for bladder spasms.  Should she be getting this from urology?  T. Meda Coffee, CMA

## 2018-01-01 ENCOUNTER — Telehealth: Payer: Self-pay

## 2018-01-01 DIAGNOSIS — Z7901 Long term (current) use of anticoagulants: Secondary | ICD-10-CM | POA: Diagnosis not present

## 2018-01-01 LAB — CHG PROTHROMBIN TIME: INR: 2.6

## 2018-01-01 NOTE — Telephone Encounter (Signed)
Received INR results from [facility] on 12/25/17.  INR 2.6 on 12/24/17.  Pt informed to continue same dose of Coumadin and recheck in 2 weeks.  Pt expressed understanding and is agreeable.  [name], CMA

## 2018-01-07 DIAGNOSIS — Z7901 Long term (current) use of anticoagulants: Secondary | ICD-10-CM | POA: Diagnosis not present

## 2018-01-07 LAB — PROTIME-INR: INR: 2 — AB (ref 0.9–1.1)

## 2018-01-10 ENCOUNTER — Telehealth: Payer: Self-pay

## 2018-01-10 NOTE — Telephone Encounter (Signed)
Received fax from [place].  Pt's INR on 01/07/18 was 2.0.  Pt instructed to continue same dose of Coumadin and recheck in 2 weeks per [name], NP.  Pt expressed understanding and is agreeable.  [name], CMA

## 2018-01-22 ENCOUNTER — Telehealth: Payer: Self-pay

## 2018-01-22 DIAGNOSIS — Z7901 Long term (current) use of anticoagulants: Secondary | ICD-10-CM | POA: Diagnosis not present

## 2018-01-22 LAB — PROTIME-INR: INR: 2.5 — AB (ref 0.9–1.1)

## 2018-01-22 NOTE — Telephone Encounter (Signed)
Thank you Continue current coumadin therapy Thanks! [name]

## 2018-01-22 NOTE — Telephone Encounter (Signed)
Pt informed of results.  Pt expressed understanding and is agreeable.  T. Marisabel Macpherson, CMA 

## 2018-01-22 NOTE — Telephone Encounter (Signed)
Received report for home monitoring of INRs.  INR on 01/22/18 was 2.5.  [name], CMA

## 2018-01-28 ENCOUNTER — Other Ambulatory Visit: Payer: Self-pay | Admitting: Adult Health

## 2018-01-30 ENCOUNTER — Other Ambulatory Visit: Payer: Self-pay

## 2018-02-02 ENCOUNTER — Other Ambulatory Visit: Payer: Self-pay | Admitting: Adult Health

## 2018-02-05 LAB — POCT INR: INR: 2 — AB (ref 0.9–1.1)

## 2018-02-07 ENCOUNTER — Telehealth: Payer: Self-pay

## 2018-02-07 DIAGNOSIS — Z7901 Long term (current) use of anticoagulants: Secondary | ICD-10-CM | POA: Diagnosis not present

## 2018-02-07 NOTE — Telephone Encounter (Signed)
INR of 2.0 on 02/05/18.  Advised pt to continue current dose of Coumadin.  Pt expressed understanding and is agreeable.  [name], CMA

## 2018-02-18 LAB — POCT INR: INR: 1.9 — AB (ref 0.9–1.1)

## 2018-02-21 ENCOUNTER — Telehealth: Payer: Self-pay

## 2018-02-21 ENCOUNTER — Telehealth: Payer: Self-pay | Admitting: Adult Health

## 2018-02-21 DIAGNOSIS — Z7901 Long term (current) use of anticoagulants: Secondary | ICD-10-CM | POA: Diagnosis not present

## 2018-02-21 NOTE — Telephone Encounter (Signed)
Discussed with pt.  See additional documentation on previous phone call encounter.  [name], CMA

## 2018-02-21 NOTE — Telephone Encounter (Signed)
Patient called wanting to discuss INRs. She said she took it on Monday and it was 1.9 and she was concerned she has not heard from anyone about it. She wanted me to make mention of her coumadin routine. She takes 4mg  on M/W/F and 2 mg on T/Th/Sat/Sun. Please contact patient at earliest opportunity

## 2018-02-21 NOTE — Telephone Encounter (Signed)
Discussed with pt INR from 02/18/18 of 1.9.  Pt states that she has eaten a bit more broccoli than normal.  Advised pt to be consistent with Vitamin K intake and that if her next INR is less than 2, then will we need to adjust her Coumadin dose.  Pt expressed understanding and is agreeable.  [name], CMA

## 2018-02-26 ENCOUNTER — Other Ambulatory Visit: Payer: Self-pay | Admitting: Adult Health

## 2018-02-27 NOTE — Telephone Encounter (Signed)
Patient last seen in office on 09/24/2017.  Please review. MPulliam, CMA/RT(R)

## 2018-03-04 LAB — POCT INR: INR: 2.3 — AB (ref <=1.1)

## 2018-03-06 DIAGNOSIS — Z7901 Long term (current) use of anticoagulants: Secondary | ICD-10-CM | POA: Diagnosis not present

## 2018-03-06 NOTE — Progress Notes (Signed)
Pt's INR on 03/04/18 was 2.3.  Per [name], NP pt instructed to continue current dose of Coumadin.  Pt expressed understanding and is agreeable.  [name], CMA

## 2018-03-19 ENCOUNTER — Telehealth: Payer: Self-pay

## 2018-03-19 DIAGNOSIS — Z7901 Long term (current) use of anticoagulants: Secondary | ICD-10-CM | POA: Diagnosis not present

## 2018-03-19 LAB — PT WITH INR/FINGERSTICK: INR: 3

## 2018-03-19 NOTE — Telephone Encounter (Signed)
Received fax with INR results of 3.0.  Pt informed to continue current dose of Coumadin, per [name], NP.  Pt expressed understanding and is agreeable.  [name], CMA
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
## 2018-03-25 ENCOUNTER — Encounter: Payer: Self-pay | Admitting: Adult Health

## 2018-03-25 ENCOUNTER — Ambulatory Visit (INDEPENDENT_AMBULATORY_CARE_PROVIDER_SITE_OTHER): Payer: Medicare Other | Admitting: Adult Health

## 2018-03-25 VITALS — BP 113/77 | HR 53

## 2018-03-25 DIAGNOSIS — R739 Hyperglycemia, unspecified: Secondary | ICD-10-CM | POA: Diagnosis not present

## 2018-03-25 DIAGNOSIS — E785 Hyperlipidemia, unspecified: Secondary | ICD-10-CM

## 2018-03-25 DIAGNOSIS — E559 Vitamin D deficiency, unspecified: Secondary | ICD-10-CM

## 2018-03-25 DIAGNOSIS — Z0000 Encounter for general adult medical examination without abnormal findings: Secondary | ICD-10-CM | POA: Diagnosis not present

## 2018-03-25 DIAGNOSIS — E039 Hypothyroidism, unspecified: Secondary | ICD-10-CM

## 2018-03-25 DIAGNOSIS — Z1231 Encounter for screening mammogram for malignant neoplasm of breast: Secondary | ICD-10-CM

## 2018-03-25 DIAGNOSIS — Z7901 Long term (current) use of anticoagulants: Secondary | ICD-10-CM

## 2018-03-25 DIAGNOSIS — Z5181 Encounter for therapeutic drug level monitoring: Secondary | ICD-10-CM

## 2018-03-25 DIAGNOSIS — Z1239 Encounter for other screening for malignant neoplasm of breast: Secondary | ICD-10-CM

## 2018-03-25 DIAGNOSIS — I959 Hypotension, unspecified: Secondary | ICD-10-CM | POA: Diagnosis not present

## 2018-03-25 MED ORDER — ACETAMINOPHEN-CODEINE #3 300-30 MG PO TABS: 1.0000 | ORAL_TABLET | ORAL | 0 refills | Status: DC | PRN

## 2018-03-25 NOTE — Assessment & Plan Note (Signed)
BP stable 113/77, HR 53

## 2018-03-25 NOTE — Assessment & Plan Note (Signed)
Will check TSH level with labs this week Currently on Levothyroxine 35mcg daily

## 2018-03-25 NOTE — Assessment & Plan Note (Signed)
Last INR 3 on 03/18/18 She completed at home every 2 weeks and faxes over results Current Warfarin schedule 4mg  MW/F 2mg  T/R/Sat/Sun

## 2018-03-25 NOTE — Assessment & Plan Note (Signed)
Please continue all medications as directed. Please continue with regular INR checks and sending information to clinic. Please schedule fasting lab appt this Friday. Regular follow-up 6 months, sooner if needed.

## 2018-03-25 NOTE — Patient Instructions (Signed)
Preventive Care for Adults, Female  A healthy lifestyle and preventive care can promote health and wellness. Preventive health guidelines for women include the following key practices.   A routine yearly physical is a good way to check with your health care provider about your health and preventive screening. It is a chance to share any concerns and updates on your health and to receive a thorough exam.   Visit your dentist for a routine exam and preventive care every 6 months. Brush your teeth twice a day and floss once a day. Good oral hygiene prevents tooth decay and gum disease.   The frequency of eye exams is based on your age, health, family medical history, use of contact lenses, and other factors. Follow your health care provider's recommendations for frequency of eye exams.   Eat a healthy diet. Foods like vegetables, fruits, whole grains, low-fat dairy products, and lean protein foods contain the nutrients you need without too many calories. Decrease your intake of foods high in solid fats, added sugars, and salt. Eat the right amount of calories for you.Get information about a proper diet from your health care provider, if necessary.   Regular physical exercise is one of the most important things you can do for your health. Most adults should get at least 150 minutes of moderate-intensity exercise (any activity that increases your heart rate and causes you to sweat) each week. In addition, most adults need muscle-strengthening exercises on 2 or more days a week.   Maintain a healthy weight. The body mass index (BMI) is a screening tool to identify possible weight problems. It provides an estimate of body fat based on height and weight. Your health care provider can find your BMI, and can help you achieve or maintain a healthy weight.For adults 20 years and older:   - A BMI below 18.5 is considered underweight.   - A BMI of 18.5 to 24.9 is normal.   - A BMI of 25 to 29.9 is  considered overweight.   - A BMI of 30 and above is considered obese.   Maintain normal blood lipids and cholesterol levels by exercising and minimizing your intake of trans and saturated fats.  Eat a balanced diet with plenty of fruit and vegetables. Blood tests for lipids and cholesterol should begin at age 20 and be repeated every 5 years minimum.  If your lipid or cholesterol levels are high, you are over 40, or you are at high risk for heart disease, you may need your cholesterol levels checked more frequently.Ongoing high lipid and cholesterol levels should be treated with medicines if diet and exercise are not working.   If you smoke, find out from your health care provider how to quit. If you do not use tobacco, do not start.   Lung cancer screening is recommended for adults aged 55-80 years who are at high risk for developing lung cancer because of a history of smoking. A yearly low-dose CT scan of the lungs is recommended for people who have at least a 30-pack-year history of smoking and are a current smoker or have quit within the past 15 years. A pack year of smoking is smoking an average of 1 pack of cigarettes a day for 1 year (for example: 1 pack a day for 30 years or 2 packs a day for 15 years). Yearly screening should continue until the smoker has stopped smoking for at least 15 years. Yearly screening should be stopped for people who develop a   health problem that would prevent them from having lung cancer treatment.   If you are pregnant, do not drink alcohol. If you are breastfeeding, be very cautious about drinking alcohol. If you are not pregnant and choose to drink alcohol, do not have more than 1 drink per day. One drink is considered to be 12 ounces (355 mL) of beer, 5 ounces (148 mL) of wine, or 1.5 ounces (44 mL) of liquor.   Avoid use of street drugs. Do not share needles with anyone. Ask for help if you need support or instructions about stopping the use of  drugs.   High blood pressure causes heart disease and increases the risk of stroke. Your blood pressure should be checked at least yearly.  Ongoing high blood pressure should be treated with medicines if weight loss and exercise do not work.   If you are 69-55 years old, ask your health care provider if you should take aspirin to prevent strokes.   Diabetes screening involves taking a blood sample to check your fasting blood sugar level. This should be done once every 3 years, after age 38, if you are within normal weight and without risk factors for diabetes. Testing should be considered at a younger age or be carried out more frequently if you are overweight and have at least 1 risk factor for diabetes.   Breast cancer screening is essential preventive care for women. You should practice "breast self-awareness."  This means understanding the normal appearance and feel of your breasts and may include breast self-examination.  Any changes detected, no matter how small, should be reported to a health care provider.  Women in their 80s and 30s should have a clinical breast exam (CBE) by a health care provider as part of a regular health exam every 1 to 3 years.  After age 66, women should have a CBE every year.  Starting at age 1, women should consider having a mammogram (breast X-ray test) every year.  Women who have a family history of breast cancer should talk to their health care provider about genetic screening.  Women at a high risk of breast cancer should talk to their health care providers about having an MRI and a mammogram every year.   -Breast cancer gene (BRCA)-related cancer risk assessment is recommended for women who have family members with BRCA-related cancers. BRCA-related cancers include breast, ovarian, tubal, and peritoneal cancers. Having family members with these cancers may be associated with an increased risk for harmful changes (mutations) in the breast cancer genes BRCA1 and  BRCA2. Results of the assessment will determine the need for genetic counseling and BRCA1 and BRCA2 testing.   The Pap test is a screening test for cervical cancer. A Pap test can show cell changes on the cervix that might become cervical cancer if left untreated. A Pap test is a procedure in which cells are obtained and examined from the lower end of the uterus (cervix).   - Women should have a Pap test starting at age 57.   - Between ages 90 and 70, Pap tests should be repeated every 2 years.   - Beginning at age 63, you should have a Pap test every 3 years as long as the past 3 Pap tests have been normal.   - Some women have medical problems that increase the chance of getting cervical cancer. Talk to your health care provider about these problems. It is especially important to talk to your health care provider if a  new problem develops soon after your last Pap test. In these cases, your health care provider may recommend more frequent screening and Pap tests.   - The above recommendations are the same for women who have or have not gotten the vaccine for human papillomavirus (HPV).   - If you had a hysterectomy for a problem that was not cancer or a condition that could lead to cancer, then you no longer need Pap tests. Even if you no longer need a Pap test, a regular exam is a good idea to make sure no other problems are starting.   - If you are between ages 36 and 66 years, and you have had normal Pap tests going back 10 years, you no longer need Pap tests. Even if you no longer need a Pap test, a regular exam is a good idea to make sure no other problems are starting.   - If you have had past treatment for cervical cancer or a condition that could lead to cancer, you need Pap tests and screening for cancer for at least 20 years after your treatment.   - If Pap tests have been discontinued, risk factors (such as a new sexual partner) need to be reassessed to determine if screening should  be resumed.   - The HPV test is an additional test that may be used for cervical cancer screening. The HPV test looks for the virus that can cause the cell changes on the cervix. The cells collected during the Pap test can be tested for HPV. The HPV test could be used to screen women aged 70 years and older, and should be used in women of any age who have unclear Pap test results. After the age of 67, women should have HPV testing at the same frequency as a Pap test.   Colorectal cancer can be detected and often prevented. Most routine colorectal cancer screening begins at the age of 57 years and continues through age 26 years. However, your health care provider may recommend screening at an earlier age if you have risk factors for colon cancer. On a yearly basis, your health care provider may provide home test kits to check for hidden blood in the stool.  Use of a small camera at the end of a tube, to directly examine the colon (sigmoidoscopy or colonoscopy), can detect the earliest forms of colorectal cancer. Talk to your health care provider about this at age 23, when routine screening begins. Direct exam of the colon should be repeated every 5 -10 years through age 49 years, unless early forms of pre-cancerous polyps or small growths are found.   People who are at an increased risk for hepatitis B should be screened for this virus. You are considered at high risk for hepatitis B if:  -You were born in a country where hepatitis B occurs often. Talk with your health care provider about which countries are considered high risk.  - Your parents were born in a high-risk country and you have not received a shot to protect against hepatitis B (hepatitis B vaccine).  - You have HIV or AIDS.  - You use needles to inject street drugs.  - You live with, or have sex with, someone who has Hepatitis B.  - You get hemodialysis treatment.  - You take certain medicines for conditions like cancer, organ  transplantation, and autoimmune conditions.   Hepatitis C blood testing is recommended for all people born from [birth year] through 1965 and any individual  with known risks for hepatitis C.   Practice safe sex. Use condoms and avoid high-risk sexual practices to reduce the spread of sexually transmitted infections (STIs). STIs include gonorrhea, chlamydia, syphilis, trichomonas, herpes, HPV, and human immunodeficiency virus (HIV). Herpes, HIV, and HPV are viral illnesses that have no cure. They can result in disability, cancer, and death. Sexually active women aged 25 years and younger should be checked for chlamydia. Older women with new or multiple partners should also be tested for chlamydia. Testing for other STIs is recommended if you are sexually active and at increased risk.   Osteoporosis is a disease in which the bones lose minerals and strength with aging. This can result in serious bone fractures or breaks. The risk of osteoporosis can be identified using a bone density scan. Women ages 65 years and over and women at risk for fractures or osteoporosis should discuss screening with their health care providers. Ask your health care provider whether you should take a calcium supplement or vitamin D to There are also several preventive steps women can take to avoid osteoporosis and resulting fractures or to keep osteoporosis from worsening. -->Recommendations include:  Eat a balanced diet high in fruits, vegetables, calcium, and vitamins.  Get enough calcium. The recommended total intake of is 1,200 mg daily; for best absorption, if taking supplements, divide doses into 250-500 mg doses throughout the day. Of the two types of calcium, calcium carbonate is best absorbed when taken with food but calcium citrate can be taken on an empty stomach.  Get enough vitamin D. NAMS and the National Osteoporosis Foundation recommend at least 1,000 IU per day for women age 50 and over who are at risk of vitamin D  deficiency. Vitamin D deficiency can be caused by inadequate sun exposure (for example, those who live in northern latitudes).  Avoid alcohol and smoking. Heavy alcohol intake (more than 7 drinks per week) increases the risk of falls and hip fracture and women smokers tend to lose bone more rapidly and have lower bone mass than nonsmokers. Stopping smoking is one of the most important changes women can make to improve their health and decrease risk for disease.  Be physically active every day. Weight-bearing exercise (for example, fast walking, hiking, jogging, and weight training) may strengthen bones or slow the rate of bone loss that comes with aging. Balancing and muscle-strengthening exercises can reduce the risk of falling and fracture.  Consider therapeutic medications. Currently, several types of effective drugs are available. Healthcare providers can recommend the type most appropriate for each woman.  Eliminate environmental factors that may contribute to accidents. Falls cause nearly 90% of all osteoporotic fractures, so reducing this risk is an important bone-health strategy. Measures include ample lighting, removing obstructions to walking, using nonskid rugs on floors, and placing mats and/or grab bars in showers.  Be aware of medication side effects. Some common medicines make bones weaker. These include a type of steroid drug called glucocorticoids used for arthritis and asthma, some antiseizure drugs, certain sleeping pills, treatments for endometriosis, and some cancer drugs. An overactive thyroid gland or using too much thyroid hormone for an underactive thyroid can also be a problem. If you are taking these medicines, talk to your doctor about what you can do to help protect your bones.reduce the rate of osteoporosis.    Menopause can be associated with physical symptoms and risks. Hormone replacement therapy is available to decrease symptoms and risks. You should talk to your  health care provider   about whether hormone replacement therapy is right for you.   Use sunscreen. Apply sunscreen liberally and repeatedly throughout the day. You should seek shade when your shadow is shorter than you. Protect yourself by wearing long sleeves, pants, a wide-brimmed hat, and sunglasses year round, whenever you are outdoors.   Once a month, do a whole body skin exam, using a mirror to look at the skin on your back. Tell your health care provider of new moles, moles that have irregular borders, moles that are larger than a pencil eraser, or moles that have changed in shape or color.   -Stay current with required vaccines (immunizations).   Influenza vaccine. All adults should be immunized every year.  Tetanus, diphtheria, and acellular pertussis (Td, Tdap) vaccine. Pregnant women should receive 1 dose of Tdap vaccine during each pregnancy. The dose should be obtained regardless of the length of time since the last dose. Immunization is preferred during the 27th 36th week of gestation. An adult who has not previously received Tdap or who does not know her vaccine status should receive 1 dose of Tdap. This initial dose should be followed by tetanus and diphtheria toxoids (Td) booster doses every 10 years. Adults with an unknown or incomplete history of completing a 3-dose immunization series with Td-containing vaccines should begin or complete a primary immunization series including a Tdap dose. Adults should receive a Td booster every 10 years.  Varicella vaccine. An adult without evidence of immunity to varicella should receive 2 doses or a second dose if she has previously received 1 dose. Pregnant females who do not have evidence of immunity should receive the first dose after pregnancy. This first dose should be obtained before leaving the health care facility. The second dose should be obtained 4 8 weeks after the first dose.  Human papillomavirus (HPV) vaccine. Females aged 13 26  years who have not received the vaccine previously should obtain the 3-dose series. The vaccine is not recommended for use in pregnant females. However, pregnancy testing is not needed before receiving a dose. If a female is found to be pregnant after receiving a dose, no treatment is needed. In that case, the remaining doses should be delayed until after the pregnancy. Immunization is recommended for any person with an immunocompromised condition through the age of 26 years if she did not get any or all doses earlier. During the 3-dose series, the second dose should be obtained 4 8 weeks after the first dose. The third dose should be obtained 24 weeks after the first dose and 16 weeks after the second dose.  Zoster vaccine. One dose is recommended for adults aged 60 years or older unless certain conditions are present.  Measles, mumps, and rubella (MMR) vaccine. Adults born before 1957 generally are considered immune to measles and mumps. Adults born in 1957 or later should have 1 or more doses of MMR vaccine unless there is a contraindication to the vaccine or there is laboratory evidence of immunity to each of the three diseases. A routine second dose of MMR vaccine should be obtained at least 28 days after the first dose for students attending postsecondary schools, health care workers, or international travelers. People who received inactivated measles vaccine or an unknown type of measles vaccine during 1963 1967 should receive 2 doses of MMR vaccine. People who received inactivated mumps vaccine or an unknown type of mumps vaccine before 1979 and are at high risk for mumps infection should consider immunization with 2 doses of   MMR vaccine. For females of childbearing age, rubella immunity should be determined. If there is no evidence of immunity, females who are not pregnant should be vaccinated. If there is no evidence of immunity, females who are pregnant should delay immunization until after pregnancy.  Unvaccinated health care workers born before [birth year] who lack laboratory evidence of measles, mumps, or rubella immunity or laboratory confirmation of disease should consider measles and mumps immunization with 2 doses of MMR vaccine or rubella immunization with 1 dose of MMR vaccine.  Pneumococcal 13-valent conjugate (PCV13) vaccine. When indicated, a person who is uncertain of her immunization history and has no record of immunization should receive the PCV13 vaccine. An adult aged 54 years or older who has certain medical conditions and has not been previously immunized should receive 1 dose of PCV13 vaccine. This PCV13 should be followed with a dose of pneumococcal polysaccharide (PPSV23) vaccine. The PPSV23 vaccine dose should be obtained at least 8 weeks after the dose of PCV13 vaccine. An adult aged 58 years or older who has certain medical conditions and previously received 1 or more doses of PPSV23 vaccine should receive 1 dose of PCV13. The PCV13 vaccine dose should be obtained 1 or more years after the last PPSV23 vaccine dose.  Pneumococcal polysaccharide (PPSV23) vaccine. When PCV13 is also indicated, PCV13 should be obtained first. All adults aged 58 years and older should be immunized. An adult younger than age 65 years who has certain medical conditions should be immunized. Any person who resides in a nursing home or long-term care facility should be immunized. An adult smoker should be immunized. People with an immunocompromised condition and certain other conditions should receive both PCV13 and PPSV23 vaccines. People with human immunodeficiency virus (HIV) infection should be immunized as soon as possible after diagnosis. Immunization during chemotherapy or radiation therapy should be avoided. Routine use of PPSV23 vaccine is not recommended for American Indians, [place] Natives, or people younger than 65 years unless there are medical conditions that require PPSV23 vaccine. When indicated,  people who have unknown immunization and have no record of immunization should receive PPSV23 vaccine. One-time revaccination 5 years after the first dose of PPSV23 is recommended for people aged 70 64 years who have chronic kidney failure, nephrotic syndrome, asplenia, or immunocompromised conditions. People who received 1 2 doses of PPSV23 before age 32 years should receive another dose of PPSV23 vaccine at age 96 years or later if at least 5 years have passed since the previous dose. Doses of PPSV23 are not needed for people immunized with PPSV23 at or after age 55 years.  Meningococcal vaccine. Adults with asplenia or persistent complement component deficiencies should receive 2 doses of quadrivalent meningococcal conjugate (MenACWY-D) vaccine. The doses should be obtained at least 2 months apart. Microbiologists working with certain meningococcal bacteria, [employer] recruits, people at risk during an outbreak, and people who travel to or live in countries with a high rate of meningitis should be immunized. A first-year college student up through age 58 years who is living in a residence hall should receive a dose if she did not receive a dose on or after her 16th birthday. Adults who have certain high-risk conditions should receive one or more doses of vaccine.  Hepatitis A vaccine. Adults who wish to be protected from this disease, have certain high-risk conditions, work with hepatitis A-infected animals, work in hepatitis A research labs, or travel to or work in countries with a high rate of hepatitis A should be  immunized. Adults who were previously unvaccinated and who anticipate close contact with an international adoptee during the first 60 days after arrival in the [place] from a country with a high rate of hepatitis A should be immunized.  Hepatitis B vaccine.  Adults who wish to be protected from this disease, have certain high-risk conditions, may be exposed to blood or other infectious  body fluids, are household contacts or sex partners of hepatitis B positive people, are clients or workers in certain care facilities, or travel to or work in countries with a high rate of hepatitis B should be immunized.  Haemophilus influenzae type b (Hib) vaccine. A previously unvaccinated person with asplenia or sickle cell disease or having a scheduled splenectomy should receive 1 dose of Hib vaccine. Regardless of previous immunization, a recipient of a hematopoietic stem cell transplant should receive a 3-dose series 6 12 months after her successful transplant. Hib vaccine is not recommended for adults with HIV infection.  Preventive Services / Frequency Ages 6 to 39years  Blood pressure check.** / Every 1 to 2 years.  Lipid and cholesterol check.** / Every 5 years beginning at age 39.  Clinical breast exam.** / Every 3 years for women in their 61s and 62s.  BRCA-related cancer risk assessment.** / For women who have family members with a BRCA-related cancer (breast, ovarian, tubal, or peritoneal cancers).  Pap test.** / Every 2 years from ages 47 through 85. Every 3 years starting at age 34 through age 12 or 74 with a history of 3 consecutive normal Pap tests.  HPV screening.** / Every 3 years from ages 46 through ages 43 to 54 with a history of 3 consecutive normal Pap tests.  Hepatitis C blood test.** / For any individual with known risks for hepatitis C.  Skin self-exam. / Monthly.  Influenza vaccine. / Every year.  Tetanus, diphtheria, and acellular pertussis (Tdap, Td) vaccine.** / Consult your health care provider. Pregnant women should receive 1 dose of Tdap vaccine during each pregnancy. 1 dose of Td every 10 years.  Varicella vaccine.** / Consult your health care provider. Pregnant females who do not have evidence of immunity should receive the first dose after pregnancy.  HPV vaccine. / 3 doses over 6 months, if 64 and younger. The vaccine is not recommended for use in  pregnant females. However, pregnancy testing is not needed before receiving a dose.  Measles, mumps, rubella (MMR) vaccine.** / You need at least 1 dose of MMR if you were born in 1957 or later. You may also need a 2nd dose. For females of childbearing age, rubella immunity should be determined. If there is no evidence of immunity, females who are not pregnant should be vaccinated. If there is no evidence of immunity, females who are pregnant should delay immunization until after pregnancy.  Pneumococcal 13-valent conjugate (PCV13) vaccine.** / Consult your health care provider.  Pneumococcal polysaccharide (PPSV23) vaccine.** / 1 to 2 doses if you smoke cigarettes or if you have certain conditions.  Meningococcal vaccine.** / 1 dose if you are age 71 to 36 years and a Market researcher living in a residence hall, or have one of several medical conditions, you need to get vaccinated against meningococcal disease. You may also need additional booster doses.  Hepatitis A vaccine.** / Consult your health care provider.  Hepatitis B vaccine.** / Consult your health care provider.  Haemophilus influenzae type b (Hib) vaccine.** / Consult your health care provider.  Ages 55 to 64years  Blood pressure check.** / Every 1 to 2 years.  Lipid and cholesterol check.** / Every 5 years beginning at age 20 years.  Lung cancer screening. / Every year if you are aged 55 80 years and have a 30-pack-year history of smoking and currently smoke or have quit within the past 15 years. Yearly screening is stopped once you have quit smoking for at least 15 years or develop a health problem that would prevent you from having lung cancer treatment.  Clinical breast exam.** / Every year after age 40 years.  BRCA-related cancer risk assessment.** / For women who have family members with a BRCA-related cancer (breast, ovarian, tubal, or peritoneal cancers).  Mammogram.** / Every year beginning at age 40  years and continuing for as long as you are in good health. Consult with your health care provider.  Pap test.** / Every 3 years starting at age 30 years through age 65 or 70 years with a history of 3 consecutive normal Pap tests.  HPV screening.** / Every 3 years from ages 30 years through ages 65 to 70 years with a history of 3 consecutive normal Pap tests.  Fecal occult blood test (FOBT) of stool. / Every year beginning at age 50 years and continuing until age 75 years. You may not need to do this test if you get a colonoscopy every 10 years.  Flexible sigmoidoscopy or colonoscopy.** / Every 5 years for a flexible sigmoidoscopy or every 10 years for a colonoscopy beginning at age 50 years and continuing until age 75 years.  Hepatitis C blood test.** / For all people born from 1945 through 1965 and any individual with known risks for hepatitis C.  Skin self-exam. / Monthly.  Influenza vaccine. / Every year.  Tetanus, diphtheria, and acellular pertussis (Tdap/Td) vaccine.** / Consult your health care provider. Pregnant women should receive 1 dose of Tdap vaccine during each pregnancy. 1 dose of Td every 10 years.  Varicella vaccine.** / Consult your health care provider. Pregnant females who do not have evidence of immunity should receive the first dose after pregnancy.  Zoster vaccine.** / 1 dose for adults aged 60 years or older.  Measles, mumps, rubella (MMR) vaccine.** / You need at least 1 dose of MMR if you were born in 1957 or later. You may also need a 2nd dose. For females of childbearing age, rubella immunity should be determined. If there is no evidence of immunity, females who are not pregnant should be vaccinated. If there is no evidence of immunity, females who are pregnant should delay immunization until after pregnancy.  Pneumococcal 13-valent conjugate (PCV13) vaccine.** / Consult your health care provider.  Pneumococcal polysaccharide (PPSV23) vaccine.** / 1 to 2 doses if  you smoke cigarettes or if you have certain conditions.  Meningococcal vaccine.** / Consult your health care provider.  Hepatitis A vaccine.** / Consult your health care provider.  Hepatitis B vaccine.** / Consult your health care provider.  Haemophilus influenzae type b (Hib) vaccine.** / Consult your health care provider.  Ages 65 years and over  Blood pressure check.** / Every 1 to 2 years.  Lipid and cholesterol check.** / Every 5 years beginning at age 20 years.  Lung cancer screening. / Every year if you are aged 55 80 years and have a 30-pack-year history of smoking and currently smoke or have quit within the past 15 years. Yearly screening is stopped once you have quit smoking for at least 15 years or develop a health problem that   would prevent you from having lung cancer treatment.  Clinical breast exam.** / Every year after age 103 years.  BRCA-related cancer risk assessment.** / For women who have family members with a BRCA-related cancer (breast, ovarian, tubal, or peritoneal cancers).  Mammogram.** / Every year beginning at age 36 years and continuing for as long as you are in good health. Consult with your health care provider.  Pap test.** / Every 3 years starting at age 5 years through age 85 or 10 years with 3 consecutive normal Pap tests. Testing can be stopped between 65 and 70 years with 3 consecutive normal Pap tests and no abnormal Pap or HPV tests in the past 10 years.  HPV screening.** / Every 3 years from ages 93 years through ages 70 or 45 years with a history of 3 consecutive normal Pap tests. Testing can be stopped between 65 and 70 years with 3 consecutive normal Pap tests and no abnormal Pap or HPV tests in the past 10 years.  Fecal occult blood test (FOBT) of stool. / Every year beginning at age 8 years and continuing until age 45 years. You may not need to do this test if you get a colonoscopy every 10 years.  Flexible sigmoidoscopy or colonoscopy.** /  Every 5 years for a flexible sigmoidoscopy or every 10 years for a colonoscopy beginning at age 69 years and continuing until age 68 years.  Hepatitis C blood test.** / For all people born from [birth year] through 1966 and any individual with known risks for hepatitis C.  Osteoporosis screening.** / A one-time screening for women ages 7 years and over and women at risk for fractures or osteoporosis.  Skin self-exam. / Monthly.  Influenza vaccine. / Every year.  Tetanus, diphtheria, and acellular pertussis (Tdap/Td) vaccine.** / 1 dose of Td every 10 years.  Varicella vaccine.** / Consult your health care provider.  Zoster vaccine.** / 1 dose for adults aged 5 years or older.  Pneumococcal 13-valent conjugate (PCV13) vaccine.** / Consult your health care provider.  Pneumococcal polysaccharide (PPSV23) vaccine.** / 1 dose for all adults aged 74 years and older.  Meningococcal vaccine.** / Consult your health care provider.  Hepatitis A vaccine.** / Consult your health care provider.  Hepatitis B vaccine.** / Consult your health care provider.  Haemophilus influenzae type b (Hib) vaccine.** / Consult your health care provider. ** Family history and personal history of risk and conditions may change your health care provider's recommendations. Document Released: 01/16/2002 Document Revised: 09/10/2013  [facility] Patient Information 2014 [place], [place].   EXERCISE AND DIET:  We recommended that you start or continue a regular exercise program for good health. Regular exercise means any activity that makes your heart beat faster and makes you sweat.  We recommend exercising at least 30 minutes per day at least 3 days a week, preferably 5.  We also recommend a diet low in fat and sugar / carbohydrates.  Inactivity, poor dietary choices and obesity can cause diabetes, heart attack, stroke, and kidney damage, among others.     ALCOHOL AND SMOKING:  Women should limit their alcohol intake to no  more than 7 drinks/beers/glasses of wine (combined, not each!) per week. Moderation of alcohol intake to this level decreases your risk of breast cancer and liver damage.  ( And of course, no recreational drugs are part of a healthy lifestyle.)  Also, you should not be smoking at all or even being exposed to second hand smoke. Most people know smoking can  cause cancer, and various heart and lung diseases, but did you know it also contributes to weakening of your bones?  Aging of your skin?  Yellowing of your teeth and nails?   CALCIUM AND VITAMIN D:  Adequate intake of calcium and Vitamin D are recommended.  The recommendations for exact amounts of these supplements seem to change often, but generally speaking 600 mg of calcium (either carbonate or citrate) and 800 units of Vitamin D per day seems prudent. Certain women may benefit from higher intake of Vitamin D.  If you are among these women, your doctor will have told you during your visit.     PAP SMEARS:  Pap smears, to check for cervical cancer or precancers,  have traditionally been done yearly, although recent scientific advances have shown that most women can have pap smears less often.  However, every woman still should have a physical exam from her gynecologist or primary care physician every year. It will include a breast check, inspection of the vulva and vagina to check for abnormal growths or skin changes, a visual exam of the cervix, and then an exam to evaluate the size and shape of the uterus and ovaries.  And after 63 years of age, a rectal exam is indicated to check for rectal cancers. We will also provide age appropriate advice regarding health maintenance, like when you should have certain vaccines, screening for sexually transmitted diseases, bone density testing, colonoscopy, mammograms, etc.    MAMMOGRAMS:  All women over 50 years old should have a yearly mammogram. Many facilities now offer a "3D" mammogram, which may cost  around $50 extra out of pocket. If possible,  we recommend you accept the option to have the 3D mammogram performed.  It both reduces the number of women who will be called back for extra views which then turn out to be normal, and it is better than the routine mammogram at detecting truly abnormal areas.     COLONOSCOPY:  Colonoscopy to screen for colon cancer is recommended for all women at age 70.  We know, you hate the idea of the prep.  We agree, BUT, having colon cancer and not knowing it is worse!!  Colon cancer so often starts as a polyp that can be seen and removed at colonscopy, which can quite literally save your life!  And if your first colonoscopy is normal and you have no family history of colon cancer, most women don't have to have it again for 10 years.  Once every ten years, you can do something that may end up saving your life, right?  We will be happy to help you get it scheduled when you are ready.  Be sure to check your insurance coverage so you understand how much it will cost.  It may be covered as a preventative service at no cost, but you should check your particular policy.    Please continue all medications as directed. Please continue with regular INR checks and sending information to clinic. Please schedule fasting lab appt this Friday. Regular follow-up 6 months, sooner if needed. NICE YO SEE YOU!

## 2018-03-27 ENCOUNTER — Other Ambulatory Visit: Payer: Self-pay | Admitting: Adult Health

## 2018-03-29 ENCOUNTER — Other Ambulatory Visit: Payer: Medicare Other

## 2018-04-01 ENCOUNTER — Other Ambulatory Visit (INDEPENDENT_AMBULATORY_CARE_PROVIDER_SITE_OTHER): Payer: Medicare Other

## 2018-04-01 DIAGNOSIS — E039 Hypothyroidism, unspecified: Secondary | ICD-10-CM

## 2018-04-01 DIAGNOSIS — R739 Hyperglycemia, unspecified: Secondary | ICD-10-CM

## 2018-04-01 DIAGNOSIS — Z0000 Encounter for general adult medical examination without abnormal findings: Secondary | ICD-10-CM

## 2018-04-01 DIAGNOSIS — E559 Vitamin D deficiency, unspecified: Secondary | ICD-10-CM | POA: Diagnosis not present

## 2018-04-01 DIAGNOSIS — E785 Hyperlipidemia, unspecified: Secondary | ICD-10-CM

## 2018-04-02 LAB — CBC WITH DIFFERENTIAL/PLATELET
Basophils Absolute: 0 10*3/uL (ref 0.0–0.2)
Basos: 0 %
EOS (ABSOLUTE): 0 10*3/uL (ref 0.0–0.4)
Eos: 1 %
Hematocrit: 38.9 % (ref 34.0–46.6)
Hemoglobin: 12.8 g/dL (ref 11.1–15.9)
IMMATURE GRANS (ABS): 0 10*3/uL (ref 0.0–0.1)
Immature Granulocytes: 0 %
LYMPHS: 31 %
Lymphocytes Absolute: 2 10*3/uL (ref 0.7–3.1)
MCH: 31.8 pg (ref 26.6–33.0)
MCHC: 32.9 g/dL (ref 31.5–35.7)
MCV: 97 fL (ref 79–97)
Monocytes Absolute: 0.5 10*3/uL (ref 0.1–0.9)
Monocytes: 8 %
NEUTROS PCT: 60 %
Neutrophils Absolute: 4 10*3/uL (ref 1.4–7.0)
PLATELETS: 202 10*3/uL (ref 150–379)
RBC: 4.03 x10E6/uL (ref 3.77–5.28)
RDW: 13.8 % (ref 12.3–15.4)
WBC: 6.6 10*3/uL (ref 3.4–10.8)

## 2018-04-02 LAB — COMPREHENSIVE METABOLIC PANEL
A/G RATIO: 1.2 (ref 1.2–2.2)
ALT: 24 IU/L (ref 0–32)
AST: 26 IU/L (ref 0–40)
Albumin: 3.6 g/dL (ref 3.6–4.8)
Alkaline Phosphatase: 100 IU/L (ref 39–117)
BUN/Creatinine Ratio: 23 (ref 12–28)
BUN: 10 mg/dL (ref 8–27)
Bilirubin Total: 0.3 mg/dL (ref 0.0–1.2)
CALCIUM: 8.9 mg/dL (ref 8.7–10.3)
CO2: 26 mmol/L (ref 20–29)
Chloride: 100 mmol/L (ref 96–106)
Creatinine, Ser: 0.43 mg/dL — ABNORMAL LOW (ref 0.57–1.00)
GFR calc Af Amer: 125 mL/min/{1.73_m2} (ref >59)
GFR, EST NON AFRICAN AMERICAN: 109 mL/min/{1.73_m2} (ref >59)
GLUCOSE: 87 mg/dL (ref 65–99)
Globulin, Total: 3 g/dL (ref 1.5–4.5)
POTASSIUM: 3.2 mmol/L — AB (ref 3.5–5.2)
Sodium: 143 mmol/L (ref 134–144)
Total Protein: 6.6 g/dL (ref 6.0–8.5)

## 2018-04-02 LAB — LIPID PANEL
Chol/HDL Ratio: 3.9 ratio (ref 0.0–4.4)
Cholesterol, Total: 183 mg/dL (ref 100–199)
HDL: 47 mg/dL (ref >39)
LDL Calculated: 113 mg/dL — ABNORMAL HIGH (ref 0–99)
Triglycerides: 117 mg/dL (ref 0–149)
VLDL CHOLESTEROL CAL: 23 mg/dL (ref 5–40)

## 2018-04-02 LAB — HEMOGLOBIN A1C
ESTIMATED AVERAGE GLUCOSE: 100 mg/dL
HEMOGLOBIN A1C: 5.1 % (ref 4.8–5.6)

## 2018-04-02 LAB — TSH: TSH: 2.61 u[IU]/mL (ref 0.450–4.500)

## 2018-04-02 LAB — VITAMIN D 25 HYDROXY (VIT D DEFICIENCY, FRACTURES): VIT D 25 HYDROXY: 59.2 ng/mL (ref 30.0–100.0)

## 2018-04-08 ENCOUNTER — Telehealth: Payer: Self-pay

## 2018-04-08 ENCOUNTER — Encounter: Payer: Self-pay | Admitting: Adult Health

## 2018-04-08 DIAGNOSIS — Z7901 Long term (current) use of anticoagulants: Secondary | ICD-10-CM | POA: Diagnosis not present

## 2018-04-08 LAB — PROTIME-INR: INR: 2 — AB (ref 0.9–1.1)

## 2018-04-08 NOTE — Telephone Encounter (Signed)
Received fax from [facility] re: INR results.  INR was 2.0.  Per Dr. Raliegh Scarlet, advised pt to continue current dose of Coumadin.  Pt is also requesting to increase her dose of K+ d/t to her recent low levels and the fact that she c/o fatigue and weakness.  Please advise.  [name], CMA

## 2018-04-15 ENCOUNTER — Other Ambulatory Visit: Payer: Self-pay | Admitting: Adult Health

## 2018-04-15 ENCOUNTER — Other Ambulatory Visit: Payer: Self-pay

## 2018-04-15 DIAGNOSIS — E876 Hypokalemia: Secondary | ICD-10-CM

## 2018-04-15 MED ORDER — POTASSIUM CHLORIDE CRYS ER 15 MEQ PO TBCR: 15.0000 meq | EXTENDED_RELEASE_TABLET | Freq: Two times a day (BID) | ORAL | 0 refills | Status: DC

## 2018-04-15 NOTE — Progress Notes (Signed)
cmp

## 2018-04-15 NOTE — Telephone Encounter (Signed)
Pt informed.  Pt expressed understanding and is agreeable.  T. Nelson, CMA  

## 2018-04-15 NOTE — Telephone Encounter (Signed)
Good Morning [name], Glad to see that her INR is now WML I increased her K+ from 20 mEq daily to 15 mEq BID- so now a total of 30 mEq day. Please have her schedule a lab appt in 30 days so we can check her CMP. Can you please put this future order in. Thanks! [name]

## 2018-04-16 ENCOUNTER — Telehealth: Payer: Self-pay | Admitting: Adult Health

## 2018-04-16 NOTE — Telephone Encounter (Signed)
Rcvd call from Scarlette @ [facility] / [phone] states --- they can not get this Rx (unavailable):  potassium chloride SA (KLOR-CON M15) 15 MEQ tablet [[id number]]   Order Details  Dose: 15 mEq Route: Oral Frequency: 2 times daily  Indications of Use: Hypokalemia  Dispense Quantity: 120 tablet Refills: 0 Fills remaining: --        Sig: Take 1 tablet (15 mEq total) by mouth 2 (two) times daily.     Please call them back to advise if alternative or what to do @ :   Preferred Pharmacies      [place], [place] [phone] (Phone) [phone] (Fax)   ---forwarding message to medical assistant .  --Dion Body

## 2018-04-16 NOTE — Telephone Encounter (Signed)
Please advise.  T. Yobani Schertzer, CMA 

## 2018-04-17 ENCOUNTER — Other Ambulatory Visit: Payer: Self-pay | Admitting: Adult Health

## 2018-04-17 MED ORDER — POTASSIUM CHLORIDE CRYS ER 10 MEQ PO TBCR
10.0000 meq | EXTENDED_RELEASE_TABLET | Freq: Three times a day (TID) | ORAL | 2 refills | Status: DC
Start: 2018-04-17 — End: 2018-08-26

## 2018-04-17 NOTE — Telephone Encounter (Signed)
Good Morning [name], Can you please call Ms. Zufall and share: Since pharmacy unable to fill K+Chol 63mEq BID- therefore, d/c'd med Sent in K+Chol 55mEq TID, the equal same daily dose Thanks! [name]

## 2018-04-17 NOTE — Progress Notes (Signed)
Pharmacy unable to fill K+Chol 54mEq BID- therefore, d/c'd med Sent in K+Chol 10mEq TID, the equal same daily dose

## 2018-04-17 NOTE — Telephone Encounter (Signed)
Pt informed.  T. Nelson, CMA 

## 2018-04-22 LAB — POCT INR: INR: 2.6 — AB (ref 0.9–1.1)

## 2018-04-23 DIAGNOSIS — Z7901 Long term (current) use of anticoagulants: Secondary | ICD-10-CM | POA: Diagnosis not present

## 2018-04-23 NOTE — Progress Notes (Signed)
Received fax from [facility] INR report for 04/22/18 at 2.6.  Per [name], NP pt to continue current dose of warfarin and recheck in 2 weeks.  [name], CMA

## 2018-04-30 ENCOUNTER — Other Ambulatory Visit: Payer: Self-pay | Admitting: Adult Health

## 2018-05-06 LAB — POCT INR: INR: 2.1 — AB (ref 0.9–1.1)

## 2018-05-09 DIAGNOSIS — Z7901 Long term (current) use of anticoagulants: Secondary | ICD-10-CM | POA: Diagnosis not present

## 2018-05-09 NOTE — Progress Notes (Signed)
INR report received from [facility].  INR is 2.1.  Per [name], NP pt informed to continue current dose of Coumadin.  Pt expressed understanding and is agreeable.  [name], CMA

## 2018-05-17 ENCOUNTER — Telehealth: Payer: Self-pay | Admitting: Adult Health

## 2018-05-17 NOTE — Telephone Encounter (Signed)
Received call from [facility] rep/ Debbi @[phone] request provider or nurse call her to approve continued treatment.  --forwarding message to medical assistant.  --Dion Body

## 2018-05-20 ENCOUNTER — Other Ambulatory Visit: Payer: Medicare Other

## 2018-05-20 ENCOUNTER — Telehealth: Payer: Self-pay | Admitting: Adult Health

## 2018-05-20 ENCOUNTER — Encounter: Payer: Self-pay | Admitting: Adult Health

## 2018-05-20 NOTE — Telephone Encounter (Signed)
Noted MPulliam, CMA/RT(R)  

## 2018-05-20 NOTE — Telephone Encounter (Signed)
Angela and [name], NP aware. MPulliam, CMA/RT(R)

## 2018-05-20 NOTE — Telephone Encounter (Signed)
Pt request INR check--consulted with provider states it would be a send out--PCFO does not have tools for immediate check--  --Called patient back explain we do not have the tools to perform in office.  --forwarding message as FYI to medical assistant.  --[name]

## 2018-05-20 NOTE — Telephone Encounter (Signed)
Called patient back to confirm she is "Not" actively still bleeding--- Patient states home health nurse was able to stop the bleeding --- Pt states INR show 2.1 thickness and she is unsure if her machine is acting up but know blood flow from paper cut should not have been bleeding as freely  & that there is Normally no bleed around her catheter either, she just wanted her INR check 6/18 when she comes in for Lab appt.   --forwarding message to medial assistant/ Provider.  ----Dion Body

## 2018-05-21 ENCOUNTER — Other Ambulatory Visit (INDEPENDENT_AMBULATORY_CARE_PROVIDER_SITE_OTHER): Payer: Medicare Other

## 2018-05-21 DIAGNOSIS — E876 Hypokalemia: Secondary | ICD-10-CM

## 2018-05-21 DIAGNOSIS — Z7901 Long term (current) use of anticoagulants: Secondary | ICD-10-CM

## 2018-05-21 NOTE — Addendum Note (Signed)
Addended by: Lanier Prude D on: 05/21/2018 10:32 AM   Modules accepted: Level of Service

## 2018-05-22 LAB — COMPREHENSIVE METABOLIC PANEL
ALBUMIN: 3.8 g/dL (ref 3.6–4.8)
ALT: 19 IU/L (ref 0–32)
AST: 24 IU/L (ref 0–40)
Albumin/Globulin Ratio: 1.1 — ABNORMAL LOW (ref 1.2–2.2)
Alkaline Phosphatase: 101 IU/L (ref 39–117)
BUN / CREAT RATIO: 26 (ref 12–28)
BUN: 13 mg/dL (ref 8–27)
Bilirubin Total: 0.3 mg/dL (ref 0.0–1.2)
CALCIUM: 9.5 mg/dL (ref 8.7–10.3)
CO2: 24 mmol/L (ref 20–29)
CREATININE: 0.5 mg/dL — AB (ref 0.57–1.00)
Chloride: 97 mmol/L (ref 96–106)
GFR, EST AFRICAN AMERICAN: 119 mL/min/{1.73_m2} (ref >59)
GFR, EST NON AFRICAN AMERICAN: 103 mL/min/{1.73_m2} (ref >59)
GLUCOSE: 98 mg/dL (ref 65–99)
Globulin, Total: 3.4 g/dL (ref 1.5–4.5)
Potassium: 4.1 mmol/L (ref 3.5–5.2)
Sodium: 139 mmol/L (ref 134–144)
Total Protein: 7.2 g/dL (ref 6.0–8.5)

## 2018-05-22 LAB — PROTIME-INR
INR: 2 — ABNORMAL HIGH (ref 0.8–1.2)
Prothrombin Time: 20.2 s — ABNORMAL HIGH (ref 9.1–12.0)

## 2018-05-22 NOTE — Progress Notes (Signed)
Thank you Whitney for the patient information!

## 2018-05-27 ENCOUNTER — Other Ambulatory Visit: Payer: Medicare Other

## 2018-05-27 ENCOUNTER — Other Ambulatory Visit: Payer: Self-pay | Admitting: Adult Health

## 2018-05-27 NOTE — Telephone Encounter (Signed)
Verbal authorization given to Debi to continue treatment for catheter changes.  [name], CMA

## 2018-06-04 LAB — POCT INR: INR: 2 — AB (ref 0.9–1.1)

## 2018-06-11 ENCOUNTER — Telehealth: Payer: Self-pay

## 2018-06-11 NOTE — Telephone Encounter (Signed)
Received fax for pt's INR dated 06/04/18.  Advised pt to continue current dose of Coumadin per [facility], NP-C.  Pt expressed understanding and is agreeable.  [name], CMA

## 2018-06-20 LAB — POCT INR: INR: 2.1 (ref 2.0–3.0)

## 2018-06-21 ENCOUNTER — Telehealth: Payer: Self-pay

## 2018-06-21 DIAGNOSIS — Z7901 Long term (current) use of anticoagulants: Secondary | ICD-10-CM | POA: Diagnosis not present

## 2018-06-21 NOTE — Telephone Encounter (Signed)
Received fax from [employer] [facility].  Pt's INR on 06/20/18 was 2.0.  Per [name], NP, advised pt to continue current dose of medication.  Pt expressed understanding and is agreeable.  [name], CMA

## 2018-06-25 ENCOUNTER — Other Ambulatory Visit: Payer: Self-pay | Admitting: Adult Health

## 2018-07-08 LAB — POCT INR: INR: 1.9 — AB (ref 0.9–1.1)

## 2018-07-09 NOTE — Progress Notes (Signed)
Per [name], NP advised pt to continue current dose of Coumadin, repeat in 2 weeks and if INR is less than 2.0 at that time, [name] will make adjustments to Coumadin dosing.  Pt expressed understanding and is agreeable.  [name], CMA

## 2018-07-19 ENCOUNTER — Telehealth: Payer: Self-pay | Admitting: Adult Health

## 2018-07-19 NOTE — Telephone Encounter (Signed)
Mark @ [employer] called to speak w/ [name] regarding [name]'s care / data & service follow up-- forwarding message to provider.  Mark @ [phone].  --glh

## 2018-07-22 NOTE — Telephone Encounter (Signed)
Mark @ [facility] wanted to f/u to ensure that we are receiving reports for pt's INRs.  [name] that we are receiving INR reports from them.  [name], CMA

## 2018-07-23 LAB — PROTIME-INR: INR: 1.9 — AB (ref 0.9–1.1)

## 2018-07-25 ENCOUNTER — Other Ambulatory Visit: Payer: Self-pay | Admitting: Adult Health

## 2018-07-25 ENCOUNTER — Telehealth: Payer: Self-pay

## 2018-07-25 ENCOUNTER — Telehealth: Payer: Self-pay | Admitting: Adult Health

## 2018-07-25 MED ORDER — WARFARIN SODIUM 2.5 MG PO TABS: ORAL_TABLET | ORAL | 0 refills | Status: DC

## 2018-07-25 NOTE — Telephone Encounter (Signed)
-----   Message from Esaw Grandchild, NP sent at 07/25/2018 10:45 AM EDT ----- Regarding: Coumadin Adjustment [name], Thank you for verifying [name]'s current coumadin therapy- 4mg  M/W/F 2mg  T/R/S/S  With two INRs of 1.9, will make the following adjustment- Continue 4mg  M/W/F Slight increase to 2.5mg  T/R/S/S  Rx sent to local pharmacy so she can get rx immediately, Please tell her to continue to check INR every 2 weeks. Please tell her to call with any questions/concerns. Thanks! [name]

## 2018-07-25 NOTE — Telephone Encounter (Signed)
Discussed with pt.  [name], CMA

## 2018-07-25 NOTE — Telephone Encounter (Signed)
Pt informed.  Pt expressed understanding and is agreeable.  T. Nelson, CMA  

## 2018-07-25 NOTE — Telephone Encounter (Signed)
Patient called states a reduced Rx for:   warfarin (COUMADIN) 2.5 MG tablet [[id number]]   Order Details  Dose, Route, Frequency: As Directed   Dispense Quantity: 30 tablet Refills: 0 Fills remaining: --        Sig: 2.5mg  tablet once daily on tues/thurs/sat/sun          Rx sent to:  Pharmacy:  [place], [place]    --forwarding message to medical assistant to contact patient with information.  --glh

## 2018-07-25 NOTE — Progress Notes (Signed)
Ms. Jaquith current coumadin therapy- 4mg  M/W/F 2mg  T/R/S/S  With two INRs of 1.9, will make the following adjustment- Continue 4mg  M/W/F Slight increase to 2.5mg  T/R/S/S  Rx sent to local pharmacy so she can get rx immediately, Please tell her to continue to check INR every 2 weeks. Please tell her to call with any questions/concerns. Thanks! [name]

## 2018-07-31 ENCOUNTER — Telehealth: Payer: Self-pay | Admitting: Adult Health

## 2018-07-31 NOTE — Telephone Encounter (Signed)
Pt called states her wheelchair has broken & she needs a replacement, but her [employer] ) says doctor must see patient and deem unmobile without it before they will consider getting her a new one.   --forwarding message to medical assistant for review w/ provider to get patient a New wheelchair.  --glh

## 2018-07-31 NOTE — Telephone Encounter (Signed)
Does the patient really need to come in for this issue?  Pt has been a quadraplegic for many years.  Please advise.  [name], CMA

## 2018-07-31 NOTE — Telephone Encounter (Signed)
No ma'am We will just send in an order for replacement chair. Thanks! [name]

## 2018-08-01 NOTE — Telephone Encounter (Signed)
Spoke with [employer] who states that pt's insurance requires a face to face evaluation for need of wheelchair, as well as a PT referral for evaluation for wheelchair specific needs.  [name], CMA

## 2018-08-06 ENCOUNTER — Telehealth: Payer: Self-pay

## 2018-08-06 ENCOUNTER — Other Ambulatory Visit: Payer: Self-pay | Admitting: Adult Health

## 2018-08-06 ENCOUNTER — Encounter: Payer: Self-pay | Admitting: Adult Health

## 2018-08-06 LAB — PROTIME-INR

## 2018-08-06 MED ORDER — WARFARIN SODIUM 2 MG PO TABS: ORAL_TABLET | ORAL | 1 refills | Status: DC

## 2018-08-06 NOTE — Telephone Encounter (Signed)
Per [name], NP advised pt to skip her dose of Coumadin today given her elevated INR.  Pt expressed understanding and is agreeable.  [name], CMA

## 2018-08-06 NOTE — Telephone Encounter (Signed)
Pt informed.  Pt repeated Coumadin dosage.  [name], CMA

## 2018-08-06 NOTE — Telephone Encounter (Signed)
Pt called stating that her INR today is 3.5.  She states that she is having some small bruises "here and there".  Denies any bleeding or bleeding gums when she brushes her teeth.  She states that she is taking warafin 2.5mg  QD except 4mg  MWF.  Please advise.  [name], CMA

## 2018-08-06 NOTE — Telephone Encounter (Signed)
Good Afternoon [name], Can you please tell Ms. Mccarter and tell her to resume her previous coumadin dosing schedule- MWF- 4mg  Tues/Thrusday/Sat/Sun- 2mg  only not the 2.5 Please have her read back and verify dosage plan. Please have her call us with INR results in 2 weeks Please have her call us if she experiences any increased bruising or bleeding. Thanks! [name]

## 2018-08-12 ENCOUNTER — Encounter: Payer: Self-pay | Admitting: Adult Health

## 2018-08-12 ENCOUNTER — Ambulatory Visit (INDEPENDENT_AMBULATORY_CARE_PROVIDER_SITE_OTHER): Payer: Medicare Other | Admitting: Adult Health

## 2018-08-12 VITALS — BP 98/70 | HR 64

## 2018-08-12 DIAGNOSIS — Z7901 Long term (current) use of anticoagulants: Secondary | ICD-10-CM

## 2018-08-12 DIAGNOSIS — Z0000 Encounter for general adult medical examination without abnormal findings: Secondary | ICD-10-CM

## 2018-08-12 DIAGNOSIS — R292 Abnormal reflex: Secondary | ICD-10-CM

## 2018-08-12 DIAGNOSIS — G894 Chronic pain syndrome: Secondary | ICD-10-CM

## 2018-08-12 DIAGNOSIS — G839 Paralytic syndrome, unspecified: Secondary | ICD-10-CM

## 2018-08-12 DIAGNOSIS — G8220 Paraplegia, unspecified: Secondary | ICD-10-CM | POA: Insufficient documentation

## 2018-08-12 DIAGNOSIS — Z5181 Encounter for therapeutic drug level monitoring: Secondary | ICD-10-CM

## 2018-08-12 DIAGNOSIS — G8250 Quadriplegia, unspecified: Secondary | ICD-10-CM

## 2018-08-12 NOTE — Progress Notes (Signed)
Pt given flu vaccine to take home for home health nurse to administer after 09/03/18 per [name], NP.  Pt promises to not receive injection before 09/03/18.  [name], CMA

## 2018-08-12 NOTE — Assessment & Plan Note (Signed)
PT Referral Placed

## 2018-08-12 NOTE — Assessment & Plan Note (Signed)
Recent changes to coumadin therapy due to fluctuating INR results. Denies increased bleeding/bruising. Will call Monday with recent INR result

## 2018-08-12 NOTE — Patient Instructions (Addendum)
Mediterranean Diet A Mediterranean diet refers to food and lifestyle choices that are based on the traditions of countries located on the [place] Sea. This way of eating has been shown to help prevent certain conditions and improve outcomes for people who have chronic diseases, like kidney disease and heart disease. What are tips for following this plan? Lifestyle  Cook and eat meals together with your family, when possible.  Drink enough fluid to keep your urine clear or pale yellow.  Be physically active every day. This includes: ? Aerobic exercise like running or swimming. ? Leisure activities like gardening, walking, or housework.  Get 7-8 hours of sleep each night.  If recommended by your health care provider, drink red wine in moderation. This means 1 glass a day for nonpregnant women and 2 glasses a day for men. A glass of wine equals 5 oz (150 mL). Reading food labels  Check the serving size of packaged foods. For foods such as rice and pasta, the serving size refers to the amount of cooked product, not dry.  Check the total fat in packaged foods. Avoid foods that have saturated fat or trans fats.  Check the ingredients list for added sugars, such as corn syrup. Shopping  At the grocery store, buy most of your food from the areas near the walls of the store. This includes: ? Fresh fruits and vegetables (produce). ? Grains, beans, nuts, and seeds. Some of these may be available in unpackaged forms or large amounts (in bulk). ? Fresh seafood. ? Poultry and eggs. ? Low-fat dairy products.  Buy whole ingredients instead of prepackaged foods.  Buy fresh fruits and vegetables in-season from local farmers markets.  Buy frozen fruits and vegetables in resealable bags.  If you do not have access to quality fresh seafood, buy precooked frozen shrimp or canned fish, such as tuna, salmon, or sardines.  Buy small amounts of raw or cooked vegetables, salads, or olives from the  deli or salad bar at your store.  Stock your pantry so you always have certain foods on hand, such as olive oil, canned tuna, canned tomatoes, rice, pasta, and beans. Cooking  Cook foods with extra-virgin olive oil instead of using butter or other vegetable oils.  Have meat as a side dish, and have vegetables or grains as your main dish. This means having meat in small portions or adding small amounts of meat to foods like pasta or stew.  Use beans or vegetables instead of meat in common dishes like chili or lasagna.  Experiment with different cooking methods. Try roasting or broiling vegetables instead of steaming or sauteing them.  Add frozen vegetables to soups, stews, pasta, or rice.  Add nuts or seeds for added healthy fat at each meal. You can add these to yogurt, salads, or vegetable dishes.  Marinate fish or vegetables using olive oil, lemon juice, garlic, and fresh herbs. Meal planning  Plan to eat 1 vegetarian meal one day each week. Try to work up to 2 vegetarian meals, if possible.  Eat seafood 2 or more times a week.  Have healthy snacks readily available, such as: ? Vegetable sticks with hummus. ? Greek yogurt. ? Fruit and nut trail mix.  Eat balanced meals throughout the week. This includes: ? Fruit: 2-3 servings a day ? Vegetables: 4-5 servings a day ? Low-fat dairy: 2 servings a day ? Fish, poultry, or lean meat: 1 serving a day ? Beans and legumes: 2 or more servings a week ? Nuts   and seeds: 1-2 servings a day ? Whole grains: 6-8 servings a day ? Extra-virgin olive oil: 3-4 servings a day  Limit red meat and sweets to only a few servings a month What are my food choices?  Mediterranean diet ? Recommended ? Grains: Whole-grain pasta. Brown rice. Bulgar wheat. Polenta. Couscous. Whole-wheat bread. [place]. ? Vegetables: Artichokes. Beets. Broccoli. Cabbage. Carrots. Eggplant. Green beans. Chard. Kale. Spinach. Onions. Leeks. Peas. Squash.  Tomatoes. Peppers. Radishes. ? Fruits: Apples. Apricots. Avocado. Berries. Bananas. Cherries. Dates. Figs. Grapes. Lemons. Melon. Oranges. Peaches. Plums. Pomegranate. ? Meats and other protein foods: Beans. Almonds. Sunflower seeds. Pine nuts. Peanuts. [place]. Salmon. [place]all[place]. Shrimp. [place]. Tilapia. Clams. Oysters. Eggs. ? Dairy: Low-fat milk. Cheese. Greek yogurt. ? Beverages: Water. Red wine. Herbal tea. ? Fats and oils: Extra virgin olive oil. Avocado oil. Grape seed oil. ? Sweets and desserts: [place] yogurt with honey. Baked apples. Poached pears. Trail mix. ? Seasoning and other foods: Basil. Cilantro. Coriander. Cumin. Mint. Parsley. Sage. Rosemary. Tarragon. Garlic. Oregano. Thyme. Pepper. Balsalmic vinegar. Tahini. Hummus. Tomato sauce. Olives. Mushrooms. ? Limit these ? Grains: Prepackaged pasta or rice dishes. Prepackaged cereal with added sugar. ? Vegetables: Deep fried potatoes (french fries). ? Fruits: Fruit canned in syrup. ? Meats and other protein foods: Beef. Pork. Lamb. Poultry with skin. Hot dogs. [place] Sal[place]. ? Dairy: Ice cream. Sour cream. Whole milk. ? Beverages: Juice. Sugar-sweetened soft drinks. Beer. Liquor and spirits. ? Fats and oils: Butter. Canola oil. Vegetable oil. Beef fat (tallow). Lard. ? Sweets and desserts: Cookies. Cakes. Pies. Candy. ? Seasoning and other foods: Mayonnaise. Premade sauces and marinades. ? The items listed may not be a complete list. Talk with your dietitian about what dietary choices are right for you. Summary  The Mediterranean diet includes both food and lifestyle choices.  Eat a variety of fresh fruits and vegetables, beans, nuts, seeds, and whole grains.  Limit the amount of red meat and sweets that you eat.  Talk with your health care provider about whether it is safe for you to drink red wine in moderation. This means 1 glass a day for nonpregnant women and 2 glasses a day for men. A glass of wine equals 5 oz (150 mL). This information  is not intended to replace advice given to you by your health care provider. Make sure you discuss any questions you have with your health care provider. Document Released: 07/13/2016 Document Revised: 08/15/2016 Document Reviewed: 07/13/2016 Elsevier Interactive Patient Education  2018 [place] all medications as directed. Please call [employer] to inquire about what steps are needed to complete ordering of new wheelchair. Follow Mediterranean Diet. Referral to Physical Therapy placed. Please call clinic on Monday with most recent INR. Recommend follow-up in 6 months for physical with fasting labs. Please call with any questions/concerns. NICE TO SEE YOU!

## 2018-08-12 NOTE — Assessment & Plan Note (Addendum)
>>  ASSESSMENT AND PLAN FOR TETRAPLEGIA (HCC) WRITTEN ON 08/12/2018  2:07 PM BY Taleshia Luff, Orpha Bur D, NP  Referral to PT placed, required by insurance in order to secure new wheelchair.  >>ASSESSMENT AND PLAN FOR PARALYSIS WRITTEN ON 02/05/2019  5:09 PM BY Tayvian Holycross, Jinny Blossom, NP  Wheelchair bound
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
## 2018-08-12 NOTE — Assessment & Plan Note (Signed)
  Continue all medications as directed. Please call [employer] to inquire about what steps are needed to complete ordering of new wheelchair. Follow Mediterranean Diet. Referral to Physical Therapy placed. Please call clinic on Monday with most recent INR. Recommend follow-up in 6 months for physical with fasting labs. Please call with any questions/concerns.

## 2018-08-26 ENCOUNTER — Other Ambulatory Visit: Payer: Self-pay

## 2018-08-26 MED ORDER — FLUCONAZOLE 200 MG PO TABS: 200.0000 mg | ORAL_TABLET | Freq: Every day | ORAL | 2 refills | Status: DC | PRN

## 2018-08-26 MED ORDER — BACLOFEN 20 MG PO TABS: ORAL_TABLET | ORAL | 1 refills | Status: DC

## 2018-08-26 MED ORDER — DIAZEPAM 5 MG PO TABS: 5.0000 mg | ORAL_TABLET | Freq: Two times a day (BID) | ORAL | 0 refills | Status: DC | PRN

## 2018-08-26 MED ORDER — BUMETANIDE 1 MG PO TABS: 1.0000 mg | ORAL_TABLET | Freq: Every day | ORAL | 1 refills | Status: DC

## 2018-08-26 MED ORDER — WARFARIN SODIUM 4 MG PO TABS: ORAL_TABLET | ORAL | 1 refills | Status: DC

## 2018-08-26 MED ORDER — POTASSIUM CHLORIDE CRYS ER 10 MEQ PO TBCR: 10.0000 meq | EXTENDED_RELEASE_TABLET | Freq: Three times a day (TID) | ORAL | 2 refills | Status: DC

## 2018-08-26 MED ORDER — OLOPATADINE HCL 0.1 % OP SOLN: OPHTHALMIC | 3 refills | Status: DC

## 2018-08-26 MED ORDER — PRAVASTATIN SODIUM 40 MG PO TABS: 40.0000 mg | ORAL_TABLET | Freq: Every day | ORAL | 1 refills | Status: DC

## 2018-08-26 MED ORDER — OXYBUTYNIN CHLORIDE 5 MG PO TABS: 5.0000 mg | ORAL_TABLET | Freq: Two times a day (BID) | ORAL | 1 refills | Status: DC

## 2018-08-26 MED ORDER — LEVOTHYROXINE SODIUM 50 MCG PO TABS: 50.0000 ug | ORAL_TABLET | Freq: Every day | ORAL | 5 refills | Status: DC

## 2018-09-09 LAB — POCT INR: INR: 2 — AB (ref 0.9–1.1)

## 2018-09-09 NOTE — Progress Notes (Signed)
Pt informed to continue current dose of Coumadin and repeat INR in 2 weeks.  Pt expressed understanding and is agreeable.  [name], CMA

## 2018-09-23 LAB — POCT INR: INR: 3 — AB (ref 0.9–1.1)

## 2018-09-24 NOTE — Progress Notes (Signed)
Pt informed to continue same dosage of coumadin.  Pt expressed understanding and is agreeable.  [name], CMA

## 2018-09-30 ENCOUNTER — Telehealth: Payer: Self-pay | Admitting: Adult Health

## 2018-09-30 ENCOUNTER — Ambulatory Visit: Payer: Medicare Other | Admitting: Adult Health

## 2018-09-30 DIAGNOSIS — R32 Unspecified urinary incontinence: Secondary | ICD-10-CM | POA: Diagnosis not present

## 2018-09-30 NOTE — Telephone Encounter (Signed)
Per [name], advised pt that she will need to go thru her urologist d/t her multiple episodes of UTIs and that she is catherized.  Pt. Expressed understanding and is agreeable.  [name], CMA

## 2018-09-30 NOTE — Telephone Encounter (Signed)
Patient called stating she is having difficulty getting anything done today with her urologist and is having ongoing UTI symptoms, since it is a lot closer for her to come here versus her urologist she is requesting if her family member could bring a urine specimen this afternoon (after 3p) to our office for a culture. Please advise if this is something we can do for the patient.

## 2018-10-02 DIAGNOSIS — R32 Unspecified urinary incontinence: Secondary | ICD-10-CM | POA: Diagnosis not present

## 2018-10-04 DIAGNOSIS — R399 Unspecified symptoms and signs involving the genitourinary system: Secondary | ICD-10-CM | POA: Diagnosis not present

## 2018-10-07 DIAGNOSIS — Z7901 Long term (current) use of anticoagulants: Secondary | ICD-10-CM | POA: Diagnosis not present

## 2018-10-07 LAB — POCT INR: INR: 2.1 — AB (ref 0.9–1.1)

## 2018-10-08 NOTE — Progress Notes (Signed)
Pt informed of dosage instructions per [name], NP.  Pt expressed understanding and is agreeable.  [name], CMA

## 2018-10-21 DIAGNOSIS — Z86718 Personal history of other venous thrombosis and embolism: Secondary | ICD-10-CM | POA: Diagnosis not present

## 2018-10-21 DIAGNOSIS — Z7901 Long term (current) use of anticoagulants: Secondary | ICD-10-CM | POA: Diagnosis not present

## 2018-10-21 LAB — POCT INR: INR: 2 — AB (ref 0.9–1.1)

## 2018-10-22 NOTE — Progress Notes (Signed)
Advised pt to continue current dose of Coumadin.  Pt expressed understanding and is agreeable.  [name], CMA

## 2018-10-29 DIAGNOSIS — R32 Unspecified urinary incontinence: Secondary | ICD-10-CM | POA: Diagnosis not present

## 2018-11-04 ENCOUNTER — Encounter: Payer: Self-pay | Admitting: Adult Health

## 2018-11-04 ENCOUNTER — Telehealth: Payer: Self-pay

## 2018-11-04 DIAGNOSIS — Z7901 Long term (current) use of anticoagulants: Secondary | ICD-10-CM | POA: Diagnosis not present

## 2018-11-04 LAB — POCT INR: INR: 3.1 — AB (ref 2.0–3.0)

## 2018-11-04 NOTE — Telephone Encounter (Signed)
Received fax from [facility] with pt's INR today of 3.1.   Please advise.  [name], CMA

## 2018-11-05 NOTE — Telephone Encounter (Signed)
Pt informed.  Pt expressed understanding and is agreeable.  T. Nelson, CMA  

## 2018-11-05 NOTE — Telephone Encounter (Signed)
Good Afternoon [name], Can you please call Ms. Branscum and share- INR 3.1 Please tell he to hold one day dose of Coumadin. Please ensure that she is following "Coundamin Diet", ie avoid food high in vit k- green leafy greens Thanks! [name]

## 2018-11-11 DIAGNOSIS — M6281 Muscle weakness (generalized): Secondary | ICD-10-CM | POA: Diagnosis not present

## 2018-11-11 DIAGNOSIS — G8220 Paraplegia, unspecified: Secondary | ICD-10-CM | POA: Diagnosis not present

## 2018-11-13 ENCOUNTER — Other Ambulatory Visit: Payer: Self-pay

## 2018-11-13 MED ORDER — WARFARIN SODIUM 4 MG PO TABS: ORAL_TABLET | ORAL | 1 refills | Status: DC

## 2018-11-13 MED ORDER — POTASSIUM CHLORIDE CRYS ER 10 MEQ PO TBCR: 10.0000 meq | EXTENDED_RELEASE_TABLET | Freq: Three times a day (TID) | ORAL | 2 refills | Status: DC

## 2018-11-13 MED ORDER — OXYBUTYNIN CHLORIDE 5 MG PO TABS: 5.0000 mg | ORAL_TABLET | Freq: Two times a day (BID) | ORAL | 1 refills | Status: DC

## 2018-11-13 MED ORDER — WARFARIN SODIUM 2 MG PO TABS: ORAL_TABLET | ORAL | 1 refills | Status: DC

## 2018-11-13 MED ORDER — PRAVASTATIN SODIUM 40 MG PO TABS: 40.0000 mg | ORAL_TABLET | Freq: Every day | ORAL | 1 refills | Status: DC

## 2018-11-13 MED ORDER — OLOPATADINE HCL 0.1 % OP SOLN: OPHTHALMIC | 3 refills | Status: DC

## 2018-11-13 NOTE — Telephone Encounter (Signed)
Patient has changed pharmacies from [facility] to [facility].  New RXs sent to pharmacy d/t change.  [name], CMA

## 2018-11-18 ENCOUNTER — Other Ambulatory Visit: Payer: Self-pay

## 2018-11-18 DIAGNOSIS — Z7901 Long term (current) use of anticoagulants: Secondary | ICD-10-CM

## 2018-11-18 LAB — POCT INR: INR: 2.1 — AB (ref 0.9–1.1)

## 2018-11-18 NOTE — Telephone Encounter (Signed)
Received fax from [facility].  Pt's INR on 11/18/18 was 2.1.  [name], CMA

## 2018-11-18 NOTE — Telephone Encounter (Signed)
Thank you! No change to current therapy [name]

## 2018-11-19 DIAGNOSIS — R32 Unspecified urinary incontinence: Secondary | ICD-10-CM | POA: Diagnosis not present

## 2018-11-19 MED ORDER — BUMETANIDE 1 MG PO TABS: 1.0000 mg | ORAL_TABLET | Freq: Every day | ORAL | 1 refills | Status: DC

## 2018-11-19 MED ORDER — BACLOFEN 20 MG PO TABS: ORAL_TABLET | ORAL | 1 refills | Status: DC

## 2018-11-19 MED ORDER — DIAZEPAM 5 MG PO TABS: 5.0000 mg | ORAL_TABLET | Freq: Two times a day (BID) | ORAL | 0 refills | Status: DC | PRN

## 2018-11-19 MED ORDER — LEVOTHYROXINE SODIUM 50 MCG PO TABS: 50.0000 ug | ORAL_TABLET | Freq: Every day | ORAL | 1 refills | Status: DC

## 2018-11-19 NOTE — Telephone Encounter (Signed)
Pt informed of Coumadin dosing.  Pt also requests new RXs for baclofen, levothyroxine, diazepam and bumex be sent to her new pharmacy, [facility].  [name], CMA

## 2018-11-20 ENCOUNTER — Other Ambulatory Visit: Payer: Self-pay | Admitting: Adult Health

## 2018-11-20 MED ORDER — SILVER SULFADIAZINE 1 % EX CREA: 1.0000 "application " | TOPICAL_CREAM | Freq: Two times a day (BID) | CUTANEOUS | 0 refills | Status: AC | PRN

## 2018-11-20 NOTE — Telephone Encounter (Signed)
We have not prescribed these medications for the patient previously.  Please review and refill if appropriate.  T. Sary Bogie, CMA  

## 2018-11-20 NOTE — Telephone Encounter (Signed)
Patient called states [name] has never prescribed this Rx but she is almost out & needs it :   silver sulfADIAZINE (SILVADENE) 1 % cream [[id number]]   Order Details  Dose: 1 application Route: Topical Frequency: 2 times daily PRN  Indications of Use: Dermal Ulcer  Dispense Quantity: 50 g Refills: 0 Fills remaining: --        Sig: Apply 1 application topically 2 (two) times daily as needed.       Written Date: 07/05/16 Expiration Date: 07/05/17    Start Date: 07/05/16 End Date: --         Ordering Provider: Binnie Rail, MD DEA #:  [id number] NPI:  [id number]      ---Forwarding message to medical assistant that if approved please send to :   [place], [place] [phone] ([phone] (Fax)  --glh

## 2018-12-09 DIAGNOSIS — Z7901 Long term (current) use of anticoagulants: Secondary | ICD-10-CM | POA: Diagnosis not present

## 2018-12-09 LAB — POCT INR: INR: 2 — AB (ref 0.9–1.1)

## 2018-12-10 ENCOUNTER — Telehealth: Payer: Self-pay

## 2018-12-10 NOTE — Telephone Encounter (Signed)
Received fax from [facility].  INR result dated 12/09/2018 is 2.0.  Pt instructed to continue current dose of Coumadin per [name].  [name], CMA

## 2018-12-23 DIAGNOSIS — Z86718 Personal history of other venous thrombosis and embolism: Secondary | ICD-10-CM | POA: Diagnosis not present

## 2018-12-23 LAB — POCT INR: INR: 2.3 — AB (ref 0.9–1.1)

## 2018-12-24 NOTE — Progress Notes (Signed)
Received INR results from [facility].  INR from 12/23/2018 is 2.3.  Please advise re: Coumadin dosing.  [name], CMA

## 2018-12-25 ENCOUNTER — Telehealth: Payer: Self-pay

## 2018-12-25 NOTE — Telephone Encounter (Signed)
Per [name], NP advised pt to continue current dose of Coumadin and recheck in 2 weeks.  Pt expressed understanding and is agreeable. [name], CMA

## 2019-01-01 DIAGNOSIS — R32 Unspecified urinary incontinence: Secondary | ICD-10-CM | POA: Diagnosis not present

## 2019-01-02 DIAGNOSIS — R32 Unspecified urinary incontinence: Secondary | ICD-10-CM | POA: Diagnosis not present

## 2019-01-06 LAB — POCT INR: INR: 2.4 — AB (ref 0.9–1.1)

## 2019-01-20 ENCOUNTER — Telehealth: Payer: Self-pay

## 2019-01-20 DIAGNOSIS — Z7901 Long term (current) use of anticoagulants: Secondary | ICD-10-CM | POA: Diagnosis not present

## 2019-01-20 LAB — POCT INR: INR: 2.6 — AB (ref <=1.1)

## 2019-01-20 NOTE — Telephone Encounter (Signed)
MyChart message sent to pt with results.  [name], CMA

## 2019-01-20 NOTE — Telephone Encounter (Signed)
INR Therapuetic - no change to current regime Thanks! [name]

## 2019-01-20 NOTE — Telephone Encounter (Signed)
Received pt's INR.  INR today is 2.6.  Please advise.  [name], CMA

## 2019-01-23 DIAGNOSIS — R32 Unspecified urinary incontinence: Secondary | ICD-10-CM | POA: Diagnosis not present

## 2019-01-28 DIAGNOSIS — R32 Unspecified urinary incontinence: Secondary | ICD-10-CM | POA: Diagnosis not present

## 2019-01-31 ENCOUNTER — Telehealth: Payer: Self-pay | Admitting: Adult Health

## 2019-01-31 NOTE — Telephone Encounter (Signed)
Forwarding message that [facility] Dir./ Dr  Abner Greenspan called for peer to peer w/ provide regarding wheelchair replacement for patient--- advised him we were expecting call yesterday , 01/30/2019 & that provider is out of office till Wednesday--- Med Dir left phone# [phone] for her to call him.  --glh

## 2019-02-03 ENCOUNTER — Other Ambulatory Visit: Payer: Self-pay | Admitting: Adult Health

## 2019-02-03 DIAGNOSIS — Z7901 Long term (current) use of anticoagulants: Secondary | ICD-10-CM | POA: Diagnosis not present

## 2019-02-03 LAB — POCT INR: INR: 2.2 — AB (ref 0.9–1.1)

## 2019-02-05 DIAGNOSIS — G839 Paralytic syndrome, unspecified: Secondary | ICD-10-CM | POA: Insufficient documentation

## 2019-02-05 NOTE — Assessment & Plan Note (Signed)
Wheelchair bound 

## 2019-02-17 DIAGNOSIS — Z86718 Personal history of other venous thrombosis and embolism: Secondary | ICD-10-CM | POA: Diagnosis not present

## 2019-02-17 DIAGNOSIS — Z7901 Long term (current) use of anticoagulants: Secondary | ICD-10-CM | POA: Diagnosis not present

## 2019-02-17 LAB — POCT INR: INR: 2 — AB (ref 0.9–1.1)

## 2019-02-28 DIAGNOSIS — R32 Unspecified urinary incontinence: Secondary | ICD-10-CM | POA: Diagnosis not present

## 2019-03-07 ENCOUNTER — Ambulatory Visit: Payer: Medicare Other | Admitting: Family Medicine

## 2019-03-10 LAB — POCT INR: INR: 2.4 — AB (ref 0.9–1.1)

## 2019-03-11 ENCOUNTER — Telehealth: Payer: Self-pay

## 2019-03-11 NOTE — Telephone Encounter (Signed)
Pt called stating that her insurance company is requiring her "PCP office" to call for a prior authorization for her urological supplies.  Advised pt that this needs to be handled by her urologist and not our office.  Pt again stated that her insurance company stated that it needs to come for "PCP".  Again, advised pt that, per [name], the prior authorization would need to be handled by urologist since we do not manage this condition nor order her supplies.  Pt became upset and hung up the phone.  I have left message for urologist office, Dr. Tresa Endo, to call to inform them of this issue.  [name], CMA

## 2019-03-13 DIAGNOSIS — R32 Unspecified urinary incontinence: Secondary | ICD-10-CM | POA: Diagnosis not present

## 2019-03-14 DIAGNOSIS — R32 Unspecified urinary incontinence: Secondary | ICD-10-CM | POA: Diagnosis not present

## 2019-03-24 DIAGNOSIS — Z7901 Long term (current) use of anticoagulants: Secondary | ICD-10-CM | POA: Diagnosis not present

## 2019-03-24 LAB — POCT INR: INR: 1.9 — AB (ref 0.9–1.1)

## 2019-03-31 DIAGNOSIS — R32 Unspecified urinary incontinence: Secondary | ICD-10-CM | POA: Diagnosis not present

## 2019-04-01 DIAGNOSIS — R32 Unspecified urinary incontinence: Secondary | ICD-10-CM | POA: Diagnosis not present

## 2019-04-07 DIAGNOSIS — Z86718 Personal history of other venous thrombosis and embolism: Secondary | ICD-10-CM | POA: Diagnosis not present

## 2019-04-14 DIAGNOSIS — R292 Abnormal reflex: Secondary | ICD-10-CM | POA: Diagnosis not present

## 2019-04-14 DIAGNOSIS — G8250 Quadriplegia, unspecified: Secondary | ICD-10-CM | POA: Diagnosis not present

## 2019-04-14 DIAGNOSIS — G894 Chronic pain syndrome: Secondary | ICD-10-CM | POA: Diagnosis not present

## 2019-04-23 DIAGNOSIS — Z7901 Long term (current) use of anticoagulants: Secondary | ICD-10-CM | POA: Diagnosis not present

## 2019-04-23 LAB — POCT INR: INR: 1.9 — AB (ref 0.9–1.1)

## 2019-04-24 ENCOUNTER — Other Ambulatory Visit: Payer: Self-pay | Admitting: Adult Health

## 2019-04-24 ENCOUNTER — Telehealth: Payer: Self-pay

## 2019-04-24 MED ORDER — WARFARIN SODIUM 2 MG PO TABS: ORAL_TABLET | ORAL | 0 refills | Status: DC

## 2019-04-24 MED ORDER — WARFARIN SODIUM 2.5 MG PO TABS: ORAL_TABLET | ORAL | 0 refills | Status: DC

## 2019-04-24 MED ORDER — WARFARIN SODIUM 4 MG PO TABS: ORAL_TABLET | ORAL | 1 refills | Status: DC

## 2019-04-24 NOTE — Telephone Encounter (Signed)
INR results from 04/23/19 is 1.9.  Spoke with pt who verified that her current dose of Coumadin is 4mg  on Monday, Wednesday and Friday and 2mg  Tuesday, Thursday, Saturday, and Sunday.  Pt refuses to make any changes to her Coumadin dose because she thinks that this abnormal reading is related to her "not being fast enough applying the blood to the test strip".  Had a very long conversation with pt regarding the risks of not increasing her Coumadin dose, possible referral to Coumadin clinic or returning to the "Coumadin nurse" at [facility].  Pt expressed understanding of the risks of not increasing Coumadin dose and still indicated that she would not make the changes suggested by [facility].  [name], CMA

## 2019-04-24 NOTE — Progress Notes (Signed)
Previous Coumadin Regime   2mg : Tues, Thurs, Sat, Sun 4mg : Mon, Wed, Fri She has had two readings in a row with sub-therapeutic INRs New regime: 2mg : Tues, Sunday 2.5mg : Thurs, Sat 4mg : Mon, Wed, Fri Change communicated to pt and she vehemently refused to accept coumadin regime. Discussed with pt that she is at increased risk of DVT/PE with sub-therapeutic INR Recommended referral to Coumadin Clinic, she refused

## 2019-04-30 DIAGNOSIS — R32 Unspecified urinary incontinence: Secondary | ICD-10-CM | POA: Diagnosis not present

## 2019-05-05 LAB — POCT INR: INR: 2 — AB (ref <=1.1)

## 2019-05-09 ENCOUNTER — Other Ambulatory Visit: Payer: Self-pay | Admitting: Adult Health

## 2019-05-13 ENCOUNTER — Other Ambulatory Visit: Payer: Self-pay

## 2019-05-13 MED ORDER — WARFARIN SODIUM 2 MG PO TABS: ORAL_TABLET | ORAL | 0 refills | Status: DC

## 2019-05-13 MED ORDER — WARFARIN SODIUM 4 MG PO TABS: ORAL_TABLET | ORAL | 1 refills | Status: DC

## 2019-05-19 LAB — POCT INR: INR: 2.2 — AB (ref 0.9–1.1)

## 2019-05-20 ENCOUNTER — Other Ambulatory Visit: Payer: Self-pay

## 2019-05-20 MED ORDER — WARFARIN SODIUM 2 MG PO TABS: ORAL_TABLET | ORAL | 0 refills | Status: DC

## 2019-05-20 MED ORDER — WARFARIN SODIUM 4 MG PO TABS: ORAL_TABLET | ORAL | 1 refills | Status: DC

## 2019-05-20 NOTE — Progress Notes (Signed)
Received pt's home INR results dated 05/19/2019.  INR is 2.2.  [name], CMA

## 2019-05-28 ENCOUNTER — Other Ambulatory Visit: Payer: Self-pay | Admitting: Adult Health

## 2019-05-28 MED ORDER — SANTYL 250 UNIT/GM EX OINT: 1.0000 "application " | TOPICAL_OINTMENT | Freq: Every day | CUTANEOUS | 0 refills | Status: DC

## 2019-05-28 NOTE — Addendum Note (Signed)
Addended by: Fonnie Mu on: 05/28/2019 11:59 AM   Modules accepted: Orders

## 2019-05-28 NOTE — Telephone Encounter (Signed)
Patient is requesting a refill of Santyl ointment for wound care, I spoke with [name] who was ok sending in an order for this. Please send to [name].

## 2019-05-29 DIAGNOSIS — R32 Unspecified urinary incontinence: Secondary | ICD-10-CM | POA: Diagnosis not present

## 2019-06-02 ENCOUNTER — Telehealth: Payer: Self-pay | Admitting: Adult Health

## 2019-06-02 MED ORDER — SANTYL 250 UNIT/GM EX OINT: 1.0000 "application " | TOPICAL_OINTMENT | Freq: Every day | CUTANEOUS | 0 refills | Status: DC

## 2019-06-02 NOTE — Telephone Encounter (Signed)
Sent MyChart message informing pt that RX has been sent to requested pharmacy.  [name], CMA

## 2019-06-02 NOTE — Telephone Encounter (Signed)
Patient called states [facility] should have been sent this Rx :  c collagenase (SANTYL) ointment [[id number]]   Order Details Dose: 1 application Route: Topical Frequency: Daily  Dispense Quantity: 15 g Refills: 0 Fills remaining: --        Sig: Apply 1 application topically daily.       Written Date: 05/28/19 Expiration Date: 05/27/20    Start Date: 05/28/19 End Date: --         Ordering Provider: -- DEA #:  -- NPI:  --   Authorizing Provider: Esaw Grandchild, NP DEA #:  [id number] NPI:  [id number]   Ordering User:  Esaw Grandchild, NP            Pharmacy:  [id number] - [place], [place] [address]  DEA #:  [id number]  Pharmacy Comments: --     --- Forwarding request to medical assistant to send  Rx to :   [place], [place] [phone] (Phone) [phone] (Fax)   Pt says [facility] has [facility] [address] [facility] [place]

## 2019-06-09 ENCOUNTER — Telehealth: Payer: Self-pay | Admitting: Adult Health

## 2019-06-09 DIAGNOSIS — Z86718 Personal history of other venous thrombosis and embolism: Secondary | ICD-10-CM | POA: Diagnosis not present

## 2019-06-09 LAB — POCT INR: INR: 3.2 — AB (ref 0.9–1.1)

## 2019-06-09 NOTE — Telephone Encounter (Signed)
Pt informed.  Pt states that she taking the warfarin 2mg  Sunday, Tuesday, Thursday, Saturday and she takes 4mg  Monday, Wednesday and Friday.  Pt denies any excessive bruising or bleeding.  Pt expressed understanding and is agreeable.  [name], CMA

## 2019-06-09 NOTE — Telephone Encounter (Signed)
Please advise.  T. Nzinga Ferran, CMA 

## 2019-06-09 NOTE — Telephone Encounter (Signed)
Patient took INR today and says it was at 3.2. So she wanted me to send a message to provider so that clinic staff did not have to wait for faxed report.

## 2019-06-09 NOTE — Telephone Encounter (Signed)
Good Afternoon [name], Can you please call Ms. Hable and instruct her to hold all Coumadin for 3 days, then resume regular regime. Please ask if she is experiencing any unusual bleeding/bruising? Of Note- her last 2 INRs were 2.2, 2.0, 1.9 Please review with her regime to ensure she is taking as directed, pharmacy is filling correctly, etc. Thanks! [name]

## 2019-06-23 ENCOUNTER — Other Ambulatory Visit: Payer: Self-pay | Admitting: Adult Health

## 2019-06-23 DIAGNOSIS — R399 Unspecified symptoms and signs involving the genitourinary system: Secondary | ICD-10-CM | POA: Diagnosis not present

## 2019-06-23 LAB — POCT INR: INR: 2.2 — AB (ref 0.9–1.1)

## 2019-06-23 MED ORDER — DIAZEPAM 5 MG PO TABS: 5.0000 mg | ORAL_TABLET | Freq: Two times a day (BID) | ORAL | 0 refills | Status: DC | PRN

## 2019-06-23 NOTE — Telephone Encounter (Signed)
Pt is now scheduled for 07/27/2019.  [name], CMA

## 2019-06-23 NOTE — Telephone Encounter (Signed)
Patient called requesting a refill of her diazepam, if approved please send to [facility]  Also, patient wants to come have her thyroid levels checked. I advised patient since we have not seen her recently that we may need an OV too, please verify if we can do a straight lab draw for patient as she is not scheduled yet.

## 2019-06-23 NOTE — Addendum Note (Signed)
Addended by: Fonnie Mu on: 06/23/2019 11:14 AM   Modules accepted: Orders

## 2019-06-23 NOTE — Telephone Encounter (Signed)
Good Afternoon [name], Can you please call [name]- She needs an TeleMedicine appt- we have not see her since 08/2018 She was advised to have CPE 02/2019 She has not had fasting labs since 03/2018 Last CMP 05/2018 She needs a FULL SET OF FASTING LABS and OV- preferably [facility] Once she schedules her labs and OV- I will refill her Diazepam Thanks! [name]

## 2019-06-26 ENCOUNTER — Ambulatory Visit: Payer: Medicare HMO | Admitting: Adult Health

## 2019-06-26 ENCOUNTER — Other Ambulatory Visit: Payer: Self-pay | Admitting: Adult Health

## 2019-06-26 ENCOUNTER — Telehealth: Payer: Self-pay | Admitting: Adult Health

## 2019-06-26 MED ORDER — DIAZEPAM 5 MG PO TABS: 5.0000 mg | ORAL_TABLET | Freq: Two times a day (BID) | ORAL | 0 refills | Status: DC | PRN

## 2019-06-26 NOTE — Telephone Encounter (Signed)
Short rx sent in Thanks! [name]

## 2019-06-26 NOTE — Telephone Encounter (Signed)
error 

## 2019-06-26 NOTE — Telephone Encounter (Signed)
Please review pt request.  [name], CMA

## 2019-06-26 NOTE — Telephone Encounter (Signed)
Patient is requesting a refill of her diazepam as she will be out of this med by tomorrow, she is requesting a refill sent to [facility].
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
## 2019-07-01 ENCOUNTER — Encounter: Payer: Self-pay | Admitting: Adult Health

## 2019-07-01 ENCOUNTER — Other Ambulatory Visit: Payer: Self-pay

## 2019-07-01 ENCOUNTER — Ambulatory Visit (INDEPENDENT_AMBULATORY_CARE_PROVIDER_SITE_OTHER): Payer: Medicare HMO | Admitting: Adult Health

## 2019-07-01 VITALS — BP 121/60 | HR 51 | Temp 98.1°F

## 2019-07-01 DIAGNOSIS — Z7901 Long term (current) use of anticoagulants: Secondary | ICD-10-CM

## 2019-07-01 DIAGNOSIS — E785 Hyperlipidemia, unspecified: Secondary | ICD-10-CM | POA: Diagnosis not present

## 2019-07-01 DIAGNOSIS — Z0000 Encounter for general adult medical examination without abnormal findings: Secondary | ICD-10-CM

## 2019-07-01 DIAGNOSIS — N319 Neuromuscular dysfunction of bladder, unspecified: Secondary | ICD-10-CM

## 2019-07-01 DIAGNOSIS — R292 Abnormal reflex: Secondary | ICD-10-CM | POA: Diagnosis not present

## 2019-07-01 DIAGNOSIS — R739 Hyperglycemia, unspecified: Secondary | ICD-10-CM

## 2019-07-01 DIAGNOSIS — E038 Other specified hypothyroidism: Secondary | ICD-10-CM | POA: Diagnosis not present

## 2019-07-01 DIAGNOSIS — I959 Hypotension, unspecified: Secondary | ICD-10-CM | POA: Diagnosis not present

## 2019-07-01 DIAGNOSIS — E559 Vitamin D deficiency, unspecified: Secondary | ICD-10-CM | POA: Diagnosis not present

## 2019-07-01 DIAGNOSIS — Z5181 Encounter for therapeutic drug level monitoring: Secondary | ICD-10-CM

## 2019-07-01 DIAGNOSIS — D508 Other iron deficiency anemias: Secondary | ICD-10-CM | POA: Diagnosis not present

## 2019-07-01 MED ORDER — ACETAMINOPHEN-CODEINE #3 300-30 MG PO TABS: 1.0000 | ORAL_TABLET | ORAL | 0 refills | Status: DC | PRN

## 2019-07-01 MED ORDER — ALPRAZOLAM 0.5 MG PO TABS: ORAL_TABLET | ORAL | 0 refills | Status: DC

## 2019-07-01 NOTE — Assessment & Plan Note (Signed)
TSH drawn  

## 2019-07-01 NOTE — Assessment & Plan Note (Signed)
Due to spinal cord injury Treated with Tylenol #3 PEN Will provide 15 tablets/year She estimates to use 1-3 tablets/month [facility] Controlled Substance Database reviewed- no aberrancies noted

## 2019-07-01 NOTE — Assessment & Plan Note (Signed)
Also discussed that on several occasions she has chosen not to take coumadin adjustment recommendations after her INR results have been received. Discussed that she may be more receptive to recommendations from the Coumadin clinic. She is agreeable to referral, if the clinic will accept her bi-weekly INR results faxed- as she has been providing [place] the last several years. Current Regime- Warfarin 2mg - Tues, Thurs, Sat, Sun Warfarin 4mg - Mon, Wed, Fri

## 2019-07-01 NOTE — Progress Notes (Unsigned)
When pt signed controlled substance contract, pt stated that she felt that only #10 of alprazolam may not be enough for a year.  Advised pt that this is all that [name] is willing to prescribe and that if she does not sign contract, then [facility] will not prescribe this medication for her at all.  Pt then stated that her sisters take alprazolam as well and she can get more tablets from them if needed.  [name] informed of pt's comment.  [name], CMA

## 2019-07-01 NOTE — Assessment & Plan Note (Signed)
Continue all medications as directed. Continue to check and report INRs. Continue to follow-up with Urologist as directed. Controlled Substance contract completed. Referral to Coumadin clinic placed- will refer if they are agreeable to accepting your faxed INR results. We will call you when you lab results are available. Please send [place] any home health supply need requests, ie: gloves, gauze that is not related to urinary catheter. Continue to social distance and wear a mask when in public. Follow-up 6 months, sooner if needed.

## 2019-07-01 NOTE — Assessment & Plan Note (Signed)
BP stable- 121/60, HR 51 She denies dizziness

## 2019-07-01 NOTE — Patient Instructions (Addendum)
Mediterranean Diet A Mediterranean diet refers to food and lifestyle choices that are based on the traditions of countries located on the [employer]. This way of eating has been shown to help prevent certain conditions and improve outcomes for people who have chronic diseases, like kidney disease and heart disease. What are tips for following this plan? Lifestyle  Cook and eat meals together with your family, when possible.  Drink enough fluid to keep your urine clear or pale yellow.  Be physically active every day. This includes: ? Aerobic exercise like running or swimming. ? Leisure activities like gardening, walking, or housework.  Get 7-8 hours of sleep each night.  Reading food labels   Check the serving size of packaged foods. For foods such as rice and pasta, the serving size refers to the amount of cooked product, not dry.  Check the total fat in packaged foods. Avoid foods that have saturated fat or trans fats.  Check the ingredients list for added sugars, such as corn syrup. Shopping  At the grocery store, buy most of your food from the areas near the walls of the store. This includes: ? Fresh fruits and vegetables (produce). ? Grains, beans, nuts, and seeds. Some of these may be available in unpackaged forms or large amounts (in bulk). ? Fresh seafood. ? Poultry and eggs. ? Low-fat dairy products.  Buy whole ingredients instead of prepackaged foods.  Buy fresh fruits and vegetables in-season from local farmers markets.  Buy frozen fruits and vegetables in resealable bags.  If you do not have access to quality fresh seafood, buy precooked frozen shrimp or canned fish, such as tuna, salmon, or sardines.  Buy small amounts of raw or cooked vegetables, salads, or olives from the deli or salad bar at your store.  Stock your pantry so you always have certain foods on hand, such as olive oil, canned tuna, canned tomatoes, rice, pasta, and beans. Cooking  Cook  foods with extra-virgin olive oil instead of using butter or other vegetable oils.  Have meat as a side dish, and have vegetables or grains as your main dish. This means having meat in small portions or adding small amounts of meat to foods like pasta or stew.  Use beans or vegetables instead of meat in common dishes like chili or lasagna.  Experiment with different cooking methods. Try roasting or broiling vegetables instead of steaming or sauteing them.  Add frozen vegetables to soups, stews, pasta, or rice.  Add nuts or seeds for added healthy fat at each meal. You can add these to yogurt, salads, or vegetable dishes.  Marinate fish or vegetables using olive oil, lemon juice, garlic, and fresh herbs. Meal planning   Plan to eat 1 vegetarian meal one day each week. Try to work up to 2 vegetarian meals, if possible.  Eat seafood 2 or more times a week.  Have healthy snacks readily available, such as: ? Vegetable sticks with hummus. ? [place] yogurt. ? Fruit and nut trail mix.  Eat balanced meals throughout the week. This includes: ? Fruit: 2-3 servings a day ? Vegetables: 4-5 servings a day ? Low-fat dairy: 2 servings a day ? Fish, poultry, or lean meat: 1 serving a day ? Beans and legumes: 2 or more servings a week ? Nuts and seeds: 1-2 servings a day ? Whole grains: 6-8 servings a day ? Extra-virgin olive oil: 3-4 servings a day  Limit red meat and sweets to only a few servings a month What  are my food choices?  Mediterranean diet ? Recommended  Grains: Whole-grain pasta. Brown rice. Bulgar wheat. Polenta. Couscous. Whole-wheat bread. [place].  Vegetables: Artichokes. Beets. Broccoli. Cabbage. Carrots. Eggplant. Green beans. Chard. Kale. Spinach. Onions. Leeks. Peas. Squash. Tomatoes. Peppers. Radishes.  Fruits: Apples. Apricots. Avocado. Berries. Bananas. Cherries. Dates. Figs. Grapes. Lemons. Melon. Oranges. Peaches. Plums. Pomegranate.  Meats and other  protein foods: Beans. Almonds. Sunflower seeds. Pine nuts. Peanuts. [place]. Salmon. Scallops. Shrimp. [place]. Tilapia. Clams. Oysters. Eggs.  Dairy: Low-fat milk. Cheese. Greek yogurt.  Beverages: Water. Red wine. Herbal tea.  Fats and oils: Extra virgin olive oil. Avocado oil. Grape seed oil.  Sweets and desserts: [place] yogurt with honey. Baked apples. Poached pears. Trail mix.  Seasoning and other foods: Basil. Cilantro. Coriander. Cumin. Mint. Parsley. Sage. Rosemary. Tarragon. Garlic. Oregano. Thyme. Pepper. Balsalmic vinegar. Tahini. Hummus. Tomato sauce. Olives. Mushrooms. ? Limit these  Grains: Prepackaged pasta or rice dishes. Prepackaged cereal with added sugar.  Vegetables: Deep fried potatoes (french fries).  Fruits: Fruit canned in syrup.  Meats and other protein foods: Beef. Pork. Lamb. Poultry with skin. Hot dogs. Berniece Salines.  Dairy: Ice cream. Sour cream. Whole milk.  Beverages: Juice. Sugar-sweetened soft drinks. Beer. Liquor and spirits.  Fats and oils: Butter. Canola oil. Vegetable oil. Beef fat (tallow). Lard.  Sweets and desserts: Cookies. Cakes. Pies. Candy.  Seasoning and other foods: Mayonnaise. Premade sauces and marinades. The items listed may not be a complete list. Talk with your dietitian about what dietary choices are right for you. Summary  The Mediterranean diet includes both food and lifestyle choices.  Eat a variety of fresh fruits and vegetables, beans, nuts, seeds, and whole grains.  Limit the amount of red meat and sweets that you eat.  This information is not intended to replace advice given to you by your health care provider. Make sure you discuss any questions you have with your health care provider. Document Released: 07/13/2016 Document Revised: 07/20/2016 Document Reviewed: 07/13/2016 Elsevier Patient Education  2020 [place] all medications as directed. Continue to check and report INRs. Continue to follow-up with  Urologist as directed. Controlled Substance contract completed. Referral to Coumadin clinic placed- will refer if they are agreeable to accepting your faxed INR results. We will call you when you lab results are available. Please send [place] any home health supply need requests, ie: gloves, gauze that is not related to urinary catheter. Continue to social distance and wear a mask when in public. Follow-up 6 months, sooner if needed. NICE TO SEE YOU!

## 2019-07-01 NOTE — Assessment & Plan Note (Signed)
Followed by Urology Suprapubic catheter Recurrent UTI Diazepam 5mg  BID PRN for bladder spasms [facility] Controlled Substance Database reviewed- she is requesting refills on appropriate basis

## 2019-07-02 ENCOUNTER — Telehealth: Payer: Self-pay | Admitting: Adult Health

## 2019-07-02 ENCOUNTER — Other Ambulatory Visit: Payer: Self-pay | Admitting: Adult Health

## 2019-07-02 LAB — LIPID PANEL
Chol/HDL Ratio: 4 ratio (ref 0.0–4.4)
Cholesterol, Total: 182 mg/dL (ref 100–199)
HDL: 46 mg/dL (ref >39)
LDL Calculated: 112 mg/dL — ABNORMAL HIGH (ref 0–99)
Triglycerides: 120 mg/dL (ref 0–149)
VLDL Cholesterol Cal: 24 mg/dL (ref 5–40)

## 2019-07-02 LAB — CBC WITH DIFFERENTIAL/PLATELET
Basophils Absolute: 0 10*3/uL (ref 0.0–0.2)
Basos: 1 %
EOS (ABSOLUTE): 0.1 10*3/uL (ref 0.0–0.4)
Eos: 1 %
Hematocrit: 38 % (ref 34.0–46.6)
Hemoglobin: 12.8 g/dL (ref 11.1–15.9)
Immature Grans (Abs): 0 10*3/uL (ref 0.0–0.1)
Immature Granulocytes: 0 %
Lymphocytes Absolute: 2 10*3/uL (ref 0.7–3.1)
Lymphs: 31 %
MCH: 31.1 pg (ref 26.6–33.0)
MCHC: 33.7 g/dL (ref 31.5–35.7)
MCV: 92 fL (ref 79–97)
Monocytes Absolute: 0.5 10*3/uL (ref 0.1–0.9)
Monocytes: 7 %
Neutrophils Absolute: 4 10*3/uL (ref 1.4–7.0)
Neutrophils: 60 %
Platelets: 185 10*3/uL (ref 150–450)
RBC: 4.12 x10E6/uL (ref 3.77–5.28)
RDW: 13 % (ref 11.7–15.4)
WBC: 6.6 10*3/uL (ref 3.4–10.8)

## 2019-07-02 LAB — COMPREHENSIVE METABOLIC PANEL
ALT: 18 IU/L (ref 0–32)
AST: 20 IU/L (ref 0–40)
Albumin/Globulin Ratio: 1 — ABNORMAL LOW (ref 1.2–2.2)
Albumin: 3.5 g/dL — ABNORMAL LOW (ref 3.8–4.8)
Alkaline Phosphatase: 102 IU/L (ref 39–117)
BUN/Creatinine Ratio: 33 — ABNORMAL HIGH (ref 12–28)
BUN: 12 mg/dL (ref 8–27)
Bilirubin Total: 0.3 mg/dL (ref 0.0–1.2)
CO2: 27 mmol/L (ref 20–29)
Calcium: 9.2 mg/dL (ref 8.7–10.3)
Chloride: 100 mmol/L (ref 96–106)
Creatinine, Ser: 0.36 mg/dL — ABNORMAL LOW (ref 0.57–1.00)
GFR calc Af Amer: 132 mL/min/{1.73_m2} (ref >59)
GFR calc non Af Amer: 114 mL/min/{1.73_m2} (ref >59)
Globulin, Total: 3.5 g/dL (ref 1.5–4.5)
Glucose: 90 mg/dL (ref 65–99)
Potassium: 3.4 mmol/L — ABNORMAL LOW (ref 3.5–5.2)
Sodium: 144 mmol/L (ref 134–144)
Total Protein: 7 g/dL (ref 6.0–8.5)

## 2019-07-02 LAB — TSH: TSH: 4.04 u[IU]/mL (ref 0.450–4.500)

## 2019-07-02 LAB — HEMOGLOBIN A1C
Est. average glucose Bld gHb Est-mCnc: 100 mg/dL
Hgb A1c MFr Bld: 5.1 % (ref 4.8–5.6)

## 2019-07-02 LAB — VITAMIN D 25 HYDROXY (VIT D DEFICIENCY, FRACTURES): Vit D, 25-Hydroxy: 64.8 ng/mL (ref 30.0–100.0)

## 2019-07-02 MED ORDER — ACETAMINOPHEN-CODEINE #3 300-30 MG PO TABS: 1.0000 | ORAL_TABLET | ORAL | 0 refills | Status: DC | PRN

## 2019-07-02 MED ORDER — ALPRAZOLAM 0.5 MG PO TABS: ORAL_TABLET | ORAL | 0 refills | Status: DC

## 2019-07-02 NOTE — Telephone Encounter (Signed)
FYI only.  [name], CMA

## 2019-07-02 NOTE — Telephone Encounter (Signed)
Patient called states went to [facility] to pick up (these Rxs) but was told due to them being " Controlled Substances "  Law is that they have to go through :   [place], [place] (Phone) [phone] (Fax)    1)-- ALPRAZolam Duanne Moron) 0.5 MG tablet [[id number]]   Order Details Dose, Route, Frequency: As Directed  Dispense Quantity: 10 tablet Refills: 0 Fills remaining: --        Sig: 1.2 tablet as needed for anxiety r/t to car travel- max 10 tablets/year       2)--- acetaminophen-codeine (TYLENOL #3) 300-30 MG tablet [[id number]]   Order Details Dose: 1-2 tablet Route: Oral Frequency: Every 4 hours PRN for moderate pain, severe pain  Dispense Quantity: 15 tablet Refills: 0 Fills remaining: --        Sig: Take 1-2 tablets by mouth every 4 (four) hours as needed for moderate pain or severe pain. Max 15 tablets/year       3)--- diazepam (VALIUM) 5 MG tablet [[id number]]   Order Details Dose: 5 mg Route: Oral Frequency: 2 times daily PRN  Dispense Quantity: 14 tablet Refills: 0 Fills remaining: --        Sig: Take 1 tablet (5 mg total) by mouth 2 (two) times daily as needed. Temp fill until mail order will arrive     ----Forwarding message to medical assistant---- if any questions or concerns pls  Call pt [phone].  --glh

## 2019-07-02 NOTE — Telephone Encounter (Signed)
Patient called states she was trying to go get the prescriptions from [place] but wanted to make sure they were correct & had been sent----advised our system show send 7/28 @11 :23am  For Xanax & Tylenol#3 for a year--- Valium sent 7/23 .  ---- Patient also called to notify provider that there are infections her cathter sites & home health found that she needs antibiotics (to be prescribed by Dr .Tresa Endo) just wanted Katy to be aware.  --Forwarding message to medical assistant.  --Dion Body

## 2019-07-03 ENCOUNTER — Encounter: Payer: Self-pay | Admitting: Adult Health

## 2019-07-03 DIAGNOSIS — R32 Unspecified urinary incontinence: Secondary | ICD-10-CM | POA: Diagnosis not present

## 2019-07-07 DIAGNOSIS — L97419 Non-pressure chronic ulcer of right heel and midfoot with unspecified severity: Secondary | ICD-10-CM | POA: Diagnosis not present

## 2019-07-07 DIAGNOSIS — I739 Peripheral vascular disease, unspecified: Secondary | ICD-10-CM | POA: Diagnosis not present

## 2019-07-07 DIAGNOSIS — L97523 Non-pressure chronic ulcer of other part of left foot with necrosis of muscle: Secondary | ICD-10-CM | POA: Diagnosis not present

## 2019-07-07 DIAGNOSIS — S91302A Unspecified open wound, left foot, initial encounter: Secondary | ICD-10-CM | POA: Diagnosis not present

## 2019-07-07 DIAGNOSIS — S91301A Unspecified open wound, right foot, initial encounter: Secondary | ICD-10-CM | POA: Diagnosis not present

## 2019-07-07 DIAGNOSIS — L97511 Non-pressure chronic ulcer of other part of right foot limited to breakdown of skin: Secondary | ICD-10-CM | POA: Diagnosis not present

## 2019-07-07 DIAGNOSIS — M7662 Achilles tendinitis, left leg: Secondary | ICD-10-CM | POA: Diagnosis not present

## 2019-07-07 DIAGNOSIS — M24572 Contracture, left ankle: Secondary | ICD-10-CM | POA: Diagnosis not present

## 2019-07-07 DIAGNOSIS — L89629 Pressure ulcer of left heel, unspecified stage: Secondary | ICD-10-CM | POA: Diagnosis not present

## 2019-07-07 LAB — POCT INR

## 2019-07-08 DIAGNOSIS — M79661 Pain in right lower leg: Secondary | ICD-10-CM | POA: Diagnosis not present

## 2019-07-08 DIAGNOSIS — M25572 Pain in left ankle and joints of left foot: Secondary | ICD-10-CM | POA: Diagnosis not present

## 2019-07-08 DIAGNOSIS — M25571 Pain in right ankle and joints of right foot: Secondary | ICD-10-CM | POA: Diagnosis not present

## 2019-07-10 ENCOUNTER — Telehealth: Payer: Self-pay

## 2019-07-10 NOTE — Telephone Encounter (Signed)
Received fax from [facility] that INR dated 07/07/2019 is 2.6.  [name], CMA

## 2019-07-11 NOTE — Telephone Encounter (Signed)
INR 2.6  No change to current coumadin therapy Thanks! [name]

## 2019-07-14 DIAGNOSIS — Z7901 Long term (current) use of anticoagulants: Secondary | ICD-10-CM | POA: Insufficient documentation

## 2019-07-14 DIAGNOSIS — N319 Neuromuscular dysfunction of bladder, unspecified: Secondary | ICD-10-CM | POA: Insufficient documentation

## 2019-07-14 DIAGNOSIS — L89509 Pressure ulcer of unspecified ankle, unspecified stage: Secondary | ICD-10-CM | POA: Insufficient documentation

## 2019-07-14 DIAGNOSIS — E785 Hyperlipidemia, unspecified: Secondary | ICD-10-CM | POA: Insufficient documentation

## 2019-07-14 NOTE — Telephone Encounter (Signed)
MyChart message sent to pt with instructions.  [name], CMA

## 2019-07-15 ENCOUNTER — Telehealth: Payer: Self-pay | Admitting: Adult Health

## 2019-07-15 ENCOUNTER — Encounter: Payer: Self-pay | Admitting: Adult Health

## 2019-07-15 DIAGNOSIS — G8250 Quadriplegia, unspecified: Secondary | ICD-10-CM | POA: Diagnosis not present

## 2019-07-15 DIAGNOSIS — L03115 Cellulitis of right lower limb: Secondary | ICD-10-CM | POA: Diagnosis not present

## 2019-07-15 DIAGNOSIS — Z22322 Carrier or suspected carrier of Methicillin resistant Staphylococcus aureus: Secondary | ICD-10-CM | POA: Diagnosis not present

## 2019-07-15 DIAGNOSIS — L89529 Pressure ulcer of left ankle, unspecified stage: Secondary | ICD-10-CM | POA: Diagnosis not present

## 2019-07-15 NOTE — Telephone Encounter (Signed)
Pt states that she saw ID today and that the wound center would not see her until she saw ID.  She will be taking Cipro and doxycycline x 2 weeks and is to f/u with ID in 1 week.  Pt states that ID does not believe that she needs to have the hardware removed.  Please advise what pt is to do re: INRs and Coumadin management while she is on these antibiotics.  [name], CMA

## 2019-07-15 NOTE — Telephone Encounter (Signed)
Patient was returning a call about her foot wound. Podiatry sent her to Infectious Disease and was placed on Cipro and doxycyline for the infection. She would like to speak with clinic staff about her coumadin management during these courses of abx. Please advise

## 2019-07-16 ENCOUNTER — Telehealth: Payer: Self-pay

## 2019-07-16 ENCOUNTER — Telehealth: Payer: Self-pay | Admitting: Adult Health

## 2019-07-16 DIAGNOSIS — L03115 Cellulitis of right lower limb: Secondary | ICD-10-CM | POA: Insufficient documentation

## 2019-07-16 DIAGNOSIS — Z22322 Carrier or suspected carrier of Methicillin resistant Staphylococcus aureus: Secondary | ICD-10-CM | POA: Insufficient documentation

## 2019-07-16 LAB — POCT INR: INR: 2 — AB (ref 0.9–1.1)

## 2019-07-16 NOTE — Telephone Encounter (Signed)
Good Evening [name], Please have her check level again in 3 days and send MyChart message I will see it on Sat Do not change her current Coumadin therapy since INR is 2 Thanks! [name]

## 2019-07-16 NOTE — Telephone Encounter (Signed)
Patient called during lunch and left her INR on the VM, she is at 2.0 and did not say she was having any difficulties. Sending this for review to clinic staff

## 2019-07-16 NOTE — Telephone Encounter (Signed)
Please advise.  T. Nelson, CMA 

## 2019-07-16 NOTE — Telephone Encounter (Signed)
Called pt since she has not responded to [employer].  Pt states that she took her first doses of doxycycline and Cipro last night and she does not remember how the Cipro affected INR in the past.  Pt will check INR today and forward [place] the results.  Pt understands that she is to check INRs every 3-5 days.  [name], CMA

## 2019-07-17 NOTE — Telephone Encounter (Signed)
Pt informed.  Pt states that her computer hard drive is currently not working and she is unsure if she will be able to send a MyChart message with her reading.  Advised pt that if she is unable to send MyChart message, then she needs to call us first thing Monday morning with her reading.  Pt expressed understanding and is agreeable.  [name], CMA

## 2019-07-20 DIAGNOSIS — Z86718 Personal history of other venous thrombosis and embolism: Secondary | ICD-10-CM | POA: Diagnosis not present

## 2019-07-20 LAB — POCT INR: INR: 2.1 — AB (ref 0.9–1.1)

## 2019-07-21 ENCOUNTER — Telehealth: Payer: Self-pay

## 2019-07-21 NOTE — Telephone Encounter (Signed)
No change to her current coumadin regime INR is therapeutic Please have her continue to send INR every 3-4 days while she is on current dbl ABX therapy Thanks! [name]

## 2019-07-21 NOTE — Telephone Encounter (Signed)
Pt informed.  Pt expressed understanding and is agreeable.  T. Trei Schoch, CMA  

## 2019-07-21 NOTE — Telephone Encounter (Signed)
Received fax from [facility].  Pt's INR from 07/20/2019 is 2.1.  Please advise.  [name], CMA

## 2019-07-22 DIAGNOSIS — Z22322 Carrier or suspected carrier of Methicillin resistant Staphylococcus aureus: Secondary | ICD-10-CM | POA: Diagnosis not present

## 2019-07-22 DIAGNOSIS — L89529 Pressure ulcer of left ankle, unspecified stage: Secondary | ICD-10-CM | POA: Diagnosis not present

## 2019-07-22 DIAGNOSIS — L03115 Cellulitis of right lower limb: Secondary | ICD-10-CM | POA: Diagnosis not present

## 2019-07-24 LAB — POCT INR: INR: 1.9 — AB (ref 0.9–1.1)

## 2019-07-28 DIAGNOSIS — L89524 Pressure ulcer of left ankle, stage 4: Secondary | ICD-10-CM | POA: Diagnosis not present

## 2019-07-28 DIAGNOSIS — L89893 Pressure ulcer of other site, stage 3: Secondary | ICD-10-CM | POA: Diagnosis not present

## 2019-07-28 DIAGNOSIS — G8250 Quadriplegia, unspecified: Secondary | ICD-10-CM | POA: Diagnosis not present

## 2019-07-30 DIAGNOSIS — L89612 Pressure ulcer of right heel, stage 2: Secondary | ICD-10-CM | POA: Diagnosis not present

## 2019-07-30 LAB — POCT INR: INR: 2.2 — AB (ref 0.9–1.1)

## 2019-08-04 ENCOUNTER — Telehealth: Payer: Self-pay | Admitting: Adult Health

## 2019-08-04 NOTE — Telephone Encounter (Signed)
Patient called states her computer is broken & she has no way to send INR report --- forwarding message to medical asst to contact pateitn @ [phone] to advise .  --glh

## 2019-08-05 NOTE — Telephone Encounter (Signed)
Pt advised to continue current dosage of Coumadin.  Pt expressed understanding and is agreeable.  [name], CMA

## 2019-08-08 ENCOUNTER — Other Ambulatory Visit: Payer: Self-pay | Admitting: Adult Health

## 2019-08-08 DIAGNOSIS — R32 Unspecified urinary incontinence: Secondary | ICD-10-CM | POA: Diagnosis not present

## 2019-08-12 DIAGNOSIS — Z22322 Carrier or suspected carrier of Methicillin resistant Staphylococcus aureus: Secondary | ICD-10-CM | POA: Diagnosis not present

## 2019-08-12 DIAGNOSIS — L03115 Cellulitis of right lower limb: Secondary | ICD-10-CM | POA: Diagnosis not present

## 2019-08-12 DIAGNOSIS — Z792 Long term (current) use of antibiotics: Secondary | ICD-10-CM | POA: Diagnosis not present

## 2019-08-18 DIAGNOSIS — L89893 Pressure ulcer of other site, stage 3: Secondary | ICD-10-CM | POA: Diagnosis not present

## 2019-08-18 DIAGNOSIS — L89524 Pressure ulcer of left ankle, stage 4: Secondary | ICD-10-CM | POA: Diagnosis not present

## 2019-08-25 LAB — PROTIME-INR: INR: 3 — AB (ref 0.9–1.1)

## 2019-08-29 DIAGNOSIS — L89612 Pressure ulcer of right heel, stage 2: Secondary | ICD-10-CM | POA: Diagnosis not present

## 2019-09-08 DIAGNOSIS — G8250 Quadriplegia, unspecified: Secondary | ICD-10-CM | POA: Diagnosis not present

## 2019-09-08 DIAGNOSIS — L89524 Pressure ulcer of left ankle, stage 4: Secondary | ICD-10-CM | POA: Diagnosis not present

## 2019-09-08 DIAGNOSIS — S91002A Unspecified open wound, left ankle, initial encounter: Secondary | ICD-10-CM | POA: Diagnosis not present

## 2019-09-08 DIAGNOSIS — L89893 Pressure ulcer of other site, stage 3: Secondary | ICD-10-CM | POA: Diagnosis not present

## 2019-09-09 DIAGNOSIS — R32 Unspecified urinary incontinence: Secondary | ICD-10-CM | POA: Diagnosis not present

## 2019-09-09 DIAGNOSIS — Z86718 Personal history of other venous thrombosis and embolism: Secondary | ICD-10-CM | POA: Diagnosis not present

## 2019-09-09 LAB — POCT INR: INR: 2.5 — AB (ref 0.9–1.1)

## 2019-09-15 ENCOUNTER — Telehealth: Payer: Self-pay | Admitting: Adult Health

## 2019-09-15 NOTE — Telephone Encounter (Signed)
[employer]'s medical management group needs Provider to call & talk to them regarding Medical Necessity of units ( Patient is requesting over & above Ins Co's allowed amount .  Please call Kenney Houseman / [employer] rep @ [phone] or fax letter of medical necessity & supporting medical records to [phone])   --glh

## 2019-09-15 NOTE — Telephone Encounter (Signed)
Please advise.  T. Nelson, CMA 

## 2019-09-15 NOTE — Telephone Encounter (Signed)
What does "Medical Necessity Units" mean Thanks! [name]

## 2019-09-16 NOTE — Telephone Encounter (Signed)
Spoke with [place] at [facility].  She was requesting letter of medical necessity for cath supplies.  Rosamaria Lints that she will have to contact pt's urologist, Dr. Tresa Endo for any orders/ medical necessity letters.  [name] expressed understanding and is agreeable.  [name], CMA

## 2019-09-23 LAB — POCT INR: INR: 2.5 — AB (ref 0.9–1.1)

## 2019-09-25 NOTE — Progress Notes (Signed)
MyChart message sent to pt to continue same dose of Coumadin and repeat INR in 2 weeks.  [name], CMA

## 2019-09-29 DIAGNOSIS — G8250 Quadriplegia, unspecified: Secondary | ICD-10-CM | POA: Diagnosis not present

## 2019-09-29 DIAGNOSIS — L89524 Pressure ulcer of left ankle, stage 4: Secondary | ICD-10-CM | POA: Diagnosis not present

## 2019-09-29 DIAGNOSIS — L97122 Non-pressure chronic ulcer of left thigh with fat layer exposed: Secondary | ICD-10-CM | POA: Diagnosis not present

## 2019-09-29 DIAGNOSIS — L89612 Pressure ulcer of right heel, stage 2: Secondary | ICD-10-CM | POA: Diagnosis not present

## 2019-09-29 DIAGNOSIS — L89893 Pressure ulcer of other site, stage 3: Secondary | ICD-10-CM | POA: Diagnosis not present

## 2019-09-29 DIAGNOSIS — L97112 Non-pressure chronic ulcer of right thigh with fat layer exposed: Secondary | ICD-10-CM | POA: Diagnosis not present

## 2019-10-06 DIAGNOSIS — N3020 Other chronic cystitis without hematuria: Secondary | ICD-10-CM | POA: Diagnosis not present

## 2019-10-06 DIAGNOSIS — R339 Retention of urine, unspecified: Secondary | ICD-10-CM | POA: Diagnosis not present

## 2019-10-06 DIAGNOSIS — G8250 Quadriplegia, unspecified: Secondary | ICD-10-CM | POA: Diagnosis not present

## 2019-10-06 DIAGNOSIS — N2889 Other specified disorders of kidney and ureter: Secondary | ICD-10-CM | POA: Diagnosis not present

## 2019-10-06 DIAGNOSIS — N319 Neuromuscular dysfunction of bladder, unspecified: Secondary | ICD-10-CM | POA: Diagnosis not present

## 2019-10-07 ENCOUNTER — Telehealth: Payer: Self-pay | Admitting: Adult Health

## 2019-10-07 LAB — POCT INR: INR: 2 — AB (ref 0.9–1.1)

## 2019-10-07 NOTE — Telephone Encounter (Signed)
Patient's date of flu shot was 10/01/19. Given by [facility] nurse from our supplies.

## 2019-10-09 DIAGNOSIS — L89612 Pressure ulcer of right heel, stage 2: Secondary | ICD-10-CM | POA: Diagnosis not present

## 2019-10-14 DIAGNOSIS — R32 Unspecified urinary incontinence: Secondary | ICD-10-CM | POA: Diagnosis not present

## 2019-10-14 DIAGNOSIS — N319 Neuromuscular dysfunction of bladder, unspecified: Secondary | ICD-10-CM | POA: Diagnosis not present

## 2019-10-15 DIAGNOSIS — N319 Neuromuscular dysfunction of bladder, unspecified: Secondary | ICD-10-CM | POA: Diagnosis not present

## 2019-10-15 DIAGNOSIS — R32 Unspecified urinary incontinence: Secondary | ICD-10-CM | POA: Diagnosis not present

## 2019-10-20 DIAGNOSIS — L89524 Pressure ulcer of left ankle, stage 4: Secondary | ICD-10-CM | POA: Diagnosis not present

## 2019-10-20 DIAGNOSIS — L97112 Non-pressure chronic ulcer of right thigh with fat layer exposed: Secondary | ICD-10-CM | POA: Diagnosis not present

## 2019-10-20 DIAGNOSIS — G8250 Quadriplegia, unspecified: Secondary | ICD-10-CM | POA: Diagnosis not present

## 2019-10-20 DIAGNOSIS — L97122 Non-pressure chronic ulcer of left thigh with fat layer exposed: Secondary | ICD-10-CM | POA: Diagnosis not present

## 2019-10-20 DIAGNOSIS — L89893 Pressure ulcer of other site, stage 3: Secondary | ICD-10-CM | POA: Diagnosis not present

## 2019-10-21 LAB — POCT INR: INR: 2.5 — AB (ref 0.9–1.1)

## 2019-10-22 ENCOUNTER — Other Ambulatory Visit: Payer: Self-pay | Admitting: Adult Health

## 2019-10-29 DIAGNOSIS — L89612 Pressure ulcer of right heel, stage 2: Secondary | ICD-10-CM | POA: Diagnosis not present

## 2019-11-05 ENCOUNTER — Other Ambulatory Visit: Payer: Self-pay | Admitting: Adult Health

## 2019-11-11 DIAGNOSIS — L97122 Non-pressure chronic ulcer of left thigh with fat layer exposed: Secondary | ICD-10-CM | POA: Diagnosis not present

## 2019-11-11 DIAGNOSIS — G8250 Quadriplegia, unspecified: Secondary | ICD-10-CM | POA: Diagnosis not present

## 2019-11-11 DIAGNOSIS — L97112 Non-pressure chronic ulcer of right thigh with fat layer exposed: Secondary | ICD-10-CM | POA: Diagnosis not present

## 2019-11-11 DIAGNOSIS — L89893 Pressure ulcer of other site, stage 3: Secondary | ICD-10-CM | POA: Diagnosis not present

## 2019-11-11 DIAGNOSIS — Z86718 Personal history of other venous thrombosis and embolism: Secondary | ICD-10-CM | POA: Diagnosis not present

## 2019-11-11 DIAGNOSIS — L89524 Pressure ulcer of left ankle, stage 4: Secondary | ICD-10-CM | POA: Diagnosis not present

## 2019-11-11 LAB — POCT INR: INR: 2.9 — AB (ref 0.9–1.1)

## 2019-11-12 ENCOUNTER — Telehealth: Payer: Self-pay

## 2019-11-12 DIAGNOSIS — L89612 Pressure ulcer of right heel, stage 2: Secondary | ICD-10-CM | POA: Diagnosis not present

## 2019-11-12 NOTE — Telephone Encounter (Signed)
INR results entered into system.  MyChart message sent to pt to continue current dosage of coumadin.  [name], CMA

## 2019-11-13 DIAGNOSIS — L89612 Pressure ulcer of right heel, stage 2: Secondary | ICD-10-CM | POA: Diagnosis not present

## 2019-11-13 DIAGNOSIS — N319 Neuromuscular dysfunction of bladder, unspecified: Secondary | ICD-10-CM | POA: Diagnosis not present

## 2019-11-13 DIAGNOSIS — R32 Unspecified urinary incontinence: Secondary | ICD-10-CM | POA: Diagnosis not present

## 2019-11-27 LAB — POCT INR: INR: 2.8 — AB (ref 0.9–1.1)

## 2019-12-01 DIAGNOSIS — I96 Gangrene, not elsewhere classified: Secondary | ICD-10-CM | POA: Diagnosis not present

## 2019-12-01 DIAGNOSIS — L89893 Pressure ulcer of other site, stage 3: Secondary | ICD-10-CM | POA: Diagnosis not present

## 2019-12-01 DIAGNOSIS — L97122 Non-pressure chronic ulcer of left thigh with fat layer exposed: Secondary | ICD-10-CM | POA: Diagnosis not present

## 2019-12-01 DIAGNOSIS — L89524 Pressure ulcer of left ankle, stage 4: Secondary | ICD-10-CM | POA: Diagnosis not present

## 2019-12-01 DIAGNOSIS — L97112 Non-pressure chronic ulcer of right thigh with fat layer exposed: Secondary | ICD-10-CM | POA: Diagnosis not present

## 2019-12-01 DIAGNOSIS — Z4801 Encounter for change or removal of surgical wound dressing: Secondary | ICD-10-CM | POA: Diagnosis not present

## 2019-12-01 DIAGNOSIS — G8250 Quadriplegia, unspecified: Secondary | ICD-10-CM | POA: Diagnosis not present

## 2019-12-01 DIAGNOSIS — L89612 Pressure ulcer of right heel, stage 2: Secondary | ICD-10-CM | POA: Diagnosis not present

## 2019-12-03 ENCOUNTER — Telehealth: Payer: Self-pay

## 2019-12-03 MED ORDER — OLOPATADINE HCL 0.1 % OP SOLN: OPHTHALMIC | 0 refills | Status: DC

## 2019-12-03 NOTE — Telephone Encounter (Signed)
Called pt and advised her that [name] has reviewed her INR results from 11/27/2019.  Advised pt to continue current dosage of Coumadin.  Pt expressed understanding and is agreeable.  Pt also requested refill of Patanol.  Pt stated that she previously was getting this medication from [facility], however they want her to get 90 day supply at a time.  Pt states that she cannot afford to get this medication at a time and she requests a one month supply be sent to [facility].  Refill sent to pharmacy.  [name], CMA

## 2019-12-09 LAB — POCT INR: INR: 2.5 — AB (ref 0.9–1.1)

## 2019-12-11 NOTE — Progress Notes (Unsigned)
Pt informed to continue current dose of Coumadin.  Pt expressed understanding and is agreeable.  [name], CMA

## 2019-12-13 LAB — POCT INR: INR: 1.9 — AB (ref <=1.1)

## 2019-12-19 ENCOUNTER — Other Ambulatory Visit: Payer: Self-pay

## 2019-12-19 ENCOUNTER — Telehealth: Payer: Self-pay

## 2019-12-19 NOTE — Telephone Encounter (Signed)
Duplicate encounter.  [name], CMA

## 2019-12-19 NOTE — Telephone Encounter (Signed)
Pt called stating that a provider with home health [facility]) says that she has a cellulitis.  Symptoms include:  Oozing a yellowish to clear drainage from bilateral legs with bleeding, area is warm to touch and she has "raw skin".  Pt denies any fevers.  Pt states that she called the wound care center but they could not see her until 12/25/2019.  I spoke with April at [facility] who states that they did offer the pt an appt today, but pt stated that she couldn't get there today and scheduled an appt for 12/25/2019.  April states that they instructed her to call ID.  Advised pt that this is a situation that may require antibiotics and special dressings and that she will need to call ID for possible further treatment as we do not have the appropriate materials in our office to handle this type of problem.  Pt expressed understanding and is agreeable.  [name], CMA

## 2019-12-22 LAB — POCT INR: INR: 2.5 — AB (ref 0.9–1.1)

## 2019-12-24 ENCOUNTER — Other Ambulatory Visit: Payer: Self-pay

## 2019-12-24 NOTE — Telephone Encounter (Signed)
Called pt and advised her no changes to her Coumadin dosage.  Pt also requested refill of diazepam since she has changed pharmacies.  Pt expressed understanding of Coumadin dosing and refill request sent to [name], NP.  [name], CMA

## 2019-12-25 MED ORDER — DIAZEPAM 5 MG PO TABS: 5.0000 mg | ORAL_TABLET | Freq: Two times a day (BID) | ORAL | 0 refills | Status: DC | PRN

## 2019-12-26 ENCOUNTER — Telehealth: Payer: Self-pay | Admitting: Adult Health

## 2019-12-26 NOTE — Telephone Encounter (Signed)
Pt no longer uses mail order pharmacy.  Sig stated temporary RX until mail order supply arrives.  Please advise.  [name], CMA

## 2019-12-26 NOTE — Telephone Encounter (Signed)
Patient called to question why only 14 pills allowed???  --- Forwarding message to med recs to contact pt w/reason.  --glh

## 2019-12-26 NOTE — Telephone Encounter (Signed)
Good Morning [name], I will send in full Rx Monday. Thanks! [name]

## 2020-01-01 ENCOUNTER — Other Ambulatory Visit: Payer: Self-pay | Admitting: Adult Health

## 2020-01-05 LAB — POCT INR: INR: 2 — AB (ref 0.9–1.1)

## 2020-01-06 ENCOUNTER — Other Ambulatory Visit: Payer: Self-pay | Admitting: Adult Health

## 2020-01-06 MED ORDER — DIAZEPAM 5 MG PO TABS: 5.0000 mg | ORAL_TABLET | Freq: Two times a day (BID) | ORAL | 0 refills | Status: DC | PRN

## 2020-01-06 NOTE — Telephone Encounter (Signed)
Pt states that she cannot get the diazepam thru her mail order pharmacy because they will not fill the medication for a 90 day supply.  [facility] now.  [facility] was only 14 tablets.  Pt states that she did not pick up this prescription and is requesting a 90 day supply [facility].  [name], CMA

## 2020-01-06 NOTE — Telephone Encounter (Signed)
Patient is requesting to speak to [facility] about diazepam prescription that was sent [facility] her [facility].

## 2020-01-06 NOTE — Addendum Note (Signed)
Addended by: Fonnie Mu on: 01/06/2020 10:26 AM   Modules accepted: Orders

## 2020-01-06 NOTE — Telephone Encounter (Signed)
Valium 30 day supply printed and faxed to local [facility]- unable to send via E Rx Thanks! [name]

## 2020-01-06 NOTE — Progress Notes (Signed)
Unable to send Diazepam 5mg  BID- 60 count via e [facility] and signed Faxed to [facility]

## 2020-01-19 ENCOUNTER — Encounter: Payer: Self-pay | Admitting: Adult Health

## 2020-01-19 LAB — POCT INR: INR: 2.2 (ref 2.0–3.0)

## 2020-02-02 LAB — POCT INR: INR: 2.1 (ref 2.0–3.0)

## 2020-02-10 ENCOUNTER — Other Ambulatory Visit: Payer: Self-pay | Admitting: Adult Health

## 2020-02-13 DIAGNOSIS — R339 Retention of urine, unspecified: Secondary | ICD-10-CM | POA: Diagnosis not present

## 2020-02-20 DIAGNOSIS — L89612 Pressure ulcer of right heel, stage 2: Secondary | ICD-10-CM | POA: Diagnosis not present

## 2020-02-23 DIAGNOSIS — L89612 Pressure ulcer of right heel, stage 2: Secondary | ICD-10-CM | POA: Diagnosis not present

## 2020-03-04 ENCOUNTER — Other Ambulatory Visit: Payer: Self-pay | Admitting: Adult Health

## 2020-03-04 NOTE — Telephone Encounter (Signed)
Pt has appt 03/15/2020.  Please review refill request and authorize if appropriate.  [name], CMA

## 2020-03-04 NOTE — Telephone Encounter (Signed)
Does pt have a drug contract with [name]?   Also clinic policy is no more than 2 RF's of benzo's in 12 mon period- thus if thiis is not the case, then NO RF and we can discuss at [place].

## 2020-03-06 NOTE — Telephone Encounter (Signed)
Per last documented quick note to you on this pt:   "Does pt have a drug contract with [name]?   Also clinic policy is no more than 2 RF's of benzo's in 12 mon period- thus if thiis is not the case, then NO RF and we can discuss at [place]."  However, you sent this back to me w/o any documentation in regards to my above Q's.  Does she have a drug contract with [place] and also, how many RF's has pt had in past 12 mo period?   Thnx.

## 2020-03-08 DIAGNOSIS — L97122 Non-pressure chronic ulcer of left thigh with fat layer exposed: Secondary | ICD-10-CM | POA: Diagnosis not present

## 2020-03-08 DIAGNOSIS — L89524 Pressure ulcer of left ankle, stage 4: Secondary | ICD-10-CM | POA: Diagnosis not present

## 2020-03-08 DIAGNOSIS — L89892 Pressure ulcer of other site, stage 2: Secondary | ICD-10-CM | POA: Diagnosis not present

## 2020-03-08 DIAGNOSIS — L89893 Pressure ulcer of other site, stage 3: Secondary | ICD-10-CM | POA: Diagnosis not present

## 2020-03-08 DIAGNOSIS — G8250 Quadriplegia, unspecified: Secondary | ICD-10-CM | POA: Diagnosis not present

## 2020-03-08 DIAGNOSIS — L97112 Non-pressure chronic ulcer of right thigh with fat layer exposed: Secondary | ICD-10-CM | POA: Diagnosis not present

## 2020-03-08 NOTE — Telephone Encounter (Signed)
Pt does have a controlled substance contract on file for #60 tablets every 30 days.  [name], CMA

## 2020-03-08 NOTE — Telephone Encounter (Signed)
Patient was told back in July 2020 to follow-up in 6 months which would be 01/01/2020.    Patient has never followed up as instructed by [facility].    These are high risk medications and I am not comfortable prescribing these to patient due to lack of proper monitoring.   She will need to make a follow-up visit with the new provider, (not me) so she can discuss these meds and her use of them with her new PCP to ensure they are comfortable prescribing them.

## 2020-03-09 LAB — POCT INR: INR: 2.3 — AB (ref 0.9–1.1)

## 2020-03-09 NOTE — Telephone Encounter (Signed)
Please see below message and advise if apt needs to be changed per Dr. Jenetta Downer message. AS, CMA

## 2020-03-09 NOTE — Telephone Encounter (Signed)
Patient is aware of the below and has an apt scheduled for 03/15/2020 at 145pm. AS, CMA

## 2020-03-10 ENCOUNTER — Telehealth: Payer: Self-pay | Admitting: Adult Health

## 2020-03-10 NOTE — Telephone Encounter (Signed)
Patient called with a twofold questions-- She is scheduled to get the [employer] COVID vaccine on Friday @ pharmacy (and wishes provider advice on should she take this (1) vac or should she get the Other (2)Shot) Moderna/Pfizer ?   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  --forwarding request to med asst for review w/ provider & to contact pt w/ decision/ advice @ [phone].  --Dion Body

## 2020-03-10 NOTE — Telephone Encounter (Signed)
Please review below and advise. AS, CMA 

## 2020-03-11 NOTE — Telephone Encounter (Signed)
I can answer that for her, I recommend she follow CDC guidelines and read about the individual vaccines and make that determination on her own.  We know that the majority of Pfizer vaccines do have slightly better coverage than the single vaccines.  Also, she would have to ask the folks administering the vaccine if it was possible to give in the leg.

## 2020-03-11 NOTE — Telephone Encounter (Signed)
Patient is aware of the below and verbalized understanding. AS, CMA 

## 2020-03-11 NOTE — Progress Notes (Signed)
Pt informed to continue same dose of Coumadin.  Pt expressed understanding and is agreeable.  [name], CMA

## 2020-03-15 ENCOUNTER — Other Ambulatory Visit: Payer: Self-pay

## 2020-03-15 ENCOUNTER — Encounter: Payer: Self-pay | Admitting: Family Medicine

## 2020-03-15 ENCOUNTER — Ambulatory Visit (INDEPENDENT_AMBULATORY_CARE_PROVIDER_SITE_OTHER): Payer: Medicare Other | Admitting: Family Medicine

## 2020-03-15 VITALS — BP 95/65 | HR 64 | Temp 97.4°F

## 2020-03-15 DIAGNOSIS — G8250 Quadriplegia, unspecified: Secondary | ICD-10-CM | POA: Diagnosis not present

## 2020-03-15 DIAGNOSIS — E038 Other specified hypothyroidism: Secondary | ICD-10-CM

## 2020-03-15 DIAGNOSIS — Z993 Dependence on wheelchair: Secondary | ICD-10-CM | POA: Diagnosis not present

## 2020-03-15 DIAGNOSIS — G839 Paralytic syndrome, unspecified: Secondary | ICD-10-CM

## 2020-03-15 DIAGNOSIS — E785 Hyperlipidemia, unspecified: Secondary | ICD-10-CM

## 2020-03-15 DIAGNOSIS — I959 Hypotension, unspecified: Secondary | ICD-10-CM
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
## 2020-03-16 ENCOUNTER — Other Ambulatory Visit: Payer: Self-pay | Admitting: Family Medicine

## 2020-03-22 ENCOUNTER — Other Ambulatory Visit: Payer: Self-pay

## 2020-03-22 DIAGNOSIS — G8250 Quadriplegia, unspecified: Secondary | ICD-10-CM

## 2020-03-22 NOTE — Telephone Encounter (Signed)
Pt has a controlled substance contract on file for #60 every 30 days.  Please review refill request and authorize if appropriate.  [name], CMA

## 2020-03-23 LAB — POCT INR: INR: 1.9 — AB (ref 0.9–1.1)

## 2020-03-23 MED ORDER — DIAZEPAM 5 MG PO TABS: 5.0000 mg | ORAL_TABLET | Freq: Two times a day (BID) | ORAL | 0 refills | Status: DC | PRN

## 2020-03-26 NOTE — Progress Notes (Signed)
Pt informed of no change in Coumadin dosage.  [name], CMA

## 2020-03-30 ENCOUNTER — Telehealth: Payer: Self-pay | Admitting: Physician Assistant

## 2020-03-30 NOTE — Telephone Encounter (Signed)
Form placed in [place] for signature.  [name], CMA

## 2020-03-30 NOTE — Telephone Encounter (Signed)
[facility] called to say they have developed a new form for pt's INR  & we need to start using / it has been faxed to [place].  --Form received & being forwarded to med asst & provider.  --glh

## 2020-03-30 NOTE — Telephone Encounter (Signed)
Forgot to add   INR rep   / Jose's phone [phone] --he states med asst/ provider can call him if there are any questions/ concerns.  --glh

## 2020-04-05 DIAGNOSIS — L89524 Pressure ulcer of left ankle, stage 4: Secondary | ICD-10-CM | POA: Diagnosis not present

## 2020-04-05 DIAGNOSIS — L89893 Pressure ulcer of other site, stage 3: Secondary | ICD-10-CM | POA: Diagnosis not present

## 2020-04-05 DIAGNOSIS — G8250 Quadriplegia, unspecified: Secondary | ICD-10-CM | POA: Diagnosis not present

## 2020-04-05 DIAGNOSIS — L97122 Non-pressure chronic ulcer of left thigh with fat layer exposed: Secondary | ICD-10-CM | POA: Diagnosis not present

## 2020-04-05 DIAGNOSIS — L97112 Non-pressure chronic ulcer of right thigh with fat layer exposed: Secondary | ICD-10-CM | POA: Diagnosis not present

## 2020-04-05 DIAGNOSIS — L89892 Pressure ulcer of other site, stage 2: Secondary | ICD-10-CM | POA: Diagnosis not present

## 2020-04-05 LAB — POCT INR: INR: 2.1 — AB (ref 0.9–1.1)

## 2020-04-06 ENCOUNTER — Telehealth: Payer: Self-pay

## 2020-04-06 NOTE — Telephone Encounter (Signed)
Pt informed to continue same dose of Coumadin, per [name], PA-C.  [name], CMA

## 2020-04-15 DIAGNOSIS — L89612 Pressure ulcer of right heel, stage 2: Secondary | ICD-10-CM | POA: Diagnosis not present

## 2020-04-19 LAB — POCT INR: INR: 1.9 — AB (ref 0.9–1.1)

## 2020-04-22 ENCOUNTER — Other Ambulatory Visit: Payer: Self-pay

## 2020-04-22 MED ORDER — BACLOFEN 20 MG PO TABS: ORAL_TABLET | ORAL | 0 refills | Status: DC

## 2020-05-17 ENCOUNTER — Other Ambulatory Visit: Payer: Self-pay | Admitting: Family Medicine

## 2020-05-18 ENCOUNTER — Other Ambulatory Visit: Payer: Self-pay | Admitting: General Practice

## 2020-05-18 NOTE — Telephone Encounter (Signed)
Patient states pharmacy sent refill request for these med, no response :   Pending Medication Requests    Potassium Chloride Crys ER 10 MEQ TAKE 1 TABLET BY MOUTH 3 TIMES DAILY   Bumetanide 1 MG TAKE 1 TABLET BY MOUTH DAILY   Oxybutynin Chloride 5 MG TAKE 1 TABLET BY MOUTH TWICE DAILY &   Says no provider response to  INS elevation either-(advised no INR reading report since 5/17) she state they sent 6/7 or 6/8 (advised our copy/fax machine broke for 2 dys pls have it resubmitted)  --forwarding refill request to med asst to send order to :  [place], [name], [address]  [address] [place], [place], [place] [address]  Phone:  [phone] Fax:  [phone]   --Please contact pt if there are any questions or concerns.  -glh

## 2020-05-19 MED ORDER — OXYBUTYNIN CHLORIDE 5 MG PO TABS: 5.0000 mg | ORAL_TABLET | Freq: Two times a day (BID) | ORAL | 0 refills | Status: DC

## 2020-05-19 MED ORDER — BUMETANIDE 1 MG PO TABS: 1.0000 mg | ORAL_TABLET | Freq: Every day | ORAL | 0 refills | Status: DC

## 2020-05-19 MED ORDER — POTASSIUM CHLORIDE CRYS ER 10 MEQ PO TBCR: 10.0000 meq | EXTENDED_RELEASE_TABLET | Freq: Three times a day (TID) | ORAL | 0 refills | Status: DC

## 2020-05-19 NOTE — Telephone Encounter (Signed)
INR at 1.6-per Herb Grays continue current dose.   Medication sent to pharmacy as requested.      Patient is aware of the above. AS, CMA

## 2020-05-27 ENCOUNTER — Telehealth: Payer: Self-pay | Admitting: Physician Assistant

## 2020-05-27 NOTE — Telephone Encounter (Signed)
Patient INR level is 2.4-per Herb Grays continue current medication regimen.   Patient is aware of this and verbalized understanding.    Lab results to be scanned. AS, CMA

## 2020-05-31 ENCOUNTER — Other Ambulatory Visit: Payer: Self-pay | Admitting: Physician Assistant

## 2020-05-31 DIAGNOSIS — G8250 Quadriplegia, unspecified: Secondary | ICD-10-CM

## 2020-05-31 NOTE — Telephone Encounter (Signed)
Patient last seen 03/30/20 and advised to follow up in 3 months. Patient next apt 7/21.   Last refill given 03/23/20 # 60 no refills  Please approve refill if deemed appropriate.

## 2020-06-07 ENCOUNTER — Other Ambulatory Visit: Payer: Self-pay | Admitting: Family Medicine

## 2020-06-15 ENCOUNTER — Telehealth: Payer: Self-pay

## 2020-06-15 NOTE — Telephone Encounter (Signed)
Continue current coumadin regimen- 4 mg on MWF, 2 mg on T, TH, Sat and Sun.  Thank you, Herb Grays

## 2020-06-15 NOTE — Telephone Encounter (Signed)
No answer.  [name], CMA

## 2020-06-15 NOTE — Telephone Encounter (Signed)
Received fax from [facility] for pt's INR for 06/14/2020 of 1.8.  Please advise of Coumadin dosing.  [name], CMA

## 2020-06-16 NOTE — Telephone Encounter (Signed)
06/16/2020 Pt informed.  [name], CMA

## 2020-06-21 ENCOUNTER — Telehealth: Payer: Self-pay | Admitting: Physician Assistant

## 2020-06-21 NOTE — Telephone Encounter (Signed)
PT-INR results 1.8. Per Herb Grays patient advised to continue current coumadin regimen of 4mg  M, W, F and 2mg  T, Th., Sat, Sun.  Patient verbalized understanding. AS< CMA

## 2020-06-28 ENCOUNTER — Encounter: Payer: Self-pay | Admitting: Physician Assistant

## 2020-06-28 ENCOUNTER — Other Ambulatory Visit: Payer: Self-pay

## 2020-06-28 ENCOUNTER — Ambulatory Visit (INDEPENDENT_AMBULATORY_CARE_PROVIDER_SITE_OTHER): Payer: Medicare Other | Admitting: Physician Assistant

## 2020-06-28 VITALS — BP 90/76 | HR 50 | Temp 97.6°F

## 2020-06-28 DIAGNOSIS — E785 Hyperlipidemia, unspecified: Secondary | ICD-10-CM | POA: Diagnosis not present

## 2020-06-28 DIAGNOSIS — Z7901 Long term (current) use of anticoagulants: Secondary | ICD-10-CM

## 2020-06-28 DIAGNOSIS — G839 Paralytic syndrome, unspecified: Secondary | ICD-10-CM

## 2020-06-28 DIAGNOSIS — Z5181 Encounter for therapeutic drug level monitoring: Secondary | ICD-10-CM

## 2020-06-28 DIAGNOSIS — E038 Other specified hypothyroidism: Secondary | ICD-10-CM | POA: Diagnosis not present

## 2020-06-28 DIAGNOSIS — Z993 Dependence on wheelchair: Secondary | ICD-10-CM

## 2020-06-28 DIAGNOSIS — G8250 Quadriplegia, unspecified: Secondary | ICD-10-CM | POA: Diagnosis not present

## 2020-06-28 DIAGNOSIS — I959 Hypotension, unspecified: Secondary | ICD-10-CM | POA: Diagnosis not present

## 2020-06-28 MED ORDER — ALPRAZOLAM 0.5 MG PO TABS: ORAL_TABLET | ORAL | 0 refills | Status: DC

## 2020-06-28 MED ORDER — DIAZEPAM 5 MG PO TABS: 5.0000 mg | ORAL_TABLET | Freq: Two times a day (BID) | ORAL | 0 refills | Status: DC | PRN
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
## 2020-06-29 LAB — LIPID PANEL
Chol/HDL Ratio: 4.3 ratio (ref 0.0–4.4)
Cholesterol, Total: 176 mg/dL (ref 100–199)
HDL: 41 mg/dL (ref >39)
LDL Chol Calc (NIH): 108 mg/dL — ABNORMAL HIGH (ref 0–99)
Triglycerides: 153 mg/dL — ABNORMAL HIGH (ref 0–149)
VLDL Cholesterol Cal: 27 mg/dL (ref 5–40)

## 2020-06-29 LAB — CBC WITH DIFFERENTIAL/PLATELET
Basophils Absolute: 0 10*3/uL (ref 0.0–0.2)
Basos: 0 %
EOS (ABSOLUTE): 0.1 10*3/uL (ref 0.0–0.4)
Eos: 1 %
Hematocrit: 41.6 % (ref 34.0–46.6)
Hemoglobin: 13.4 g/dL (ref 11.1–15.9)
Immature Grans (Abs): 0 10*3/uL (ref 0.0–0.1)
Immature Granulocytes: 0 %
Lymphocytes Absolute: 2.2 10*3/uL (ref 0.7–3.1)
Lymphs: 29 %
MCH: 30.7 pg (ref 26.6–33.0)
MCHC: 32.2 g/dL (ref 31.5–35.7)
MCV: 95 fL (ref 79–97)
Monocytes Absolute: 0.5 10*3/uL (ref 0.1–0.9)
Monocytes: 7 %
Neutrophils Absolute: 4.7 10*3/uL (ref 1.4–7.0)
Neutrophils: 63 %
Platelets: 171 10*3/uL (ref 150–450)
RBC: 4.37 x10E6/uL (ref 3.77–5.28)
RDW: 13.1 % (ref 11.7–15.4)
WBC: 7.5 10*3/uL (ref 3.4–10.8)

## 2020-06-29 LAB — COMPREHENSIVE METABOLIC PANEL
ALT: 19 IU/L (ref 0–32)
AST: 21 IU/L (ref 0–40)
Albumin/Globulin Ratio: 1.2 (ref 1.2–2.2)
Albumin: 3.6 g/dL — ABNORMAL LOW (ref 3.8–4.8)
Alkaline Phosphatase: 110 IU/L (ref 48–121)
BUN/Creatinine Ratio: 31 — ABNORMAL HIGH (ref 12–28)
BUN: 13 mg/dL (ref 8–27)
Bilirubin Total: 0.4 mg/dL (ref 0.0–1.2)
CO2: 27 mmol/L (ref 20–29)
Calcium: 9.1 mg/dL (ref 8.7–10.3)
Chloride: 102 mmol/L (ref 96–106)
Creatinine, Ser: 0.42 mg/dL — ABNORMAL LOW (ref 0.57–1.00)
GFR calc Af Amer: 124 mL/min/{1.73_m2} (ref >59)
GFR calc non Af Amer: 108 mL/min/{1.73_m2} (ref >59)
Globulin, Total: 3.1 g/dL (ref 1.5–4.5)
Glucose: 84 mg/dL (ref 65–99)
Potassium: 3.5 mmol/L (ref 3.5–5.2)
Sodium: 142 mmol/L (ref 134–144)
Total Protein: 6.7 g/dL (ref 6.0–8.5)

## 2020-06-29 LAB — PROTIME-INR
INR: 1.8 — ABNORMAL HIGH (ref 0.9–1.2)
Prothrombin Time: 18.5 s — ABNORMAL HIGH (ref 9.1–12.0)

## 2020-06-29 LAB — T4, FREE: Free T4: 1.39 ng/dL (ref 0.82–1.77)

## 2020-06-29 LAB — TSH: TSH: 3.56 u[IU]/mL (ref 0.450–4.500)

## 2020-07-01 ENCOUNTER — Telehealth: Payer: Self-pay | Admitting: Physician Assistant

## 2020-07-01 NOTE — Telephone Encounter (Signed)
Patient is aware [name] is going to discuss with Dr. Charise Carwin in regards to changing medication. AS, CMA

## 2020-07-01 NOTE — Telephone Encounter (Signed)
Patient called states that she only has a few days of coumadin left and she is wondering if she needs to refill.   She states at last OV you discussed changing the medication. She would like to know what the plan is. She orders her medication via mail order and it will take some time to get to her.    AS, CMA

## 2020-07-05 ENCOUNTER — Other Ambulatory Visit: Payer: Self-pay | Admitting: Physician Assistant

## 2020-07-06 ENCOUNTER — Telehealth: Payer: Self-pay | Admitting: Physician Assistant

## 2020-07-06 MED ORDER — WARFARIN SODIUM 4 MG PO TABS: ORAL_TABLET | ORAL | 0 refills | Status: DC

## 2020-07-06 MED ORDER — WARFARIN SODIUM 2 MG PO TABS: ORAL_TABLET | ORAL | 0 refills | Status: DC

## 2020-07-06 NOTE — Telephone Encounter (Signed)
Refill sent to requested pharmacy. AS, CMA 

## 2020-07-06 NOTE — Addendum Note (Signed)
Addended by: Mickel Crow on: 07/06/2020 10:25 AM   Modules accepted: Orders

## 2020-07-06 NOTE — Telephone Encounter (Signed)
Patient is requesting a refill of her warfarin, if approved please send to [facility].

## 2020-08-03 ENCOUNTER — Telehealth: Payer: Self-pay | Admitting: Physician Assistant

## 2020-08-03 NOTE — Telephone Encounter (Signed)
Patient notified INR 1.8 and per Herb Grays to continue current medication regimen. Patient verbalized understanding. AS< CMA

## 2020-08-08 ENCOUNTER — Other Ambulatory Visit: Payer: Self-pay | Admitting: Physician Assistant

## 2020-08-23 ENCOUNTER — Telehealth: Payer: Self-pay | Admitting: Physician Assistant

## 2020-08-23 DIAGNOSIS — B351 Tinea unguium: Secondary | ICD-10-CM | POA: Diagnosis not present

## 2020-08-23 DIAGNOSIS — M2041 Other hammer toe(s) (acquired), right foot: Secondary | ICD-10-CM | POA: Diagnosis not present

## 2020-08-23 NOTE — Telephone Encounter (Signed)
Patient is aware to continue current medication regimen for Coumadin. AS, CMA

## 2020-08-27 ENCOUNTER — Telehealth: Payer: Self-pay | Admitting: Physician Assistant

## 2020-08-27 NOTE — Telephone Encounter (Signed)
Patient is aware INR is 1.9 and to continue current medication management. Patient verbalized understanding. AS, CMA

## 2020-09-03 ENCOUNTER — Other Ambulatory Visit: Payer: Self-pay | Admitting: Internal Medicine

## 2020-09-06 ENCOUNTER — Ambulatory Visit: Payer: Medicare Other

## 2020-09-06 ENCOUNTER — Telehealth: Payer: Self-pay | Admitting: Physician Assistant

## 2020-09-06 MED ORDER — PRAVASTATIN SODIUM 40 MG PO TABS
40.0000 mg | ORAL_TABLET | Freq: Every day | ORAL | 0 refills | Status: DC
Start: 2020-09-06 — End: 2020-11-08

## 2020-09-06 MED ORDER — LEVOTHYROXINE SODIUM 50 MCG PO TABS: 50.0000 ug | ORAL_TABLET | Freq: Every day | ORAL | 0 refills | Status: DC

## 2020-09-06 MED ORDER — BUMETANIDE 1 MG PO TABS
1.0000 mg | ORAL_TABLET | Freq: Every day | ORAL | 0 refills | Status: DC
Start: 2020-09-06 — End: 2020-11-08

## 2020-09-06 MED ORDER — POTASSIUM CHLORIDE CRYS ER 10 MEQ PO TBCR: 10.0000 meq | EXTENDED_RELEASE_TABLET | Freq: Three times a day (TID) | ORAL | 0 refills | Status: DC

## 2020-09-06 MED ORDER — WARFARIN SODIUM 4 MG PO TABS: ORAL_TABLET | ORAL | 0 refills | Status: DC

## 2020-09-06 MED ORDER — OXYBUTYNIN CHLORIDE 5 MG PO TABS: 5.0000 mg | ORAL_TABLET | Freq: Two times a day (BID) | ORAL | 0 refills | Status: DC

## 2020-09-06 MED ORDER — WARFARIN SODIUM 2 MG PO TABS: ORAL_TABLET | ORAL | 0 refills | Status: DC

## 2020-09-06 NOTE — Addendum Note (Signed)
Addended by: Mickel Crow on: 09/06/2020 03:35 PM   Modules accepted: Orders

## 2020-09-06 NOTE — Telephone Encounter (Signed)
Refills sent to requested pharamcy. AS, CMA

## 2020-09-06 NOTE — Telephone Encounter (Signed)
Patient's pharmacy called requesting refills on pravastatin, bumetanide, levothyroxine, oxybutynin, warfarin 2mg , warfarin 4mg , potassium chloride. Please send to [facility]. Patient would like 90 day supplies of each.

## 2020-09-13 ENCOUNTER — Ambulatory Visit: Payer: Medicare Other

## 2020-09-16 ENCOUNTER — Telehealth: Payer: Self-pay | Admitting: Physician Assistant

## 2020-09-16 NOTE — Telephone Encounter (Signed)
Patient is aware INR 2.0 and per Herb Grays to continue current medication regimen. AS, CMA

## 2020-09-23 LAB — PROTIME-INR: INR: 1.9 — AB (ref 0.9–1.1)

## 2020-09-24 LAB — POCT INR: INR: 2.5 — AB (ref <=1.1)

## 2020-09-27 ENCOUNTER — Ambulatory Visit (INDEPENDENT_AMBULATORY_CARE_PROVIDER_SITE_OTHER): Payer: Medicare Other | Admitting: Physician Assistant

## 2020-09-27 ENCOUNTER — Other Ambulatory Visit: Payer: Self-pay

## 2020-09-27 ENCOUNTER — Encounter: Payer: Self-pay | Admitting: Physician Assistant

## 2020-09-27 DIAGNOSIS — G8250 Quadriplegia, unspecified: Secondary | ICD-10-CM

## 2020-09-27 DIAGNOSIS — R292 Abnormal reflex: Secondary | ICD-10-CM

## 2020-09-27 DIAGNOSIS — Z23 Encounter for immunization: Secondary | ICD-10-CM

## 2020-09-27 DIAGNOSIS — N319 Neuromuscular dysfunction of bladder, unspecified: Secondary | ICD-10-CM

## 2020-09-27 DIAGNOSIS — G839 Paralytic syndrome, unspecified: Secondary | ICD-10-CM | POA: Diagnosis not present

## 2020-09-27 DIAGNOSIS — E785 Hyperlipidemia, unspecified: Secondary | ICD-10-CM

## 2020-09-27 DIAGNOSIS — I959 Hypotension, unspecified: Secondary | ICD-10-CM | POA: Diagnosis not present

## 2020-09-27 DIAGNOSIS — R1900 Intra-abdominal and pelvic swelling, mass and lump, unspecified site: Secondary | ICD-10-CM

## 2020-09-27 DIAGNOSIS — Z5181 Encounter for therapeutic drug level monitoring: Secondary | ICD-10-CM

## 2020-09-27 DIAGNOSIS — E038 Other specified hypothyroidism: Secondary | ICD-10-CM

## 2020-09-27 DIAGNOSIS — Z7901 Long term (current) use of anticoagulants: Secondary | ICD-10-CM

## 2020-09-27 MED ORDER — DIAZEPAM 5 MG PO TABS: 5.0000 mg | ORAL_TABLET | Freq: Two times a day (BID) | ORAL | 0 refills | Status: DC | PRN

## 2020-09-27 MED ORDER — ACETAMINOPHEN-CODEINE #3 300-30 MG PO TABS: 1.0000 | ORAL_TABLET | ORAL | 0 refills | Status: DC | PRN

## 2020-09-27 NOTE — Assessment & Plan Note (Addendum)
-  Last lipid panel: triglycerides and LDL mildly elevated -Continue current medication regimen. -Recommend to follow a heart healthy diet low in saturated and trans fats. -Will continue to monitor.
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
## 2020-09-27 NOTE — Assessment & Plan Note (Signed)
-  Last TSH wnl -Continue current medication regimen. -Will continue to monitor.  

## 2020-09-27 NOTE — Assessment & Plan Note (Signed)
-  Asymptomatic -BP today 84/59, HR 62 -Will continue to monitor.

## 2020-09-27 NOTE — Patient Instructions (Signed)
Bleeding Precautions When on Anticoagulant Therapy, Adult Anticoagulant therapy, also called blood thinner therapy, is medicine that helps to prevent and treat blood clots. The medicine works by stopping blood clots from forming or growing. Blood clots that form in your blood vessels can be dangerous. They can break loose and travel to the heart, lungs, or brain. This increases the risk of a heart attack, stroke, or blocked lung artery (pulmonary embolism). Anticoagulants also increase the risk of bleeding. Try to protect yourself from cuts and other injuries that can cause bleeding. It is important to take anticoagulants exactly as told by your health care provider. Why do I need to be on anticoagulant therapy? You may need this medicine if you are at risk of developing a blood clot. Conditions that increase your risk of a blood clot include:  Being born with heart disease or a heart malformation (congenital heart disease).  Developing heart disease.  Having had surgery, such as valve replacement.  Having had a serious accident or other type of severe injury (trauma).  Having certain types of cancer.  Having certain diseases that can increase blood clotting.  Having a high risk of stroke or heart attack.  Having atrial fibrillation (AF). What are the common anticoagulant medicines? There are several types of anticoagulant medicines. The most common types are:  Medicines that you take by mouth (oral medicines), such as: ? Warfarin. ? Novel oral anticoagulants (NOACs), such as:  Direct thrombin inhibitors (dabigatran).  Factor Xa inhibitors (apixaban, edoxaban, and rivaroxaban).  Injections, such as: ? Unfractionated heparin. ? Low molecular weight heparin. These anticoagulants work in different ways to prevent blood clots. They also have different risks and side effects. What do I need to remember while on anticoagulant therapy? Taking anticoagulants  Take your medicine at the  same time every day. If you forget to take your medicine, take it as soon as you remember. Do not double your dosage of medicine if you miss a whole day. Take your normal dose and call your health care provider.  Do not stop taking your medicine unless your health care provider approves. Stopping the medicine can increase your risk of developing a blood clot. Taking other medicines  Take over-the-counter and prescriptions medicines only as told by your health care provider.  Do not take over-the-counter NSAIDs, including aspirin and ibuprofen, while you are on anticoagulant therapy. These medicines increase your risk of dangerous bleeding.  Get approval from your health care provider before you start taking any new medicines, vitamins, or herbal products. Some of these could interfere with your therapy. General instructions  Keep all follow-up visits as told by your health care provider. This is important.  If you are pregnant or trying to get pregnant, talk with a health care provider about anticoagulants. Some of these medicines are not safe to take during pregnancy.  Tell all health care providers, including your dentist, that you are on anticoagulant therapy. It is especially important to tell providers before you have any surgery, medical procedures, or dental work done. What precautions should I take?   Be very careful when using knives, scissors, or other sharp objects.  Use an electric razor instead of a blade.  Do not use toothpicks.  Use a soft-bristled toothbrush. Brush your teeth gently.  Always wear shoes outdoors and wear slippers indoors.  Be careful when cutting your fingernails and toenails.  Place bath mats in the bathroom. If possible, install handrails as well.  Wear gloves while you do  yard work.  Wear your seat belt.  Prevent falls by removing loose rugs and extension cords from areas where you walk. Use a cane or walker if you need it.  Avoid  constipation by: ? Drinking enough fluid to keep your urine clear or pale yellow. ? Eating foods that are high in fiber, such as fresh fruits and vegetables, whole grains, and beans. ? Limiting foods that are high in fat and processed sugars, such as fried and sweet foods.  Do not play contact sports or participate in other activities that have a high risk for injury. What other precautions are important if on warfarin therapy? If you are taking a type of anticoagulant called warfarin, make sure you:  Work with a diet and nutrition specialist (dietitian) to make an eating plan. Do not make any sudden changes to your diet after you have started your eating plan.  Do not drink alcohol. It can interfere with your medicine and increase your risk of an injury that causes bleeding.  Get regular blood tests as told by your health care provider. What are some questions to ask my health care provider?  Why do I need anticoagulant therapy?  What is the best anticoagulant therapy for my condition?  How long will I need anticoagulant therapy?  What are the side effects of anticoagulant therapy?  When should I take my medicine? What should I do if I forget to take it?  Will I need to have regular blood tests?  Do I need to change my diet? Are there foods or drinks that I should avoid?  What activities are safe for me?  What should I do if I want to get pregnant? Contact a health care provider if:  You miss a dose of medicine: ? And you are not sure what to do. ? For more than one day.  You have: ? Menstrual bleeding that is heavier than normal. ? Bloody or brown urine. ? Easy bruising. ? Black and tarry stool or bright red stool. ? Side effects from your medicine.  You feel weak or dizzy.  You become pregnant. Get help right away if:  You have bleeding that will not stop within 20 minutes from: ? The nose. ? The gums. ? A cut on the skin.  You have a severe headache or  stomachache.  You vomit or cough up blood.  You fall or hit your head. Summary  Anticoagulant therapy, also called blood thinner therapy, is medicine that helps to prevent and treat blood clots.  Anticoagulants work in different ways to prevent blood clots. They also have different risks and side effects.  Talk with your health care provider about any precautions that you should take while on anticoagulant therapy. This information is not intended to replace advice given to you by your health care provider. Make sure you discuss any questions you have with your health care provider. Document Revised: 03/12/2019 Document Reviewed: 02/06/2017 Elsevier Patient Education  [place].

## 2020-10-05 ENCOUNTER — Telehealth: Payer: Self-pay | Admitting: Physician Assistant

## 2020-10-05 NOTE — Telephone Encounter (Signed)
Patient notified INR is 1.9 and to continue current med regimen per [facility]. Patient verbalized understanding. AS, CMA

## 2020-10-06 LAB — PROTIME-INR: INR: 2.1 — AB (ref 0.9–1.1)

## 2020-10-11 ENCOUNTER — Telehealth: Payer: Self-pay | Admitting: Physician Assistant

## 2020-10-11 NOTE — Telephone Encounter (Signed)
Patient is aware INR 2.1 and patient has been advised to continue current med regimen. Patient verbalized understanding. AS, CMA   Patient also requesting refill of Tylenol 3. Patient is aware that medication refill was sent to [facility] 09/27/20 and we have confirmed receipt that they received medication refill request. Patient going to call [facility]. AS, CMA

## 2020-10-25 DIAGNOSIS — Z7901 Long term (current) use of anticoagulants: Secondary | ICD-10-CM | POA: Diagnosis not present

## 2020-10-25 DIAGNOSIS — G839 Paralytic syndrome, unspecified: Secondary | ICD-10-CM | POA: Diagnosis not present

## 2020-10-25 LAB — POCT INR: INR: 2 (ref 2.0–3.0)

## 2020-11-01 ENCOUNTER — Telehealth: Payer: Self-pay | Admitting: Physician Assistant

## 2020-11-01 NOTE — Telephone Encounter (Signed)
Patient INR is 2.0-per Herb Grays patient INR within target and continue current medication regimen.   Patient is aware of these instructions and verbalized understanding. AS, CMA

## 2020-11-07 ENCOUNTER — Other Ambulatory Visit: Payer: Self-pay | Admitting: Physician Assistant

## 2020-11-09 LAB — POCT INR: INR: 2.5 (ref 2.0–3.0)

## 2020-11-15 ENCOUNTER — Telehealth: Payer: Self-pay | Admitting: Physician Assistant

## 2020-11-15 NOTE — Telephone Encounter (Signed)
Patient advised PT-INR at 2.5 and per [facility] continue current med regimen. Patient verbalized understanding. AS, CMA

## 2020-11-18 ENCOUNTER — Other Ambulatory Visit: Payer: Self-pay | Admitting: Physician Assistant

## 2020-11-29 LAB — PROTIME-INR: INR: 2.4 — AB (ref 0.9–1.1)

## 2020-11-30 ENCOUNTER — Other Ambulatory Visit: Payer: Self-pay | Admitting: Physician Assistant

## 2020-12-08 ENCOUNTER — Telehealth: Payer: Self-pay | Admitting: Physician Assistant

## 2020-12-08 NOTE — Telephone Encounter (Signed)
Patient PT/INR 2.4 per [facility] patient is to continue current medication regimen. Patient is aware of this and verbalized understanding. AS, CMA

## 2020-12-16 LAB — PROTIME-INR: INR: 2.2 — AB (ref 0.9–1.1)

## 2020-12-21 ENCOUNTER — Telehealth: Payer: Self-pay | Admitting: Physician Assistant

## 2020-12-21 NOTE — Telephone Encounter (Signed)
Patient advised INR at 2.2 and advised to continue current med regimen. AS, CMA

## 2020-12-28 ENCOUNTER — Encounter: Payer: Self-pay | Admitting: Physician Assistant

## 2020-12-28 ENCOUNTER — Other Ambulatory Visit: Payer: Self-pay

## 2020-12-28 ENCOUNTER — Ambulatory Visit (INDEPENDENT_AMBULATORY_CARE_PROVIDER_SITE_OTHER): Payer: Medicare Other | Admitting: Physician Assistant

## 2020-12-28 ENCOUNTER — Other Ambulatory Visit: Payer: Self-pay | Admitting: Physician Assistant

## 2020-12-28 DIAGNOSIS — E785 Hyperlipidemia, unspecified: Secondary | ICD-10-CM

## 2020-12-28 DIAGNOSIS — G839 Paralytic syndrome, unspecified: Secondary | ICD-10-CM

## 2020-12-28 DIAGNOSIS — G8250 Quadriplegia, unspecified: Secondary | ICD-10-CM | POA: Diagnosis not present

## 2020-12-28 DIAGNOSIS — E038 Other specified hypothyroidism: Secondary | ICD-10-CM

## 2020-12-28 DIAGNOSIS — N319 Neuromuscular dysfunction of bladder, unspecified: Secondary | ICD-10-CM

## 2020-12-28 DIAGNOSIS — G894 Chronic pain syndrome: Secondary | ICD-10-CM | POA: Diagnosis not present

## 2020-12-28 DIAGNOSIS — R1900 Intra-abdominal and pelvic swelling, mass and lump, unspecified site: Secondary | ICD-10-CM | POA: Diagnosis not present

## 2020-12-28 DIAGNOSIS — I959 Hypotension, unspecified: Secondary | ICD-10-CM

## 2020-12-28 DIAGNOSIS — R292 Abnormal reflex: Secondary | ICD-10-CM

## 2020-12-28 NOTE — Telephone Encounter (Signed)
Please approve if deemed appropriate. AS, CMA
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
## 2020-12-30 ENCOUNTER — Other Ambulatory Visit: Payer: Self-pay | Admitting: Physician Assistant

## 2020-12-30 DIAGNOSIS — N319 Neuromuscular dysfunction of bladder, unspecified: Secondary | ICD-10-CM

## 2020-12-30 DIAGNOSIS — G8250 Quadriplegia, unspecified: Secondary | ICD-10-CM

## 2021-01-01 DIAGNOSIS — G839 Paralytic syndrome, unspecified: Secondary | ICD-10-CM | POA: Diagnosis not present

## 2021-01-01 DIAGNOSIS — Z7901 Long term (current) use of anticoagulants: Secondary | ICD-10-CM | POA: Diagnosis not present

## 2021-01-07 ENCOUNTER — Telehealth: Payer: Self-pay | Admitting: Physician Assistant

## 2021-01-07 NOTE — Telephone Encounter (Signed)
Patient states she held her INR 01/01/21 and will recheck 01/10/21. INR was 3.6. Herb Grays is aware. AS, CMA

## 2021-01-11 ENCOUNTER — Telehealth: Payer: Self-pay | Admitting: Physician Assistant

## 2021-01-11 NOTE — Telephone Encounter (Signed)
Patient is aware INR is 2.3 and to continue current medication regimen per [name]. Patient verbalized understanding. AS, CMA

## 2021-01-25 LAB — PROTIME-INR

## 2021-01-27 ENCOUNTER — Telehealth: Payer: Self-pay | Admitting: Physician Assistant

## 2021-01-27 NOTE — Telephone Encounter (Signed)
Patient PT/INR 2.5-per Herb Grays at goal. Continue current med regimen. Attempted to contact pt. Phone disconnected at this time. AS, CMA

## 2021-01-28 NOTE — Telephone Encounter (Signed)
Attempted to contact patient to give INR results. Phone rang, call was answered but nobody spoke. Waited several beats to see if someone was there but no one spoke. AS, CMA

## 2021-01-31 ENCOUNTER — Other Ambulatory Visit: Payer: Self-pay | Admitting: Physician Assistant

## 2021-01-31 MED ORDER — POTASSIUM CHLORIDE CRYS ER 10 MEQ PO TBCR: 10.0000 meq | EXTENDED_RELEASE_TABLET | Freq: Three times a day (TID) | ORAL | 0 refills | Status: DC

## 2021-01-31 NOTE — Telephone Encounter (Signed)
Patient is aware of the results and verbalized understanding. AS, CMA 

## 2021-02-08 ENCOUNTER — Encounter: Payer: Self-pay | Admitting: Physician Assistant

## 2021-02-08 LAB — POCT INR: INR: 2.3 (ref 2.0–3.0)

## 2021-02-09 ENCOUNTER — Telehealth: Payer: Self-pay

## 2021-02-09 NOTE — Telephone Encounter (Signed)
Called pt she is advised of her pti INR 2.3 results. KM rma

## 2021-02-22 ENCOUNTER — Encounter: Payer: Self-pay | Admitting: Physician Assistant

## 2021-02-22 LAB — POCT INR: INR: 2.1 (ref 2.0–3.0)

## 2021-02-23 ENCOUNTER — Telehealth: Payer: Self-pay | Admitting: Physician Assistant

## 2021-02-23 NOTE — Telephone Encounter (Signed)
Patient INR is 2.1 and patient advised to continue current medication regimen. AS, CMA

## 2021-03-02 ENCOUNTER — Other Ambulatory Visit: Payer: Self-pay | Admitting: Physician Assistant

## 2021-03-02 DIAGNOSIS — G8250 Quadriplegia, unspecified: Secondary | ICD-10-CM

## 2021-03-10 ENCOUNTER — Encounter: Payer: Self-pay | Admitting: Physician Assistant

## 2021-03-10 ENCOUNTER — Telehealth: Payer: Self-pay | Admitting: Physician Assistant

## 2021-03-10 LAB — POCT INR: INR: 2.7 (ref 2.0–3.0)

## 2021-03-10 NOTE — Telephone Encounter (Signed)
Patient INR 2.7 per [facility] patient INR @ goal. Continue current medication regimen. Patient is aware and verbalized understanding. AS< CMA

## 2021-03-14 ENCOUNTER — Other Ambulatory Visit: Payer: Self-pay

## 2021-03-14 ENCOUNTER — Encounter: Payer: Self-pay | Admitting: Physician Assistant

## 2021-03-14 ENCOUNTER — Ambulatory Visit (INDEPENDENT_AMBULATORY_CARE_PROVIDER_SITE_OTHER): Payer: Medicare Other | Admitting: Physician Assistant

## 2021-03-14 VITALS — BP 79/54 | HR 61 | Temp 97.1°F

## 2021-03-14 DIAGNOSIS — G8250 Quadriplegia, unspecified: Secondary | ICD-10-CM

## 2021-03-14 DIAGNOSIS — Z5181 Encounter for therapeutic drug level monitoring: Secondary | ICD-10-CM

## 2021-03-14 DIAGNOSIS — Z7901 Long term (current) use of anticoagulants: Secondary | ICD-10-CM

## 2021-03-14 DIAGNOSIS — E785 Hyperlipidemia, unspecified: Secondary | ICD-10-CM | POA: Diagnosis not present

## 2021-03-14 DIAGNOSIS — I959 Hypotension, unspecified: Secondary | ICD-10-CM

## 2021-03-14 DIAGNOSIS — E038 Other specified hypothyroidism: Secondary | ICD-10-CM

## 2021-03-14 DIAGNOSIS — F418 Other specified anxiety disorders: Secondary | ICD-10-CM

## 2021-03-14 NOTE — Patient Instructions (Signed)
Diastasis Recti  Diastasis recti is a condition in which the muscles of the abdomen (rectus abdominis muscles) become thin and separate. The result is a wider space between the muscles of the right and left abdomen (abdominal muscles). This wider space between the muscles may cause a bulge in the middle of the abdomen. This bulge may be noticed when a person is straining or when he or she sits up after lying down. Diastasis recti can affect men and women. It is most common among pregnant women, babies, people with obesity, and people who have had abdominal surgery. Exercise or surgery may help correct this condition. What are the causes? Common causes of this condition include:  Pregnancy. As the uterus grows in size, it puts pressure on the abdominal muscles, causing the muscles to separate.  Obesity. Excess fat puts pressure on abdominal muscles.  Weight lifting.  Some exercises of the abdomen.  Advanced age.  Genetics.  Having had surgery on the abdomen before. What increases the risk? This condition is more likely to develop in:  Women.  Newborns, especially newborns who are born early (prematurely). What are the signs or symptoms? Common symptoms of this condition include:  A bulge in the middle of your abdomen. You will notice it most when you sit up or strain.  Pain in your low back, hips, or the area between your hip bones (pelvis).  Constipation.  Being unable to control when you urinate (urinary incontinence).  Bloating.  Poor posture. How is this diagnosed? This condition is diagnosed with a physical exam. During the exam, your health care provider will ask you to lie flat on your back and do a crunch or half sit-up. If you have diastasis recti, a bulge will appear lengthwise between your abdominal muscles in the center of your abdomen. Your health care provider will measure the gap between your muscles with one of the following:  A medical device used to measure  the space between two objects (caliper).  A tape measure.  CT scan.  Ultrasound.  Finger spaces. Your health care provider will measure the space using his or her fingers. How is this treated? If your muscle separation is not too large, you may not need treatment. However, if you are a woman who plans to become pregnant again, you should treat this condition before your next pregnancy. Treatment may include:  Physical therapy exercises to strengthen and tighten your abdominal muscles.  Lifestyle changes such as weight loss and exercise.  Over-the-counter pain medicines as needed.  Surgery to correct the separation. Follow these instructions at home: Activity  Return to your normal activities as told by your health care provider. Ask your health care provider what activities are safe for you.  Do exercises as told by your health care provider. Make sure you are doing your exercises and movements correctly when lifting weights or doing exercises using your abdominal muscles or the muscles in the center of your body that give stability (core muscles). Proper form can help to prevent this condition from happening again. General instructions  If you are overweight, ask your health care provider for help with weight loss. Losing even a small amount of weight can help to improve your diastasis recti.  Take over-the-counter or prescription medicines only as told by your health care provider.  Do not strain. Straining can make the separation worse. Examples of straining include: ? Pushing hard to have a bowel movement, such as when you have constipation. ? Lifting heavy  objects or lifting children. ? Standing up and sitting down.  You may need to take these actions to prevent or treat constipation: ? Drink enough fluid to keep your urine pale yellow. ? Take over-the-counter or prescription medicines. ? Eat foods that are high in fiber, such as beans, whole grains, and fresh fruits and  vegetables. ? Limit foods that are high in fat and processed sugars, such as fried or sweet foods.  Keep all follow-up visits. This is important. Contact a health care provider if:  You notice a new bulge in your abdomen. Get help right away if:  You experience severe discomfort in your abdomen.  You develop severe abdominal pain along with nausea, vomiting, or a fever. Summary  Diastasis recti is a condition in which the muscles of the abdomen (rectus abdominismuscles) become thin and separate. You may notice a bulge in your abdomen because the space has widened between the muscles of the right and left abdomen.  The most common symptom is a bulge in the middle of your abdomen. You will notice it most when you sit up or strain.  This condition is diagnosed with a physical exam.  If the muscle separation is not too big, you may not need treatment. Otherwise, you may need to do physical therapy or have surgery. This information is not intended to replace advice given to you by your health care provider. Make sure you discuss any questions you have with your health care provider. Document Revised: 07/23/2020 Document Reviewed: 07/23/2020 Elsevier Patient Education  [place].
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
## 2021-03-15 LAB — LIPID PANEL
Chol/HDL Ratio: 4.2 ratio (ref 0.0–4.4)
Cholesterol, Total: 182 mg/dL (ref 100–199)
HDL: 43 mg/dL (ref >39)
LDL Chol Calc (NIH): 119 mg/dL — ABNORMAL HIGH (ref 0–99)
Triglycerides: 110 mg/dL (ref 0–149)
VLDL Cholesterol Cal: 20 mg/dL (ref 5–40)

## 2021-03-15 LAB — HEMOGLOBIN A1C
Est. average glucose Bld gHb Est-mCnc: 97 mg/dL
Hgb A1c MFr Bld: 5 % (ref 4.8–5.6)

## 2021-03-15 LAB — CBC
Hematocrit: 40.3 % (ref 34.0–46.6)
Hemoglobin: 13.3 g/dL (ref 11.1–15.9)
MCH: 31.5 pg (ref 26.6–33.0)
MCHC: 33 g/dL (ref 31.5–35.7)
MCV: 96 fL (ref 79–97)
Platelets: 186 10*3/uL (ref 150–450)
RBC: 4.22 x10E6/uL (ref 3.77–5.28)
RDW: 13.5 % (ref 11.7–15.4)
WBC: 6.7 10*3/uL (ref 3.4–10.8)

## 2021-03-15 LAB — COMPREHENSIVE METABOLIC PANEL
ALT: 20 IU/L (ref 0–32)
AST: 21 IU/L (ref 0–40)
Albumin/Globulin Ratio: 0.9 — ABNORMAL LOW (ref 1.2–2.2)
Albumin: 3.2 g/dL — ABNORMAL LOW (ref 3.8–4.8)
Alkaline Phosphatase: 100 IU/L (ref 44–121)
BUN/Creatinine Ratio: 30 — ABNORMAL HIGH (ref 12–28)
BUN: 14 mg/dL (ref 8–27)
Bilirubin Total: 0.4 mg/dL (ref 0.0–1.2)
CO2: 24 mmol/L (ref 20–29)
Calcium: 9.2 mg/dL (ref 8.7–10.3)
Chloride: 102 mmol/L (ref 96–106)
Creatinine, Ser: 0.46 mg/dL — ABNORMAL LOW (ref 0.57–1.00)
Globulin, Total: 3.7 g/dL (ref 1.5–4.5)
Glucose: 79 mg/dL (ref 65–99)
Potassium: 3.7 mmol/L (ref 3.5–5.2)
Sodium: 142 mmol/L (ref 134–144)
Total Protein: 6.9 g/dL (ref 6.0–8.5)
eGFR: 105 mL/min/{1.73_m2} (ref >59)

## 2021-03-15 LAB — TSH: TSH: 4.15 u[IU]/mL (ref 0.450–4.500)

## 2021-03-23 LAB — POCT INR: INR: 2.3 (ref 2.0–3.0)

## 2021-03-24 ENCOUNTER — Telehealth: Payer: Self-pay | Admitting: Physician Assistant

## 2021-03-24 NOTE — Telephone Encounter (Signed)
Pt INR at 2.3 and per [facility] patient is to continue current medication regimen. Patient is aware and verbalized understanding. AS, CMA

## 2021-04-13 ENCOUNTER — Telehealth: Payer: Self-pay | Admitting: Physician Assistant

## 2021-04-13 NOTE — Telephone Encounter (Signed)
Pt INR 1.9. Per H[facility] App advised patient to take 6mg  of Warfarin today and Friday.   Pt made aware of the above and declines advise stating she ate a lot of Broccoli on mothers day and that is why her level if off. She states she is going to continue the same dose she has been taking and that the level will go back to normal.   Helyn App is aware. AS< CMA

## 2021-04-27 ENCOUNTER — Other Ambulatory Visit: Payer: Self-pay | Admitting: Physician Assistant

## 2021-04-27 DIAGNOSIS — G8250 Quadriplegia, unspecified: Secondary | ICD-10-CM

## 2021-04-27 NOTE — Telephone Encounter (Signed)
Patient last seen 03/14/21 and advised to follow up in 6 months.   Last refill given 03/02/21 # 60

## 2021-05-03 ENCOUNTER — Encounter: Payer: Self-pay | Admitting: Physician Assistant

## 2021-05-03 ENCOUNTER — Telehealth: Payer: Self-pay | Admitting: Physician Assistant

## 2021-05-03 LAB — POCT INR: INR: 2 (ref 2.0–3.0)

## 2021-05-03 NOTE — Telephone Encounter (Signed)
Patient made aware of the results from INR 2.0 and to continue current medication regimen. Patient verbalized understanding. AS, CMA

## 2021-05-22 ENCOUNTER — Encounter: Payer: Self-pay | Admitting: Physician Assistant

## 2021-05-22 LAB — POCT INR: INR: 2.3 (ref 2.0–3.0)

## 2021-05-23 ENCOUNTER — Telehealth: Payer: Self-pay | Admitting: Physician Assistant

## 2021-05-23 ENCOUNTER — Other Ambulatory Visit: Payer: Self-pay | Admitting: Nurse Practitioner

## 2021-05-23 ENCOUNTER — Other Ambulatory Visit: Payer: Self-pay

## 2021-05-23 DIAGNOSIS — N390 Urinary tract infection, site not specified: Secondary | ICD-10-CM

## 2021-05-23 DIAGNOSIS — T83511A Infection and inflammatory reaction due to indwelling urethral catheter, initial encounter: Secondary | ICD-10-CM

## 2021-05-23 MED ORDER — CIPROFLOXACIN HCL 250 MG PO TABS: 250.0000 mg | ORAL_TABLET | Freq: Two times a day (BID) | ORAL | 0 refills | Status: DC

## 2021-05-23 MED ORDER — CIPROFLOXACIN HCL 250 MG PO TABS
250.0000 mg | ORAL_TABLET | Freq: Two times a day (BID) | ORAL | 0 refills | Status: DC
Start: 2021-05-23 — End: 2021-05-23

## 2021-05-23 NOTE — Telephone Encounter (Signed)
Started cipro 250mg  twice daily for next 5 days. This will possibly interfere with warfarin dosing. Please ensure that INR will be checked soon so it can be adjusted if needed. Patient's caregiver to drop off urine specimen tomorrow and we can send for culture and sensitivity. Will adjust antibiotics as indicated per culture and sensitivity results.

## 2021-05-23 NOTE — Telephone Encounter (Signed)
Patient's nurse called in stating her catheter change had a lot of crystals present and it showed positive for white blood cells infection. Patient does not have a fever, and patient is concerned becoming septic without an antibiotic. [phone] is patient's home number and that is the number to call. Patient is homebound.

## 2021-05-23 NOTE — Telephone Encounter (Signed)
Spoke with patient about antibiotic treatment and she gave verbal understanding of plan. Changed prescription location from [facility] to [facility] in [place].

## 2021-05-23 NOTE — Progress Notes (Signed)
Started cipro 250mg  twice daily for next 5 days. This will possibly interfere with warfarin dosing. Please ensure that INR will be checked soon so it can be adjusted if needed. Patient's caregiver to drop off urine specimen tomorrow and we can send for culture and sensitivity. Will adjust antibiotics as indicated per culture and sensitivity results.

## 2021-05-24 ENCOUNTER — Other Ambulatory Visit (INDEPENDENT_AMBULATORY_CARE_PROVIDER_SITE_OTHER): Payer: Medicare Other

## 2021-05-24 DIAGNOSIS — R300 Dysuria: Secondary | ICD-10-CM | POA: Diagnosis not present

## 2021-05-24 LAB — POCT URINALYSIS DIPSTICK
Bilirubin, UA: NEGATIVE
Glucose, UA: NEGATIVE
Ketones, UA: NEGATIVE
Nitrite, UA: NEGATIVE
Protein, UA: POSITIVE — AB
Spec Grav, UA: 1.015 (ref 1.010–1.025)
Urobilinogen, UA: 0.2 E.U./dL
pH, UA: 7 (ref 5.0–8.0)

## 2021-05-30 ENCOUNTER — Telehealth: Payer: Self-pay

## 2021-05-30 LAB — URINE CULTURE

## 2021-05-30 NOTE — Telephone Encounter (Signed)
Called patient to give urine culture results and directions to finish her cipro antibiotic treatment. I also shared with her her INR results and recommended she continue her current medication regimen.

## 2021-05-30 NOTE — Progress Notes (Signed)
Patient was treated with cipro 250mg  twice daily for ten days. Cipro appears to be effective against both bacteria which were cultured from sample.

## 2021-06-03 ENCOUNTER — Other Ambulatory Visit: Payer: Self-pay | Admitting: Nurse Practitioner

## 2021-06-03 ENCOUNTER — Telehealth: Payer: Self-pay | Admitting: Physician Assistant

## 2021-06-03 DIAGNOSIS — T83511A Infection and inflammatory reaction due to indwelling urethral catheter, initial encounter: Secondary | ICD-10-CM

## 2021-06-03 MED ORDER — CIPROFLOXACIN HCL 500 MG PO TABS: 500.0000 mg | ORAL_TABLET | Freq: Two times a day (BID) | ORAL | 0 refills | Status: DC

## 2021-06-03 NOTE — Telephone Encounter (Signed)
I put her on cipro 250mg  twice daily. It was effective medication, however, if she is still having symptoms, if can do the higher dose for a slightly longer period of time.

## 2021-06-03 NOTE — Progress Notes (Signed)
Second round ciprofloxacin ordered. This time at 500mg  twice daiy for 7 days. Patient continues to have symptoms of UTI. Prior urine culture showing two bacteria, both susceptible to cipro. New prescription sent to [facility].

## 2021-06-03 NOTE — Telephone Encounter (Signed)
Patient would like to make sure the medication dosage is correct for the Cipro for her UTI. Please advise, thanks.

## 2021-06-03 NOTE — Telephone Encounter (Signed)
I sent second round ciprofloxacin on Friday. This time at 500mg  twice daiy for 7 days. New prescription sent to [facility]. I put a note on the prescription for the pharmacy to notify when this prescription was ready for her to pick up.

## 2021-06-03 NOTE — Telephone Encounter (Signed)
Patient states that she is still having problems with urinary symptoms. She is requesting a stronger antibiotic be sent to pharmacy. AS, CMA

## 2021-06-05 ENCOUNTER — Other Ambulatory Visit: Payer: Self-pay | Admitting: Physician Assistant

## 2021-06-05 DIAGNOSIS — N319 Neuromuscular dysfunction of bladder, unspecified: Secondary | ICD-10-CM

## 2021-06-05 DIAGNOSIS — G8250 Quadriplegia, unspecified: Secondary | ICD-10-CM

## 2021-06-06 LAB — PROTIME-INR: INR: 2.1 — AB (ref 0.9–1.1)

## 2021-06-07 NOTE — Telephone Encounter (Signed)
Approved #60 tablets and sent to pharmacy.

## 2021-06-09 ENCOUNTER — Telehealth: Payer: Self-pay | Admitting: Physician Assistant

## 2021-06-09 DIAGNOSIS — Z993 Dependence on wheelchair: Secondary | ICD-10-CM

## 2021-06-09 DIAGNOSIS — G8250 Quadriplegia, unspecified: Secondary | ICD-10-CM

## 2021-06-09 DIAGNOSIS — G839 Paralytic syndrome, unspecified: Secondary | ICD-10-CM

## 2021-06-09 NOTE — Telephone Encounter (Signed)
Patient needs new Home Health agency to come out and change her super pubic catheter twice a month. Was previously using [facility].  Patient has [facility] Medicare and needs help locating a home health that will take her insurance. Please Advise!!

## 2021-06-10 ENCOUNTER — Telehealth: Payer: Self-pay | Admitting: Physician Assistant

## 2021-06-10 NOTE — Telephone Encounter (Signed)
Patient is aware of her INR results of 2.1 and is aware to continue current medication regiment. AS, CMA

## 2021-06-10 NOTE — Telephone Encounter (Signed)
Patient advised to contact insurance company to see what home health companies would accept [facility] insurance.   Pt requesting a referral to [facility]. AS, CMA

## 2021-06-21 LAB — PROTIME-INR: INR: 2.4 — AB (ref 0.9–1.1)

## 2021-06-22 ENCOUNTER — Telehealth: Payer: Self-pay | Admitting: Physician Assistant

## 2021-06-22 NOTE — Telephone Encounter (Signed)
Patient advised INR was 2.4 and to continue current medication regimen. Pt verbalized understanding. AS, CMA

## 2021-06-24 ENCOUNTER — Telehealth: Payer: Self-pay | Admitting: Physician Assistant

## 2021-06-24 NOTE — Telephone Encounter (Signed)
I have attempted several home health agency's to accept patient for cath change. They have all declined the referral. [facility] stating she was on the list to not be a client again.    Patient is aware of this and verbalized understanding. She has had her cath in since the 3rd week of June and has nobody to change the cath. Advised patient to go to ED for evaluation and treatment.     Attempted referral to [facility], [place] home health.

## 2021-06-27 NOTE — Telephone Encounter (Signed)
Per Herb Grays should contact Urologist to inquire if they are able to help with cath maintenance.

## 2021-07-05 LAB — PROTIME-INR: INR: 2.9 — AB (ref 0.9–1.1)

## 2021-07-18 LAB — PROTIME-INR: INR: 2.7 — AB (ref 0.9–1.1)

## 2021-07-21 ENCOUNTER — Telehealth: Payer: Self-pay | Admitting: Physician Assistant

## 2021-07-21 NOTE — Telephone Encounter (Signed)
Patient is advised INR is 2.7 and to continue current medication regimen. Pt verbalized understanding. AS, CMA

## 2021-08-08 ENCOUNTER — Other Ambulatory Visit: Payer: Self-pay | Admitting: Physician Assistant

## 2021-08-13 ENCOUNTER — Telehealth: Payer: Self-pay

## 2021-08-13 NOTE — Telephone Encounter (Signed)
Spoke to pt and she advised that she would like to have this done at the office. She does have a upcoming appointment 09/12/21 and will need to be scheduled at that time for the next physical year.   Elyse Jarvis RMA

## 2021-08-23 ENCOUNTER — Telehealth: Payer: Self-pay | Admitting: Physician Assistant

## 2021-08-23 NOTE — Telephone Encounter (Signed)
Pt declined scheduling her wellness visit at this time. She stated that she will schedule her appt in october

## 2021-08-24 ENCOUNTER — Telehealth: Payer: Self-pay

## 2021-08-24 NOTE — Telephone Encounter (Signed)
Patient aware of 08/02/2021 INR result, which was at 2.1, and INR result from 08/23/2021 which as at 2.4. Advised to continue medication regimen.

## 2021-08-28 ENCOUNTER — Other Ambulatory Visit: Payer: Self-pay | Admitting: Physician Assistant

## 2021-08-28 ENCOUNTER — Other Ambulatory Visit: Payer: Self-pay | Admitting: Nurse Practitioner

## 2021-08-28 DIAGNOSIS — G8250 Quadriplegia, unspecified: Secondary | ICD-10-CM

## 2021-08-31 ENCOUNTER — Ambulatory Visit (INDEPENDENT_AMBULATORY_CARE_PROVIDER_SITE_OTHER): Payer: Medicare Other

## 2021-08-31 DIAGNOSIS — Z0000 Encounter for general adult medical examination without abnormal findings: Secondary | ICD-10-CM | POA: Diagnosis not present
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
## 2021-08-31 NOTE — Patient Instructions (Signed)
Bone Density Test A bone density test uses a type of X-ray to measure the amount of calcium and other minerals in a person's bones. It can measure bone density in the hip and the spine. The test is similar to having a regular X-ray. This test may also be called: Bone densitometry. Bone mineral density test. Dual-energy X-ray absorptiometry (DEXA). You may have this test to: Diagnose a condition that causes weak or thin bones (osteoporosis). Screen you for osteoporosis. Predict your risk for a broken bone (fracture). Determine how well your osteoporosis treatment is working. Tell a health care provider about: Any allergies you have. All medicines you are taking, including vitamins, herbs, eye drops, creams, and over-the-counter medicines. Any problems you or family members have had with anesthetic medicines. Any blood disorders you have. Any surgeries you have had. Any medical conditions you have. Whether you are pregnant or may be pregnant. Any medical tests you have had within the past 14 days that used contrast material. What are the risks? Generally, this is a safe test. However, it does expose you to a small amount of radiation, which can slightly increase your cancer risk. What happens before the test? Do not take any calcium supplements within the 24 hours before your test. You will need to remove all metal jewelry, eyeglasses, removable dental appliances, and any other metal objects on your body. What happens during the test?  You will lie down on an exam table. There will be an X-ray generator below you and an imaging device above you. Other devices, such as boxes or braces, may be used to position your body properly for the scan. The machine will slowly scan your body. You will need to keep very still while the machine does the scan. The images will show up on a screen in the room. Images will be examined by a specialist after your test is finished. The procedure may vary among  health care providers and hospitals. What can I expect after the test? It is up to you to get the results of your test. Ask your health care provider, or the department that is doing the test, when your results will be ready. Summary A bone density test is an imaging test that uses a type of X-ray to measure the amount of calcium and other minerals in your bones. The test may be used to diagnose or screen you for a condition that causes weak or thin bones (osteoporosis), predict your risk for a broken bone (fracture), or determine how well your osteoporosis treatment is working. Do not take any calcium supplements within 24 hours before your test. Ask your health care provider, or the department that is doing the test, when your results will be ready. This information is not intended to replace advice given to you by your health care provider. Make sure you discuss any questions you have with your health care provider. Document Revised: 05/06/2020 Document Reviewed: 05/06/2020 Elsevier Patient Education  [place].  Colonoscopy, Adult A colonoscopy is a procedure to look at the entire large intestine. This procedure is done using a long, thin, flexible tube that has a camera on the end. You may have a colonoscopy: As a part of normal colorectal screening. If you have certain symptoms, such as: A low number of red blood cells in your blood (anemia). Diarrhea that does not go away. Pain in your abdomen. Blood in your stool. A colonoscopy can help screen for and diagnose medical problems, including: Tumors. Extra  tissue that grows where mucus forms (polyps). Inflammation. Areas of bleeding. Tell your health care provider about: Any allergies you have. All medicines you are taking, including vitamins, herbs, eye drops, creams, and over-the-counter medicines. Any problems you or family members have had with anesthetic medicines. Any blood disorders you have. Any surgeries you have  had. Any medical conditions you have. Any problems you have had with having bowel movements. Whether you are pregnant or may be pregnant. What are the risks? Generally, this is a safe procedure. However, problems may occur, including: Bleeding. Damage to your intestine. Allergic reactions to medicines given during the procedure. Infection. This is rare. What happens before the procedure? Eating and drinking restrictions Follow instructions from your health care provider about eating or drinking restrictions, which may include: A few days before the procedure: Follow a low-fiber diet. Avoid nuts, seeds, dried fruit, raw fruits, and vegetables. 1-3 days before the procedure: Eat only gelatin dessert or ice pops. Drink only clear liquids, such as water, clear juice, clear broth or bouillon, black coffee or tea, or clear soft drinks or sports drinks. Avoid liquids that contain red or purple dye. The day of the procedure: Do not eat solid foods. You may continue to drink clear liquids until up to 2 hours before the procedure. Do not eat or drink anything starting 2 hours before the procedure, or within the time period that your health care provider recommends. Bowel prep If you were prescribed a bowel prep to take by mouth (orally) to clean out your colon: Take it as told by your health care provider. Starting the day before your procedure, you will need to drink a large amount of liquid medicine. The liquid will cause you to have many bowel movements of loose stool until your stool becomes almost clear or light green. If your skin or the opening between the buttocks (anus) gets irritated from diarrhea, you may relieve the irritation using: Wipes with medicine in them, such as adult wet wipes with aloe and vitamin E. A product to soothe skin, such as petroleum jelly. If you vomit while drinking the bowel prep: Take a break for up to 60 minutes. Begin the bowel prep again. Call your health  care provider if you keep vomiting or you cannot take the bowel prep without vomiting. To clean out your colon, you may also be given: Laxative medicines. These help you have a bowel movement. Instructions for enema use. An enema is liquid medicine injected into your rectum. Medicines Ask your health care provider about: Changing or stopping your regular medicines or supplements. This is especially important if you are taking iron supplements, diabetes medicines, or blood thinners. Taking medicines such as aspirin and ibuprofen. These medicines can thin your blood. Do not take these medicines unless your health care provider tells you to take them. Taking over-the-counter medicines, vitamins, herbs, and supplements. General instructions Ask your health care provider what steps will be taken to help prevent infection. These may include washing skin with a germ-killing soap. Plan to have someone take you home from the hospital or clinic. What happens during the procedure?  An IV will be inserted into one of your veins. You may be given one or more of the following: A medicine to help you relax (sedative). A medicine to numb the area (local anesthetic). A medicine to make you fall asleep (general anesthetic). This is rarely needed. You will lie on your side with your knees bent. The tube will: Have oil  or gel put on it (be lubricated). Be inserted into your anus. Be gently eased through all parts of your large intestine. Air will be sent into your colon to keep it open. This may cause some pressure or cramping. Images will be taken with the camera and will appear on a screen. A small tissue sample may be removed to be looked at under a microscope (biopsy). The tissue may be sent to a lab for testing if any signs of problems are found. If small polyps are found, they may be removed and checked for cancer cells. When the procedure is finished, the tube will be removed. The procedure may vary  among health care providers and hospitals. What happens after the procedure? Your blood pressure, heart rate, breathing rate, and blood oxygen level will be monitored until you leave the hospital or clinic. You may have a small amount of blood in your stool. You may pass gas and have mild cramping or bloating in your abdomen. This is caused by the air that was used to open your colon during the exam. Do not drive for 24 hours after the procedure. It is up to you to get the results of your procedure. Ask your health care provider, or the department that is doing the procedure, when your results will be ready. Summary A colonoscopy is a procedure to look at the entire large intestine. Follow instructions from your health care provider about eating and drinking before the procedure. If you were prescribed an oral bowel prep to clean out your colon, take it as told by your health care provider. During the colonoscopy, a flexible tube with a camera on its end is inserted into the anus and then passed into the other parts of the large intestine. This information is not intended to replace advice given to you by your health care provider. Make sure you discuss any questions you have with your health care provider. Document Revised: 06/13/2019 Document Reviewed: 06/13/2019 Elsevier Patient Education  [place] Prevention in the Home, Adult Falls can cause injuries and can happen to people of all ages. There are many things you can do to make your home safe and to help prevent falls. Ask for help when making these changes. What actions can I take to prevent falls? General Instructions Use good lighting in all rooms. Replace any light bulbs that burn out. Turn on the lights in dark areas. Use night-lights. Keep items that you use often in easy-to-reach places. Lower the shelves around your home if needed. Set up your furniture so you have a clear path. Avoid moving your furniture  around. Do not have throw rugs or other things on the floor that can make you trip. Avoid walking on wet floors. If any of your floors are uneven, fix them. Add color or contrast paint or tape to clearly mark and help you see: Grab bars or handrails. First and last steps of staircases. Where the edge of each step is. If you use a stepladder: Make sure that it is fully opened. Do not climb a closed stepladder. Make sure the sides of the stepladder are locked in place. Ask someone to hold the stepladder while you use it. Know where your pets are when moving through your home. What can I do in the bathroom?   Keep the floor dry. Clean up any water on the floor right away. Remove soap buildup in the tub or shower. Use nonskid mats or decals on the  floor of the tub or shower. Attach bath mats securely with double-sided, nonslip rug tape. If you need to sit down in the shower, use a plastic, nonslip stool. Install grab bars by the toilet and in the tub and shower. Do not use towel bars as grab bars. What can I do in the bedroom? Make sure that you have a light by your bed that is easy to reach. Do not use any sheets or blankets for your bed that hang to the floor. Have a firm chair with side arms that you can use for support when you get dressed. What can I do in the kitchen? Clean up any spills right away. If you need to reach something above you, use a step stool with a grab bar. Keep electrical cords out of the way. Do not use floor polish or wax that makes floors slippery. What can I do with my stairs? Do not leave any items on the stairs. Make sure that you have a light switch at the top and the bottom of the stairs. Make sure that there are handrails on both sides of the stairs. Fix handrails that are broken or loose. Install nonslip stair treads on all your stairs. Avoid having throw rugs at the top or bottom of the stairs. Choose a carpet that does not hide the edge of the steps  on the stairs. Check carpeting to make sure that it is firmly attached to the stairs. Fix carpet that is loose or worn. What can I do on the outside of my home? Use bright outdoor lighting. Fix the edges of walkways and driveways and fix any cracks. Remove anything that might make you trip as you walk through a door, such as a raised step or threshold. Trim any bushes or trees on paths to your home. Check to see if handrails are loose or broken and that both sides of all steps have handrails. Install guardrails along the edges of any raised decks and porches. Clear paths of anything that can make you trip, such as tools or rocks. Have leaves, snow, or ice cleared regularly. Use sand or salt on paths during winter. Clean up any spills in your garage right away. This includes grease or oil spills. What other actions can I take? Wear shoes that: Have a low heel. Do not wear high heels. Have rubber bottoms. Feel good on your feet and fit well. Are closed at the toe. Do not wear open-toe sandals. Use tools that help you move around if needed. These include: Canes. Walkers. Scooters. Crutches. Review your medicines with your doctor. Some medicines can make you feel dizzy. This can increase your chance of falling. Ask your doctor what else you can do to help prevent falls. Where to find more information Centers for Disease Control and Prevention, [place]: http://www.wolf.info/ National Institute on Aging: http://kim-miller.com/ Contact a doctor if: You are afraid of falling at home. You feel weak, drowsy, or dizzy at home. You fall at home. Summary There are many simple things that you can do to make your home safe and to help prevent falls. Ways to make your home safe include removing things that can make you trip and installing grab bars in the bathroom. Ask for help when making these changes in your home. This information is not intended to replace advice given to you by your health care provider.  Make sure you discuss any questions you have with your health care provider. Document Revised: 06/23/2020 Document Reviewed: 06/23/2020 Elsevier  Patient Education  2022 [place] Maintenance, Female Adopting a healthy lifestyle and getting preventive care are important in promoting health and wellness. Ask your health care provider about: The right schedule for you to have regular tests and exams. Things you can do on your own to prevent diseases and keep yourself healthy. What should I know about diet, weight, and exercise? Eat a healthy diet  Eat a diet that includes plenty of vegetables, fruits, low-fat dairy products, and lean protein. Do not eat a lot of foods that are high in solid fats, added sugars, or sodium. Maintain a healthy weight Body mass index (BMI) is used to identify weight problems. It estimates body fat based on height and weight. Your health care provider can help determine your BMI and help you achieve or maintain a healthy weight. Get regular exercise Get regular exercise. This is one of the most important things you can do for your health. Most adults should: Exercise for at least 150 minutes each week. The exercise should increase your heart rate and make you sweat (moderate-intensity exercise). Do strengthening exercises at least twice a week. This is in addition to the moderate-intensity exercise. Spend less time sitting. Even light physical activity can be beneficial. Watch cholesterol and blood lipids Have your blood tested for lipids and cholesterol at 66 years of age, then have this test every 5 years. Have your cholesterol levels checked more often if: Your lipid or cholesterol levels are high. You are older than 66 years of age. You are at high risk for heart disease. What should I know about cancer screening? Depending on your health history and family history, you may need to have cancer screening at various ages. This may include screening  for: Breast cancer. Cervical cancer. Colorectal cancer. Skin cancer. Lung cancer. What should I know about heart disease, diabetes, and high blood pressure? Blood pressure and heart disease High blood pressure causes heart disease and increases the risk of stroke. This is more likely to develop in people who have high blood pressure readings, are of African descent, or are overweight. Have your blood pressure checked: Every 3-5 years if you are 57-30 years of age. Every year if you are 38 years old or older. Diabetes Have regular diabetes screenings. This checks your fasting blood sugar level. Have the screening done: Once every three years after age 48 if you are at a normal weight and have a low risk for diabetes. More often and at a younger age if you are overweight or have a high risk for diabetes. What should I know about preventing infection? Hepatitis B If you have a higher risk for hepatitis B, you should be screened for this virus. Talk with your health care provider to find out if you are at risk for hepatitis B infection. Hepatitis C Testing is recommended for: Everyone born from [birth year] through 1966. Anyone with known risk factors for hepatitis C. Sexually transmitted infections (STIs) Get screened for STIs, including gonorrhea and chlamydia, if: You are sexually active and are younger than 66 years of age. You are older than 66 years of age and your health care provider tells you that you are at risk for this type of infection. Your sexual activity has changed since you were last screened, and you are at increased risk for chlamydia or gonorrhea. Ask your health care provider if you are at risk. Ask your health care provider about whether you are at high risk for HIV. Your health  care provider may recommend a prescription medicine to help prevent HIV infection. If you choose to take medicine to prevent HIV, you should first get tested for HIV. You should then be tested every 3  months for as long as you are taking the medicine. Pregnancy If you are about to stop having your period (premenopausal) and you may become pregnant, seek counseling before you get pregnant. Take 400 to 800 micrograms (mcg) of folic acid every day if you become pregnant. Ask for birth control (contraception) if you want to prevent pregnancy. Osteoporosis and menopause Osteoporosis is a disease in which the bones lose minerals and strength with aging. This can result in bone fractures. If you are 68 years old or older, or if you are at risk for osteoporosis and fractures, ask your health care provider if you should: Be screened for bone loss. Take a calcium or vitamin D supplement to lower your risk of fractures. Be given hormone replacement therapy (HRT) to treat symptoms of menopause. Follow these instructions at home: Lifestyle Do not use any products that contain nicotine or tobacco, such as cigarettes, e-cigarettes, and chewing tobacco. If you need help quitting, ask your health care provider. Do not use street drugs. Do not share needles. Ask your health care provider for help if you need support or information about quitting drugs. Alcohol use Do not drink alcohol if: Your health care provider tells you not to drink. You are pregnant, may be pregnant, or are planning to become pregnant. If you drink alcohol: Limit how much you use to 0-1 drink a day. Limit intake if you are breastfeeding. Be aware of how much alcohol is in your drink. In the [place] one drink equals one 12 oz bottle of beer (355 mL), one 5 oz glass of wine (148 mL), or one 1 oz glass of hard liquor (44 mL). General instructions Schedule regular health, dental, and eye exams. Stay current with your vaccines. Tell your health care provider if: You often feel depressed. You have ever been abused or do not feel safe at home. Summary Adopting a healthy lifestyle and getting preventive care are important in promoting  health and wellness. Follow your health care provider's instructions about healthy diet, exercising, and getting tested or screened for diseases. Follow your health care provider's instructions on monitoring your cholesterol and blood pressure. This information is not intended to replace advice given to you by your health care provider. Make sure you discuss any questions you have with your health care provider. Document Revised: 01/28/2021 Document Reviewed: 11/13/2018 Elsevier Patient Education  2022 [place] A mammogram is an X-ray of the breasts. This procedure can screen for and detect any changes that may indicate breast cancer. Mammograms are regularly done beginning at age 53 for women with average risk. A man may have a mammogram if he has a lump or swelling in his breast tissue. A mammogram can also identify other changes and variations in the breast, such as: Inflammation of the breast tissue (mastitis). An infected area that contains a collection of pus (abscess). A fluid-filled sac (cyst). Tumors that are not cancerous (benign). Fibrocystic changes. This is when breast tissue becomes denser and can make the tissue feel rope-like or uneven under the skin. Women at higher risk for breast cancer need earlier and more comprehensive screening for abnormal changes. Breast tomosynthesis, or three-dimensional (3D) mammography, and digital breast tomosynthesis are advanced forms of imaging that create 3D pictures of the breasts. Tell a  health care provider: About any allergies you have. If you have breast implants. If you have had previous breast disease, biopsy, or surgery. If you have a family history of breast cancer. If you are breastfeeding. Whether you are pregnant or may be pregnant. What are the risks? Generally, this is a safe procedure. However, problems may occur, including: Exposure to radiation. Radiation levels are very low with this test. The need for  more tests. The mammogram fails to detect certain cancers or the results are misinterpreted. Difficulty with detecting breast cancer in women with dense breasts. What happens before the procedure? Schedule your test about 1-2 weeks after your menstrual period if you are menstruating. This is usually when your breasts are the least tender. If you have had a mammogram done at a different facility in the past, get the mammogram X-rays or have them sent to your current exam facility. The new and old images will be compared. Wash your breasts and underarms on the day of the test. Do not wear deodorants, perfumes, lotions, or powders anywhere on your body on the day of the test. Remove any jewelry from your neck. Wear clothes that you can change into and out of easily. What happens during the procedure?  You will undress from the waist up and put on a gown that opens in the front. You will stand in front of the X-ray machine. Each breast will be placed between two plastic or glass plates. The plates will compress your breast for a few seconds. Try to stay as relaxed as possible. This procedure does not cause any harm to your breasts. Any discomfort you feel will be very brief. X-rays will be taken from different angles of each breast. The procedure may vary among health care providers and hospitals. What can I expect after the procedure? The mammogram will be examined by a specialist (radiologist). You may need to repeat certain parts of the test, depending on the quality of the images. This is done if the radiologist needs a better view of the breast tissue. You may resume your normal activities. It is up to you to get the results of your procedure. Ask your health care provider, or the department that is doing the procedure, when your results will be ready. Summary A mammogram is an X-ray of the breasts. This procedure can screen for and detect any changes that may indicate breast cancer. A man  may have a mammogram if he has a lump or swelling in his breast tissue. If you have had a mammogram done at a different facility in the past, get the mammogram X-rays or have them sent to your current exam facility in order to compare them. Schedule your test about 1-2 weeks after your menstrual period if you are menstruating. Ask when your test results will be ready. Make sure you get your test results. This information is not intended to replace advice given to you by your health care provider. Make sure you discuss any questions you have with your health care provider. Document Revised: 09/20/2020 Document Reviewed: 09/20/2020 Elsevier Patient Education  2022 [employer].

## 2021-09-04 ENCOUNTER — Other Ambulatory Visit: Payer: Self-pay | Admitting: Physician Assistant

## 2021-09-12 ENCOUNTER — Ambulatory Visit (INDEPENDENT_AMBULATORY_CARE_PROVIDER_SITE_OTHER): Payer: Medicare Other | Admitting: Physician Assistant

## 2021-09-12 ENCOUNTER — Encounter: Payer: Self-pay | Admitting: Physician Assistant

## 2021-09-12 DIAGNOSIS — Z7901 Long term (current) use of anticoagulants: Secondary | ICD-10-CM

## 2021-09-12 DIAGNOSIS — E038 Other specified hypothyroidism: Secondary | ICD-10-CM

## 2021-09-12 DIAGNOSIS — G839 Paralytic syndrome, unspecified: Secondary | ICD-10-CM | POA: Diagnosis not present

## 2021-09-12 DIAGNOSIS — F418 Other specified anxiety disorders: Secondary | ICD-10-CM

## 2021-09-12 DIAGNOSIS — E785 Hyperlipidemia, unspecified: Secondary | ICD-10-CM | POA: Diagnosis not present

## 2021-09-12 DIAGNOSIS — Z5181 Encounter for therapeutic drug level monitoring: Secondary | ICD-10-CM | POA: Diagnosis not present

## 2021-09-12 DIAGNOSIS — G8250 Quadriplegia, unspecified: Secondary | ICD-10-CM
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
## 2021-09-24 LAB — POCT INR: INR: 2.5 — AB (ref 0.9–1.1)

## 2021-09-29 ENCOUNTER — Encounter: Payer: Self-pay | Admitting: Physician Assistant

## 2021-10-10 LAB — POCT INR: INR: 2.6 — AB (ref 0.9–1.1)

## 2021-10-11 ENCOUNTER — Encounter: Payer: Self-pay | Admitting: Physician Assistant

## 2021-10-12 ENCOUNTER — Other Ambulatory Visit: Payer: Self-pay

## 2021-10-12 ENCOUNTER — Other Ambulatory Visit: Payer: Self-pay | Admitting: Physician Assistant

## 2021-10-12 DIAGNOSIS — N319 Neuromuscular dysfunction of bladder, unspecified: Secondary | ICD-10-CM

## 2021-10-12 DIAGNOSIS — G8250 Quadriplegia, unspecified: Secondary | ICD-10-CM

## 2021-10-12 MED ORDER — WARFARIN SODIUM 2 MG PO TABS: ORAL_TABLET | ORAL | 3 refills | Status: DC

## 2021-10-12 MED ORDER — WARFARIN SODIUM 4 MG PO TABS: ORAL_TABLET | ORAL | 3 refills | Status: DC

## 2021-10-17 ENCOUNTER — Telehealth: Payer: Self-pay | Admitting: Physician Assistant

## 2021-10-17 ENCOUNTER — Other Ambulatory Visit: Payer: Self-pay | Admitting: Physician Assistant

## 2021-10-17 DIAGNOSIS — G8250 Quadriplegia, unspecified: Secondary | ICD-10-CM

## 2021-10-17 DIAGNOSIS — F418 Other specified anxiety disorders: Secondary | ICD-10-CM

## 2021-10-17 DIAGNOSIS — B379 Candidiasis, unspecified: Secondary | ICD-10-CM

## 2021-10-17 MED ORDER — FLUCONAZOLE 150 MG PO TABS
150.0000 mg | ORAL_TABLET | Freq: Once | ORAL | 0 refills | Status: AC
Start: 2021-10-17 — End: 2021-10-17

## 2021-10-17 MED ORDER — ALPRAZOLAM 0.5 MG PO TABS
ORAL_TABLET | ORAL | 0 refills | Status: DC
Start: 2021-10-17 — End: 2021-10-19

## 2021-10-17 MED ORDER — ACETAMINOPHEN-CODEINE #3 300-30 MG PO TABS: 1.0000 | ORAL_TABLET | ORAL | 0 refills | Status: DC | PRN

## 2021-10-17 NOTE — Telephone Encounter (Signed)
Patient called to request a refill on her Tylenol 3 and Diflucan 200mg  sent to [facility]. Also when valium was delivered today by mail the package was completely empty so she is unsure of what to do now. [phone]

## 2021-10-17 NOTE — Telephone Encounter (Signed)
The diflucan has been sent.

## 2021-10-18 ENCOUNTER — Telehealth: Payer: Self-pay | Admitting: Physician Assistant

## 2021-10-18 DIAGNOSIS — F418 Other specified anxiety disorders: Secondary | ICD-10-CM

## 2021-10-18 DIAGNOSIS — G8250 Quadriplegia, unspecified: Secondary | ICD-10-CM

## 2021-10-18 NOTE — Telephone Encounter (Signed)
Patient calling requesting Tylenol #3 and Alprazalam to be sent to [facility] in [place]. She is unable to get the med from [name]. AS, CMA

## 2021-10-19 MED ORDER — ALPRAZOLAM 0.5 MG PO TABS: ORAL_TABLET | ORAL | 0 refills | Status: DC

## 2021-10-19 MED ORDER — ACETAMINOPHEN-CODEINE #3 300-30 MG PO TABS: 1.0000 | ORAL_TABLET | ORAL | 0 refills | Status: DC | PRN

## 2021-10-19 NOTE — Addendum Note (Signed)
Addended by: [name] on: 10/19/2021 08:26 AM   Modules accepted: Orders

## 2021-10-24 ENCOUNTER — Encounter: Payer: Self-pay | Admitting: Physician Assistant

## 2021-10-24 LAB — POCT INR: INR: 3 — AB (ref 0.9–1.1)

## 2021-11-08 LAB — POCT INR: INR: 2.2 — AB (ref 0.9–1.1)

## 2021-11-22 LAB — POCT INR: INR: 2.7 — AB (ref 0.9–1.1)

## 2021-11-23 ENCOUNTER — Encounter: Payer: Self-pay | Admitting: Physician Assistant

## 2021-11-23 ENCOUNTER — Other Ambulatory Visit: Payer: Self-pay | Admitting: Physician Assistant

## 2021-11-23 DIAGNOSIS — G8250 Quadriplegia, unspecified: Secondary | ICD-10-CM

## 2021-11-29 ENCOUNTER — Telehealth: Payer: Self-pay | Admitting: Physician Assistant

## 2021-11-29 NOTE — Telephone Encounter (Signed)
Patient called asking why she only received 20 tablets of the valium? Please advise. [phone]

## 2021-11-30 NOTE — Telephone Encounter (Signed)
Spoke with the patient about the medication request we received from [facility]. She did not request 20 tablets, but we filled what the request was for. She sadi from now she will call the office to request a refill on her valium and for [place] to call it in at [facility].

## 2021-12-06 ENCOUNTER — Telehealth: Payer: Self-pay

## 2021-12-06 LAB — POCT INR: INR: 1.6 — AB (ref 0.9–1.1)

## 2021-12-06 NOTE — Telephone Encounter (Signed)
Called patient to notify low INR. After speaking with the provider, the patient will double her warfarin dose (2mg ) today, Thursday, and Saturday. Patient gave verbal understanding.

## 2021-12-07 ENCOUNTER — Encounter: Payer: Self-pay | Admitting: Physician Assistant

## 2021-12-11 ENCOUNTER — Encounter (HOSPITAL_BASED_OUTPATIENT_CLINIC_OR_DEPARTMENT_OTHER): Payer: Medicare Other | Attending: General Surgery | Admitting: General Surgery

## 2021-12-12 ENCOUNTER — Other Ambulatory Visit: Payer: Self-pay | Admitting: Physician Assistant

## 2021-12-12 DIAGNOSIS — G8250 Quadriplegia, unspecified: Secondary | ICD-10-CM

## 2021-12-19 LAB — POCT INR: INR: 2.9 — AB (ref 0.9–1.1)

## 2021-12-21 ENCOUNTER — Encounter: Payer: Self-pay | Admitting: Physician Assistant

## 2022-01-05 ENCOUNTER — Encounter: Payer: Self-pay | Admitting: Physician Assistant

## 2022-01-05 ENCOUNTER — Telehealth: Payer: Self-pay | Admitting: Physician Assistant

## 2022-01-05 LAB — POCT INR: INR: 4.7 — AB (ref 0.9–1.1)

## 2022-01-05 NOTE — Telephone Encounter (Signed)
Spoke with patient about high INR. [name] recommed that patient hold her warfarin dose today and tomorrow and recheck her INR in 1 week. Patient gave verbal understanding.

## 2022-01-05 NOTE — Telephone Encounter (Signed)
Patient called and stated her INR is a 4.7, patient did get seen for a wound and had to use some medication prescribed Streptomycin-vancomycin which is topical not oral so she is unsure why it's high. Please advise. [phone]

## 2022-01-11 NOTE — Progress Notes (Addendum)
[name] ([id number]) Visit Report for 01/12/2022 Allergy List Details Patient Name: Date of Service: [place], [name] 01/12/2022 2:30 PM Medical Record Number: [id number] Patient Account Number: [id number] Date of Birth/Sex: Treating RN: 02-21-1956 (66 [name] [name] Primary Care Gable Odonohue: [name] Other Clinician: Referring Jameika Kinn: Treating Klaira Pesci/Extender: Lilian Coma Weeks in Treatment: 0 Allergies Active Allergies sulfamethoxazole Severity: Moderate Active: 06/20/2019 latex Active: 12/07/2011 Allergy Notes Electronic Signature(s) Signed: 01/12/2022 5:53:53 PM By: Dellie Catholic RN Previous Signature: 01/11/2022 4:45:53 PM Version By: Donavan Burnet CHT EMT BS , , Entered By: Dellie Catholic on 01/12/2022 15:26:57 -------------------------------------------------------------------------------- Arrival Information Details Patient Name: Date of Service: [name], Angela Y. 01/12/2022 2:30 PM Medical Record Number: [id number] Patient Account Number: [id number] Date of Birth/Sex: Treating RN: 07/25/1956 (18 [name] [name] Primary Care Amaia Lavallie: [name] Other Clinician: Referring Jory Tanguma: Treating Armani Brar/Extender: Rudi Heap in Treatment: 0 Visit Information Patient Arrived: Wheel Chair Arrival Time: 15:21 Accompanied By: self Transfer Assistance: None History Since Last Visit Added or deleted any medications: No Any new allergies or adverse reactions: No Had a fall or experienced change in activities of daily living that may affect risk of falls: No Signs or symptoms of abuse/neglect since last visito No Hospitalized since last visit: No Implantable device outside of the clinic excluding cellular tissue based products placed in the center since last visit: No Has Dressing in Place as Prescribed: Yes Pain Present Now: No Notes Pt. stayed in the W/C Electronic Signature(s) Signed:  01/12/2022 5:53:53 PM By: Dellie Catholic RN Signed: 01/12/2022 5:53:53 PM By: Dellie Catholic RN Entered By: Dellie Catholic on 01/12/2022 15:24:32 -------------------------------------------------------------------------------- Clinic Level of Care Assessment Details Patient Name: Date of Service: [name], Angela Y. 01/12/2022 2:30 PM Medical Record Number: [id number] Patient Account Number: [id number] Date of Birth/Sex: Treating RN: July 12, 1956 (12 [name] [name] Primary Care Ryane Canavan: [name] Other Clinician: Referring Allesha Aronoff: Treating Sufyan Meidinger/Extender: Rudi Heap in Treatment: 0 Clinic Level of Care Assessment Items TOOL 1 Quantity Score X- 1 0 Use when EandM and Procedure is performed on INITIAL visit ASSESSMENTS - Nursing Assessment / Reassessment X- 1 20 General Physical Exam (combine w/ comprehensive assessment (listed just below) when performed on new pt. evals) X- 1 25 Comprehensive Assessment (HX, ROS, Risk Assessments, Wounds Hx, etc.) ASSESSMENTS - Wound and Skin Assessment / Reassessment X- 1 10 Dermatologic / Skin Assessment (not related to wound area) ASSESSMENTS - Ostomy and/or Continence Assessment and Care []  - 0 Incontinence Assessment and Management []  - 0 Ostomy Care Assessment and Management (repouching, etc.) PROCESS - Coordination of Care []  - 0 Simple Patient / Family Education for ongoing care X- 1 20 Complex (extensive) Patient / Family Education for ongoing care X- 1 10 Staff obtains Programmer, systems, Records, T Results / Process Orders est X- 1 10 Staff telephones HHA, Nursing Homes / Clarify orders / etc []  - 0 Routine Transfer to another Facility (non-emergent condition) []  - 0 Routine Hospital Admission (non-emergent condition) X- 1 15 New Admissions / Biomedical engineer / Ordering NPWT Apligraf, etc. , []  - 0 Emergency Hospital Admission (emergent condition) PROCESS - Special Needs []  -  0 Pediatric / Minor Patient Management []  - 0 Isolation Patient Management []  - 0 Hearing / Language / Visual special needs []  - 0 Assessment of Community assistance (transportation, D/C planning, etc.) []  - 0 Additional assistance / Altered mentation []  - 0 Support Surface(s) Assessment (bed, cushion,  seat, etc.) INTERVENTIONS - Miscellaneous []  - 0 External ear exam X- 1 10 Patient Transfer (multiple staff / Civil Service fast streamer / Similar devices) []  - 0 Simple Staple / Suture removal (25 or less) []  - 0 Complex Staple / Suture removal (26 or more) []  - 0 Hypo/Hyperglycemic Management (do not check if billed separately) []  - 0 Ankle / Brachial Index (ABI) - do not check if billed separately Has the patient been seen at the hospital within the last three years: Yes Total Score: 120 Level Of Care: New/Established - Level 4 Electronic Signature(s) Signed: 01/12/2022 6:06:04 PM By: Dellie Catholic RN Entered By: Dellie Catholic on 01/12/2022 18:04:19 -------------------------------------------------------------------------------- Encounter Discharge Information Details Patient Name: Date of Service: [name], Angela RTHA Y. 01/12/2022 2:30 PM Medical Record Number: [id number] Patient Account Number: [id number] Date of Birth/Sex: Treating RN: 09/23/1956 (38 [name] [name] Primary Care Sandeep Delagarza: [name] Other Clinician: Referring Yamile Roedl: Treating Jenetta Wease/Extender: Rudi Heap in Treatment: 0 Encounter Discharge Information Items Post Procedure Vitals Discharge Condition: Stable Temperature (F): 98.4 Ambulatory Status: Wheelchair Pulse (bpm): 108 Discharge Destination: Home Respiratory Rate (breaths/min): 16 Transportation: Private Auto Blood Pressure (mmHg): 82/51 Accompanied By: self Schedule Follow-up Appointment: Yes Clinical Summary of Care: Patient Declined Electronic Signature(s) Signed: 01/12/2022 5:53:53 PM By: Dellie Catholic  RN Entered By: Dellie Catholic on 01/12/2022 17:53:21 -------------------------------------------------------------------------------- Lower Extremity Assessment Details Patient Name: Date of Service: [name], [name] 01/12/2022 2:30 PM Medical Record Number: [id number] Patient Account Number: [id number] Date of Birth/Sex: Treating RN: 1956-10-25 (72 [name] [name] Primary Care Rosey Eide: [name] Other Clinician: Referring Zilphia Kozinski: Treating Braxden Lovering/Extender: Rudi Heap in Treatment: 0 Edema Assessment Assessed: Shirlyn Goltz: No] [Right: No] E[Left: dema] [Right: :] Calf Left: Right: Point of Measurement: From Medial Instep 28.2 cm Ankle Left: Right: Point of Measurement: From Medial Instep 20.2 cm Electronic Signature(s) Signed: 01/12/2022 5:53:53 PM By: Dellie Catholic RN Entered By: Dellie Catholic on 01/12/2022 15:48:53 -------------------------------------------------------------------------------- Multi Wound Chart Details Patient Name: Date of Service: [name], Angela RTHA Y. 01/12/2022 2:30 PM Medical Record Number: [id number] Patient Account Number: [id number] Date of Birth/Sex: Treating RN: 1956-09-05 (57 [name] [name] Primary Care Latrell Reitan: [name] Other Clinician: Referring Morine Kohlman: Treating Kartik Fernando/Extender: Rudi Heap in Treatment: 0 Vital Signs Height(in): 64 Pulse(bpm): 108 Weight(lbs): 175 Blood Pressure(mmHg): 82/51 Body Mass Index(BMI): 30 Temperature(F): 98.4 Respiratory Rate(breaths/min): 16 Photos: [N/A:N/A] Left Calcaneus Left, Posterior Ankle N/A Wound Location: Pressure Injury Pressure Injury N/A Wounding Event: Pressure Ulcer Pressure Ulcer N/A Primary Etiology: Cataracts, Hypotension, Neuropathy, Cataracts, Hypotension, Neuropathy, N/A Comorbid History: Quadriplegia Quadriplegia 06/10/2021 11/03/2021 N/A Date Acquired: 0 0 N/A Weeks of Treatment: Open  Open N/A Wound Status: No No N/A Wound Recurrence: 4x2x0.3 3x1.8x0.4 N/A Measurements L x W x D (cm) 6.283 4.241 N/A A (cm) : rea 1.885 1.696 N/A Volume (cm) : 0.00% 0.00% N/A % Reduction in A rea: 0.00% 0.00% N/A % Reduction in Volume: Category/Stage III Category/Stage III N/A Classification: Large Large N/A Exudate A mount: Serosanguineous Serosanguineous N/A Exudate Type: red, Walker red, Walker N/A Exudate Color: Thickened N/A N/A Wound Margin: Small (1-33%) Small (1-33%) N/A Granulation A mount: Pink Red N/A Granulation Quality: Large (67-100%) Large (67-100%) N/A Necrotic A mount: Fat Layer (Subcutaneous Tissue): Yes Fat Layer (Subcutaneous Tissue): Yes N/A Exposed Structures: Fascia: No Fascia: No Tendon: No Tendon: No Muscle: No Muscle: No Joint: No Joint: No Bone: No Bone: No None None N/A Epithelialization: Debridement - Excisional  Debridement - Excisional N/A Debridement: Pre-procedure Verification/Time Out 16:15 16:15 N/A Taken: Other Other N/A Pain Control: Subcutaneous, Slough Subcutaneous, Slough N/A Tissue Debrided: Skin/Subcutaneous Tissue Skin/Subcutaneous Tissue N/A Level: 8 5.4 N/A Debridement A (sq cm): rea Curette Curette N/A Instrument: Moderate Moderate N/A Bleeding: Pressure Pressure N/A Hemostasis A chieved: 0 0 N/A Procedural Pain: 0 0 N/A Post Procedural Pain: Procedure was tolerated well Procedure was tolerated well N/A Debridement Treatment Response: 4x2x0.3 3x1.8x0.4 N/A Post Debridement Measurements L x W x D (cm) 1.885 1.696 N/A Post Debridement Volume: (cm) Category/Stage III Category/Stage III N/A Post Debridement Stage: Pt. stated that she has been taking N/A N/A Assessment Notes: Keystone Antibiotics (SINCE LAST FRIDAY 01/06/22) Debridement Debridement N/A Procedures Performed: Treatment Notes Electronic Signature(s) Signed: 01/12/2022 5:29:10 PM By: Linton Ham MD Signed: 01/12/2022 6:05:40 PM  By: Levan Hurst RN, BSN Entered By: Linton Ham on 01/12/2022 16:49:23 -------------------------------------------------------------------------------- Multi-Disciplinary Care Plan Details Patient Name: Date of Service: [name], Angela RTHA Y. 01/12/2022 2:30 PM Medical Record Number: [id number] Patient Account Number: [id number] Date of Birth/Sex: Treating RN: May 11, 1956 (32 [name] [name] Primary Care Ollin Hochmuth: [name] Other Clinician: Referring Kynlie Jane: Treating Edgel Degnan/Extender: Rudi Heap in Treatment: 0 Active Inactive Wound/Skin Impairment Nursing Diagnoses: Impaired tissue integrity Knowledge deficit related to smoking impact on wound healing Goals: Patient/caregiver will verbalize understanding of skin care regimen Date Initiated: 01/12/2022 Target Resolution Date: 02/09/2022 Goal Status: Active Interventions: Assess patient/caregiver ability to obtain necessary supplies Assess patient/caregiver ability to perform ulcer/skin care regimen upon admission and as needed Assess ulceration(s) every visit Provide education on smoking Provide education on ulcer and skin care Notes: Electronic Signature(s) Signed: 01/12/2022 5:53:53 PM By: Dellie Catholic RN Entered By: Dellie Catholic on 01/12/2022 17:47:20 -------------------------------------------------------------------------------- Pain Assessment Details Patient Name: Date of Service: [name], [name] 01/12/2022 2:30 PM Medical Record Number: [id number] Patient Account Number: [id number] Date of Birth/Sex: Treating RN: March 04, 1956 (89 [name] [name] Primary Care Davey Bergsma: [name] Other Clinician: Referring Joaquin Knebel: Treating Makaylia Hewett/Extender: Rudi Heap in Treatment: 0 Active Problems Location of Pain Severity and Description of Pain Patient Has Paino No Site Locations Pain Management and Medication Current Pain  Management: Electronic Signature(s) Signed: 01/12/2022 5:53:53 PM By: Dellie Catholic RN Entered By: Dellie Catholic on 01/12/2022 16:05:02 -------------------------------------------------------------------------------- Patient/Caregiver Education Details Patient Name: Date of Service: [name], [name] 2/9/2023andnbsp2:30 PM Medical Record Number: [id number] Patient Account Number: [id number] Date of Birth/Gender: Treating RN: 12-10-1955 (47 [name] [name] Primary Care Physician: [name] Other Clinician: Referring Physician: Treating Physician/Extender: Rudi Heap in Treatment: 0 Education Assessment Education Provided To: Patient Education Topics Provided Wound/Skin Impairment: Methods: Explain/Verbal Responses: Return demonstration correctly Electronic Signature(s) Signed: 01/12/2022 5:53:53 PM By: Dellie Catholic RN Entered By: Dellie Catholic on 01/12/2022 17:47:37 -------------------------------------------------------------------------------- Wound Assessment Details Patient Name: Date of Service: [name], Angela Y. 01/12/2022 2:30 PM Medical Record Number: [id number] Patient Account Number: [id number] Date of Birth/Sex: Treating RN: [birth year] (59 [name] [name] Primary Care Sharief Wainwright: [name] Other Clinician: Referring Vertis Scheib: Treating Jonetta Dagley/Extender: Rudi Heap in Treatment: 0 Wound Status Wound Number: [id number] Primary Etiology: Pressure Ulcer Wound Location: Left Calcaneus Wound Status: Open Wounding Event: Pressure Injury Comorbid History: Cataracts, Hypotension, Neuropathy, Quadriplegia Date Acquired: 06/10/2021 Weeks Of Treatment: 0 Clustered Wound: No Photos Wound Measurements Length: (cm) 4 Width: (cm) 2 Depth: (cm) 0.3 Area: (cm) 6.283 Volume: (cm) 1.885 % Reduction in Area: 0% % Reduction  in Volume: 0% Epithelialization: None Tunneling: No Undermining:  No Wound Description Classification: Category/Stage III Wound Margin: Thickened Exudate Amount: Large Exudate Type: Serosanguineous Exudate Color: red, Walker Foul Odor After Cleansing: No Slough/Fibrino Yes Wound Bed Granulation Amount: Small (1-33%) Exposed Structure Granulation Quality: Pink Fascia Exposed: No Necrotic Amount: Large (67-100%) Fat Layer (Subcutaneous Tissue) Exposed: Yes Necrotic Quality: Adherent Slough Tendon Exposed: No Muscle Exposed: No Joint Exposed: No Bone Exposed: No Assessment Notes Pt. stated that she has been taking Keystone Antibiotics (SINCE LAST FRIDAY 01/06/22) Electronic Signature(s) Signed: 01/12/2022 5:53:53 PM By: Dellie Catholic RN Signed: 01/12/2022 6:05:40 PM By: Levan Hurst RN, BSN Previous Signature: 01/12/2022 3:24:34 PM Version By: Sandre Kitty Entered By: Dellie Catholic on 01/12/2022 15:53:38 -------------------------------------------------------------------------------- Wound Assessment Details Patient Name: Date of Service: [name], Angela RTHA Y. 01/12/2022 2:30 PM Medical Record Number: [id number] Patient Account Number: [id number] Date of Birth/Sex: Treating RN: [birth year] (4 [name] [name] Primary Care Batya Citron: [name] Other Clinician: Referring Justa Hatchell: Treating Abryana Lykens/Extender: Rudi Heap in Treatment: 0 Wound Status Wound Number: [id number] Primary Etiology: Pressure Ulcer Wound Location: Left, Posterior Ankle Wound Status: Open Wounding Event: Pressure Injury Comorbid History: Cataracts, Hypotension, Neuropathy, Quadriplegia Date Acquired: 11/03/2021 Weeks Of Treatment: 0 Clustered Wound: No Photos Wound Measurements Length: (cm) 3 Width: (cm) 1.8 Depth: (cm) 0.4 Area: (cm) 4.241 Volume: (cm) 1.696 % Reduction in Area: 0% % Reduction in Volume: 0% Epithelialization: None Tunneling: No Undermining: No Wound Description Classification: Category/Stage III Exudate  Amount: Large Exudate Type: Serosanguineous Exudate Color: red, Walker Foul Odor After Cleansing: No Slough/Fibrino Yes Wound Bed Granulation Amount: Small (1-33%) Exposed Structure Granulation Quality: Red Fascia Exposed: No Necrotic Amount: Large (67-100%) Fat Layer (Subcutaneous Tissue) Exposed: Yes Necrotic Quality: Adherent Slough Tendon Exposed: No Muscle Exposed: No Joint Exposed: No Bone Exposed: No Treatment Notes Wound #23 (Ankle) Wound Laterality: Left, Posterior Cleanser Soap and Water Discharge Instruction: May shower and wash wound with dial antibacterial soap and water prior to dressing change. Wound Cleanser Discharge Instruction: Cleanse the wound with wound cleanser prior to applying a clean dressing using gauze sponges, not tissue or cotton balls. Peri-Wound Care Topical Triamcinolone Discharge Instruction: Apply TCA around wound Keystone antibiotics Discharge Instruction: thin layer on wound bed under hydrofera Primary Dressing Hydrofera Blue Ready Foam, 4x5 in Discharge Instruction: Apply to wound bed as instructed Secondary Dressing Woven Gauze Sponge, Non-Sterile 4x4 in Discharge Instruction: Apply over primary dressing as directed. Zetuvit Plus Silicone Border Dressing 7x7(in/in) Discharge Instruction: Apply silicone border over primary dressing as directed. Secured With 24M Medipore H Soft Cloth Surgical T ape, 4 x 10 (in/yd) Discharge Instruction: Secure with tape as directed. Compression Wrap Compression Stockings Add-Ons Electronic Signature(s) Signed: 01/12/2022 5:53:53 PM By: Dellie Catholic RN Signed: 01/12/2022 6:05:40 PM By: Levan Hurst RN, BSN Previous Signature: 01/12/2022 3:24:34 PM Version By: Sandre Kitty Entered By: Dellie Catholic on 01/12/2022 15:56:15 -------------------------------------------------------------------------------- Vitals Details Patient Name: Date of Service: [name], Angela [place] Y. 01/12/2022 2:30 PM Medical  Record Number: [id number] Patient Account Number: [id number] Date of Birth/Sex: Treating RN: 19-Oct-1956 (13 [name] [name] Primary Care Mysti Haley: [name] Other Clinician: Referring Mycah Mcdougall: Treating Gregorey Nabor/Extender: Rudi Heap in Treatment: 0 Vital Signs Time Taken: 15:24 Temperature (F): 98.4 Height (in): 64 Pulse (bpm): 108 Source: Stated Respiratory Rate (breaths/min): 16 Weight (lbs): 175 Blood Pressure (mmHg): 82/51 Source: Stated Reference Range: 80 - 120 mg / dl Body Mass Index (BMI): 30 Electronic Signature(s) Signed: 01/12/2022 5:53:53  PM By: Dellie Catholic RN Entered By: Dellie Catholic on 01/12/2022 15:26:13

## 2022-01-12 ENCOUNTER — Encounter (HOSPITAL_BASED_OUTPATIENT_CLINIC_OR_DEPARTMENT_OTHER): Payer: Medicare Other | Attending: Internal Medicine | Admitting: Internal Medicine

## 2022-01-12 ENCOUNTER — Other Ambulatory Visit: Payer: Self-pay

## 2022-01-12 ENCOUNTER — Encounter: Payer: Self-pay | Admitting: Physician Assistant

## 2022-01-12 DIAGNOSIS — Z981 Arthrodesis status: Secondary | ICD-10-CM | POA: Diagnosis not present

## 2022-01-12 DIAGNOSIS — G8250 Quadriplegia, unspecified: Secondary | ICD-10-CM | POA: Diagnosis not present

## 2022-01-12 DIAGNOSIS — L89623 Pressure ulcer of left heel, stage 3: Secondary | ICD-10-CM | POA: Diagnosis not present

## 2022-01-12 DIAGNOSIS — Z7901 Long term (current) use of anticoagulants: Secondary | ICD-10-CM | POA: Insufficient documentation

## 2022-01-12 DIAGNOSIS — L97828 Non-pressure chronic ulcer of other part of left lower leg with other specified severity: Secondary | ICD-10-CM | POA: Insufficient documentation

## 2022-01-12 LAB — POCT INR: INR: 2.6 — AB (ref 0.9–1.1)

## 2022-01-12 NOTE — Progress Notes (Signed)
[name], [name] ([id number]) Visit Report for 01/12/2022 Abuse Risk Screen Details Patient Name: Date of Service: [place], [name] 01/12/2022 2:30 PM Medical Record Number: [id number] Patient Account Number: [id number] Date of Birth/Sex: Treating RN: 1956/02/18 (66 y.o. [name] Primary Care Mabry Tift: [name] Other Clinician: Referring Haylo Fake: Treating Severa Jeremiah/Extender: Rudi Heap in Treatment: 0 Abuse Risk Screen Items Answer ABUSE RISK SCREEN: Has anyone close to you tried to hurt or harm you recentlyo No Do you feel uncomfortable with anyone in your familyo No Has anyone forced you do things that you didnt want to doo No Electronic Signature(s) Signed: 01/12/2022 5:53:53 PM By: Dellie Catholic RN Entered By: Dellie Catholic on 01/12/2022 15:37:40 -------------------------------------------------------------------------------- Activities of Daily Living Details Patient Name: Date of Service: [name], [name] 01/12/2022 2:30 PM Medical Record Number: [id number] Patient Account Number: [id number] Date of Birth/Sex: Treating RN: 06/23/1956 (66 y.o. [name] Primary Care Rosmery Duggin: [name] Other Clinician: Referring Sherrin Stahle: Treating Adna Nofziger/Extender: Rudi Heap in Treatment: 0 Activities of Daily Living Items Answer Activities of Daily Living (Please select one for each item) Drive Automobile Not Able T Medications ake Need Assistance Use T elephone Need Assistance Care for Appearance Not Able Use T oilet Not Able Manus Rudd / Shower Not Able Dress Self Not Able Feed Self Not Able Walk Not Able Get In / Out Bed Not Able Housework Not Able Prepare Meals Not Able Handle Money Not Able Shop for Self Not Able Electronic Signature(s) Signed: 01/12/2022 5:53:53 PM By: Dellie Catholic RN Entered By: Dellie Catholic on 01/12/2022  15:38:42 -------------------------------------------------------------------------------- Education Screening Details Patient Name: Date of Service: [name], [place] Y. 01/12/2022 2:30 PM Medical Record Number: [id number] Patient Account Number: [id number] Date of Birth/Sex: Treating RN: May 25, 1956 (66 y.o. [name] Primary Care Fauna Neuner: [name] Other Clinician: Referring Anaya Bovee: Treating Caelum Federici/Extender: Rudi Heap in Treatment: 0 Primary Learner Assessed: Patient Learning Preferences/Education Level/Primary Language Learning Preference: Explanation, Demonstration, Printed Material Highest Education Level: High School Preferred Language: English Cognitive Barrier Language Barrier: No Translator Needed: No Memory Deficit: No Emotional Barrier: No Cultural/Religious Beliefs Affecting Medical Care: No Physical Barrier Impaired Vision: Yes Legally Blind, Right eye Impaired Hearing: No Decreased Hand dexterity: Yes Limitations: Hx being a Quadraplegic Knowledge/Comprehension Knowledge Level: High Comprehension Level: High Ability to understand written instructions: High Ability to understand verbal instructions: High Motivation Anxiety Level: Calm Cooperation: Cooperative Education Importance: Acknowledges Need Interest in Health Problems: Asks Questions Perception: Coherent Willingness to Engage in Self-Management High Activities: Readiness to Engage in Self-Management High Activities: Electronic Signature(s) Signed: 01/12/2022 5:53:53 PM By: Dellie Catholic RN Entered By: Dellie Catholic on 01/12/2022 15:40:49 -------------------------------------------------------------------------------- Fall Risk Assessment Details Patient Name: Date of Service: [name], [place] Y. 01/12/2022 2:30 PM Medical Record Number: [id number] Patient Account Number: [id number] Date of Birth/Sex: Treating RN: July 15, 1956 (66 y.o. [name] Primary Care Cheryll Keisler: [name] Other Clinician: Referring Nidhi Jacome: Treating Othella Slappey/Extender: Rudi Heap in Treatment: 0 Fall Risk Assessment Items Have you had 2 or more falls in the last 12 monthso 0 No Have you had any fall that resulted in injury in the last 12 monthso 0 No FALLS RISK SCREEN History of falling - immediate or within 3 months 0 No Secondary diagnosis (Do you have 2 or more medical diagnoseso) 15 Yes Ambulatory aid None/bed rest/wheelchair/nurse 0 Yes Crutches/cane/walker 0 No Furniture 0 No Intravenous therapy Access/Saline/Heparin Lock 0 No Gait/Transferring  Normal/ bed rest/ wheelchair 0 Yes Weak (short steps with or without shuffle, stooped but able to lift head while walking, may seek 0 No support from furniture) Impaired (short steps with shuffle, may have difficulty arising from chair, head down, impaired 0 No balance) Mental Status Oriented to own ability 0 No Electronic Signature(s) Signed: 01/12/2022 5:53:53 PM By: Dellie Catholic RN Entered By: Dellie Catholic on 01/12/2022 15:41:53 -------------------------------------------------------------------------------- Foot Assessment Details Patient Name: Date of Service: Angela [place]zar[place], [name] 01/12/2022 2:30 PM Medical Record Number: [id number] Patient Account Number: [id number] Date of Birth/Sex: Treating RN: 04-10-1956 (67 y.o. [name] Primary Care Laketra Bowdish: [name] Other Clinician: Referring Kenston Longton: Treating Ayyan Sites/Extender: Rudi Heap in Treatment: 0 Foot Assessment Items Site Locations + = Sensation present, - = Sensation absent, C = Callus, U = Ulcer R = Redness, W = Warmth, M = Maceration, PU = Pre-ulcerative lesion F = Fissure, S = Swelling, D = Dryness Assessment Right: Left: Other Deformity: No No Prior Foot Ulcer: No No Prior Amputation: No No Charcot Joint: No No Ambulatory Status:  Non-ambulatory Assistance Device: Wheelchair Gait: Administrator, arts) Signed: 01/12/2022 5:53:53 PM By: Dellie Catholic RN Entered By: Dellie Catholic on 01/12/2022 15:46:52 -------------------------------------------------------------------------------- Nutrition Risk Screening Details Patient Name: Date of Service: [name], [name] 01/12/2022 2:30 PM Medical Record Number: [id number] Patient Account Number: [id number] Date of Birth/Sex: Treating RN: 09-07-1956 (66 y.o. [name] Primary Care Paidyn Mcferran: [name] Other Clinician: Referring Yazen Rosko: Treating Aleila Syverson/Extender: Rudi Heap in Treatment: 0 Height (in): 64 Weight (lbs): 175 Body Mass Index (BMI): 30 Nutrition Risk Screening Items Score Screening NUTRITION RISK SCREEN: I have an illness or condition that made me change the kind and/or amount of food I eat 0 No I eat fewer than two meals per day 3 Yes I eat few fruits and vegetables, or milk products 0 No I have three or more drinks of beer, liquor or wine almost every day 0 No I have tooth or mouth problems that make it hard for me to eat 0 No I don't always have enough money to buy the food I need 0 No I eat alone most of the time 0 No I take three or more different prescribed or over-the-counter drugs a day 1 Yes Without wanting to, I have lost or gained 10 pounds in the last six months 0 No I am not always physically able to shop, cook and/or feed myself 2 Yes Nutrition Protocols Good Risk Protocol Moderate Risk Protocol High Risk Proctocol 0 Provide education on nutrition Risk Level: High Risk Score: 6 Electronic Signature(s) Signed: 01/12/2022 5:53:53 PM By: Dellie Catholic RN Entered By: Dellie Catholic on 01/12/2022 15:43:58

## 2022-01-13 NOTE — Progress Notes (Signed)
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 There was a Excisional Skin/Subcutaneous Tissue Debridement with a total area of 8 sq cm performed by Ricard Dillon., MD. With the following instrument(s): Curette to remove Non-Viable tissue/material. Material removed includes Subcutaneous Tissue and Slough and after achieving pain control using Other (BENZOCAINE 20%). No specimens were taken. A time out was conducted at 16:15, prior to the start of the procedure. A Moderate amount of bleeding was  controlled with Pressure. The procedure was tolerated well with a pain level of 0 throughout and a pain level of 0 following the procedure. Post Debridement Measurements: 4cm length x 2cm width x 0.3cm depth; 1.885cm^3 volume. Post debridement Stage noted as Category/Stage III. Character of Wound/Ulcer Post Debridement is improved. Post procedure Diagnosis Wound #22: Same as Pre-Procedure Wound #23 Pre-procedure diagnosis of Wound #23 is a Pressure Ulcer located on the Left,Posterior Ankle . There was a Excisional Skin/Subcutaneous Tissue Debridement with a total area of 5.4 sq cm performed by Ricard Dillon., MD. With the following instrument(s): Curette to remove Non-Viable tissue/material. Material removed includes Subcutaneous Tissue and Slough and after achieving pain control using Other (BENZOCAINE 20%). No specimens were taken. A time out was conducted at 16:15, prior to the start of the procedure. A Moderate amount of bleeding was controlled with Pressure. The procedure was tolerated well with a pain level of 0 throughout and a pain level of 0 following the procedure. Post Debridement Measurements: 3cm length x 1.8cm width x 0.4cm depth; 1.696cm^3 volume. Post debridement Stage noted as Category/Stage III. Character of Wound/Ulcer Post Debridement is improved. Post procedure Diagnosis Wound #23: Same as Pre-Procedure Plan Follow-up Appointments: Return Appointment in 2 weeks. - Dr Dellia Nims Off-Loading: Heel suspension boot to: - Wear the Prevalon boots to both feet Home Health: New wound care orders this week; continue Home Health for wound care. May utilize formulary equivalent dressing for wound treatment orders unless otherwise specified. - add hydrofera blue over keystone anitbiotic change every other day Other Home Health Orders/Instructions: Jack[facility]ul[facility] [place] WOUND #22: - Calcaneus Wound Laterality: Left Cleanser: Soap and Water Every Other Day/30 Days Discharge Instructions: May  shower and wash wound with dial antibacterial soap and water prior to dressing change. Cleanser: Wound Cleanser Every Other Day/30 Days Discharge Instructions: Cleanse the wound with wound cleanser prior to applying a clean dressing using gauze sponges, not tissue or cotton balls. Topical: Triamcinolone Every Other Day/30 Days Discharge Instructions: Apply TCA around wound Topical: Keystone antibiotics Every Other Day/30 Days Discharge Instructions: thin layer on wound bed under hydrofera Prim Dressing: Hydrofera Blue Ready Foam, 4x5 in Every Other Day/30 Days ary Discharge Instructions: Apply to wound bed as instructed Secondary Dressing: Woven Gauze Sponge, Non-Sterile 4x4 in Every Other Day/30 Days Discharge Instructions: Apply over primary dressing as directed. Secondary Dressing: Zetuvit Plus Silicone  Border Dressing 7x7(in/in) Every Other Day/30 Days Discharge Instructions: Apply silicone border over primary dressing as directed. Secured With: 18M Medipore H Soft Cloth Surgical T ape, 4 x 10 (in/yd) Every Other Day/30 Days Discharge Instructions: Secure with tape as directed. WOUND #23: - Ankle Wound Laterality: Left, Posterior Cleanser: Soap and Water Every Other Day/30 Days Discharge Instructions: May shower and wash wound with dial antibacterial soap and water prior to dressing change. Cleanser: Wound Cleanser Every Other Day/30 Days Discharge Instructions: Cleanse the wound with wound cleanser prior to applying a clean dressing using gauze sponges, not tissue or cotton balls. Topical: Triamcinolone Every Other Day/30 Days Discharge Instructions: Apply TCA around wound Topical: Keystone antibiotics Every Other Day/30 Days Discharge Instructions: thin layer on wound bed under hydrofera Prim Dressing: Hydrofera Blue Ready Foam, 4x5 in Every Other Day/30 Days ary Discharge Instructions: Apply to wound bed as instructed Secondary Dressing: Woven Gauze Sponge, Non-Sterile 4x4 in Every  Other Day/30 Days Discharge Instructions: Apply over primary dressing as directed. Secondary Dressing: Zetuvit Plus Silicone Border Dressing 7x7(in/in) Every Other Day/30 Days Discharge Instructions: Apply silicone border over primary dressing as directed. Secured With: 18M Medipore H Soft Cloth Surgical T ape, 4 x 10 (in/yd) Every Other Day/30 Days Discharge Instructions: Secure with tape as directed. 1. I changed her primary dressing to her topical Keystone antibiotic streptomycin vancomycin covered with Hydrofera Blue heel cup and gauze. If she can get this changed every second day that would be fine. She has [facility] 2. I explained to her that with the much debris on the surface she has it is unlikely the [place] would get through to tis site of action/biofilm. 3. There was a culture done I can see that but I cannot see the exact results obviously a combination of gram-positive's and gram-negative's however 4. There is no evidence of soft tissue or systemic infection. The wounds are stage III at this point I did not order any imaging studies of the underlying calcaneus 5. I do not believe there is a significant arterial issue but we will try to check her ABIs next time 6. She has a large bunny boot for the left foot so she is already rigorous about offloading this area. Electronic Signature(s) Signed: 01/12/2022 5:29:10 PM By: Linton Ham MD Entered By: Linton Ham on 01/12/2022 17:03:48 -------------------------------------------------------------------------------- HxROS Details Patient Name: Date of Service: [name], [place] Y. 01/12/2022 2:30 PM Medical Record Number: [id number] Patient Account Number: [id number] Date of Birth/Sex: Treating RN: [birth year] (66 y.o. Nancy Fetter Primary Care Provider: [name] Other Clinician: Referring Provider: Treating Provider/Extender: Rudi Heap in Treatment: 0 Constitutional Symptoms  (General Health) Complaints and Symptoms: Negative for: Fatigue; Fever; Chills; Marked Weight Change Ear/Nose/Mouth/Throat Complaints and Symptoms: Negative for: Chronic sinus problems or rhinitis Respiratory Complaints and Symptoms: Negative for: Chronic or frequent coughs; Shortness of Breath Gastrointestinal Complaints and Symptoms: Negative for: Frequent diarrhea; Nausea; Vomiting Endocrine Complaints and Symptoms: Negative for: Heat/cold intolerance Medical History: Past Medical History Notes: hypothyroidism Integumentary (Skin) Complaints and Symptoms: Positive for: Wounds - LCalcaneus;Left Posterior Ankle Psychiatric Complaints and Symptoms: Negative for: Claustrophobia Medical History: Negative for: Confinement Anxiety Eyes Medical History: Positive for: Cataracts - Cataracts in both eyes Past Medical History Notes: 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 Suprapubic  catheter Immunological Musculoskeletal Medical History: Past Medical History Notes: Osteoporosis, decubitis ulcer of sacral region (stage 1); Neurologic Medical History: Positive for: Neuropathy - arm; Quadriplegia Oncologic HBO Extended History Items Eyes: Cataracts Immunizations Pneumococcal Vaccine: Received Pneumococcal Vaccination: Yes Received Pneumococcal Vaccination On or After 60th Birthday: No Tetanus Vaccine: Last tetanus shot: 12/04/2010 Implantable Devices No devices added Family and Social History Unknown History: Yes Engineer, maintenance) Signed: 01/12/2022 5:29:10 PM By: Linton Ham MD Signed: 01/12/2022 5:53:53 PM By: Dellie Catholic RN Signed: 01/12/2022 6:05:40 PM By: Levan Hurst RN, BSN Entered By:  Dellie Catholic on 01/12/2022 15:37:16 -------------------------------------------------------------------------------- [place] Details Patient Name: Date of Service: Angela Balzarine, [name] 01/12/2022 Medical Record Number: [id number] Patient Account Number: [id number] Date of Birth/Sex: Treating RN: October 30, 1956 (66 y.o. Nancy Fetter Primary Care Provider: [name] Other Clinician: Referring Provider: Treating Provider/Extender: Rudi Heap in Treatment: 0 Diagnosis Coding ICD-10 Codes Code Description [phone] Pressure ulcer of left heel, stage 3 L97.828 Non-pressure chronic ulcer of other part of left lower leg with other specified severity S14.109S Unspecified injury at unspecified level of cervical spinal cord, sequela Facility Procedures CPT4 Code: [id number] IC L Description: [place] VISIT-LEV 4 EST PT 042 - DEB SUBQ TISSUE 20 SQ CM/< D-10 Diagnosis Description 89.623 Pressure ulcer of left heel, stage 3 Modifier: 25 Quantity: 1 1 Physician Procedures : CPT4 Code Description Modifier [id number] WC PHYS LEVEL 3 NEW PT 25 ICD-10 Diagnosis Description L89.623 Pressure ulcer of left heel, stage 3 L97.828 Non-pressure chronic ulcer of other part of left lower leg with other specified severity S14.109S  Unspecified injury at unspecified level of cervical spinal cord, sequela Quantity: 1 : [id number] - WC PHYS SUBQ TISS 20 SQ CM ICD-10 Diagnosis Description L89.623 Pressure ulcer of left heel, stage 3 Quantity: 1 Electronic Signature(s) Signed: 01/12/2022 6:06:04 PM By: Dellie Catholic RN Signed: 01/13/2022 12:25:06 PM By: Linton Ham MD Previous Signature: 01/12/2022 5:29:10 PM Version By: Linton Ham MD Entered By: Dellie Catholic on 01/12/2022 18:04:55

## 2022-01-24 ENCOUNTER — Encounter (HOSPITAL_BASED_OUTPATIENT_CLINIC_OR_DEPARTMENT_OTHER): Payer: Medicare Other | Admitting: Internal Medicine

## 2022-01-24 ENCOUNTER — Other Ambulatory Visit: Payer: Self-pay

## 2022-01-24 DIAGNOSIS — L97828 Non-pressure chronic ulcer of other part of left lower leg with other specified severity: Secondary | ICD-10-CM | POA: Diagnosis not present

## 2022-01-24 NOTE — Progress Notes (Signed)
TYQUASIA, PANT ([id number]) Visit Report for 01/24/2022 Arrival Information Details Patient Name: Date of Service: [name], [name] 01/24/2022 1:00 PM Medical Record Number: [id number] Patient Account Number: [id number] Date of Birth/Sex: Treating RN: 13-Sep-1956 (67 [name] [name] Primary Care Nana Hoselton: [name] Other Clinician: Referring Penny Arrambide: Treating Estela Vinal/Extender: Rudi Heap in Treatment: 1 Visit Information History Since Last Visit Added or deleted any medications: No Patient Arrived: Wheel Chair Any new allergies or adverse reactions: No Arrival Time: 13:05 Had a fall or experienced change in No Accompanied By: self activities of daily living that may affect Transfer Assistance: Manual risk of falls: Patient Identification Verified: Yes Signs or symptoms of abuse/neglect since last visito No Patient Has Alerts: Yes Hospitalized since last visit: No Patient Alerts: Patient on Blood Thinner Implantable device outside of the clinic excluding No Coumadin cellular tissue based products placed in the center since last visit: Pain Present Now: No Electronic Signature(s) Signed: 01/24/2022 4:24:40 PM By: Dellie Catholic RN Entered By: Dellie Catholic on 01/24/2022 13:06:35 -------------------------------------------------------------------------------- Encounter Discharge Information Details Patient Name: Date of Service: [name], Angela Y. 01/24/2022 1:00 PM Medical Record Number: [id number] Patient Account Number: [id number] Date of Birth/Sex: Treating RN: 11-28-1956 (59 [name] [name] Primary Care Susano Cleckler: [name] Other Clinician: Referring Kaidyn Hernandes: Treating Jeraline Marcinek/Extender: Rudi Heap in Treatment: 1 Encounter Discharge Information Items Post Procedure Vitals Discharge Condition: Stable Temperature (F): 97.7 Ambulatory Status: Wheelchair Pulse (bpm): 52 Discharge  Destination: Home Respiratory Rate (breaths/min): 16 Transportation: Other Blood Pressure (mmHg): 117/59 Accompanied By: sister Schedule Follow-up Appointment: Yes Clinical Summary of Care: Patient Declined Electronic Signature(s) Signed: 01/24/2022 4:24:40 PM By: Dellie Catholic RN Entered By: Dellie Catholic on 01/24/2022 16:23:45 -------------------------------------------------------------------------------- Lower Extremity Assessment Details Patient Name: Date of Service: [name], [name] 01/24/2022 1:00 PM Medical Record Number: [id number] Patient Account Number: [id number] Date of Birth/Sex: Treating RN: 1956/05/03 (71 [name] [name] Primary Care Mclane Arora: [name] Other Clinician: Referring Zahli Vetsch: Treating Idella Lamontagne/Extender: Rudi Heap in Treatment: 1 Edema Assessment Assessed: [Left: No] [Right: No] E[Left: dema] [Right: :] Calf Left: Right: Point of Measurement: 31 cm From Medial Instep 28.3 cm Ankle Left: Right: Point of Measurement: 7 cm From Medial Instep 20.2 cm Knee To Floor Left: Right: From Medial Instep 44 cm Vascular Assessment Pulses: Dorsalis Pedis Palpable: [Left:Yes] Blood Pressure: Brachial: [Left:117] Ankle: [Left:Dorsalis Pedis: 136 1.16] Electronic Signature(s) Signed: 01/24/2022 4:24:40 PM By: Dellie Catholic RN Entered By: Dellie Catholic on 01/24/2022 13:52:23 -------------------------------------------------------------------------------- Multi Wound Chart Details Patient Name: Date of Service: Angela Balzarine, Angela RTHA Y. 01/24/2022 1:00 PM Medical Record Number: [id number] Patient Account Number: [id number] Date of Birth/Sex: Treating RN: 13-Apr-1956 (1 [name] [name] Primary Care Ferdinand Revoir: [name] Other Clinician: Referring Keniah Klemmer: Treating Emilee Market/Extender: Rudi Heap in Treatment: 1 Vital Signs Height(in): 64 Pulse(bpm): 52 Weight(lbs):  175 Blood Pressure(mmHg): 117/59 Body Mass Index(BMI): 30 Temperature(F): 97.7 Respiratory Rate(breaths/min): 16 Photos: [N/A:N/A] Left Calcaneus Left, Posterior Ankle N/A Wound Location: Pressure Injury Pressure Injury N/A Wounding Event: Pressure Ulcer Pressure Ulcer N/A Primary Etiology: Cataracts, Hypotension, Neuropathy, Cataracts, Hypotension, Neuropathy, N/A Comorbid History: Quadriplegia Quadriplegia 11/03/2021 11/03/2021 N/A Date Acquired: 1 1 N/A Weeks of Treatment: Open Open N/A Wound Status: No No N/A Wound Recurrence: 3.8x2x0.3 3x1.7x0.4 N/A Measurements L x W x D (cm) 5.969 4.006 N/A A (cm) : rea 1.791 1.602 N/A Volume (cm) : 5.00% 5.50% N/A % Reduction in A rea:  5.00% 5.50% N/A % Reduction in Volume: Category/Stage III Category/Stage III N/A Classification: Large Large N/A Exudate A mount: Serosanguineous Serosanguineous N/A Exudate Type: red, brown red, brown N/A Exudate Color: Thickened N/A N/A Wound Margin: Medium (34-66%) Medium (34-66%) N/A Granulation A mount: Pink Red N/A Granulation Quality: Medium (34-66%) Medium (34-66%) N/A Necrotic A mount: Fat Layer (Subcutaneous Tissue): Yes Fat Layer (Subcutaneous Tissue): Yes N/A Exposed Structures: Fascia: No Fascia: No Tendon: No Tendon: No Muscle: No Muscle: No Joint: No Joint: No Bone: No Bone: No None None N/A Epithelialization: Treatment Notes Electronic Signature(s) Signed: 01/24/2022 4:24:40 PM By: Dellie Catholic RN Signed: 01/24/2022 4:44:59 PM By: Linton Ham MD Entered By: Linton Ham on 01/24/2022 13:39:22 -------------------------------------------------------------------------------- Pain Assessment Details Patient Name: Date of Service: Angela Balzarine, Angela RTHA Y. 01/24/2022 1:00 PM Medical Record Number: [id number] Patient Account Number: [id number] Date of Birth/Sex: Treating RN: 1956-10-26 (5 [name] [name] Primary Care Diezel Mazur: [name] Other Clinician: Referring Kami Kube: Treating Theresa Dohrman/Extender: Rudi Heap in Treatment: 1 Active Problems Location of Pain Severity and Description of Pain Patient Has Paino No Site Locations Pain Management and Medication Current Pain Management: Electronic Signature(s) Signed: 01/24/2022 4:24:40 PM By: Dellie Catholic RN Entered By: Dellie Catholic on 01/24/2022 13:07:21 -------------------------------------------------------------------------------- Patient/Caregiver Education Details Patient Name: Date of Service: Angela Balzarine, Angela RTHA Y. 2/21/2023andnbsp1:00 PM Medical Record Number: [id number] Patient Account Number: [id number] Date of Birth/Gender: Treating RN: 1956/01/26 (48 [name] [name] Primary Care Physician: [name] Other Clinician: Referring Physician: Treating Physician/Extender: Rudi Heap in Treatment: 1 Education Assessment Education Provided To: Patient Education Topics Provided Wound/Skin Impairment: Methods: Explain/Verbal Responses: Return demonstration correctly Electronic Signature(s) Signed: 01/24/2022 4:24:40 PM By: Dellie Catholic RN Entered By: Dellie Catholic on 01/24/2022 16:20:29 -------------------------------------------------------------------------------- Wound Assessment Details Patient Name: Date of Service: [name], Angela Y. 01/24/2022 1:00 PM Medical Record Number: [id number] Patient Account Number: [id number] Date of Birth/Sex: Treating RN: 1956-11-21 (21 [name] [name] Primary Care Johnette Teigen: [name] Other Clinician: Referring Angalina Ante: Treating Timmya Blazier/Extender: Rudi Heap in Treatment: 1 Wound Status Wound Number: 22 Primary Etiology: Pressure Ulcer Wound Location: Left Calcaneus Wound Status: Open Wounding Event: Pressure Injury Comorbid History: Cataracts, Hypotension, Neuropathy, Quadriplegia Date  Acquired: 11/03/2021 Weeks Of Treatment: 1 Clustered Wound: No Photos Wound Measurements Length: (cm) 3.8 Width: (cm) 2 Depth: (cm) 0.3 Area: (cm) 5.969 Volume: (cm) 1.791 % Reduction in Area: 5% % Reduction in Volume: 5% Epithelialization: None Tunneling: No Undermining: No Wound Description Classification: Category/Stage III Wound Margin: Thickened Exudate Amount: Large Exudate Type: Serosanguineous Exudate Color: red, brown Foul Odor After Cleansing: No Slough/Fibrino Yes Wound Bed Granulation Amount: Medium (34-66%) Exposed Structure Granulation Quality: Pink Fascia Exposed: No Necrotic Amount: Medium (34-66%) Fat Layer (Subcutaneous Tissue) Exposed: Yes Necrotic Quality: Adherent Slough Tendon Exposed: No Muscle Exposed: No Joint Exposed: No Bone Exposed: No Treatment Notes Wound #22 (Calcaneus) Wound Laterality: Left Cleanser Soap and Water Discharge Instruction: May shower and wash wound with dial antibacterial soap and water prior to dressing change. Wound Cleanser Discharge Instruction: Cleanse the wound with wound cleanser prior to applying a clean dressing using gauze sponges, not tissue or cotton balls. Peri-Wound Care Topical Triamcinolone Discharge Instruction: Apply TCA around wound Keystone antibiotics Discharge Instruction: thin layer on wound bed under hydrofera Primary Dressing Hydrofera Blue Ready Foam, 4x5 in Discharge Instruction: Apply to wound bed as instructed Secondary Dressing Woven Gauze Sponge, Non-Sterile 4x4 in Discharge Instruction: Apply over primary dressing  as directed. Zetuvit Plus Silicone Border Dressing 7x7(in/in) Discharge Instruction: Apply silicone border over primary dressing as directed. Secured With 79M Medipore H Soft Cloth Surgical T ape, 4 x 10 (in/yd) Discharge Instruction: Secure with tape as directed. Compression Wrap Compression Stockings Add-Ons Electronic Signature(s) Signed: 01/24/2022 4:24:40 PM By:  Dellie Catholic RN Entered By: Dellie Catholic on 01/24/2022 13:25:26 -------------------------------------------------------------------------------- Wound Assessment Details Patient Name: Date of Service: [name], [place] Y. 01/24/2022 1:00 PM Medical Record Number: [id number] Patient Account Number: [id number] Date of Birth/Sex: Treating RN: Jun 28, 1956 (44 [name] [name] Primary Care Fia Hebert: [name] Other Clinician: Referring Taye Cato: Treating Cartier Washko/Extender: Rudi Heap in Treatment: 1 Wound Status Wound Number: 23 Primary Etiology: Pressure Ulcer Wound Location: Left, Posterior Ankle Wound Status: Open Wounding Event: Pressure Injury Comorbid History: Cataracts, Hypotension, Neuropathy, Quadriplegia Date Acquired: 11/03/2021 Weeks Of Treatment: 1 Clustered Wound: No Photos Wound Measurements Length: (cm) 3 Width: (cm) 1.7 Depth: (cm) 0.4 Area: (cm) 4.006 Volume: (cm) 1.602 % Reduction in Area: 5.5% % Reduction in Volume: 5.5% Epithelialization: None Tunneling: No Undermining: No Wound Description Classification: Category/Stage III Exudate Amount: Large Exudate Type: Serosanguineous Exudate Color: red, brown Foul Odor After Cleansing: No Slough/Fibrino Yes Wound Bed Granulation Amount: Medium (34-66%) Exposed Structure Granulation Quality: Red Fascia Exposed: No Necrotic Amount: Medium (34-66%) Fat Layer (Subcutaneous Tissue) Exposed: Yes Necrotic Quality: Adherent Slough Tendon Exposed: No Muscle Exposed: No Joint Exposed: No Bone Exposed: No Treatment Notes Wound #23 (Ankle) Wound Laterality: Left, Posterior Cleanser Soap and Water Discharge Instruction: May shower and wash wound with dial antibacterial soap and water prior to dressing change. Wound Cleanser Discharge Instruction: Cleanse the wound with wound cleanser prior to applying a clean dressing using gauze sponges, not tissue or cotton  balls. Peri-Wound Care Topical Triamcinolone Discharge Instruction: Apply TCA around wound Keystone antibiotics Discharge Instruction: thin layer on wound bed under hydrofera Primary Dressing Hydrofera Blue Ready Foam, 4x5 in Discharge Instruction: Apply to wound bed as instructed Secondary Dressing Woven Gauze Sponge, Non-Sterile 4x4 in Discharge Instruction: Apply over primary dressing as directed. Zetuvit Plus Silicone Border Dressing 7x7(in/in) Discharge Instruction: Apply silicone border over primary dressing as directed. Secured With 79M Medipore H Soft Cloth Surgical T ape, 4 x 10 (in/yd) Discharge Instruction: Secure with tape as directed. Compression Wrap Compression Stockings Add-Ons Electronic Signature(s) Signed: 01/24/2022 4:24:40 PM By: Dellie Catholic RN Entered By: Dellie Catholic on 01/24/2022 13:27:32 -------------------------------------------------------------------------------- Vitals Details Patient Name: Date of Service: [name], Angela Y. 01/24/2022 1:00 PM Medical Record Number: [id number] Patient Account Number: [id number] Date of Birth/Sex: Treating RN: 05-21-1956 (51 [name] [name] Primary Care Ibrahem Volkman: [name] Other Clinician: Referring Wentworth Edelen: Treating Tahiry Spicer/Extender: Rudi Heap in Treatment: 1 Vital Signs Time Taken: 13:06 Temperature (F): 97.7 Height (in): 64 Pulse (bpm): 52 Weight (lbs): 175 Respiratory Rate (breaths/min): 16 Body Mass Index (BMI): 30 Blood Pressure (mmHg): 117/59 Reference Range: 80 - 120 mg / dl Electronic Signature(s) Signed: 01/24/2022 4:24:40 PM By: Dellie Catholic RN Entered By: Dellie Catholic on 01/24/2022 13:07:09

## 2022-01-24 NOTE — Progress Notes (Signed)
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 controlled with Pressure. The procedure was tolerated well with a pain level of 0 throughout and a pain level of 0 following the procedure. Post Debridement Measurements: 3cm length x 1.7cm width x 0.4cm depth; 1.602cm^3 volume. Post debridement Stage noted as Category/Stage III. Character of Wound/Ulcer Post Debridement is improved. Post procedure Diagnosis Wound #23: Same as Pre-Procedure Plan Follow-up Appointments: Return Appointment in 2 weeks. - Dr Alijah Akram/Dr Celine Ahr Off-Loading: Heel suspension boot to: - Wear the Prevalon boots to both feet Home Health: New wound care orders this week; continue Home Health for wound care. May utilize formulary equivalent dressing for wound treatment orders unless otherwise specified. - add hydrofera blue over keystone anitbiotic change every other day Other Home Health Orders/Instructions: [name] WOUND #22: - Calcaneus Wound Laterality: Left Cleanser: Soap and Water Every Other Day/30 Days Discharge  Instructions: May shower and wash wound with dial antibacterial soap and water prior to dressing change. Cleanser: Wound Cleanser Every Other Day/30 Days Discharge Instructions: Cleanse the wound with wound cleanser prior to applying a clean dressing using gauze sponges, not tissue or cotton balls. Topical: Triamcinolone Every Other Day/30 Days Discharge Instructions: Apply TCA around wound Topical: Keystone antibiotics Every Other Day/30 Days Discharge Instructions: thin layer on wound bed under hydrofera Prim Dressing: Hydrofera Blue Ready Foam, 4x5 in Every Other Day/30 Days ary Discharge Instructions: Apply to wound bed as instructed Secondary Dressing: Woven Gauze Sponge, Non-Sterile 4x4 in Every Other Day/30 Days Discharge Instructions: Apply over primary dressing as directed. Secondary Dressing: Zetuvit Plus Silicone Border Dressing 7x7(in/in) Every Other Day/30 Days Discharge Instructions: Apply silicone border over primary dressing as directed. Secured With: 79M Medipore H Soft Cloth Surgical T ape, 4 x 10 (in/yd) Every Other Day/30 Days Discharge Instructions: Secure with tape as directed. WOUND #23: - Ankle Wound Laterality: Left, Posterior Cleanser: Soap and Water Every Other Day/30 Days Discharge Instructions: May shower and wash wound with dial antibacterial soap and water prior to dressing change. Cleanser: Wound Cleanser Every Other Day/30 Days Discharge Instructions: Cleanse the wound with wound cleanser prior to applying a clean dressing using gauze sponges, not tissue or cotton balls. Topical: Triamcinolone Every Other Day/30 Days Discharge Instructions: Apply TCA around wound Topical: Keystone antibiotics Every Other Day/30 Days Discharge Instructions: thin layer on wound bed under hydrofera Prim Dressing: Hydrofera Blue Ready Foam, 4x5 in Every Other Day/30 Days ary Discharge Instructions: Apply to wound bed as instructed Secondary Dressing: Woven Gauze Sponge,  Non-Sterile 4x4 in Every Other Day/30 Days Discharge Instructions: Apply over primary dressing as directed. Secondary Dressing: Zetuvit Plus Silicone Border Dressing 7x7(in/in) Every Other Day/30 Days Discharge Instructions: Apply silicone border over primary dressing as directed. Secured With: 79M Medipore H Soft Cloth Surgical T ape, 4 x 10 (in/yd) Every Other Day/30 Days Discharge Instructions: Secure with tape as directed. #1 still using the [facility] antibiotic was prescribed before she came here and topical Hydrofera Blue #2 wound surfaces looked better #3 no evidence  of surrounding soft tissue infection Electronic Signature(s) Signed: 01/24/2022 4:44:59 PM By: Angela Ham MD Entered By: [name] on 01/24/2022 13:46:32 -------------------------------------------------------------------------------- SuperBill Details Patient Name: Date of Service: [name], Seymour Y. 01/24/2022 Medical Record Number: [id number] Patient Account Number: [id number] Date of Birth/Sex: Treating RN: 05/02/1956 (66 y.o. [name] Primary Care Provider: Lorrene Walker Other Clinician: Referring Provider: Treating Provider/Extender: [name] in Treatment: 1 Diagnosis Coding ICD-10 Codes Code Description [phone] Pressure ulcer of left heel, stage 3 L97.828 Non-pressure chronic ulcer of other part of left lower leg with other specified severity S14.109S Unspecified injury at unspecified level of cervical spinal cord, sequela Facility Procedures CPT4 Code: [id number] Description: 83662 - DEB SUBQ TISSUE 20 SQ CM/< ICD-10 Diagnosis Description L89.623 Pressure ulcer of left heel, stage 3 L97.828 Non-pressure chronic ulcer of other part of left lower leg with other specified Modifier: severity Quantity: 1 Physician Procedures : CPT4 Code Description Modifier [id number] - WC PHYS SUBQ TISS 20 SQ CM ICD-10 Diagnosis Description L89.623 Pressure ulcer of left  heel, stage 3 L97.828 Non-pressure chronic ulcer of other part of left lower leg with other specified severity Quantity: 1 Electronic Signature(s) Signed: 01/24/2022 4:24:40 PM By: Angela Catholic RN Signed: 01/24/2022 4:44:59 PM By: Angela Ham MD Entered By: [name] on 01/24/2022 16:21:06

## 2022-02-02 ENCOUNTER — Telehealth: Payer: Self-pay | Admitting: Physician Assistant

## 2022-02-02 NOTE — Telephone Encounter (Signed)
Patient called with INR results 3.6 and again she is on an antibiotic that sprays on her feet. Please advise. [phone] ?

## 2022-02-02 NOTE — Telephone Encounter (Signed)
Patient is aware 

## 2022-02-03 ENCOUNTER — Other Ambulatory Visit: Payer: Self-pay | Admitting: Physician Assistant

## 2022-02-03 DIAGNOSIS — G8250 Quadriplegia, unspecified: Secondary | ICD-10-CM

## 2022-02-06 NOTE — Telephone Encounter (Signed)
Reviewed PDMP profile. Appropriate time. [name] had wanted her to be seen in 6 months, so patient should schedule to be seen sometime in April, especially for further refills.  ?

## 2022-02-07 ENCOUNTER — Encounter (HOSPITAL_BASED_OUTPATIENT_CLINIC_OR_DEPARTMENT_OTHER): Payer: Medicare Other | Attending: General Surgery | Admitting: General Surgery

## 2022-02-07 ENCOUNTER — Other Ambulatory Visit: Payer: Self-pay

## 2022-02-07 DIAGNOSIS — L97828 Non-pressure chronic ulcer of other part of left lower leg with other specified severity: Secondary | ICD-10-CM | POA: Diagnosis not present

## 2022-02-07 DIAGNOSIS — G8250 Quadriplegia, unspecified: Secondary | ICD-10-CM | POA: Insufficient documentation

## 2022-02-07 DIAGNOSIS — B372 Candidiasis of skin and nail: Secondary | ICD-10-CM | POA: Diagnosis not present

## 2022-02-07 DIAGNOSIS — L89623 Pressure ulcer of left heel, stage 3: Secondary | ICD-10-CM | POA: Diagnosis present

## 2022-02-07 DIAGNOSIS — X58XXXS Exposure to other specified factors, sequela: Secondary | ICD-10-CM | POA: Diagnosis not present

## 2022-02-07 NOTE — Progress Notes (Addendum)
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 Temperature: 98.1 F, Pulse: 61 bpm, Respiratory Rate: 18 breaths/min, Blood Pressure: 103/62 mmHg. Respiratory Normal work of breathing on room air. General Notes: 02/08/2022: Wound examooThe wounds are may be a little bit smaller. There remains necrotic tissue in the calcaneal wound, along with adherent slough. The Achilles wound is better with just some slough present. I debrided both areas with a #5 curette. There is a bit of periwound erythema but I do not see any overt signs of infection and the patient is systemically well. Integumentary (Hair, Skin) Wound #22 status is Open. Original cause of wound was Pressure Injury. The date acquired was: 11/03/2021. The wound has been in treatment 3 weeks. The wound is located on the Left Calcaneus. The wound measures 3.9cm length x 2cm width x 0.3cm depth; 6.126cm^2 area and 1.838cm^3 volume. There is Fat Layer (Subcutaneous Tissue) exposed. There is no tunneling or undermining noted. There is  a large amount of serosanguineous drainage noted. The wound margin is thickened. There is medium (34-66%) pink granulation within the wound bed. There is a medium (34-66%) amount of necrotic tissue within the wound bed including Adherent Slough. Wound #23 status is Open. Original cause of wound was Pressure Injury. The date acquired was: 11/03/2021. The wound has been in treatment 3 weeks. The wound is located on the Left,Posterior Ankle. The wound measures 2.5cm length x 1.7cm width x 0.3cm depth; 3.338cm^2 area and 1.001cm^3 volume. There is Fat Layer (Subcutaneous Tissue) exposed. There is no tunneling or undermining noted. There is a large amount of serosanguineous drainage noted. The wound margin is flat and intact. There is medium (34-66%) red granulation within the wound bed. There is a medium (34-66%) amount of necrotic tissue within the wound bed including Adherent Slough. 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 There was a Excisional Skin/Subcutaneous Tissue Debridement with a total area of 7.8 sq cm performed by Fredirick Maudlin, MD. With the following instrument(s): Curette to remove Viable and Non-Viable tissue/material. Material removed includes Eschar, Subcutaneous Tissue, Slough, Skin: Dermis, and Skin: Epidermis after achieving pain control using Lidocaine 5% topical ointment. 1 specimen was taken by a Tissue Culture and sent to the lab per facility protocol. A time out was conducted at 15:06, prior to the start of the procedure. A Moderate amount of bleeding was controlled with Pressure. The procedure was tolerated well with a pain level of 0 throughout and a pain level of  0 following the procedure. Post Debridement Measurements: 3.9cm length x 2cm width x 0.3cm depth; 1.838cm^3 volume. Post debridement Stage noted as Category/Stage III. Character of Wound/Ulcer Post Debridement is improved. Post procedure Diagnosis Wound #22: Same as Pre-Procedure Wound #23 Pre-procedure diagnosis of Wound #23 is a Pressure Ulcer located on the Left,Posterior Ankle . There was a Excisional Skin/Subcutaneous  Tissue Debridement with a total area of 4.25 sq cm performed by Fredirick Maudlin, MD. With the following instrument(s): Curette to remove Viable and Non-Viable tissue/material. Material removed includes Eschar, Subcutaneous Tissue, Slough, Skin: Dermis, and Skin: Epidermis after achieving pain control using Lidocaine 5% topical ointment. No specimens were taken. A time out was conducted at 15:06, prior to the start of the procedure. A Moderate amount of bleeding was controlled with Pressure. The procedure was tolerated well with a pain level of 0 throughout and a pain level of 0 following the procedure. Post Debridement Measurements: 2.5cm length x 1.78cm width x 0.3cm depth; 1.049cm^3 volume. Post debridement Stage noted as Category/Stage III. Character of Wound/Ulcer Post Debridement is improved. Post procedure Diagnosis Wound #23: Same as Pre-Procedure Plan The following medication(s) was prescribed: triamcinolone acetonide topical 0.1 % ointment apply thin layer to periwound skin with each dressing change starting 02/07/2022 02/08/2022: This is my first encounter with this patient; she was previously followed by Dr. Dellia Nims. The wounds are may be a little bit smaller. There remains necrotic tissue in the calcaneal wound, along with adherent slough. The Achilles wound is better with just some slough present. There is some mild periwound erythema, but no overt signs of infection. Both wounds were debrided with a #5 curette. The patient was concerned that there was not a recent  culture and therefore I took 1 for PCR. This may reveal that the [facility] antibiotic is no longer required or may suggest a different compound antibiotic may be more beneficial. She also expressed concern with how the home health nurses were changing her dressing. I reiterated to her that the [facility] antibiotic should be liberally applied to the wound site prior to applying Hydrofera Blue. The Hydrofera Blue should be cut to size and there should be little to no overlap on the surrounding skin. Due to the mild periwound erythema, I am going to add triamcinolone to try and reduce the inflammation. If this is not better or worsens at her next visit, she may need a systemic antibiotic. I will await the PCR data and make any necessary adjustments. I will see her back in 2 weeks. Electronic Signature(s) Signed: 02/07/2022 3:16:14 PM By: Fredirick Maudlin MD FACS Entered By: Fredirick Maudlin on 02/07/2022 15:16:13 -------------------------------------------------------------------------------- HxROS Details Patient Name: Date of Service: [name], [name] 02/07/2022 2:15 PM Medical Record Number: [id number] Patient Account Number: [id number] Date of Birth/Sex: Treating RN: 1956/05/12 (66 y.o. 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 Suprapubic catheter Musculoskeletal Medical History: Past Medical  History Notes: Osteoporosis, decubitis ulcer of sacral region (stage 1); Neurologic Medical History: Positive for: Neuropathy - arm; Quadriplegia Psychiatric Medical History: Negative for: Confinement Anxiety HBO Extended History Items Eyes: Cataracts Immunizations Pneumococcal Vaccine: Received Pneumococcal Vaccination: Yes Received Pneumococcal Vaccination On or After 60th Birthday: No Tetanus Vaccine: Last tetanus shot: 12/04/2010 Implantable Devices No devices added Family and Social History Unknown History: Yes Physician Affirmation I have reviewed and agree with the above information. Electronic Signature(s) Signed: 02/07/2022 5:26:37 PM By: Fredirick Maudlin MD FACS Entered  By: Fredirick Maudlin on 02/07/2022 15:10:30 -------------------------------------------------------------------------------- SuperBill Details Patient Name: Date of Service: [facility] [place] Y. 02/07/2022 Medical Record Number: [id number] Patient Account Number: [id number] Date of Birth/Sex: Treating RN: Dec 07, 1955 (66 y.o. Female) Primary Care Provider: [name] Other Clinician: Referring Provider: Treating Provider/Extender: Lourena Simmonds Weeks in Treatment: 3 Diagnosis Coding ICD-10 Codes Code Description [id number] Pressure ulcer of left heel, stage 3 L97.828 Non-pressure chronic ulcer of other part of left lower leg with other specified severity S14.109S Unspecified injury at unspecified level of cervical spinal cord, sequela Facility Procedures CPT4 Code: [id number] Description: 91694 - DEB SUBQ TISSUE 20 SQ CM/< ICD-10 Diagnosis Description L89.623 Pressure ulcer of left heel, stage 3 L97.828 Non-pressure chronic ulcer of other part of left lower leg with other specified Modifier: severity Quantity: 1 Physician Procedures : CPT4 Code Description Modifier [id number] 80034 - WC PHYS LEVEL 4 - EST PT ICD-10 Diagnosis Description L89.623 Pressure ulcer of left heel, stage 3  L97.828 Non-pressure chronic ulcer of other part of left lower leg with other specified severity Quantity: 1 : [id number] 11042 - WC PHYS SUBQ TISS 20 SQ CM ICD-10 Diagnosis Description L89.623 Pressure ulcer of left heel, stage 3 L97.828 Non-pressure chronic ulcer of other part of left lower leg with other specified severity Quantity: 1 Electronic Signature(s) Signed: 02/07/2022 3:16:38 PM By: Fredirick Maudlin MD FACS Entered By: Fredirick Maudlin on 02/07/2022 15:16:37

## 2022-02-07 NOTE — Progress Notes (Signed)
[name], [name] ([id number]) Visit Report for 02/07/2022 Arrival Information Details Patient Name: Date of Service: [place], [name] 02/07/2022 2:15 PM Medical Record Number: [id number] Patient Account Number: [id number] Date of Birth/Sex: Treating RN: 08-May-1956 (66 y.o. [name] Primary Care [name]: [name] Other Clinician: Referring [name]: Treating [name]/Extender: [name] in Treatment: 3 Visit Information History Since Last Visit Added or deleted any medications: No Patient Arrived: Wheel Chair Any new allergies or adverse reactions: No Arrival Time: 14:40 Had a fall or experienced change in No Transfer Assistance: None activities of daily living that may affect Patient Identification Verified: Yes risk of falls: Secondary Verification Process Completed: Yes Signs or symptoms of abuse/neglect since last visito No Patient Has Alerts: Yes Hospitalized since last visit: No Patient Alerts: Patient on Blood Thinner Implantable device outside of the clinic excluding No Coumadin cellular tissue based products placed in the center since last visit: Has Dressing in Place as Prescribed: Yes Pain Present Now: No Electronic Signature(s) Signed: 02/07/2022 5:02:09 PM By: Angela Creamer RN, BSN Entered By: [name] on 02/07/2022 14:40:45 -------------------------------------------------------------------------------- Encounter Discharge Information Details Patient Name: Date of Service: [name], [place]. 02/07/2022 2:15 PM Medical Record Number: [id number] Patient Account Number: [id number] Date of Birth/Sex: Treating RN: Sep 22, 1956 (66 y.o. [name] Primary Care [name]: [name] Other Clinician: Referring [name]: Treating [name]/Extender: [name] in Treatment: 3 Encounter Discharge Information Items Post Procedure Vitals Discharge Condition: Stable Temperature (F):  98.1 Ambulatory Status: Wheelchair Pulse (bpm): 61 Discharge Destination: Home Respiratory Rate (breaths/min): 18 Transportation: Private Auto Blood Pressure (mmHg): 103/62 Accompanied By: sister Schedule Follow-up Appointment: Yes Clinical Summary of Care: Patient Declined Electronic Signature(s) Signed: 02/07/2022 6:13:43 PM By: Angela Catholic RN Entered By: [name] on 02/07/2022 18:13:06 -------------------------------------------------------------------------------- Lower Extremity Assessment Details Patient Name: Date of Service: [place], [place] Y. 02/07/2022 2:15 PM Medical Record Number: [id number] Patient Account Number: [id number] Date of Birth/Sex: Treating RN: Sep 21, 1956 (66 y.o. [name] Primary Care [name]: [name] Other Clinician: Referring Carzell Saldivar: Treating [name]/Extender: [name] Weeks in Treatment: 3 Edema Assessment Assessed: [Left: No] [Right: No] E[Left: dema] [Right: :] Calf Left: Right: Point of Measurement: 31 cm From Medial Instep 26.4 cm Ankle Left: Right: Point of Measurement: 7 cm From Medial Instep 20.4 cm Vascular Assessment Pulses: Dorsalis Pedis Palpable: [Left:Yes] Electronic Signature(s) Signed: 02/07/2022 5:02:09 PM By: Angela Creamer RN, BSN Entered By: [name] on 02/07/2022 14:43:47 -------------------------------------------------------------------------------- Multi Wound Chart Details Patient Name: Date of Service: Angela Balzarine, [name] 02/07/2022 2:15 PM Medical Record Number: [id number] Patient Account Number: [id number] Date of Birth/Sex: Treating RN: 1956/09/15 (66 y.o. F) Primary Care [name]: [name] Other Clinician: Referring [name]: Treating Farron Lafond/Extender: [name] in Treatment: 3 Vital Signs Height(in): 64 Pulse(bpm): 16 Weight(lbs): 175 Blood Pressure(mmHg): 103/62 Body Mass Index(BMI): 30 Temperature(F):  98.1 Respiratory Rate(breaths/min): 18 Photos: [22:Left Calcaneus] [23:Left, Posterior Ankle] [N/A:N/A N/A] Wound Location: [22:Pressure Injury] [23:Pressure Injury] [N/A:N/A] Wounding Event: [22:Pressure Ulcer] [23:Pressure Ulcer] [N/A:N/A] Primary Etiology: [22:Cataracts, Hypotension, Neuropathy, Cataracts, Hypotension, Neuropathy, N/A] Comorbid History: [22:Quadriplegia 11/03/2021] [23:Quadriplegia 11/03/2021] [N/A:N/A] Date Acquired: [22:3] [23:3] [N/A:N/A] Weeks of Treatment: [22:Open] [23:Open] [N/A:N/A] Wound Status: [22:No] [23:No] [N/A:N/A] Wound Recurrence: [22:3.9x2x0.3] [23:2.5x1.7x0.3] [N/A:N/A] Measurements L x W x D (cm) [22:6.126] [23:3.338] [N/A:N/A] A (cm) : rea [22:1.838] [23:1.001] [N/A:N/A] Volume (cm) : [22:2.50%] [23:21.30%] [N/A:N/A] % Reduction in A [22:rea: 2.50%] [23:41.00%] [N/A:N/A] % Reduction in Volume: [22:Category/Stage III] [  23:Category/Stage III] [N/A:N/A] Classification: [22:Large] [23:Large] [N/A:N/A] Exudate A mount: [22:Serosanguineous] [23:Serosanguineous] [N/A:N/A] Exudate Type: [22:red, Walker] [23:red, Walker] [N/A:N/A] Exudate Color: [22:Thickened] [23:Flat and Intact] [N/A:N/A] Wound Margin: [22:Medium (34-66%)] [23:Medium (34-66%)] [N/A:N/A] Granulation A mount: [22:Pink] [23:Red] [N/A:N/A] Granulation Quality: [22:Medium (34-66%)] [23:Medium (34-66%)] [N/A:N/A] Necrotic A mount: [22:Fat Layer (Subcutaneous Tissue): Yes Fat Layer (Subcutaneous Tissue): Yes N/A] Exposed Structures: [22:Fascia: No Tendon: No Muscle: No Joint: No Bone: No None] [23:Fascia: No Tendon: No Muscle: No Joint: No Bone: No None] [N/A:N/A] Epithelialization: [22:Debridement - Excisional] [23:Debridement - Excisional] [N/A:N/A] Debridement: Pre-procedure Verification/Time Out 15:06 [08:65:78] [N/A:N/A] Taken: [22:Lidocaine 5% topical ointment] [23:Lidocaine 5% topical ointment] [N/A:N/A] Pain Control: [22:Necrotic/Eschar, Subcutaneous,] [23:Necrotic/Eschar,  Subcutaneous,] [N/A:N/A] Tissue Debrided: [22:Slough Skin/Subcutaneous Tissue] [23:Slough Skin/Subcutaneous Tissue] [N/A:N/A] Level: [22:7.8] [23:4.25] [N/A:N/A] Debridement A (sq cm): [22:rea Curette] [23:Curette] [N/A:N/A] Instrument: [22:Moderate] [23:Moderate] [N/A:N/A] Bleeding: [22:Pressure] [23:Pressure] [N/A:N/A] Hemostasis Achieved: [22:0] [23:0] [N/A:N/A] Procedural Pain: [22:0] [23:0] [N/A:N/A] Post Procedural Pain: Debridement Treatment Response: Procedure was tolerated well [23:Procedure was tolerated well] [N/A:N/A] Post Debridement Measurements L x 3.9x2x0.3 [23:2.5x1.78x0.3] [N/A:N/A] W x D (cm) [22:1.838] [23:1.049] [N/A:N/A] Post Debridement Volume: (cm) [22:Category/Stage III] [23:Category/Stage III] [N/A:N/A] Post Debridement Stage: [22:Debridement] [23:N/A] [N/A:N/A] Treatment Notes Electronic Signature(s) Signed: 02/07/2022 3:08:01 PM By: Fredirick Maudlin MD FACS Entered By: Fredirick Maudlin on 02/07/2022 15:08:01 -------------------------------------------------------------------------------- Multi-Disciplinary Care Plan Details Patient Name: Date of Service: Angela Balzarine, [name] 02/07/2022 2:15 PM Medical Record Number: [id number] Patient Account Number: [id number] Date of Birth/Sex: Treating RN: Oct 17, 1956 (66 y.o. [name] Primary Care Summers Buendia: [name] Other Clinician: Referring Danyla Wattley: Treating Sharlett Lienemann/Extender: [name] Weeks in Treatment: 3 Active Inactive Wound/Skin Impairment Nursing Diagnoses: Impaired tissue integrity Knowledge deficit related to smoking impact on wound healing Goals: Patient/caregiver will verbalize understanding of skin care regimen Date Initiated: 01/12/2022 Target Resolution Date: 03/09/2022 Goal Status: Active Interventions: Assess patient/caregiver ability to obtain necessary supplies Assess patient/caregiver ability to perform ulcer/skin care regimen upon admission and as  needed Assess ulceration(s) every visit Provide education on smoking Provide education on ulcer and skin care Notes: Electronic Signature(s) Signed: 02/07/2022 6:13:43 PM By: Angela Catholic RN Entered By: [name] on 02/07/2022 18:11:11 -------------------------------------------------------------------------------- Pain Assessment Details Patient Name: Date of Service: [name], [name] 02/07/2022 2:15 PM Medical Record Number: [id number] Patient Account Number: [id number] Date of Birth/Sex: Treating RN: Jul 29, 1956 (66 y.o. [name] Primary Care Dillyn Menna: [name] Other Clinician: Referring Rylen Swindler: Treating Darra Rosa/Extender: [name] Weeks in Treatment: 3 Active Problems Location of Pain Severity and Description of Pain Patient Has Paino No Site Locations Pain Management and Medication Current Pain Management: Electronic Signature(s) Signed: 02/07/2022 5:02:09 PM By: Angela Creamer RN, BSN Entered By: [name] on 02/07/2022 14:41:47 -------------------------------------------------------------------------------- Patient/Caregiver Education Details Patient Name: Date of Service: Angela Balzarine, [place] Cheryln [place] 3/7/2023andnbsp2:15 PM Medical Record Number: [id number] Patient Account Number: [id number] Date of Birth/Gender: Treating RN: 20-Feb-1956 (66 y.o. [name] Primary Care Physician: [name] Other Clinician: Referring Physician: Treating Physician/Extender: [name] in Treatment: 3 Education Assessment Education Provided To: Patient Education Topics Provided Smoking and Wound Healing: Methods: Explain/Verbal Responses: Return demonstration correctly Electronic Signature(s) Signed: 02/07/2022 6:13:43 PM By: Angela Catholic RN Entered By: [name] on 02/07/2022 18:11:28 -------------------------------------------------------------------------------- Wound Assessment  Details Patient Name: Date of Service: [name], [place] Y. 02/07/2022 2:15 PM Medical Record Number: [id number] Patient Account Number: [id number] Date of Birth/Sex: Treating RN: 03-07-1956 (66 y.o. F) Primary Care Pauletta Pickney: [name] Other Clinician: Referring  Yeraldi Fidler: Treating Dione Petron/Extender: [name] Weeks in Treatment: 3 Wound Status Wound Number: 22 Primary Etiology: Pressure Ulcer Wound Location: Left Calcaneus Wound Status: Open Wounding Event: Pressure Injury Comorbid History: Cataracts, Hypotension, Neuropathy, Quadriplegia Date Acquired: 11/03/2021 Weeks Of Treatment: 3 Clustered Wound: No Photos Wound Measurements Length: (cm) 3.9 Width: (cm) 2 Depth: (cm) 0.3 Area: (cm) 6.126 Volume: (cm) 1.838 % Reduction in Area: 2.5% % Reduction in Volume: 2.5% Epithelialization: None Tunneling: No Undermining: No Wound Description Classification: Category/Stage III Wound Margin: Thickened Exudate Amount: Large Exudate Type: Serosanguineous Exudate Color: red, Walker Foul Odor After Cleansing: No Slough/Fibrino Yes Wound Bed Granulation Amount: Medium (34-66%) Exposed Structure Granulation Quality: Pink Fascia Exposed: No Necrotic Amount: Medium (34-66%) Fat Layer (Subcutaneous Tissue) Exposed: Yes Necrotic Quality: Adherent Slough Tendon Exposed: No Muscle Exposed: No Joint Exposed: No Bone Exposed: No Treatment Notes Wound #22 (Calcaneus) Wound Laterality: Left Cleanser Soap and Water Discharge Instruction: May shower and wash wound with dial antibacterial soap and water prior to dressing change. Wound Cleanser Discharge Instruction: Cleanse the wound with wound cleanser prior to applying a clean dressing using gauze sponges, not tissue or cotton balls. Peri-Wound Care Topical Triamcinolone Discharge Instruction: Apply TCA around wound Keystone antibiotics Discharge Instruction: thin layer on wound bed under  hydrofera Primary Dressing Hydrofera Blue Classic Foam, 2x2 in Discharge Instruction: Moisten with saline prior to applying to wound bed Secondary Dressing Woven Gauze Sponge, Non-Sterile 4x4 in Discharge Instruction: Apply over primary dressing as directed. Zetuvit Plus Silicone Border Dressing 7x7(in/in) Discharge Instruction: Apply silicone border over primary dressing as directed. Secured With 71M Medipore H Soft Cloth Surgical T ape, 4 x 10 (in/yd) Discharge Instruction: Secure with tape as directed. Compression Wrap Compression Stockings Add-Ons Electronic Signature(s) Signed: 02/07/2022 5:02:09 PM By: Angela Creamer RN, BSN Entered By: [name] on 02/07/2022 14:44:42 -------------------------------------------------------------------------------- Wound Assessment Details Patient Name: Date of Service: [name], [place] Y. 02/07/2022 2:15 PM Medical Record Number: [id number] Patient Account Number: [id number] Date of Birth/Sex: Treating RN: [birth year] (66 y.o. F) Primary Care Arianny Pun: Other Clinician: Lorrene Walker Referring Wilna Pennie: Treating Nevaan Bunton/Extender: [name] Weeks in Treatment: 3 Wound Status Wound Number: 23 Primary Etiology: Pressure Ulcer Wound Location: Left, Posterior Ankle Wound Status: Open Wounding Event: Pressure Injury Comorbid History: Cataracts, Hypotension, Neuropathy, Quadriplegia Date Acquired: 11/03/2021 Weeks Of Treatment: 3 Clustered Wound: No Photos Wound Measurements Length: (cm) 2.5 Width: (cm) 1.7 Depth: (cm) 0.3 Area: (cm) 3.338 Volume: (cm) 1.001 % Reduction in Area: 21.3% % Reduction in Volume: 41% Epithelialization: None Tunneling: No Undermining: No Wound Description Classification: Category/Stage III Wound Margin: Flat and Intact Exudate Amount: Large Exudate Type: Serosanguineous Exudate Color: red, Walker Foul Odor After Cleansing: No Slough/Fibrino Yes Wound Bed Granulation  Amount: Medium (34-66%) Exposed Structure Granulation Quality: Red Fascia Exposed: No Necrotic Amount: Medium (34-66%) Fat Layer (Subcutaneous Tissue) Exposed: Yes Necrotic Quality: Adherent Slough Tendon Exposed: No Muscle Exposed: No Joint Exposed: No Bone Exposed: No Treatment Notes Wound #23 (Ankle) Wound Laterality: Left, Posterior Cleanser Soap and Water Discharge Instruction: May shower and wash wound with dial antibacterial soap and water prior to dressing change. Wound Cleanser Discharge Instruction: Cleanse the wound with wound cleanser prior to applying a clean dressing using gauze sponges, not tissue or cotton balls. Peri-Wound Care Topical Triamcinolone Discharge Instruction: Apply TCA around wound Keystone antibiotics Discharge Instruction: thin layer on wound bed under hydrofera Primary Dressing Hydrofera Blue Ready Foam, 4x5 in Discharge Instruction: Apply to wound bed as instructed Secondary  Dressing Woven Gauze Sponge, Non-Sterile 4x4 in Discharge Instruction: Apply over primary dressing as directed. Zetuvit Plus Silicone Border Dressing 7x7(in/in) Discharge Instruction: Apply silicone border over primary dressing as directed. Secured With 54M Medipore H Soft Cloth Surgical T ape, 4 x 10 (in/yd) Discharge Instruction: Secure with tape as directed. Compression Wrap Compression Stockings Add-Ons Electronic Signature(s) Signed: 02/07/2022 5:02:09 PM By: Angela Creamer RN, BSN Entered By: [name] on 02/07/2022 14:45:30 -------------------------------------------------------------------------------- Vitals Details Patient Name: Date of Service: [name], [place] Y. 02/07/2022 2:15 PM Medical Record Number: [id number] Patient Account Number: [id number] Date of Birth/Sex: Treating RN: November 05, 1956 (66 y.o. [name] Primary Care Juliona Vales: [name] Other Clinician: Referring Shacara Cozine: Treating Andrian Urbach/Extender: [name] in Treatment: 3 Vital Signs Time Taken: 14:30 Temperature (F): 98.1 Height (in): 64 Pulse (bpm): 61 Weight (lbs): 175 Respiratory Rate (breaths/min): 18 Body Mass Index (BMI): 30 Blood Pressure (mmHg): 103/62 Reference Range: 80 - 120 mg / dl Electronic Signature(s) Signed: 02/07/2022 5:02:09 PM By: Angela Creamer RN, BSN Entered By: [name] on 02/07/2022 14:41:39

## 2022-02-20 ENCOUNTER — Encounter (HOSPITAL_BASED_OUTPATIENT_CLINIC_OR_DEPARTMENT_OTHER): Payer: Medicare Other | Admitting: General Surgery

## 2022-02-20 ENCOUNTER — Other Ambulatory Visit: Payer: Self-pay

## 2022-02-20 DIAGNOSIS — L89623 Pressure ulcer of left heel, stage 3: Secondary | ICD-10-CM | POA: Diagnosis not present

## 2022-02-20 LAB — POCT INR: INR: 2.6 — AB (ref 0.80–1.20)

## 2022-02-20 NOTE — Progress Notes (Signed)
[place], Buena. ([id number]) ?Visit Report for 02/20/2022 ?Arrival Information Details ?Patient Name: Date of Service: ?[facility], [name] 02/20/2022 2:45 PM ?Medical Record Number: [id number] ?Patient Account Number: [id number] ?Date of Birth/Sex: Treating RN: ?1956-06-15 (66 y.o. F) Boehlein, Linda ?Primary Care Jennetta Flood: [name] Other Clinician: ?Referring Krissa Utke: ?Treating Greidy Sherard/Extender: Fredirick Maudlin ?[name] ?Weeks in Treatment: 5 ?Visit Information History Since Last Visit ?Added or deleted any medications: No ?Patient Arrived: Wheel Chair ?Any new allergies or adverse reactions: No ?Arrival Time: 15:14 ?Had a fall or experienced change in No ?Accompanied By: self ?activities of daily living that may affect ?Transfer Assistance: None ?risk of falls: ?Patient Has Alerts: Yes ?Signs or symptoms of abuse/neglect since last visito No ?Patient Alerts: Patient on Blood Thinner ?Hospitalized since last visit: No ?Coumadin ?Implantable device outside of the clinic excluding No ?cellular tissue based products placed in the center ?since last visit: ?Has Dressing in Place as Prescribed: Yes ?Pain Present Now: No ?Notes ?stays in chair ?Electronic Signature(s) ?Signed: 02/20/2022 6:11:59 PM By: Baruch Gouty RN, BSN ?Entered By: Baruch Gouty on 02/20/2022 15:16:30 ?-------------------------------------------------------------------------------- ?Encounter Discharge Information Details ?Patient Name: Date of Service: ?[facility], [name] 02/20/2022 2:45 PM ?Medical Record Number: [id number] ?Patient Account Number: [id number] ?Date of Birth/Sex: Treating RN: ?12-01-1956 (66 y.o. F) Boehlein, Linda ?Primary Care Winnifred Dufford: [name] Other Clinician: ?Referring Sumedh Shinsato: ?Treating Shadia Larose/Extender: Fredirick Maudlin ?[name] ?Weeks in Treatment: 5 ?Encounter Discharge Information Items Post Procedure Vitals ?Discharge Condition: Stable ?Temperature (F): 97.6 ?Ambulatory Status:  Wheelchair ?Pulse (bpm): 52 ?Discharge Destination: Home ?Respiratory Rate (breaths/min): 18 ?Transportation: Private Auto ?Blood Pressure (mmHg): 96/51 ?Accompanied By: self ?Schedule Follow-up Appointment: Yes ?Clinical Summary of Care: Patient Declined ?Electronic Signature(s) ?Signed: 02/20/2022 6:11:59 PM By: Baruch Gouty RN, BSN ?Entered By: Baruch Gouty on 02/20/2022 16:08:29 ?-------------------------------------------------------------------------------- ?Lower Extremity Assessment Details ?Patient Name: ?Date of Service: ?[facility], [name] 02/20/2022 2:45 PM ?Medical Record Number: [id number] ?Patient Account Number: [id number] ?Date of Birth/Sex: ?Treating RN: ?[birth year] (66 y.o. F) Boehlein, Linda ?Primary Care Aerilyn Slee: [name] ?Other Clinician: ?Referring Fox Salminen: ?Treating Para Cossey/Extender: Fredirick Maudlin ?[name] ?Weeks in Treatment: 5 ?Edema Assessment ?Assessed: [Left: No] [Right: No] ?Edema: [Left: Ye] [Right: s] ?Calf ?Left: Right: ?Point of Measurement: 31 cm From Medial Instep 26 cm ?Ankle ?Left: Right: ?Point of Measurement: 7 cm From Medial Instep 20.4 cm ?Vascular Assessment ?Pulses: ?Dorsalis Pedis ?Palpable: [Left:Yes] ?Electronic Signature(s) ?Signed: 02/20/2022 6:11:59 PM By: Baruch Gouty RN, BSN ?Entered By: Baruch Gouty on 02/20/2022 15:24:37 ?-------------------------------------------------------------------------------- ?Multi Wound Chart Details ?Patient Name: ?Date of Service: ?[facility], [name] 02/20/2022 2:45 PM ?Medical Record Number: [id number] ?Patient Account Number: [id number] ?Date of Birth/Sex: ?Treating RN: ?August 12, 1956 (66 y.o. F) Boehlein, Linda ?Primary Care Venesha Petraitis: [name] ?Other Clinician: ?Referring Aidyn Sportsman: ?Treating Voshon Petro/Extender: Fredirick Maudlin ?[name] ?Weeks in Treatment: 5 ?Vital Signs ?Height(in): 64 ?Pulse(bpm): 52 ?Weight(lbs): 175 ?Blood Pressure(mmHg): 96/51 ?Body Mass Index(BMI): 30 ?Temperature(??F):  97.6 ?Respiratory Rate(breaths/min): 18 ?Photos: [22:Left Calcaneus] [23:Left, Posterior Ankle] [N/A:N/A N/A] ?Wound Location: [22:Pressure Injury] [23:Pressure Injury] [N/A:N/A] ?Wounding Event: [22:Pressure Ulcer] [23:Pressure Ulcer] [N/A:N/A] ?Primary Etiology: [22:Cataracts, Hypotension, Neuropathy, Cataracts, Hypotension, Neuropathy, N/A] ?Comorbid History: [22:Quadriplegia 11/03/2021] [23:Quadriplegia 11/03/2021] [N/A:N/A] ?Date Acquired: [22:5] [23:5] [N/A:N/A] ?Weeks of Treatment: [22:Open] [23:Open] [N/A:N/A] ?Wound Status: [22:No] [23:No] [N/A:N/A] ?Wound Recurrence: [22:3x2.8x0.1] [23:3.1x2x0.3] [N/A:N/A] ?Measurements L x W x D (cm) [18:8.416] [60:6.301] [N/A:N/A] ?A (cm?) : ?rea [22:0.66] [23:1.461] [N/A:N/A] ?Volume (cm?) : [22:-5.00%] [23:-14.80%] [N/A:N/A] ?% Reduction in A [22:rea: 65.00%] [23:13.90%] [N/A:N/A] ?% Reduction in Volume: [  22:Category/Stage III] [23:Category/Stage III] [N/A:N/A] ?Classification: [22:Medium] [23:Medium] [N/A:N/A] ?Exudate A mount: [22:Serosanguineous] [23:Serosanguineous] [N/A:N/A] ?Exudate Type: [22:red, brown] [23:red, brown] [N/A:N/A] ?Exudate Color: [22:Thickened] [23:Flat and Intact] [N/A:N/A] ?Wound Margin: [22:Small (1-33%)] [23:Medium (34-66%)] [N/A:N/A] ?Granulation A mount: [22:Pink] [23:Red] [N/A:N/A] ?Granulation Quality: [22:Large (67-100%)] [23:Medium (34-66%)] [N/A:N/A] ?Necrotic A mount: [22:Adherent Slough] [23:Eschar, Adherent Slough] [N/A:N/A] ?Necrotic Tissue: ?[22:Fat Layer (Subcutaneous Tissue): Yes Fat Layer (Subcutaneous Tissue): Yes N/A] ?Exposed Structures: ?[22:Fascia: No Tendon: No Muscle: No Joint: No Bone: No None] [23:Fascia: No Tendon: No Muscle: No Joint: No Bone: No None] [N/A:N/A] ?Epithelialization: [22:Debridement - Excisional] [23:Debridement - Excisional] [N/A:N/A] ?Debridement: ?Pre-procedure Verification/Time Out 15:40 [23:15:40] [N/A:N/A] ?Taken: [22:Subcutaneous, Slough] [23:Subcutaneous, Slough] [N/A:N/A] ?Tissue Debrided:  [22:Skin/Subcutaneous Tissue] [23:Skin/Subcutaneous Tissue] [N/A:N/A] ?Level: [22:8.4] [23:6.2] [N/A:N/A] ?Debridement A (sq cm): [22:rea Curette] [23:Curette] [N/A:N/A] ?Instrument: [22:Minimum] [23:Minimum] [N/A:N/A] ?Bleeding: [22:Pressure] [23:Pressure] [N/A:N/A] ?Hemostasis A chieved: [22:Insensate] [23:Insensate] [N/A:N/A] ?Procedural Pain: [22:Insensate] [23:Insensate] [N/A:N/A] ?Post Procedural Pain: [22:Procedure was tolerated well] [23:Procedure was tolerated well] [N/A:N/A] ?Debridement Treatment Response: [22:3x2.8x0.1] [23:3.1x2.8x0.3] [N/A:N/A] ?Post Debridement Measurements L x ?W x D (cm) [22:0.66] [23:2.045] [N/A:N/A] ?Post Debridement Volume: (cm?) [22:Category/Stage III] [23:Category/Stage III] [N/A:N/A] ?Post Debridement Stage: [22:Debridement] [23:Debridement] [N/A:N/A] ?Treatment Notes ?Electronic Signature(s) ?Signed: 02/20/2022 3:51:32 PM By: Fredirick Maudlin MD FACS ?Signed: 02/20/2022 6:11:59 PM By: Baruch Gouty RN, BSN ?Entered By: Fredirick Maudlin on 02/20/2022 15:51:32 ?-------------------------------------------------------------------------------- ?Multi-Disciplinary Care Plan Details ?Patient Name: ?Date of Service: ?[facility], [name] 02/20/2022 2:45 PM ?Medical Record Number: [id number] ?Patient Account Number: [id number] ?Date of Birth/Sex: ?Treating RN: ?Jul 19, 1956 (66 y.o. F) Boehlein, Linda ?Primary Care Hawa Henly: [name] ?Other Clinician: ?Referring Tzippy Testerman: ?Treating Leona Pressly/Extender: Fredirick Maudlin ?[name] ?Weeks in Treatment: 5 ?Multidisciplinary Care Plan reviewed with physician ?Active Inactive ?Wound/Skin Impairment ?Nursing Diagnoses: ?Impaired tissue integrity ?Knowledge deficit related to smoking impact on wound healing ?Goals: ?Patient/caregiver will verbalize understanding of skin care regimen ?Date Initiated: 01/12/2022 ?Target Resolution Date: 03/09/2022 ?Goal Status: Active ?Interventions: ?Assess patient/caregiver ability to obtain necessary  supplies ?Assess patient/caregiver ability to perform ulcer/skin care regimen upon admission and as needed ?Assess ulceration(s) every visit ?Provide education on smoking ?Provide education on ulcer and skin care ?Notes: ?E

## 2022-02-21 ENCOUNTER — Encounter: Payer: Self-pay | Admitting: Physician Assistant

## 2022-02-21 NOTE — Progress Notes (Signed)
[place], [place]. ([id number]) ?Visit Report for 02/20/2022 ?Chief Complaint Document Details ?Patient Name: Date of Service: ?[facility], [place] Angela Y. 02/20/2022 2:45 PM ?Medical Record Number: [id number] ?Patient Account Number: [id number] ?Date of Birth/Sex: Treating RN: ?September 18, 1956 (66 [name]) Boehlein, Linda ?Primary Care Provider: [name] Other Clinician: ?Referring Provider: ?Treating Provider/Extender: Fredirick Maudlin ?[name] ?Weeks in Treatment: 5 ?Information Obtained from: Patient ?Chief Complaint ?Patient is at the clinic for treatment of an open pressure ulcers ?01/12/2022; patient returns to clinic with an area on her left heel and left posterior calf ?Electronic Signature(s) ?Signed: 02/20/2022 3:51:46 PM By: Fredirick Maudlin MD FACS ?Entered By: Fredirick Maudlin on 02/20/2022 15:51:46 ?-------------------------------------------------------------------------------- ?Debridement Details ?Patient Name: Date of Service: ?[facility], [place] Angela Y. 02/20/2022 2:45 PM ?Medical Record Number: [id number] ?Patient Account Number: [id number] ?Date of Birth/Sex: Treating RN: ?Jan 25, 1956 (12 [name]) Boehlein, Linda ?Primary Care Provider: [name] Other Clinician: ?Referring Provider: ?Treating Provider/Extender: Fredirick Maudlin ?[name] ?Weeks in Treatment: 5 ?Debridement Performed for Assessment: Wound #22 Left Calcaneus ?Performed By: Physician Fredirick Maudlin, MD ?Debridement Type: Debridement ?Level of Consciousness (Pre-procedure): Awake and Alert ?Pre-procedure Verification/Time Out Yes - 15:40 ?Taken: ?Start Time: 15:40 ?T Area Debrided (L x W): ?otal 3 (cm) x 2.8 (cm) = 8.4 (cm?) ?Tissue and other material debrided: Viable, Non-Viable, Slough, Subcutaneous, Slough ?Level: Skin/Subcutaneous Tissue ?Debridement Description: Excisional ?Instrument: Curette ?Bleeding: Minimum ?Hemostasis Achieved: Pressure ?Procedural Pain: Insensate ?Post Procedural Pain: Insensate ?Response to Treatment:  Procedure was tolerated well ?Level of Consciousness (Post- Awake and Alert ?procedure): ?Post Debridement Measurements of Total Wound ?Length: (cm) 3 ?Stage: Category/Stage III ?Width: (cm) 2.8 ?Depth: (cm) 0.1 ?Volume: (cm?) 0.66 ?Character of Wound/Ulcer Post Debridement: Requires Further Debridement ?Post Procedure Diagnosis ?Same as Pre-procedure ?Electronic Signature(s) ?Signed: 02/20/2022 6:11:59 PM By: Baruch Gouty RN, BSN ?Signed: 02/21/2022 5:13:50 PM By: Fredirick Maudlin MD FACS ?Entered By: Baruch Gouty on 02/20/2022 15:48:26 ?-------------------------------------------------------------------------------- ?Debridement Details ?Patient Name: ?Date of Service: ?[facility], [place] Angela Y. 02/20/2022 2:45 PM ?Medical Record Number: [id number] ?Patient Account Number: [id number] ?Date of Birth/Sex: ?Treating RN: ?1956/10/06 (55 [name]) Boehlein, Linda ?Primary Care Provider: [name] ?Other Clinician: ?Referring Provider: ?Treating Provider/Extender: Fredirick Maudlin ?[name] ?Weeks in Treatment: 5 ?Debridement Performed for Assessment: Wound #23 Left,Posterior Ankle ?Performed By: Physician Fredirick Maudlin, MD ?Debridement Type: Debridement ?Level of Consciousness (Pre-procedure): Awake and Alert ?Pre-procedure Verification/Time Out Yes - 15:40 ?Taken: ?Start Time: 15:40 ?T Area Debrided (L x W): ?otal 3.1 (cm) x 2 (cm) = 6.2 (cm?) ?Tissue and other material debrided: Viable, Non-Viable, Slough, Subcutaneous, Slough ?Level: Skin/Subcutaneous Tissue ?Debridement Description: Excisional ?Instrument: Curette ?Bleeding: Minimum ?Hemostasis Achieved: Pressure ?Procedural Pain: Insensate ?Post Procedural Pain: Insensate ?Response to Treatment: Procedure was tolerated well ?Level of Consciousness (Post- Awake and Alert ?procedure): ?Post Debridement Measurements of Total Wound ?Length: (cm) 3.1 ?Stage: Category/Stage III ?Width: (cm) 2.8 ?Depth: (cm) 0.3 ?Volume: (cm?) 2.045 ?Character of Wound/Ulcer Post  Debridement: Requires Further Debridement ?Post Procedure Diagnosis ?Same as Pre-procedure ?Electronic Signature(s) ?Signed: 02/20/2022 6:11:59 PM By: Baruch Gouty RN, BSN ?Signed: 02/21/2022 5:13:50 PM By: Fredirick Maudlin MD FACS ?Entered By: Baruch Gouty on 02/20/2022 15:49:45 ?-------------------------------------------------------------------------------- ?HPI Details ?Patient Name: ?Date of Service: ?[facility], [place] Angela Y. 02/20/2022 2:45 PM ?Medical Record Number: [id number] ?Patient Account Number: [id number] ?Date of Birth/Sex: ?Treating RN: ?Mar 01, 1956 (19 [name]) Boehlein, Linda ?Primary Care Provider: [name] ?Other Clinician: ?Referring Provider: ?Treating Provider/Extender: Fredirick Maudlin ?[name] ?Weeks in Treatment: 5 ?History of Present Illness ?HPI Description: Long standing history  of pressure ulcers of ischium and posterior calf. Has LAL mattress. Has home health and aide. Prealbumin 18 last ?check, states taking carnation breakfast once daily. ?11/01/15 Last seen 4 weeks ago. Current collagen to wounds. Was discharged from prior home health due to non compliance. Has healed calf ulcer.Left heel ?from wearing Ugg boots. ?12/20/15; the only area that remains according to the patient is on the posterior aspect of her left ankle over the Achilles area although this looks very healthy. ?Apparently she has no open wounds on her buttock's or her right leg ?01/17/16; the area of the we were currently treating his on the posterior aspect of her left ankle. This is just about closed. I did a light surface debridement here. ?She shows as in the wound today on her posterior thigh just above her antecubital fossa on the right. The wound itself looks clean. It looks like it is probably ?pressure with her wheelchair and she although the patient thinks that this is a [employer] fast streamer strap injury. Eagar has been dressing this with calcium ?alginate for the last 2 weeks. Using collagen on the  left lateral leg ?02/28/16; the patient follows episodically/monthly here for pressure ulcers. Currently she has an area on her posterior thigh which is clearly trauma on her ?wheelchair cushion. She has an area on the left lateral leg and new areas on the right great toe at the tip which are probably pressure areas from a new ?wheelchair according to the patient. As no evidence of infection in either one of these areas ?04/10/16; the patient a follows here episodically for pressure ulcers. She has advanced Homecare but apparently most of the dressings are being done by her ?care attendance at home. The area on her left great toe is healed. The area on her left lateral malleolus has not in fact this is a deep wound and I'm not even ?sure that there isn't exposed bone here. The surface does not look healthy. The area on her right posterior thigh is still open but superficial ?04/24/16; I normally follow this woman monthly however I had some concerns about the area on her left lateral malleolus. X-ray of this area did not show ?osteomyelitis. ?05/22/16; the patient arrives for her monthly visit stating that recently she doesn't feel Santyl has been applied/properly applied to her wounds. She states that ?she rigorously offloads these areas at all times and isn't really certain why they're not healing. Concerned about erythema around the left lateral malleolus ?wound. ?06/05/16 once again the patient arrives with wounds not in very good condition. The area over her left lateral malleolus and right heel both requiring extensive ?debridement very copious amounts necrotic tissue. She has been using Santyl. ?READMISSION ?01/12/2022 ?[name] is now a 66 year old woman who lives in [place]. She has 4 grandchildren still lives reasonably independently with help of family. She was ?here several times previously in 2014, 2015, 2016 and most recently in 2017 from 12/20/2015 through 06/05/2016 with wounds on her left lateral malleolus  and ?right heel. These were pressure ulcers but she was discharged in a nonhealed state. 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 I see that ?she was in [facility] wound care center i

## 2022-03-06 ENCOUNTER — Encounter (HOSPITAL_BASED_OUTPATIENT_CLINIC_OR_DEPARTMENT_OTHER): Payer: Medicare Other | Admitting: General Surgery

## 2022-03-07 ENCOUNTER — Encounter: Payer: Self-pay | Admitting: Physician Assistant

## 2022-03-07 ENCOUNTER — Telehealth: Payer: Self-pay | Admitting: Physician Assistant

## 2022-03-07 LAB — POCT INR: INR: 5 — AB (ref 0.80–1.20)

## 2022-03-07 NOTE — Telephone Encounter (Signed)
Patient called with her INR results which was a 5.0. Please advise. [phone] ?

## 2022-03-16 LAB — POCT INR: INR: 3 — AB (ref 0.80–1.20)

## 2022-03-20 ENCOUNTER — Encounter (HOSPITAL_BASED_OUTPATIENT_CLINIC_OR_DEPARTMENT_OTHER): Payer: Medicare Other | Attending: General Surgery | Admitting: General Surgery

## 2022-03-20 ENCOUNTER — Encounter: Payer: Self-pay | Admitting: Physician Assistant

## 2022-03-20 DIAGNOSIS — L89623 Pressure ulcer of left heel, stage 3: Secondary | ICD-10-CM | POA: Diagnosis present

## 2022-03-20 DIAGNOSIS — L97828 Non-pressure chronic ulcer of other part of left lower leg with other specified severity: Secondary | ICD-10-CM | POA: Insufficient documentation

## 2022-03-20 DIAGNOSIS — G8250 Quadriplegia, unspecified: Secondary | ICD-10-CM | POA: Diagnosis not present

## 2022-03-20 DIAGNOSIS — S14109S Unspecified injury at unspecified level of cervical spinal cord, sequela: Secondary | ICD-10-CM | POA: Diagnosis not present

## 2022-03-20 DIAGNOSIS — E039 Hypothyroidism, unspecified: Secondary | ICD-10-CM | POA: Insufficient documentation

## 2022-03-20 DIAGNOSIS — X58XXXS Exposure to other specified factors, sequela: Secondary | ICD-10-CM | POA: Diagnosis not present

## 2022-03-20 NOTE — Progress Notes (Signed)
[place], [place]. ([id number]) ?Visit Report for 03/20/2022 ?Arrival Information Details ?Patient Name: Date of Service: ?[place], [name] 03/20/2022 12:30 PM ?Medical Record Number: [id number] ?Patient Account Number: [id number] ?Date of Birth/Sex: Treating RN: ?21-Jun-1956 (66 [name]) Boehlein, Linda ?Primary Care Sharnese Heath: [name] Other Clinician: ?Referring Lalaine Overstreet: ?Treating Shandee Jergens/Extender: Fredirick Maudlin ?[name] ?Weeks in Treatment: 9 ?Visit Information History Since Last Visit ?Added or deleted any medications: No ?Patient Arrived: Wheel Chair ?Any new allergies or adverse reactions: No ?Arrival Time: 12:52 ?Had a fall or experienced change in No ?Accompanied By: sister ?activities of daily living that may affect ?Transfer Assistance: None ?risk of falls: ?Patient Identification Verified: Yes ?Signs or symptoms of abuse/neglect since last visito No ?Secondary Verification Process Completed: Yes ?Hospitalized since last visit: No ?Patient Requires Transmission-Based Precautions: No ?Implantable device outside of the clinic excluding No ?Patient Has Alerts: Yes ?cellular tissue based products placed in the center ?Patient Alerts: Patient on Blood Thinner since last visit: ?Coumadin Has Dressing in Place as Prescribed: Yes ?Has Compression in Place as Prescribed: Yes ?Pain Present Now: No ?Notes ?stayed in wheelchair ?Electronic Signature(s) ?Signed: 03/20/2022 5:24:08 PM By: Baruch Gouty RN, BSN ?Entered By: Baruch Gouty on 03/20/2022 12:53:37 ?-------------------------------------------------------------------------------- ?Encounter Discharge Information Details ?Patient Name: Date of Service: ?[place], [name] 03/20/2022 12:30 PM ?Medical Record Number: [id number] ?Patient Account Number: [id number] ?Date of Birth/Sex: Treating RN: ?1956/10/30 (32 [name]) Boehlein, Linda ?Primary Care Arianah Torgeson: [name] Other Clinician: ?Referring Taeveon Keesling: ?Treating Dorlis Judice/Extender: Fredirick Maudlin ?[name] ?Weeks in Treatment: 9 ?Encounter Discharge Information Items Post Procedure Vitals ?Discharge Condition: Stable ?Temperature (F): 97.8 ?Ambulatory Status: Wheelchair ?Pulse (bpm): 64 ?Discharge Destination: Home ?Respiratory Rate (breaths/min): 18 ?Transportation: Private Auto ?Blood Pressure (mmHg): 111/76 ?Accompanied By: sister ?Schedule Follow-up Appointment: Yes ?Clinical Summary of Care: Patient Declined ?Electronic Signature(s) ?Signed: 03/20/2022 5:24:08 PM By: Baruch Gouty RN, BSN ?Entered By: Baruch Gouty on 03/20/2022 13:34:41 ?-------------------------------------------------------------------------------- ?Lower Extremity Assessment Details ?Patient Name: ?Date of Service: ?[name], [name] 03/20/2022 12:30 PM ?Medical Record Number: [id number] ?Patient Account Number: [id number] ?Date of Birth/Sex: ?Treating RN: ?1956/05/06 (44 [name]) Boehlein, Linda ?Primary Care Joley Utecht: [name] ?Other Clinician: ?Referring Chaquita Basques: ?Treating Nohelia Valenza/Extender: Fredirick Maudlin ?[name] ?Weeks in Treatment: 9 ?Edema Assessment ?Assessed: [Left: No] [Right: No] ?Edema: [Left: Ye] [Right: s] ?Calf ?Left: Right: ?Point of Measurement: 31 cm From Medial Instep 27.5 cm ?Ankle ?Left: Right: ?Point of Measurement: 7 cm From Medial Instep 21 cm ?Vascular Assessment ?Pulses: ?Dorsalis Pedis ?Palpable: [Left:Yes] ?Electronic Signature(s) ?Signed: 03/20/2022 5:24:08 PM By: Baruch Gouty RN, BSN ?Entered By: Baruch Gouty on 03/20/2022 12:59:04 ?-------------------------------------------------------------------------------- ?Multi Wound Chart Details ?Patient Name: ?Date of Service: ?[place], [name] 03/20/2022 12:30 PM ?Medical Record Number: [id number] ?Patient Account Number: [id number] ?Date of Birth/Sex: ?Treating RN: ?Apr 18, 1956 (30 [name]) Boehlein, Linda ?Primary Care Kjell Brannen: [name] ?Other Clinician: ?Referring Georgi Navarrete: ?Treating Rex Magee/Extender: Fredirick Maudlin ?[name] ?Weeks in Treatment: 9 ?Vital Signs ?Height(in): 64 ?Pulse(bpm): 64 ?Weight(lbs): 175 ?Blood Pressure(mmHg): 111/76 ?Body Mass Index(BMI): 30 ?Temperature(??F): 97.8 ?Respiratory Rate(breaths/min): 18 ?Photos: [22:Left Calcaneus] [23:Left, Posterior Ankle] [N/A:N/A N/A] ?Wound Location: [22:Pressure Injury] [23:Pressure Injury] [N/A:N/A] ?Wounding Event: [22:Pressure Ulcer] [23:Pressure Ulcer] [N/A:N/A] ?Primary Etiology: [22:Cataracts, Hypotension, Neuropathy, Cataracts, Hypotension, Neuropathy, N/A] ?Comorbid History: [22:Quadriplegia 11/03/2021] [23:Quadriplegia 11/03/2021] [N/A:N/A] ?Date Acquired: [22:9] [23:9] [N/A:N/A] ?Weeks of Treatment: [22:Open] [23:Open] [N/A:N/A] ?Wound Status: [22:No] [23:No] [N/A:N/A] ?Wound Recurrence: [22:2.5x2.3x0.1] [23:2.7x2.3x0.3] [N/A:N/A] ?Measurements L x W x D (cm) [77:9.390] [30:0.923] [N/A:N/A] ?A (cm?) : ?rea [  22:0.452] [23:1.463] [N/A:N/A] ?Volume (cm?) : [22:28.10%] [23:-15.00%] [N/A:N/A] ?% Reduction in A [22:rea: 76.00%] [23:13.70%] [N/A:N/A] ?% Reduction in Volume: [22:Category/Stage III] [23:Category/Stage III] [N/A:N/A] ?Classification: [22:Medium] [23:Medium] [N/A:N/A] ?Exudate A mount: [22:Serosanguineous] [23:Serosanguineous] [N/A:N/A] ?Exudate Type: [22:red, brown] [23:red, brown] [N/A:N/A] ?Exudate Color: [22:Distinct, outline attached] [23:Flat and Intact] [N/A:N/A] ?Wound Margin: [22:Small (1-33%)] [23:Small (1-33%)] [N/A:N/A] ?Granulation A mount: [22:Pink] [23:Red] [N/A:N/A] ?Granulation Quality: [22:Large (67-100%)] [23:Large (67-100%)] [N/A:N/A] ?Necrotic A mount: [22:Eschar, Adherent Slough] [23:Eschar, Adherent Slough] [N/A:N/A] ?Necrotic Tissue: ?[22:Fat Layer (Subcutaneous Tissue): Yes Fat Layer (Subcutaneous Tissue): Yes N/A] ?Exposed Structures: ?[22:Fascia: No Tendon: No Muscle: No Joint: No Bone: No Small (1-33%)] [23:Fascia: No Tendon: No Muscle: No Joint: No Bone: No Small (1-33%)] [N/A:N/A] ?Epithelialization:  [22:Debridement - Excisional] [23:Debridement - Excisional] [N/A:N/A] ?Debridement: ?Pre-procedure Verification/Time Out 13:10 [23:13:10] [N/A:N/A] ?Taken: [22:Subcutaneous, Slough] [23:Subcutaneous, Slough] [N/A:N/A] ?Tissue Debrided: [22:Skin/Subcutaneous Tissue] [23:Skin/Subcutaneous Tissue] [N/A:N/A] ?Level: [22:5.75] [68:1.27] [N/A:N/A] ?Debridement A (sq cm): [22:rea Curette] [23:Curette] [N/A:N/A] ?Instrument: [22:Minimum] [23:Minimum] [N/A:N/A] ?Bleeding: [22:Pressure] [23:Pressure] [N/A:N/A] ?Hemostasis A chieved: [22:Insensate] [23:Insensate] [N/A:N/A] ?Procedural Pain: [22:Insensate] [23:Insensate] [N/A:N/A] ?Post Procedural Pain: [22:Procedure was tolerated well] [23:Procedure was tolerated well] [N/A:N/A] ?Debridement Treatment Response: [22:2.5x2.3x0.1] [23:2.7x2.3x0.3] [N/A:N/A] ?Post Debridement Measurements L x ?W x D (cm) [22:0.452] [23:1.463] [N/A:N/A] ?Post Debridement Volume: (cm?) [22:Category/Stage III] [23:Category/Stage III] [N/A:N/A] ?Post Debridement Stage: [22:Debridement] [23:Debridement] [N/A:N/A] ?Treatment Notes ?Wound #22 (Calcaneus) Wound Laterality: Left ?Cleanser ?Soap and Water ?Discharge Instruction: May shower and wash wound with dial antibacterial soap and water prior to dressing change. ?Wound Cleanser ?Discharge Instruction: Cleanse the wound with wound cleanser prior to applying a clean dressing using gauze sponges, not tissue or cotton balls. ?Peri-Wound Care ?Zinc Oxide Ointment 30g tube ?Discharge Instruction: Apply Zinc Oxide to periwound with each dressing change ?Topical ?Primary Dressing ?Santyl Ointment ?Discharge Instruction: Apply nickel thick amount to wound bed as instructed ?Secondary Dressing ?Woven Gauze Sponges 2x2 in ?Discharge Instruction: moisten with saline and Apply over santyl to fill the wound ?Zetuvit Plus Silicone Border Dressing 7x7(in/in) ?Discharge Instruction: Apply silicone border over primary dressing as directed. ?Secured With ?106M Medipore H  Soft Cloth Surgical T ape, 4 x 10 (in/yd) ?Discharge Instruction: Secure with tape as needed ?Compression Wrap ?Compression Stockings ?Add-Ons ?Wound #23 (Ankle) Wound Laterality: Left, Posterior ?Cleanser ?Soa

## 2022-03-20 NOTE — Progress Notes (Signed)
[place], [place]. ([id number]) ?Visit Report for 03/20/2022 ?Chief Complaint Document Details ?Patient Name: Date of Service: ?[name], [name] 03/20/2022 12:30 PM ?Medical Record Number: [id number] ?Patient Account Number: [id number] ?Date of Birth/Sex: Treating RN: ?[birth year] (66 [name]. F) Boehlein, Linda ?Primary Care Provider: [name] Other Clinician: ?Referring Provider: ?Treating Provider/Extender: [name] ?[name] ?Weeks in Treatment: 9 ?Information Obtained from: Patient ?Chief Complaint ?Patient is at the clinic for treatment of an open pressure ulcers ?01/12/2022; patient returns to clinic with an area on her left heel and left posterior calf ?Electronic Signature(s) ?Signed: 03/20/2022 1:40:32 PM By: Angela Maudlin MD FACS ?Entered By: [name] on 03/20/2022 13:40:31 ?-------------------------------------------------------------------------------- ?Debridement Details ?Patient Name: Date of Service: ?[name], [name] 03/20/2022 12:30 PM ?Medical Record Number: [id number] ?Patient Account Number: [id number] ?Date of Birth/Sex: Treating RN: ?[birth year] (71 [name]. F) Boehlein, Linda ?Primary Care Provider: [name] Other Clinician: ?Referring Provider: ?Treating Provider/Extender: [name] ?[name] ?Weeks in Treatment: 9 ?Debridement Performed for Assessment: Wound #22 Left Calcaneus ?Performed By: Physician Angela Maudlin, MD ?Debridement Type: Debridement ?Level of Consciousness (Pre-procedure): Awake and Alert ?Pre-procedure Verification/Time Out Yes - 13:10 ?Taken: ?Start Time: 13:13 ?T Area Debrided (L x W): ?otal 2.5 (cm) x 2.3 (cm) = 5.75 (cm?) ?Tissue and other material debrided: Viable, Non-Viable, Slough, Subcutaneous, Slough ?Level: Skin/Subcutaneous Tissue ?Debridement Description: Excisional ?Instrument: Curette ?Bleeding: Minimum ?Hemostasis Achieved: Pressure ?Procedural Pain: Insensate ?Post Procedural Pain: Insensate ?Response to Treatment:  Procedure was tolerated well ?Level of Consciousness (Post- Awake and Alert ?procedure): ?Post Debridement Measurements of Total Wound ?Length: (cm) 2.5 ?Stage: Category/Stage III ?Width: (cm) 2.3 ?Depth: (cm) 0.1 ?Volume: (cm?) 0.452 ?Character of Wound/Ulcer Post Debridement: Requires Further Debridement ?Post Procedure Diagnosis ?Same as Pre-procedure ?Electronic Signature(s) ?Signed: 03/20/2022 3:26:03 PM By: Angela Maudlin MD FACS ?Signed: 03/20/2022 5:24:08 PM By: Baruch Gouty RN, BSN ?Entered By: Baruch Gouty on 03/20/2022 13:16:15 ?-------------------------------------------------------------------------------- ?Debridement Details ?Patient Name: ?Date of Service: ?Angela [place], [name] 03/20/2022 12:30 PM ?Medical Record Number: [id number] ?Patient Account Number: [id number] ?Date of Birth/Sex: ?Treating RN: ?September 28, 1956 (23 [name]. F) Boehlein, Linda ?Primary Care Provider: [name] ?Other Clinician: ?Referring Provider: ?Treating Provider/Extender: [name] ?[name] ?Weeks in Treatment: 9 ?Debridement Performed for Assessment: Wound #23 Left,Posterior Ankle ?Performed By: Physician Angela Maudlin, MD ?Debridement Type: Debridement ?Level of Consciousness (Pre-procedure): Awake and Alert ?Pre-procedure Verification/Time Out Yes - 13:10 ?Taken: ?Start Time: 13:13 ?T Area Debrided (L x W): ?otal 2.7 (cm) x 2.3 (cm) = 6.21 (cm?) ?Tissue and other material debrided: Viable, Non-Viable, Slough, Subcutaneous, Slough ?Level: Skin/Subcutaneous Tissue ?Debridement Description: Excisional ?Instrument: Curette ?Bleeding: Minimum ?Hemostasis Achieved: Pressure ?Procedural Pain: Insensate ?Post Procedural Pain: Insensate ?Response to Treatment: Procedure was tolerated well ?Level of Consciousness (Post- Awake and Alert ?procedure): ?Post Debridement Measurements of Total Wound ?Length: (cm) 2.7 ?Stage: Category/Stage III ?Width: (cm) 2.3 ?Depth: (cm) 0.3 ?Volume: (cm?) 1.463 ?Character of  Wound/Ulcer Post Debridement: Requires Further Debridement ?Post Procedure Diagnosis ?Same as Pre-procedure ?Electronic Signature(s) ?Signed: 03/20/2022 3:26:03 PM By: Angela Maudlin MD FACS ?Signed: 03/20/2022 5:24:08 PM By: Baruch Gouty RN, BSN ?Entered By: Baruch Gouty on 03/20/2022 13:16:44 ?-------------------------------------------------------------------------------- ?HPI Details ?Patient Name: ?Date of Service: ?Angela T[place], [name] 03/20/2022 12:30 PM ?Medical Record Number: [id number] ?Patient Account Number: [id number] ?Date of Birth/Sex: ?Treating RN: ?09/26/1956 (38 [name]. F) Boehlein, Linda ?Primary Care Provider: [name] ?Other Clinician: ?Referring Provider: ?Treating Provider/Extender: [name] ?[name] ?Weeks in Treatment: 9 ?History of Present Illness ?HPI Description: Long standing history  of pressure ulcers of ischium and posterior calf. Has LAL mattress. Has home health and aide. Prealbumin 18 last ?check, states taking carnation breakfast once daily. ?11/01/15 Last seen 4 weeks ago. Current collagen to wounds. Was discharged from prior home health due to non compliance. Has healed calf ulcer.Left heel ?from wearing Ugg boots. ?12/20/15; the only area that remains according to the patient is on the posterior aspect of her left ankle over the Achilles area although this looks very healthy. ?Apparently she has no open wounds on her buttock's or her right leg ?01/17/16; the area of the we were currently treating his on the posterior aspect of her left ankle. This is just about closed. I did a light surface debridement here. ?She shows as in the wound today on her posterior thigh just above her antecubital fossa on the right. The wound itself looks clean. It looks like it is probably ?pressure with her wheelchair and she although the patient thinks that this is a [employer] fast streamer strap injury. [place] has been dressing this with calcium ?alginate for the last 2 weeks. Using  collagen on the left lateral leg ?02/28/16; the patient follows episodically/monthly here for pressure ulcers. Currently she has an area on her posterior thigh which is clearly trauma on her ?wheelchair cushion. She has an area on the left lateral leg and new areas on the right great toe at the tip which are probably pressure areas from a new ?wheelchair according to the patient. As no evidence of infection in either one of these areas ?04/10/16; the patient a follows here episodically for pressure ulcers. She has advanced Homecare but apparently most of the dressings are being done by her ?care attendance at home. The area on her left great toe is healed. The area on her left lateral malleolus has not in fact this is a deep wound and I'm not even ?sure that there isn't exposed bone here. The surface does not look healthy. The area on her right posterior thigh is still open but superficial ?04/24/16; I normally follow this woman monthly however I had some concerns about the area on her left lateral malleolus. X-ray of this area did not show ?osteomyelitis. ?05/22/16; the patient arrives for her monthly visit stating that recently she doesn't feel Santyl has been applied/properly applied to her wounds. She states that ?she rigorously offloads these areas at all times and isn't really certain why they're not healing. Concerned about erythema around the left lateral malleolus ?wound. ?06/05/16 once again the patient arrives with wounds not in very good condition. The area over her left lateral malleolus and right heel both requiring extensive ?debridement very copious amounts necrotic tissue. She has been using Santyl. ?READMISSION ?01/12/2022 ?[name] is now a 66 year old woman who lives in [place]. She has 4 grandchildren still lives reasonably independently with help of family. She was ?here several times previously in 2014, 2015, 2016 and most recently in 2017 from 12/20/2015 through 06/05/2016 with wounds on her left  lateral malleolus and ?right heel. These were pressure ulcers but she was discharged in a nonhealed state. 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 I see that ?she was in [facility] wound

## 2022-04-01 ENCOUNTER — Other Ambulatory Visit: Payer: Self-pay | Admitting: Physician Assistant

## 2022-04-01 ENCOUNTER — Other Ambulatory Visit: Payer: Self-pay | Admitting: Nurse Practitioner

## 2022-04-01 DIAGNOSIS — G8250 Quadriplegia, unspecified: Secondary | ICD-10-CM

## 2022-04-01 DIAGNOSIS — N319 Neuromuscular dysfunction of bladder, unspecified: Secondary | ICD-10-CM

## 2022-04-04 ENCOUNTER — Telehealth: Payer: Self-pay | Admitting: Physician Assistant

## 2022-04-04 LAB — POCT INR: INR: 2.9 (ref 2.0–3.0)

## 2022-04-04 NOTE — Telephone Encounter (Signed)
Patients INR level is 2.9 (within goal limit) advised to continue at current med regimen. AS, CMA ?

## 2022-04-17 ENCOUNTER — Encounter (HOSPITAL_BASED_OUTPATIENT_CLINIC_OR_DEPARTMENT_OTHER): Payer: Medicare Other | Attending: General Surgery | Admitting: General Surgery

## 2022-04-17 DIAGNOSIS — L97828 Non-pressure chronic ulcer of other part of left lower leg with other specified severity: Secondary | ICD-10-CM | POA: Insufficient documentation

## 2022-04-17 DIAGNOSIS — G8250 Quadriplegia, unspecified: Secondary | ICD-10-CM | POA: Insufficient documentation

## 2022-04-17 DIAGNOSIS — L89623 Pressure ulcer of left heel, stage 3: Secondary | ICD-10-CM | POA: Diagnosis not present

## 2022-04-17 DIAGNOSIS — S14109S Unspecified injury at unspecified level of cervical spinal cord, sequela: Secondary | ICD-10-CM | POA: Insufficient documentation

## 2022-04-17 DIAGNOSIS — Z7901 Long term (current) use of anticoagulants: Secondary | ICD-10-CM | POA: Diagnosis not present

## 2022-04-17 NOTE — Progress Notes (Signed)
[place], [place]. ([id number]) ?Visit Report for 04/17/2022 ?Arrival Information Details ?Patient Name: Date of Service: ?[name], [place] RTHA Y. 04/17/2022 12:30 PM ?Medical Record Number: [id number] ?Patient Account Number: [id number] ?Date of Birth/Sex: Treating RN: ?1956-06-23 (66 [name]) Boehlein, Linda ?Primary Care Debe Anfinson: [name] Other Clinician: ?Referring Elba Dendinger: ?Treating Kodi Guerrera/Extender: Fredirick Maudlin ?[name] ?Weeks in Treatment: 13 ?Visit Information History Since Last Visit ?Added or deleted any medications: No ?Patient Arrived: Wheel Chair ?Any new allergies or adverse reactions: No ?Arrival Time: 12:41 ?Had a fall or experienced change in No ?Accompanied By: self ?activities of daily living that may affect ?Transfer Assistance: None ?risk of falls: ?Patient Identification Verified: Yes ?Signs or symptoms of abuse/neglect since last visito No ?Secondary Verification Process Completed: Yes ?Hospitalized since last visit: No ?Patient Requires Transmission-Based Precautions: No ?Implantable device outside of the clinic excluding No ?Patient Has Alerts: Yes ?cellular tissue based products placed in the center ?Patient Alerts: Patient on Blood Thinner since last visit: ?Coumadin Has Dressing in Place as Prescribed: Yes ?Pain Present Now: No ?Electronic Signature(s) ?Signed: 04/17/2022 5:49:11 PM By: Baruch Gouty RN, BSN ?Entered By: Baruch Gouty on 04/17/2022 12:45:22 ?-------------------------------------------------------------------------------- ?Encounter Discharge Information Details ?Patient Name: Date of Service: ?[name], Angela RTHA Y. 04/17/2022 12:30 PM ?Medical Record Number: [id number] ?Patient Account Number: [id number] ?Date of Birth/Sex: Treating RN: ?06/04/1956 (30 [name]) Boehlein, Linda ?Primary Care Adoni Greenough: [name] Other Clinician: ?Referring Cabrina Shiroma: ?Treating Jaysiah Marchetta/Extender: Fredirick Maudlin ?[name] ?Weeks in Treatment: 13 ?Encounter Discharge  Information Items Post Procedure Vitals ?Discharge Condition: Stable ?Temperature (F): 98.2 ?Ambulatory Status: Wheelchair ?Pulse (bpm): 63 ?Discharge Destination: Home ?Respiratory Rate (breaths/min): 18 ?Transportation: Private Auto ?Blood Pressure (mmHg): 94/62 ?Accompanied By: daughter ?Schedule Follow-up Appointment: Yes ?Clinical Summary of Care: Patient Declined ?Electronic Signature(s) ?Signed: 04/17/2022 5:49:11 PM By: Baruch Gouty RN, BSN ?Entered By: Baruch Gouty on 04/17/2022 17:05:19 ?-------------------------------------------------------------------------------- ?Lower Extremity Assessment Details ?Patient Name: ?Date of Service: ?[name], Angela RTHA Y. 04/17/2022 12:30 PM ?Medical Record Number: [id number] ?Patient Account Number: [id number] ?Date of Birth/Sex: ?Treating RN: ?13-Jul-1956 (56 [name]) Boehlein, Linda ?Primary Care Iliyana Convey: [name] ?Other Clinician: ?Referring Manny Vitolo: ?Treating Saranda Legrande/Extender: Fredirick Maudlin ?[name] ?Weeks in Treatment: 13 ?Edema Assessment ?Assessed: [Left: No] [Right: No] ?Edema: [Left: Ye] [Right: s] ?Calf ?Left: Right: ?Point of Measurement: 31 cm From Medial Instep 27.5 cm ?Ankle ?Left: Right: ?Point of Measurement: 7 cm From Medial Instep 21.5 cm ?Vascular Assessment ?Pulses: ?Dorsalis Pedis ?Palpable: [Left:Yes] ?Electronic Signature(s) ?Signed: 04/17/2022 5:49:11 PM By: Baruch Gouty RN, BSN ?Entered By: Baruch Gouty on 04/17/2022 12:55:16 ?-------------------------------------------------------------------------------- ?Multi Wound Chart Details ?Patient Name: ?Date of Service: ?[name], [place] RTHA Y. 04/17/2022 12:30 PM ?Medical Record Number: [id number] ?Patient Account Number: [id number] ?Date of Birth/Sex: ?Treating RN: ?Jan 03, 1956 (63 [name]) ?Primary Care Georga Stys: [name] ?Other Clinician: ?Referring Markeise Mathews: ?Treating Aceton Kinnear/Extender: Fredirick Maudlin ?[name] ?Weeks in Treatment: 13 ?Vital Signs ?Height(in):  64 ?Pulse(bpm): 63 ?Weight(lbs): 175 ?Blood Pressure(mmHg): 94/62 ?Body Mass Index(BMI): 30 ?Temperature(??F): 98.2 ?Respiratory Rate(breaths/min): 18 ?Photos: [22:Left Calcaneus] [23:Left, Posterior Ankle] [N/A:N/A N/A] ?Wound Location: [22:Pressure Injury] [23:Pressure Injury] [N/A:N/A] ?Wounding Event: [22:Pressure Ulcer] [23:Pressure Ulcer] [N/A:N/A] ?Primary Etiology: [22:Cataracts, Hypotension, Neuropathy, Cataracts, Hypotension, Neuropathy, N/A] ?Comorbid History: [22:Quadriplegia 11/03/2021] [23:Quadriplegia 11/03/2021] [N/A:N/A] ?Date Acquired: [22:13] [23:13] [N/A:N/A] ?Weeks of Treatment: [22:Open] [23:Open] [N/A:N/A] ?Wound Status: [22:No] [23:No] [N/A:N/A] ?Wound Recurrence: [22:3x4x0.1] [23:4x2.6x0.6] [N/A:N/A] ?Measurements L x W x D (cm) [22:9.425] [23:8.168] [N/A:N/A] ?A (cm?) : ?rea [62:8.315] [17:6.160] [N/A:N/A] ?Volume (cm?) : [22:-50.00%] [23:-92.60%] [N/A:N/A] ?% Reduction in A [  22:rea: 50.00%] [23:-189.00%] [N/A:N/A] ?% Reduction in Volume: [22:Category/Stage III] [23:Category/Stage III] [N/A:N/A] ?Classification: [22:Medium] [23:Medium] [N/A:N/A] ?Exudate A mount: [22:Serosanguineous] [23:Serosanguineous] [N/A:N/A] ?Exudate Type: [22:red, brown] [23:red, brown] [N/A:N/A] ?Exudate Color: [22:Distinct, outline attached] [23:Flat and Intact] [N/A:N/A] ?Wound Margin: [22:Small (1-33%)] [23:Small (1-33%)] [N/A:N/A] ?Granulation A mount: [22:Pink] [23:Red] [N/A:N/A] ?Granulation Quality: [22:Large (67-100%)] [23:Large (67-100%)] [N/A:N/A] ?Necrotic A mount: [22:Eschar, Adherent Slough] [23:Adherent Slough] [N/A:N/A] ?Necrotic Tissue: ?[22:Fat Layer (Subcutaneous Tissue): Yes Fat Layer (Subcutaneous Tissue): Yes N/A] ?Exposed Structures: ?[22:Fascia: No Tendon: No Muscle: No Joint: No Bone: No None] [23:Fascia: No Tendon: No Muscle: No Joint: No Bone: No None] [N/A:N/A] ?Epithelialization: [22:Debridement - Excisional] [23:Debridement - Excisional] [N/A:N/A] ?Debridement: ?Pre-procedure  Verification/Time Out 13:10 [23:13:10] [N/A:N/A] ?Taken: [22:Subcutaneous, Slough] [23:Subcutaneous, Slough] [N/A:N/A] ?Tissue Debrided: [22:Skin/Subcutaneous Tissue] [23:Skin/Subcutaneous Tissue] [N/A:N/A] ?Level: [22:12] [23:10.4] [N/A:N/A] ?Debridement A (sq cm): [22:rea Curette] [23:Curette] [N/A:N/A] ?Instrument: [22:Minimum] [23:Minimum] [N/A:N/A] ?Bleeding: [22:Pressure] [23:Pressure] [N/A:N/A] ?Hemostasis A chieved: [22:Insensate] [23:Insensate] [N/A:N/A] ?Procedural Pain: [22:Insensate] [23:Insensate] [N/A:N/A] ?Post Procedural Pain: [22:Procedure was tolerated well] [23:Procedure was tolerated well] [N/A:N/A] ?Debridement Treatment Response: [22:3x4x0.1] [23:4x2.6x0.6] [N/A:N/A] ?Post Debridement Measurements L x ?W x D (cm) [62:3.762] [83:1.517] [N/A:N/A] ?Post Debridement Volume: (cm?) [22:Category/Stage III] [23:Category/Stage III] [N/A:N/A] ?Post Debridement Stage: [22:Debridement] [23:Debridement] [N/A:N/A] ?Treatment Notes ?Electronic Signature(s) ?Signed: 04/17/2022 1:40:11 PM By: Fredirick Maudlin MD FACS ?Entered By: Fredirick Maudlin on 04/17/2022 13:40:11 ?-------------------------------------------------------------------------------- ?Multi-Disciplinary Care Plan Details ?Patient Name: ?Date of Service: ?[name], Angela RTHA Y. 04/17/2022 12:30 PM ?Medical Record Number: [id number] ?Patient Account Number: [id number] ?Date of Birth/Sex: ?Treating RN: ?1956-11-29 (41 [name]) Boehlein, Linda ?Primary Care Sasuke Yaffe: [name] ?Other Clinician: ?Referring Abdoul Encinas: ?Treating Dain Laseter/Extender: Fredirick Maudlin ?[name] ?Weeks in Treatment: 13 ?Multidisciplinary Care Plan reviewed with physician ?Active Inactive ?Pressure ?Nursing Diagnoses: ?Knowledge deficit related to causes and risk factors for pressure ulcer development ?Knowledge deficit related to management of pressures ulcers ?Potential for impaired tissue integrity related to pressure, friction, moisture, and  shear ?Goals: ?Patient/caregiver will verbalize understanding of pressure ulcer management ?Date Initiated: 03/20/2022 ?Target Resolution Date: 05/15/2022 ?Goal Status: Active ?Interventions: ?Assess: immobility, friction, shearing, incontinence upon a

## 2022-04-17 NOTE — Progress Notes (Signed)
[name], [name]. ([id number]) ?Visit Report for 04/17/2022 ?Chief Complaint Document Details ?Patient Name: Date of Service: ?[name], [name] RTHA Y. 04/17/2022 12:30 PM ?Medical Record Number: [id number] ?Patient Account Number: [id number] ?Date of Birth/Sex: Treating RN: ?1956/05/04 (66 y.o. F) ?Primary Care Provider: [name] Other Clinician: ?Referring Provider: ?Treating Provider/Extender: Fredirick Maudlin ?[name] ?Weeks in Treatment: 13 ?Information Obtained from: Patient ?Chief Complaint ?Patient is at the clinic for treatment of an open pressure ulcers ?01/12/2022; patient returns to clinic with an area on her left heel and left posterior calf ?Electronic Signature(s) ?Signed: 04/17/2022 1:40:21 PM By: Fredirick Maudlin MD FACS ?Entered By: Fredirick Maudlin on 04/17/2022 13:40:21 ?-------------------------------------------------------------------------------- ?Debridement Details ?Patient Name: Date of Service: ?[name], [name] RTHA Y. 04/17/2022 12:30 PM ?Medical Record Number: [id number] ?Patient Account Number: [id number] ?Date of Birth/Sex: Treating RN: ?[birth year] (66 y.o. F) Boehlein, Linda ?Primary Care Provider: [name] Other Clinician: ?Referring Provider: ?Treating Provider/Extender: Fredirick Maudlin ?[name] ?Weeks in Treatment: 13 ?Debridement Performed for Assessment: Wound #23 Left,Posterior Ankle ?Performed By: Physician Fredirick Maudlin, MD ?Debridement Type: Debridement ?Level of Consciousness (Pre-procedure): Awake and Alert ?Pre-procedure Verification/Time Out Yes - 13:10 ?Taken: ?Start Time: 13:11 ?T Area Debrided (L x W): ?otal 4 (cm) x 2.6 (cm) = 10.4 (cm?) ?Tissue and other material debrided: Viable, Non-Viable, Slough, Subcutaneous, Slough ?Level: Skin/Subcutaneous Tissue ?Debridement Description: Excisional ?Instrument: Curette ?Bleeding: Minimum ?Hemostasis Achieved: Pressure ?Procedural Pain: Insensate ?Post Procedural Pain: Insensate ?Response to Treatment: Procedure  was tolerated well ?Level of Consciousness (Post- Awake and Alert ?procedure): ?Post Debridement Measurements of Total Wound ?Length: (cm) 4 ?Stage: Category/Stage III ?Width: (cm) 2.6 ?Depth: (cm) 0.6 ?Volume: (cm?) 4.901 ?Character of Wound/Ulcer Post Debridement: Requires Further Debridement ?Post Procedure Diagnosis ?Same as Pre-procedure ?Electronic Signature(s) ?Signed: 04/17/2022 5:05:09 PM By: Fredirick Maudlin MD FACS ?Signed: 04/17/2022 5:49:11 PM By: Baruch Gouty RN, BSN ?Entered By: Baruch Gouty on 04/17/2022 13:15:05 ?-------------------------------------------------------------------------------- ?Debridement Details ?Patient Name: ?Date of Service: ?[name], [name] RTHA Y. 04/17/2022 12:30 PM ?Medical Record Number: [id number] ?Patient Account Number: [id number] ?Date of Birth/Sex: ?Treating RN: ?June 07, 1956 (66 y.o. F) Boehlein, Linda ?Primary Care Provider: [name] ?Other Clinician: ?Referring Provider: ?Treating Provider/Extender: Fredirick Maudlin ?[name] ?Weeks in Treatment: 13 ?Debridement Performed for Assessment: Wound #22 Left Calcaneus ?Performed By: Physician Fredirick Maudlin, MD ?Debridement Type: Debridement ?Level of Consciousness (Pre-procedure): Awake and Alert ?Pre-procedure Verification/Time Out Yes - 13:10 ?Taken: ?Start Time: 13:11 ?T Area Debrided (L x W): ?otal 3 (cm) x 4 (cm) = 12 (cm?) ?Tissue and other material debrided: Viable, Non-Viable, Slough, Subcutaneous, Slough ?Level: Skin/Subcutaneous Tissue ?Debridement Description: Excisional ?Instrument: Curette ?Specimen: Tissue Culture ?Number of Specimens T aken: 1 ?Bleeding: Minimum ?Hemostasis Achieved: Pressure ?Procedural Pain: Insensate ?Post Procedural Pain: Insensate ?Response to Treatment: Procedure was tolerated well ?Level of Consciousness (Post- Awake and Alert ?procedure): ?Post Debridement Measurements of Total Wound ?Length: (cm) 3 ?Stage: Category/Stage III ?Width: (cm) 4 ?Depth: (cm) 0.1 ?Volume: (cm?)  0.942 ?Character of Wound/Ulcer Post Debridement: Requires Further Debridement ?Post Procedure Diagnosis ?Same as Pre-procedure ?Electronic Signature(s) ?Signed: 04/17/2022 5:05:09 PM By: Fredirick Maudlin MD FACS ?Signed: 04/17/2022 5:49:11 PM By: Baruch Gouty RN, BSN ?Entered By: Baruch Gouty on 04/17/2022 13:17:43 ?-------------------------------------------------------------------------------- ?HPI Details ?Patient Name: ?Date of Service: ?[name], [name] RTHA Y. 04/17/2022 12:30 PM ?Medical Record Number: [id number] ?Patient Account Number: [id number] ?Date of Birth/Sex: ?Treating RN: ?[birth year] (66 y.o. F) ?Primary Care Provider: [name] ?Other Clinician: ?Referring Provider: ?Treating Provider/Extender: Fredirick Maudlin ?[name] ?Weeks in Treatment: 13 ?History of Present Illness ?  HPI Description: Long standing history of pressure ulcers of ischium and posterior calf. Has LAL mattress. Has home health and aide. Prealbumin 18 last ?check, states taking carnation breakfast once daily. ?11/01/15 Last seen 4 weeks ago. Current collagen to wounds. Was discharged from prior home health due to non compliance. Has healed calf ulcer.Left heel ?from wearing Ugg boots. ?12/20/15; the only area that remains according to the patient is on the posterior aspect of her left ankle over the Achilles area although this looks very healthy. ?Apparently she has no open wounds on her buttock's or her right leg ?01/17/16; the area of the we were currently treating his on the posterior aspect of her left ankle. This is just about closed. I did a light surface debridement here. ?She shows as in the wound today on her posterior thigh just above her antecubital fossa on the right. The wound itself looks clean. It looks like it is probably ?pressure with her wheelchair and she although the patient thinks that this is a [employer] fast streamer strap injury. [place] has been dressing this with calcium ?alginate for the last 2 weeks.  Using collagen on the left lateral leg ?02/28/16; the patient follows episodically/monthly here for pressure ulcers. Currently she has an area on her posterior thigh which is clearly trauma on her ?wheelchair cushion. She has an area on the left lateral leg and new areas on the right great toe at the tip which are probably pressure areas from a new ?wheelchair according to the patient. As no evidence of infection in either one of these areas ?04/10/16; the patient a follows here episodically for pressure ulcers. She has advanced Homecare but apparently most of the dressings are being done by her ?care attendance at home. The area on her left great toe is healed. The area on her left lateral malleolus has not in fact this is a deep wound and I'm not even ?sure that there isn't exposed bone here. The surface does not look healthy. The area on her right posterior thigh is still open but superficial ?04/24/16; I normally follow this woman monthly however I had some concerns about the area on her left lateral malleolus. X-ray of this area did not show ?osteomyelitis. ?05/22/16; the patient arrives for her monthly visit stating that recently she doesn't feel Santyl has been applied/properly applied to her wounds. She states that ?she rigorously offloads these areas at all times and isn't really certain why they're not healing. Concerned about erythema around the left lateral malleolus ?wound. ?06/05/16 once again the patient arrives with wounds not in very good condition. The area over her left lateral malleolus and right heel both requiring extensive ?debridement very copious amounts necrotic tissue. She has been using Santyl. ?READMISSION ?01/12/2022 ?Mrs. Stracener is now a 66 year old woman who lives in [place]. She has 4 grandchildren still lives reasonably independently with help of family. She was ?here several times previously in 2014, 2015, 2016 and most recently in 2017 from 12/20/2015 through 06/05/2016 with wounds on her  left lateral malleolus and ?right heel. These were pressure ulcers but she was discharged in a nonhealed state. 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 I see that ?she was in [facility]

## 2022-04-25 LAB — PROTIME-INR: INR: 2.5 — AB (ref 0.80–1.20)

## 2022-04-28 ENCOUNTER — Other Ambulatory Visit: Payer: Self-pay | Admitting: Physician Assistant

## 2022-05-07 ENCOUNTER — Other Ambulatory Visit: Payer: Self-pay | Admitting: Physician Assistant

## 2022-05-09 ENCOUNTER — Encounter: Payer: Self-pay | Admitting: Physician Assistant

## 2022-05-09 LAB — POCT INR

## 2022-05-15 ENCOUNTER — Encounter (HOSPITAL_BASED_OUTPATIENT_CLINIC_OR_DEPARTMENT_OTHER): Payer: Medicare Other | Attending: General Surgery | Admitting: General Surgery

## 2022-05-15 DIAGNOSIS — L97822 Non-pressure chronic ulcer of other part of left lower leg with fat layer exposed: Secondary | ICD-10-CM | POA: Insufficient documentation

## 2022-05-15 DIAGNOSIS — L89623 Pressure ulcer of left heel, stage 3: Secondary | ICD-10-CM | POA: Diagnosis present

## 2022-05-15 NOTE — Progress Notes (Signed)
[name] ([id number]) Visit Report for 05/15/2022 Arrival Information Details Patient Name: Date of Service: [place], [name] 05/15/2022 12:30 PM Medical Record Number: [id number] Patient Account Number: [id number] Date of Birth/Sex: Treating RN: 08-23-1956 (66 [name] [name] Primary Care [name]: [name] Other Clinician: Referring [name]: Treating [name]/Extender: Casandra Doffing in Treatment: 10 Visit Information History Since Last Visit Added or deleted any medications: No Patient Arrived: Wheel Chair Any new allergies or adverse reactions: No Arrival Time: 12:37 Had a fall or experienced change in No Accompanied By: sister activities of daily living that may affect Transfer Assistance: None risk of falls: Patient Requires Transmission-Based Precautions: No Signs or symptoms of abuse/neglect since last visito No Patient Has Alerts: Yes Hospitalized since last visit: No Patient Alerts: Patient on Blood Thinner Implantable device outside of the clinic excluding No Coumadin cellular tissue based products placed in the center since last visit: Has Dressing in Place as Prescribed: Yes Has Footwear/Offloading in Place as Prescribed: Yes Left: Other:sage boots Right: Other:sage boots Pain Present Now: No Notes stayed in wheelchair Electronic Signature(s) Signed: 05/15/2022 5:25:36 PM By: Baruch Gouty RN, BSN Entered By: Baruch Gouty on 05/15/2022 12:42:20 -------------------------------------------------------------------------------- Lower Extremity Assessment Details Patient Name: Date of Service: [name], Angela Y. 05/15/2022 12:30 PM Medical Record Number: [id number] Patient Account Number: [id number] Date of Birth/Sex: Treating RN: 17-Oct-1956 (76 [name] [name] Primary Care [name]: [name] Other Clinician: Referring Kawehi Hostetter: Treating [name]/Extender: [name] in Treatment: 17 Edema Assessment Assessed: [Left: No] [Right: No] Edema: [Left: Ye] [Right: s] Calf Left: Right: Point of Measurement: 31 cm From Medial Instep 23.5 cm Ankle Left: Right: Point of Measurement: 7 cm From Medial Instep 21 cm Vascular Assessment Pulses: Dorsalis Pedis Palpable: [Left:Yes] Electronic Signature(s) Signed: 05/15/2022 5:25:36 PM By: Baruch Gouty RN, BSN Entered By: Baruch Gouty on 05/15/2022 12:46:18 -------------------------------------------------------------------------------- Multi Wound Chart Details Patient Name: Date of Service: [name], Angela Y. 05/15/2022 12:30 PM Medical Record Number: [id number] Patient Account Number: [id number] Date of Birth/Sex: Treating RN: 30-Sep-1956 (32 [name] [name] Primary Care [name]: [name] Other Clinician: Referring [name]: Treating [name]/Extender: Casandra Doffing in Treatment: 17 Vital Signs Height(in): 64 Pulse(bpm): 65 Weight(lbs): 175 Blood Pressure(mmHg): 105/70 Body Mass Index(BMI): 30 Temperature(F): 98.2 Respiratory Rate(breaths/min): 18 Photos: [N/A:N/A] Left Calcaneus Left, Posterior Ankle N/A Wound Location: Pressure Injury Pressure Injury N/A Wounding Event: Pressure Ulcer Pressure Ulcer N/A Primary Etiology: Cataracts, Hypotension, Neuropathy, Cataracts, Hypotension, Neuropathy, N/A Comorbid History: Quadriplegia Quadriplegia 11/03/2021 11/03/2021 N/A Date Acquired: 17 17 N/A Walker of Treatment: Open Open N/A Wound Status: No No N/A Wound Recurrence: 3x3.4x0.1 3.5x2.5x0.9 N/A Measurements L x W x D (cm) 8.011 6.872 N/A A (cm) : rea 0.801 6.185 N/A Volume (cm) : -27.50% -62.00% N/A % Reduction in A rea: 57.50% -264.70% N/A % Reduction in Volume: Category/Stage III Category/Stage III N/A Classification: Medium Medium N/A Exudate A mount: Serosanguineous Serosanguineous N/A Exudate Type: red, brown  red, brown N/A Exudate Color: Distinct, outline attached Flat and Intact N/A Wound Margin: None Present (0%) Large (67-100%) N/A Granulation A mount: N/A Red N/A Granulation Quality: Large (67-100%) Small (1-33%) N/A Necrotic A mount: Eschar, Adherent Slough Adherent Slough N/A Necrotic Tissue: Fat Layer (Subcutaneous Tissue): Yes Fat Layer (Subcutaneous Tissue): Yes N/A Exposed Structures: Fascia: No Fascia: No Tendon: No Tendon: No Muscle: No Muscle: No Joint: No Joint: No Bone: No Bone: No None Small (1-33%) N/A Epithelialization: Debridement -  Excisional Debridement - Excisional N/A Debridement: Pre-procedure Verification/Time Out 13:00 13:00 N/A Taken: Subcutaneous, Slough Subcutaneous, Slough N/A Tissue Debrided: Skin/Subcutaneous Tissue Skin/Subcutaneous Tissue N/A Level: 10.2 8.75 N/A Debridement A (sq cm): rea Curette Curette N/A Instrument: Minimum Minimum N/A Bleeding: Pressure Pressure N/A Hemostasis A chieved: 0 0 N/A Procedural Pain: 0 0 N/A Post Procedural Pain: Procedure was tolerated well Procedure was tolerated well N/A Debridement Treatment Response: 3x3.4x0.1 3.5x2.5x0.1 N/A Post Debridement Measurements L x W x D (cm) 0.801 0.687 N/A Post Debridement Volume: (cm) Category/Stage III Category/Stage III N/A Post Debridement Stage: Debridement Debridement N/A Procedures Performed: Treatment Notes Electronic Signature(s) Signed: 05/15/2022 1:10:31 PM By: Fredirick Maudlin MD FACS Signed: 05/15/2022 5:25:36 PM By: Baruch Gouty RN, BSN Entered By: Fredirick Maudlin on 05/15/2022 13:10:31 -------------------------------------------------------------------------------- Multi-Disciplinary Care Plan Details Patient Name: Date of Service: [name], Angela [place] RTHA Y. 05/15/2022 12:30 PM Medical Record Number: [id number] Patient Account Number: [id number] Date of Birth/Sex: Treating RN: 10/19/1956 (24 [name] [name] Primary Care Amaira Safley:  [name] Other Clinician: Referring Tulip Meharg: Treating Lasonja Lakins/Extender: Casandra Doffing in Treatment: 34 Multidisciplinary Care Plan reviewed with physician Active Inactive Pressure Nursing Diagnoses: Knowledge deficit related to causes and risk factors for pressure ulcer development Knowledge deficit related to management of pressures ulcers Potential for impaired tissue integrity related to pressure, friction, moisture, and shear Goals: Patient/caregiver will verbalize understanding of pressure ulcer management Date Initiated: 03/20/2022 Target Resolution Date: 06/12/2022 Goal Status: Active Interventions: Assess: immobility, friction, shearing, incontinence upon admission and as needed Assess offloading mechanisms upon admission and as needed Assess potential for pressure ulcer upon admission and as needed Notes: Wound/Skin Impairment Nursing Diagnoses: Impaired tissue integrity Knowledge deficit related to smoking impact on wound healing Goals: Patient/caregiver will verbalize understanding of skin care regimen Date Initiated: 01/12/2022 Target Resolution Date: 06/12/2022 Goal Status: Active Interventions: Assess patient/caregiver ability to obtain necessary supplies Assess patient/caregiver ability to perform ulcer/skin care regimen upon admission and as needed Assess ulceration(s) every visit Provide education on smoking Provide education on ulcer and skin care Notes: Electronic Signature(s) Signed: 05/15/2022 5:25:36 PM By: Baruch Gouty RN, BSN Entered By: Baruch Gouty on 05/15/2022 12:56:34 -------------------------------------------------------------------------------- Pain Assessment Details Patient Name: Date of Service: [name], [place] Y. 05/15/2022 12:30 PM Medical Record Number: [id number] Patient Account Number: [id number] Date of Birth/Sex: Treating RN: [birth year] (24 [name] [name] Primary Care Inocencia Murtaugh:  [name] Other Clinician: Referring Jevante Hollibaugh: Treating Carlotta Telfair/Extender: Casandra Doffing in Treatment: 17 Active Problems Location of Pain Severity and Description of Pain Patient Has Paino No Site Locations Rate the pain. Current Pain Level: 0 Pain Management and Medication Current Pain Management: Electronic Signature(s) Signed: 05/15/2022 5:25:36 PM By: Baruch Gouty RN, BSN Entered By: Baruch Gouty on 05/15/2022 12:42:55 -------------------------------------------------------------------------------- Patient/Caregiver Education Details Patient Name: Date of Service: [name], [place] Cheryln Manly 6/12/2023andnbsp12:30 PM Medical Record Number: [id number] Patient Account Number: [id number] Date of Birth/Gender: Treating RN: [birth year] (67 [name] [name] Primary Care Physician: [name] Other Clinician: Referring Physician: Treating Physician/Extender: Casandra Doffing in Treatment: 17 Education Assessment Education Provided To: Patient Education Topics Provided Pressure: Methods: Explain/Verbal Responses: Reinforcements needed, State content correctly Wound/Skin Impairment: Methods: Explain/Verbal Responses: Reinforcements needed, State content correctly Electronic Signature(s) Signed: 05/15/2022 5:25:36 PM By: Baruch Gouty RN, BSN Entered By: Baruch Gouty on 05/15/2022 12:56:56 -------------------------------------------------------------------------------- Wound Assessment Details Patient Name: Date of Service: [name], [place] Y. 05/15/2022 12:30 PM Medical Record Number: [id number] Patient Account Number: [id number]  Date of Birth/Sex: Treating RN: Jul 04, 1956 (45 [name] [name] Primary Care Adalae Baysinger: [name] Other Clinician: Referring Haig Gerardo: Treating Kolbie Clarkston/Extender: [name] in Treatment: 17 Wound Status Wound Number: 22 Primary  Etiology: Pressure Ulcer Wound Location: Left Calcaneus Wound Status: Open Wounding Event: Pressure Injury Comorbid History: Cataracts, Hypotension, Neuropathy, Quadriplegia Date Acquired: 11/03/2021 Walker Of Treatment: 17 Clustered Wound: No Photos Wound Measurements Length: (cm) 3 Width: (cm) 3.4 Depth: (cm) 0.1 Area: (cm) 8.011 Volume: (cm) 0.801 % Reduction in Area: -27.5% % Reduction in Volume: 57.5% Epithelialization: None Tunneling: No Undermining: No Wound Description Classification: Category/Stage III Wound Margin: Distinct, outline attached Exudate Amount: Medium Exudate Type: Serosanguineous Exudate Color: red, brown Foul Odor After Cleansing: No Slough/Fibrino Yes Wound Bed Granulation Amount: None Present (0%) Exposed Structure Necrotic Amount: Large (67-100%) Fascia Exposed: No Necrotic Quality: Eschar, Adherent Slough Fat Layer (Subcutaneous Tissue) Exposed: Yes Tendon Exposed: No Muscle Exposed: No Joint Exposed: No Bone Exposed: No Electronic Signature(s) Signed: 05/15/2022 5:25:36 PM By: Baruch Gouty RN, BSN Entered By: Baruch Gouty on 05/15/2022 12:54:44 -------------------------------------------------------------------------------- Wound Assessment Details Patient Name: Date of Service: Angela Balzarine, [name] 05/15/2022 12:30 PM Medical Record Number: [id number] Patient Account Number: [id number] Date of Birth/Sex: Treating RN: 06-15-1956 (68 [name] [name] Primary Care Ellery Tash: [name] Other Clinician: Referring Scotty Pinder: Treating Wilhelmine Krogstad/Extender: [name] in Treatment: 17 Wound Status Wound Number: 23 Primary Etiology: Pressure Ulcer Wound Location: Left, Posterior Ankle Wound Status: Open Wounding Event: Pressure Injury Comorbid History: Cataracts, Hypotension, Neuropathy, Quadriplegia Date Acquired: 11/03/2021 Walker Of Treatment: 17 Clustered Wound: No Photos Wound  Measurements Length: (cm) 3.5 Width: (cm) 2.5 Depth: (cm) 0.9 Area: (cm) 6.872 Volume: (cm) 6.185 % Reduction in Area: -62% % Reduction in Volume: -264.7% Epithelialization: Small (1-33%) Tunneling: No Undermining: No Wound Description Classification: Category/Stage III Wound Margin: Flat and Intact Exudate Amount: Medium Exudate Type: Serosanguineous Exudate Color: red, brown Foul Odor After Cleansing: No Slough/Fibrino Yes Wound Bed Granulation Amount: Large (67-100%) Exposed Structure Granulation Quality: Red Fascia Exposed: No Necrotic Amount: Small (1-33%) Fat Layer (Subcutaneous Tissue) Exposed: Yes Necrotic Quality: Adherent Slough Tendon Exposed: No Muscle Exposed: No Joint Exposed: No Bone Exposed: No Electronic Signature(s) Signed: 05/15/2022 5:25:36 PM By: Baruch Gouty RN, BSN Entered By: Baruch Gouty on 05/15/2022 12:55:28 -------------------------------------------------------------------------------- Vitals Details Patient Name: Date of Service: Angela Balzarine, [name] 05/15/2022 12:30 PM Medical Record Number: [id number] Patient Account Number: [id number] Date of Birth/Sex: Treating RN: 1956/02/27 (78 [name] [name] Primary Care Piero Mustard: [name] Other Clinician: Referring [place] Gustafson: Treating Flynn Lininger/Extender: Casandra Doffing in Treatment: 17 Vital Signs Time Taken: 12:42 Temperature (F): 98.2 Height (in): 64 Pulse (bpm): 65 Source: Stated Respiratory Rate (breaths/min): 18 Weight (lbs): 175 Blood Pressure (mmHg): 105/70 Source: Stated Reference Range: 80 - 120 mg / dl Body Mass Index (BMI): 30 Electronic Signature(s) Signed: 05/15/2022 5:25:36 PM By: Baruch Gouty RN, BSN Entered By: Baruch Gouty on 05/15/2022 12:42:47

## 2022-05-15 NOTE — Progress Notes (Signed)
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 necrotic tissue in the calcaneal wound, along with adherent slough. The Achilles wound is better with just some slough present. 02/20/2022: I took another culture at her last visit. This showed only some yeast, no bacteria. She has still been using the [facility] antibiotic and Hydrofera Blue. She is concerned about the cost of her biweekly visits and the debridements that occur. She is wondering if we can go back to [facility] and stretch her visits out to once a month. 03/20/2022: At her last visit, per her request, we changed her dressing back to [facility] under Hydrofera Blue. She reports that her home health nurses continue to not cut the [facility] Blue to fit the wound and instead just place it over the wound. The Santyl is being applied to the [employer] and so the patient does not think it is actually contacting her wounds. 04/17/2022: Today, both wounds are bigger and there was a strong odor on her dressings. There is also some greenish discharge present. We have been using Santyl under Hydrofera Blue. The patient  has numerous complaints about her home health care providers today, but this is fairly typical. 05/15/2022: Last visit, I was concerned for infection and took a culture. Based upon these results, we ordered a new Keystone topical antibiotic. We have been using that with silver alginate on her wounds. Both wounds are smaller today. The intake nurse was concerned about some black-looking discoloration on her heel. On further inspection, it actually ended up being old hematoma. Patient History Family History Unknown History. 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 Suprapubic catheter Musculoskeletal Osteoporosis, decubitis ulcer of sacral region (stage 1); Objective Constitutional No acute distress.. Vitals Time Taken: 12:42 PM, Height: 64 in, Source: Stated, Weight: 175 lbs, Source: Stated, BMI: 30, Temperature: 98.2 F, Pulse: 65 bpm, Respiratory Rate: 18 breaths/min, Blood Pressure: 105/70 mmHg. Respiratory Normal work of breathing on room air.. General Notes: 05/15/2022: Both wounds are smaller today. The intake nurse was concerned about some black-looking discoloration on her heel. On further inspection, it actually ended up being old hematoma. Both wound surfaces are red and beefy. Integumentary (Hair, Skin) Wound #22 status is Open. Original cause of wound was Pressure Injury. The date acquired was: 11/03/2021. The wound has been in treatment 17 weeks. The wound is located on the Left Calcaneus. The wound measures 3cm length x 3.4cm width x 0.1cm depth; 8.011cm^2  area and 0.801cm^3 volume. There is Fat  Layer (Subcutaneous Tissue) exposed. There is no tunneling or undermining noted. There is a medium amount of serosanguineous drainage noted. The wound margin is distinct with the outline attached to the wound base. There is no granulation within the wound bed. There is a large (67-100%) amount of necrotic tissue within the wound bed including Eschar and Adherent Slough. Wound #23 status is Open. Original cause of wound was Pressure Injury. The date acquired was: 11/03/2021. The wound has been in treatment 17 weeks. The wound is located on the Left,Posterior Ankle. The wound measures 3.5cm length x 2.5cm width x 0.9cm depth; 6.872cm^2 area and 6.185cm^3 volume. There is Fat Layer (Subcutaneous Tissue) exposed. There is no tunneling or undermining noted. There is a medium amount of serosanguineous drainage noted. The wound margin is flat and intact. There is large (67-100%) red granulation within the wound bed. There is a small (1-33%) amount of necrotic tissue within the wound bed including Adherent Slough. 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 There was a Excisional Skin/Subcutaneous Tissue Debridement with a total area of 10.2 sq cm performed by Fredirick Maudlin, MD. With the following instrument(s): Curette to remove Viable and Non-Viable tissue/material. Material removed includes Subcutaneous Tissue and Slough and. No specimens were taken. A time out was conducted at 13:00, prior to the start of the procedure. A Minimum amount of bleeding was controlled with Pressure. The procedure was tolerated well with a pain level of 0 throughout and a pain level of  0 following the procedure. Post Debridement Measurements: 3cm length x 3.4cm width x 0.1cm depth; 0.801cm^3 volume. Post debridement Stage noted as Category/Stage III. Character of Wound/Ulcer Post Debridement is improved. Post procedure Diagnosis Wound #22: Same as Pre-Procedure Wound #23 Pre-procedure diagnosis of Wound #23 is a Pressure Ulcer located on the Left,Posterior Ankle . There was a Excisional Skin/Subcutaneous Tissue Debridement with a total area of 8.75 sq cm performed by Fredirick Maudlin, MD. With the following instrument(s): Curette to remove Viable and Non-Viable tissue/material. Material removed includes Subcutaneous Tissue and Slough and. No specimens were taken. A time out was conducted at 13:00, prior to the start of the procedure. A Minimum amount of bleeding was controlled with Pressure. The procedure was tolerated well with a pain level of 0 throughout and a pain level of 0 following the procedure. Post Debridement Measurements: 3.5cm length x 2.5cm width x 0.1cm depth; 0.687cm^3 volume. Post debridement Stage noted as Category/Stage III. Character of Wound/Ulcer Post Debridement is improved. Post procedure Diagnosis Wound #23: Same as Pre-Procedure Plan Follow-up Appointments: Return appointment in 1 month. - Dr. Celine Ahr with [facility] 1 Bathing/ Shower/ Hygiene: May shower with protection but do not get wound dressing(s) wet. Off-Loading: Heel suspension boot to: - Wear the Prevalon boots to both feet at all times Home Health: New wound care orders this week; continue Home Health for wound care. May utilize formulary equivalent dressing for wound treatment orders unless otherwise specified. - use silver alginate over [facility] compound Other Home Health Orders/Instructions: [facility] [place] WOUND #22: - Calcaneus Wound Laterality: Left Cleanser: Soap and Water (Home Health) 3 x Per Week/30 Days Discharge Instructions: May shower and wash wound with dial antibacterial soap  and water prior to dressing change. Cleanser:  Wound Cleanser (Home Health) 3 x Per Week/30 Days Discharge Instructions: Cleanse the wound with wound cleanser prior to applying a clean dressing using gauze sponges, not tissue or cotton balls. Peri-Wound Care: Zinc Oxide Ointment 30g tube 3 x Per Week/30 Days Discharge Instructions: Apply Zinc Oxide to periwound with each dressing change Topical: Keystone antibiotic compound 3 x Per Week/30 Days Discharge Instructions: apply to wound bed with dressing changes Prim Dressing: KerraCel Ag Gelling Fiber Dressing, 4x5 in (silver alginate) 3 x Per Week/30 Days ary Discharge Instructions: Apply silver alginate to wound bed as instructed Secondary Dressing: Woven Gauze Sponges 2x2 in 3 x Per Week/30 Days Discharge Instructions: moisten with saline and Apply over santyl to fill the wound Secondary Dressing: Zetuvit Plus Silicone Border Dressing 7x7(in/in) (Home Health) 3 x Per Week/30 Days Discharge Instructions: Apply silicone border over primary dressing as directed. Secured With: 51M Medipore H Soft Cloth Surgical T ape, 4 x 10 (in/yd) (Home Health) 3 x Per Week/30 Days Discharge Instructions: Secure with tape as needed WOUND #23: - Ankle Wound Laterality: Left, Posterior Cleanser: Soap and Water ([place]) 3 x Per Week/30 Days Discharge Instructions: May shower and wash wound with dial antibacterial soap and water prior to dressing change. Cleanser: Wound Cleanser (Home Health) 3 x Per Week/30 Days Discharge Instructions: Cleanse the wound with wound cleanser prior to applying a clean dressing using gauze sponges, not tissue or cotton balls. Peri-Wound Care: Zinc Oxide Ointment 30g tube 3 x Per Week/30 Days Discharge Instructions: Apply Zinc Oxide to periwound with each dressing change Topical: Keystone antibiotic compound 3 x Per Week/30 Days Discharge Instructions: apply to wound bed with dressing changes Prim Dressing: KerraCel Ag Gelling  Fiber Dressing, 4x5 in (silver alginate) 3 x Per Week/30 Days ary Discharge Instructions: Apply silver alginate to wound bed as instructed Secondary Dressing: Woven Gauze Sponges 2x2 in 3 x Per Week/30 Days Discharge Instructions: moisten with saline and Apply over santyl to fill the wound Secondary Dressing: Zetuvit Plus Silicone Border Dressing 7x7(in/in) (Home Health) 3 x Per Week/30 Days Discharge Instructions: Apply silicone border over primary dressing as directed. Secured With: 51M Medipore H Soft Cloth Surgical T ape, 4 x 10 (in/yd) (Home Health) 3 x Per Week/30 Days Discharge Instructions: Secure with tape as needed 05/15/2022: Both wounds are smaller today. The intake nurse was concerned about some black-looking discoloration on her heel. On further inspection, it actually ended up being old hematoma. Both wound surfaces are red and beefy. I used a curette to debride the old hematoma off of the heel wound. I also debrided additional subcutaneous tissue and slough from both wounds. We will continue using her topical Keystone antibiotic with silver alginate. As is her preference, I will see her again in 1 month. Electronic Signature(s) Signed: 05/15/2022 1:14:33 PM By: Fredirick Maudlin MD FACS Entered By: Fredirick Maudlin on 05/15/2022 13:14:33 -------------------------------------------------------------------------------- HxROS Details Patient Name: Date of Service: [name], [place] Y. 05/15/2022 12:30 PM Medical Record Number: [id number] Patient Account Number: [id number] Date of Birth/Sex: Treating RN: 1956/05/21 (66 [name] 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 Suprapubic catheter Musculoskeletal Medical History: Past Medical History Notes: Osteoporosis, decubitis ulcer of sacral region (stage 1); Neurologic Medical History: Positive for: Neuropathy - arm; Quadriplegia Psychiatric Medical History: Negative for: Confinement Anxiety HBO Extended History Items Eyes: Cataracts Immunizations Pneumococcal Vaccine: Received Pneumococcal Vaccination: Yes Received Pneumococcal Vaccination On or After 60th Birthday: No Tetanus Vaccine: Last tetanus shot: 12/04/2010 Implantable Devices No devices added Family and Social History Unknown History: Yes Engineer, maintenance) Signed: 05/15/2022 3:22:33 PM By: Fredirick Maudlin MD FACS Signed: 05/15/2022 5:25:36 PM By: Baruch Gouty RN, BSN Entered By: Fredirick Maudlin on 05/15/2022 13:12:06 -------------------------------------------------------------------------------- SuperBill Details Patient Name: Date of Service: [name], [place] Y. 05/15/2022 Medical Record Number: [id number] Patient Account Number: [id number] Date of Birth/Sex: Treating RN: 06-26-1956 (23 [name] [name] Primary Care Provider: [name] Other Clinician: Referring Provider: Treating Provider/Extender: Lourena Simmonds Weeks in Treatment: 17 Diagnosis Coding ICD-10 Codes Code Description [phone] Pressure ulcer of left heel, stage 3 L97.828 Non-pressure chronic ulcer of other part of left lower leg with other specified severity S14.109S Unspecified injury at unspecified level of cervical spinal cord, sequela Facility Procedures CPT4 Code: [id number] Description: 23300 - DEB SUBQ TISSUE 20 SQ  CM/< ICD-10 Diagnosis Description L89.623 Pressure ulcer of left heel, stage 3 L97.828 Non-pressure chronic ulcer of other part of left lower leg with other specified Modifier: severity Quantity: 1 Physician Procedures : CPT4 Code Description Modifier [id number] - WC PHYS LEVEL 3 - EST PT ICD-10 Diagnosis Description L89.623 Pressure ulcer of left heel, stage 3 L97.828 Non-pressure chronic ulcer of other part of left lower leg with other specified severity S14.109S  Unspecified injury at unspecified level of cervical spinal cord, sequela Quantity: 1 : [id number] 11042 - WC PHYS SUBQ TISS 20 SQ CM ICD-10 Diagnosis Description L89.623 Pressure ulcer of left heel, stage 3 L97.828 Non-pressure chronic ulcer of other part of left lower leg with other specified severity Quantity: 1 Electronic Signature(s) Signed: 05/15/2022 1:14:50 PM By: Fredirick Maudlin MD FACS Entered By: Fredirick Maudlin on 05/15/2022 13:14:50

## 2022-05-22 ENCOUNTER — Ambulatory Visit: Payer: Medicare Other | Admitting: Physician Assistant

## 2022-05-26 ENCOUNTER — Other Ambulatory Visit: Payer: Self-pay | Admitting: Physician Assistant

## 2022-05-26 DIAGNOSIS — G8250 Quadriplegia, unspecified: Secondary | ICD-10-CM

## 2022-05-29 ENCOUNTER — Other Ambulatory Visit: Payer: Self-pay | Admitting: Physician Assistant

## 2022-05-29 DIAGNOSIS — G8250 Quadriplegia, unspecified: Secondary | ICD-10-CM

## 2022-05-30 ENCOUNTER — Encounter: Payer: Self-pay | Admitting: Physician Assistant

## 2022-05-30 LAB — POCT INR: INR: 2.6 (ref 2.0–3.0)

## 2022-06-12 ENCOUNTER — Encounter (HOSPITAL_BASED_OUTPATIENT_CLINIC_OR_DEPARTMENT_OTHER): Payer: Medicare Other | Attending: General Surgery | Admitting: General Surgery

## 2022-06-12 DIAGNOSIS — L89623 Pressure ulcer of left heel, stage 3: Secondary | ICD-10-CM | POA: Diagnosis not present

## 2022-06-12 DIAGNOSIS — L97828 Non-pressure chronic ulcer of other part of left lower leg with other specified severity: Secondary | ICD-10-CM | POA: Insufficient documentation

## 2022-06-12 NOTE — Progress Notes (Signed)
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 02/08/2022: There is a wound on her calcaneus and one just above the Achilles. She has been using Keystone antibiotic and Hydrofera Blue. Home health has been changing the dressing. She reports that 1 nurse has been cutting the Hydrofera Blue to the size of the wound and saturating the wound with [facility] antibiotic prior to applying it; the other nurse has been using an entire square of Hydrofera Blue and using a very minimal amount of the antibiotic. There appears to be some disconnect in the instructions. The wounds are may be a little bit smaller. There remains necrotic tissue in the calcaneal wound, along with adherent slough. The Achilles wound is better with just some slough present. 02/20/2022: I took another culture at her last visit. This showed only some yeast, no bacteria. She has still been using the [facility] antibiotic and Hydrofera Blue. She is concerned about the cost of her biweekly visits and the debridements that occur. She is wondering if we can go back to [facility] and stretch  her visits out to once a month. 03/20/2022: At her last visit, per her request, we changed her dressing back to [facility] under Hydrofera Blue. She reports that her home health nurses continue to not cut the [facility] Blue to fit the wound and instead just place it over the wound. The Santyl is being applied to the [facility] and so the patient does not think it is actually contacting her wounds. 04/17/2022: Today, both wounds are bigger and there was a strong odor on her dressings. There is also some greenish discharge present. We have been using Santyl under Hydrofera Blue. The patient has numerous complaints about her home health care providers today, but this is fairly typical. 05/15/2022: Last visit, I was concerned for infection and took a culture. Based upon these results, we ordered a new Keystone topical antibiotic. We have been using that with silver alginate on her wounds. Both wounds are smaller today. The intake nurse was concerned about some black-looking discoloration on her heel. On further inspection, it actually ended up being old hematoma. 06/12/2022: Both wounds measure a little bit smaller today. They are both fairly friable and bleed easily with manipulation. The heel wound has some additional discolored and boggy tissue on the more distal aspect along with some accumulated slough. The posterior ankle wound now has Achilles tendon exposed, which I do not think has been documented in the past. We are using Keystone topical antibiotic and silver alginate. Patient History Family History Unknown History. 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 Suprapubic catheter Musculoskeletal Osteoporosis, decubitis ulcer of sacral region (stage 1); Objective Constitutional No acute distress.. Vitals Time Taken: 12:49 PM, Height: 64 in, Weight: 175 lbs, BMI: 30, Temperature: 98.1 F, Pulse: 80 bpm, Respiratory Rate: 18 breaths/min, Blood Pressure: 100/69 mmHg. Respiratory Normal work of breathing on room air.. General Notes: 06/12/2022: Both wounds measure a little bit smaller today. They are both fairly friable and bleed easily with manipulation. The heel wound has some additional discolored and boggy tissue on the more distal aspect along with some accumulated slough. The posterior ankle wound now has Achilles tendon exposed, which I do not think has been documented in the past. Integumentary (Hair, Skin) Wound #22 status is Open. Original cause of wound was Pressure Injury. The date acquired was: 11/03/2021. The wound has been in treatment 21 weeks. The wound is located on the Left Calcaneus. The wound measures 2.5cm length x 3.5cm width x 0.1cm depth; 6.872cm^2 area and 0.687cm^3 volume. There is Fat Layer (Subcutaneous Tissue) exposed. There is no tunneling or undermining noted. There is a medium amount of serosanguineous drainage noted. The wound margin is distinct with the outline attached to the wound base. There is medium (34-66%) red, friable granulation within the wound bed. There is a medium (34- 66%) amount of necrotic tissue within the wound bed including Adherent Slough. Wound #23 status is Open. Original cause of wound was Pressure Injury. The date acquired was: 11/03/2021. The wound has been in treatment 21 weeks. The wound is located on the Left,Posterior Ankle. The wound measures 2cm length x 1.9cm width x 0.5cm depth; 2.985cm^2 area and 1.492cm^3 volume. There is tendon and Fat Layer (Subcutaneous Tissue) exposed.  There is no tunneling or undermining noted. There is a medium amount of serosanguineous drainage noted. The wound margin is flat and intact. There is medium (34-66%) red, friable granulation within the wound bed. There is a medium (34-66%) amount of necrotic tissue within the wound bed including Adherent Slough. 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 There was a Excisional Skin/Subcutaneous Tissue Debridement with a total area of 8.75 sq cm performed by Fredirick Maudlin, MD. With the following instrument(s): Curette to remove Viable and Non-Viable tissue/material. Material removed includes Subcutaneous Tissue and Slough and. No specimens were taken. A time out was conducted at 13:15, prior to the start of the procedure. A Minimum amount of bleeding was controlled with Pressure. The procedure was tolerated well with a pain level of Insensate throughout and a pain level of Insensate following the procedure. Post Debridement Measurements: 2.5cm length x 3.5cm width x 0.2cm depth; 1.374cm^3 volume. Post debridement Stage noted as Category/Stage III. Character of Wound/Ulcer Post Debridement is improved. Post procedure Diagnosis Wound #22: Same as Pre-Procedure Wound #23 Pre-procedure diagnosis of Wound #23 is a Pressure Ulcer located on the Left,Posterior Ankle . There was a Excisional Skin/Subcutaneous Tissue Debridement with a total area of 3.8 sq cm performed by Fredirick Maudlin, MD. With the following instrument(s): Curette to remove Viable and Non-Viable tissue/material. Material removed includes Subcutaneous Tissue and Slough and. No specimens were taken. A time out was conducted at 13:15,  prior  to the start of the procedure. A Minimum amount of bleeding was controlled with Pressure. The procedure was tolerated well with a pain level of Insensate throughout and a pain level of Insensate following the procedure. Post Debridement Measurements: 2cm length x 1.9cm width x 0.5cm depth; 1.492cm^3 volume. Post debridement Stage noted as Category/Stage IV. Character of Wound/Ulcer Post Debridement is improved. Post procedure Diagnosis Wound #23: Same as Pre-Procedure Plan Follow-up Appointments: Return appointment in 1 month. - Dr. Celine Ahr with [facility] 1 Bathing/ Shower/ Hygiene: May shower with protection but do not get wound dressing(s) wet. Off-Loading: Heel suspension boot to: - Wear the Prevalon boots to both feet at all times Home Health: No change in wound care orders this week; continue Home Health for wound care. May utilize formulary equivalent dressing for wound treatment orders unless otherwise specified. Other Home Health Orders/Instructions: Jackquline [place] WOUND #22: - Calcaneus Wound Laterality: Left Cleanser: Soap and Water (Home Health) 3 x Per Week/30 Days Discharge Instructions: May shower and wash wound with dial antibacterial soap and water prior to dressing change. Cleanser: Wound Cleanser (Home Health) 3 x Per Week/30 Days Discharge Instructions: Cleanse the wound with wound cleanser prior to applying a clean dressing using gauze sponges, not tissue or cotton balls. Peri-Wound Care: Zinc Oxide Ointment 30g tube 3 x Per Week/30 Days Discharge Instructions: Apply Zinc Oxide to periwound with each dressing change Topical: Keystone antibiotic compound 3 x Per Week/30 Days Discharge Instructions: apply to wound bed with dressing changes Prim Dressing: KerraCel Ag Gelling Fiber Dressing, 4x5 in (silver alginate) 3 x Per Week/30 Days ary Discharge Instructions: Apply silver alginate to wound bed as instructed Secondary Dressing: Woven Gauze Sponges 2x2 in 3 x Per  Week/30 Days Discharge Instructions: moisten with saline and Apply over santyl to fill the wound Secondary Dressing: Zetuvit Plus Silicone Border Dressing 7x7(in/in) (Home Health) 3 x Per Week/30 Days Discharge Instructions: Apply silicone border over primary dressing as directed. Secured With: 28M Medipore H Soft Cloth Surgical T ape, 4 x 10 (in/yd) (Home Health) 3 x Per Week/30 Days Discharge Instructions: Secure with tape as needed WOUND #23: - Ankle Wound Laterality: Left, Posterior Cleanser: Soap and Water ([place]) 3 x Per Week/30 Days Discharge Instructions: May shower and wash wound with dial antibacterial soap and water prior to dressing change. Cleanser: Wound Cleanser (Home Health) 3 x Per Week/30 Days Discharge Instructions: Cleanse the wound with wound cleanser prior to applying a clean dressing using gauze sponges, not tissue or cotton balls. Peri-Wound Care: Zinc Oxide Ointment 30g tube 3 x Per Week/30 Days Discharge Instructions: Apply Zinc Oxide to periwound with each dressing change Topical: Keystone antibiotic compound 3 x Per Week/30 Days Discharge Instructions: apply to wound bed with dressing changes Prim Dressing: KerraCel Ag Gelling Fiber Dressing, 4x5 in (silver alginate) 3 x Per Week/30 Days ary Discharge Instructions: Apply silver alginate to wound bed, be sure to pack wound with alginate so in contact with wound bed Secondary Dressing: Woven Gauze Sponges 2x2 in 3 x Per Week/30 Days Discharge Instructions: moisten with saline and Apply over santyl to fill the wound Secondary Dressing: Zetuvit Plus Silicone Border Dressing 7x7(in/in) (Home Health) 3 x Per Week/30 Days Discharge Instructions: Apply silicone border over primary dressing as directed. Secured With: 28M Medipore H Soft Cloth Surgical T ape, 4 x 10 (in/yd) (Home Health) 3 x Per Week/30 Days Discharge Instructions: Secure with tape as needed 06/12/2022: Both wounds measure a little bit smaller today. They  are both fairly friable and bleed easily with manipulation. The heel wound has some additional discolored and boggy tissue on the more distal aspect along with some accumulated slough. The posterior ankle wound now has Achilles tendon exposed, which I do not think has been documented in the past. I used a curette to debride slough and nonviable subcutaneous tissue from both wounds. The patient reports all of the measures that have been taken to avoid pressure on her limbs, but nonetheless there still seems to be something happening causing the deepening of the posterior ankle wound and the additional area of deep tissue injury on her calcaneus. We will continue using the topical Keystone and silver alginate. It was emphasized that the silver alginate really needs to be cut small enough to directly contact the wound bed in both sites. Follow-up in 1 week. Electronic Signature(s) Signed: 06/12/2022 1:30:54 PM By: Fredirick Maudlin MD FACS Entered By: Fredirick Maudlin on 06/12/2022 13:30:54 -------------------------------------------------------------------------------- HxROS Details Patient Name: Date of Service: [name], [place] RTHA Y. 06/12/2022 12:30 PM Medical Record Number: [id number] Patient Account Number: [id number] Date of Birth/Sex: Treating RN: Apr 21, 1956 (66 y.o. 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 Suprapubic catheter Musculoskeletal Medical History: Past Medical History Notes: Osteoporosis, decubitis ulcer of sacral region (stage 1); Neurologic Medical History: Positive for: Neuropathy - arm; Quadriplegia Psychiatric Medical History: Negative for: Confinement Anxiety HBO Extended History Items Eyes: Cataracts Immunizations Pneumococcal Vaccine: Received Pneumococcal Vaccination: Yes Received Pneumococcal Vaccination On or After 60th Birthday: No Tetanus Vaccine: Last tetanus shot: 12/04/2010 Implantable Devices No devices added Family and Social History Unknown History: Yes Engineer, maintenance) Signed: 06/12/2022 1:48:23 PM By: Fredirick Maudlin MD FACS Signed: 06/12/2022 5:47:34 PM By: Angela Gouty RN, BSN Entered By: Fredirick Maudlin on 06/12/2022 13:28:37 -------------------------------------------------------------------------------- SuperBill Details Patient Name: Date of Service: [name], [place]. 06/12/2022 Medical Record Number: [id number] Patient Account Number: [id number] Date of Birth/Sex: Treating RN: [birth year] (66 y.o. [name] Primary Care Provider: [name] Other Clinician: Referring Provider: Treating Provider/Extender: Lourena Simmonds Weeks in Treatment: 21 Diagnosis Coding ICD-10 Codes Code Description [phone] Pressure ulcer of left heel, stage 3 L97.828 Non-pressure chronic ulcer of other part of left lower leg with other specified severity S14.109S Unspecified injury at unspecified level of cervical spinal cord, sequela Facility Procedures CPT4 Code: [id number] Description: 58850 - DEB SUBQ TISSUE 20 SQ CM/< ICD-10 Diagnosis Description L89.623 Pressure ulcer of left heel, stage 3 L97.828 Non-pressure chronic ulcer of other part of left lower leg with other specified Modifier: severity Quantity: 1 Physician Procedures : CPT4 Code Description Modifier [id number] - WC  PHYS LEVEL 3 - EST PT 25 ICD-10 Diagnosis Description L89.623 Pressure ulcer of left heel, stage 3 L97.828 Non-pressure chronic ulcer of other part of left lower leg with other specified severity  S14.109S Unspecified injury at unspecified level of cervical spinal cord, sequela Quantity: 1 : [id number] 11042 - WC PHYS SUBQ TISS 20 SQ CM ICD-10 Diagnosis Description L89.623 Pressure ulcer of left heel, stage 3 L97.828 Non-pressure chronic ulcer  of other part of left lower leg with other specified severity Quantity: 1 Electronic Signature(s) Signed: 06/12/2022 1:31:16 PM By: Fredirick Maudlin MD FACS Entered By: Fredirick Maudlin on 06/12/2022 13:31:16

## 2022-06-12 NOTE — Progress Notes (Signed)
ADELAE, YODICE ([id number]) Visit Report for 06/12/2022 Arrival Information Details Patient Name: Date of Service: [place], [name] 06/12/2022 12:30 PM Medical Record Number: [id number] Patient Account Number: [id number] Date of Birth/Sex: Treating RN: [birth year] (66 y.o. [name] Primary Care Hajira Verhagen: [name] Other Clinician: Referring Daylin Gruszka: Treating Ralph Benavidez/Extender: Casandra Doffing in Treatment: 21 Visit Information History Since Last Visit Added or deleted any medications: No Patient Arrived: Wheel Chair Any new allergies or adverse reactions: No Arrival Time: 12:47 Had a fall or experienced change in No Accompanied By: self activities of daily living that may affect Transfer Assistance: None risk of falls: Patient Identification Verified: Yes Signs or symptoms of abuse/neglect since last visito No Secondary Verification Process Completed: Yes Hospitalized since last visit: No Patient Requires Transmission-Based Precautions: No Implantable device outside of the clinic excluding No Patient Has Alerts: Yes cellular tissue based products placed in the center Patient Alerts: Patient on Blood Thinner since last visit: Coumadin Has Dressing in Place as Prescribed: Yes Pain Present Now: No Notes stays in wheelchair Electronic Signature(s) Signed: 06/12/2022 5:47:34 PM By: Baruch Gouty RN, BSN Entered By: Baruch Gouty on 06/12/2022 12:48:33 -------------------------------------------------------------------------------- Encounter Discharge Information Details Patient Name: Date of Service: [name], [place] Y. 06/12/2022 12:30 PM Medical Record Number: [id number] Patient Account Number: [id number] Date of Birth/Sex: Treating RN: [birth year] (66 y.o. [name] Primary Care Beryle Zeitz: [name] Other Clinician: Referring Gladyes Kudo: Treating Anjelina Dung/Extender: Casandra Doffing in Treatment:  21 Encounter Discharge Information Items Post Procedure Vitals Discharge Condition: Stable Temperature (F): 98.1 Ambulatory Status: Wheelchair Pulse (bpm): 80 Discharge Destination: Home Respiratory Rate (breaths/min): 18 Transportation: Private Auto Blood Pressure (mmHg): 100/69 Accompanied By: self Schedule Follow-up Appointment: Yes Clinical Summary of Care: Patient Declined Electronic Signature(s) Signed: 06/12/2022 5:47:34 PM By: Baruch Gouty RN, BSN Entered By: Baruch Gouty on 06/12/2022 13:39:32 -------------------------------------------------------------------------------- Lower Extremity Assessment Details Patient Name: Date of Service: [name], [name] 06/12/2022 12:30 PM Medical Record Number: [id number] Patient Account Number: [id number] Date of Birth/Sex: Treating RN: [birth year] (66 y.o. [name] Primary Care Lavern Crimi: [name] Other Clinician: Referring Zavon Hyson: Treating Tanuj Mullens/Extender: Lourena Simmonds Weeks in Treatment: 21 Edema Assessment Assessed: [Left: No] [Right: No] Edema: [Left: Ye] [Right: s] Calf Left: Right: Point of Measurement: 31 cm From Medial Instep 23.5 cm Ankle Left: Right: Point of Measurement: 7 cm From Medial Instep 21 cm Vascular Assessment Pulses: Dorsalis Pedis Palpable: [Left:Yes] Electronic Signature(s) Signed: 06/12/2022 5:47:34 PM By: Baruch Gouty RN, BSN Entered By: Baruch Gouty on 06/12/2022 13:04:50 -------------------------------------------------------------------------------- Multi Wound Chart Details Patient Name: Date of Service: [name], [place] Y. 06/12/2022 12:30 PM Medical Record Number: [id number] Patient Account Number: [id number] Date of Birth/Sex: Treating RN: 1956/07/12 (66 y.o. Martyn Malay, Linda Primary Care Lyrah Bradt: [name] Other Clinician: Referring Ryler Laskowski: Treating Wilhelm Ganaway/Extender: Casandra Doffing in Treatment:  21 Vital Signs Height(in): 64 Pulse(bpm): 80 Weight(lbs): 175 Blood Pressure(mmHg): 100/69 Body Mass Index(BMI): 30 Temperature(F): 98.1 Respiratory Rate(breaths/min): 18 Photos: [22:Left Calcaneus] [23:Left, Posterior Ankle] [N/A:N/A N/A] Wound Location: [22:Pressure Injury] [23:Pressure Injury] [N/A:N/A] Wounding Event: [22:Pressure Ulcer] [23:Pressure Ulcer] [N/A:N/A] Primary Etiology: [22:Cataracts, Hypotension, Neuropathy, Cataracts, Hypotension, Neuropathy, N/A] Comorbid History: [22:Quadriplegia 11/03/2021] [23:Quadriplegia 11/03/2021] [N/A:N/A] Date Acquired: [22:21] [23:21] [N/A:N/A] Weeks of Treatment: [22:Open] [23:Open] [N/A:N/A] Wound Status: [22:No] [23:No] [N/A:N/A] Wound Recurrence: [22:2.5x3.5x0.1] [23:2x1.9x0.5] [N/A:N/A] Measurements L x W x D (cm) [21:1.941] [23:2.985] [N/A:N/A] A (cm) : rea [22:0.687] [23:1.492] [N/A:N/A] Volume (cm) : [22:-9.40%] [  23:29.60%] [N/A:N/A] % Reduction in A [22:rea: 63.60%] [23:12.00%] [N/A:N/A] % Reduction in Volume: [22:Category/Stage III] [23:Category/Stage IV] [N/A:N/A] Classification: [22:Medium] [23:Medium] [N/A:N/A] Exudate A mount: [22:Serosanguineous] [23:Serosanguineous] [N/A:N/A] Exudate Type: [22:red, brown] [23:red, brown] [N/A:N/A] Exudate Color: [22:Distinct, outline attached] [23:Flat and Intact] [N/A:N/A] Wound Margin: [22:Medium (34-66%)] [23:Medium (34-66%)] [N/A:N/A] Granulation A mount: [22:Red, Friable] [23:Red, Friable] [N/A:N/A] Granulation Quality: [22:Medium (34-66%)] [23:Medium (34-66%)] [N/A:N/A] Necrotic A mount: [22:Fat Layer (Subcutaneous Tissue): Yes Fat Layer (Subcutaneous Tissue): Yes N/A] Exposed Structures: [22:Fascia: No Tendon: No Muscle: No Joint: No Bone: No Small (1-33%)] [23:Tendon: Yes Fascia: No Muscle: No Joint: No Bone: No Small (1-33%)] [N/A:N/A] Epithelialization: [22:Debridement - Excisional] [23:Debridement - Excisional] [N/A:N/A] Debridement: Pre-procedure Verification/Time Out  13:15 [23:13:15] [N/A:N/A] Taken: [22:Subcutaneous, Slough] [23:Subcutaneous, Slough] [N/A:N/A] Tissue Debrided: [22:Skin/Subcutaneous Tissue] [23:Skin/Subcutaneous Tissue] [N/A:N/A] Level: [22:8.75] [23:3.8] [N/A:N/A] Debridement A (sq cm): [22:rea Curette] [23:Curette] [N/A:N/A] Instrument: [22:Minimum] [23:Minimum] [N/A:N/A] Bleeding: [22:Pressure] [23:Pressure] [N/A:N/A] Hemostasis A chieved: [22:Insensate] [23:Insensate] [N/A:N/A] Procedural Pain: [22:Insensate] [23:Insensate] [N/A:N/A] Post Procedural Pain: [22:Procedure was tolerated well] [23:Procedure was tolerated well] [N/A:N/A] Debridement Treatment Response: [22:2.5x3.5x0.2] [23:2x1.9x0.5] [N/A:N/A] Post Debridement Measurements L x W x D (cm) [22:1.374] [23:1.492] [N/A:N/A] Post Debridement Volume: (cm) [22:Category/Stage III] [23:Category/Stage IV] [N/A:N/A] Post Debridement Stage: [22:Debridement] [23:Debridement] [N/A:N/A] Treatment Notes Electronic Signature(s) Signed: 06/12/2022 1:26:48 PM By: Fredirick Maudlin MD FACS Signed: 06/12/2022 5:47:34 PM By: Baruch Gouty RN, BSN Entered By: Fredirick Maudlin on 06/12/2022 13:26:47 -------------------------------------------------------------------------------- Multi-Disciplinary Care Plan Details Patient Name: Date of Service: [name], [name] 06/12/2022 12:30 PM Medical Record Number: [id number] Patient Account Number: [id number] Date of Birth/Sex: Treating RN: [birth year] (66 y.o. [name] Primary Care Zaccheaus Storlie: [name] Other Clinician: Referring Lameka Disla: Treating Hidaya Daniel/Extender: Casandra Doffing in Treatment: 21 Multidisciplinary Care Plan reviewed with physician Active Inactive Pressure Nursing Diagnoses: Knowledge deficit related to causes and risk factors for pressure ulcer development Knowledge deficit related to management of pressures ulcers Potential for impaired tissue integrity related to pressure, friction,  moisture, and shear Goals: Patient/caregiver will verbalize understanding of pressure ulcer management Date Initiated: 03/20/2022 Target Resolution Date: 07/17/2022 Goal Status: Active Interventions: Assess: immobility, friction, shearing, incontinence upon admission and as needed Assess offloading mechanisms upon admission and as needed Assess potential for pressure ulcer upon admission and as needed Notes: Wound/Skin Impairment Nursing Diagnoses: Impaired tissue integrity Knowledge deficit related to smoking impact on wound healing Goals: Patient/caregiver will verbalize understanding of skin care regimen Date Initiated: 01/12/2022 Target Resolution Date: 07/17/2022 Goal Status: Active Interventions: Assess patient/caregiver ability to obtain necessary supplies Assess patient/caregiver ability to perform ulcer/skin care regimen upon admission and as needed Assess ulceration(s) every visit Provide education on smoking Provide education on ulcer and skin care Notes: Electronic Signature(s) Signed: 06/12/2022 5:47:34 PM By: Baruch Gouty RN, BSN Entered By: Baruch Gouty on 06/12/2022 13:06:20 -------------------------------------------------------------------------------- Pain Assessment Details Patient Name: Date of Service: [name], [place] Y. 06/12/2022 12:30 PM Medical Record Number: [id number] Patient Account Number: [id number] Date of Birth/Sex: Treating RN: [birth year] (66 y.o. [name] Primary Care Sonora Catlin: [name] Other Clinician: Referring Reighan Hipolito: Treating Keri Tavella/Extender: Casandra Doffing in Treatment: 21 Active Problems Location of Pain Severity and Description of Pain Patient Has Paino No Site Locations Rate the pain. Rate the pain. Current Pain Level: 0 Pain Management and Medication Current Pain Management: Electronic Signature(s) Signed: 06/12/2022 5:47:34 PM By: Baruch Gouty RN, BSN Entered By: Baruch Gouty on 06/12/2022 12:50:13 -------------------------------------------------------------------------------- Patient/Caregiver Education Details Patient Name: Date of Service: [name], [place]  Cheryln Manly 7/10/2023andnbsp12:30 PM Medical Record Number: [id number] Patient Account Number: [id number] Date of Birth/Gender: Treating RN: 11-Jun-1956 (66 y.o. [name] Primary Care Physician: [name] Other Clinician: Referring Physician: Treating Physician/Extender: Casandra Doffing in Treatment: 21 Education Assessment Education Provided To: Patient Education Topics Provided Pressure: Methods: Explain/Verbal Responses: Reinforcements needed, State content correctly Wound/Skin Impairment: Methods: Explain/Verbal Responses: Reinforcements needed, State content correctly Electronic Signature(s) Signed: 06/12/2022 5:47:34 PM By: Baruch Gouty RN, BSN Entered By: Baruch Gouty on 06/12/2022 13:06:41 -------------------------------------------------------------------------------- Wound Assessment Details Patient Name: Date of Service: [name], [place] Y. 06/12/2022 12:30 PM Medical Record Number: [id number] Patient Account Number: [id number] Date of Birth/Sex: Treating RN: [birth year] (66 y.o. [name] Primary Care Amariah Kierstead: [name] Other Clinician: Referring Jibril Mcminn: Treating Alwaleed Obeso/Extender: Lourena Simmonds Weeks in Treatment: 21 Wound Status Wound Number: 22 Primary Etiology: Pressure Ulcer Wound Location: Left Calcaneus Wound Status: Open Wounding Event: Pressure Injury Comorbid History: Cataracts, Hypotension, Neuropathy, Quadriplegia Date Acquired: 11/03/2021 Weeks Of Treatment: 21 Clustered Wound: No Photos Wound Measurements Length: (cm) 2.5 Width: (cm) 3.5 Depth: (cm) 0.1 Area: (cm) 6.872 Volume: (cm) 0.687 % Reduction in Area: -9.4% % Reduction in Volume: 63.6% Epithelialization: Small  (1-33%) Tunneling: No Undermining: No Wound Description Classification: Category/Stage III Wound Margin: Distinct, outline attached Exudate Amount: Medium Exudate Type: Serosanguineous Exudate Color: red, brown Foul Odor After Cleansing: No Slough/Fibrino Yes Wound Bed Granulation Amount: Medium (34-66%) Exposed Structure Granulation Quality: Red, Friable Fascia Exposed: No Necrotic Amount: Medium (34-66%) Fat Layer (Subcutaneous Tissue) Exposed: Yes Necrotic Quality: Adherent Slough Tendon Exposed: No Muscle Exposed: No Joint Exposed: No Bone Exposed: No Treatment Notes Wound #22 (Calcaneus) Wound Laterality: Left Cleanser Soap and Water Discharge Instruction: May shower and wash wound with dial antibacterial soap and water prior to dressing change. Wound Cleanser Discharge Instruction: Cleanse the wound with wound cleanser prior to applying a clean dressing using gauze sponges, not tissue or cotton balls. Peri-Wound Care Zinc Oxide Ointment 30g tube Discharge Instruction: Apply Zinc Oxide to periwound with each dressing change Topical Keystone antibiotic compound Discharge Instruction: apply to wound bed with dressing changes Primary Dressing KerraCel Ag Gelling Fiber Dressing, 4x5 in (silver alginate) Discharge Instruction: Apply silver alginate to wound bed as instructed Secondary Dressing Woven Gauze Sponges 2x2 in Discharge Instruction: moisten with saline and Apply over santyl to fill the wound Zetuvit Plus Silicone Border Dressing 7x7(in/in) Discharge Instruction: Apply silicone border over primary dressing as directed. Secured With 74M Medipore H Soft Cloth Surgical T ape, 4 x 10 (in/yd) Discharge Instruction: Secure with tape as needed Compression Wrap Compression Stockings Add-Ons Electronic Signature(s) Signed: 06/12/2022 5:47:34 PM By: Baruch Gouty RN, BSN Entered By: Baruch Gouty on 06/12/2022  13:09:44 -------------------------------------------------------------------------------- Wound Assessment Details Patient Name: Date of Service: [name], [place] Y. 06/12/2022 12:30 PM Medical Record Number: [id number] Patient Account Number: [id number] Date of Birth/Sex: Treating RN: Nov 07, 1956 (66 y.o. [name] Primary Care Tiffiany Beadles: [name] Other Clinician: Referring Pegge Cumberledge: Treating Chriss Redel/Extender: Lourena Simmonds Weeks in Treatment: 21 Wound Status Wound Number: 23 Primary Etiology: Pressure Ulcer Wound Location: Left, Posterior Ankle Wound Status: Open Wounding Event: Pressure Injury Comorbid History: Cataracts, Hypotension, Neuropathy, Quadriplegia Date Acquired: 11/03/2021 Weeks Of Treatment: 21 Clustered Wound: No Photos Wound Measurements Length: (cm) 2 Width: (cm) 1.9 Depth: (cm) 0.5 Area: (cm) 2.985 Volume: (cm) 1.492 % Reduction in Area: 29.6% % Reduction in Volume: 12% Epithelialization: Small (1-33%) Tunneling: No Undermining: No Wound Description Classification: Category/Stage  IV Wound Margin: Flat and Intact Exudate Amount: Medium Exudate Type: Serosanguineous Exudate Color: red, brown Foul Odor After Cleansing: No Slough/Fibrino Yes Wound Bed Granulation Amount: Medium (34-66%) Exposed Structure Granulation Quality: Red, Friable Fascia Exposed: No Necrotic Amount: Medium (34-66%) Fat Layer (Subcutaneous Tissue) Exposed: Yes Necrotic Quality: Adherent Slough Tendon Exposed: Yes Muscle Exposed: No Joint Exposed: No Bone Exposed: No Treatment Notes Wound #23 (Ankle) Wound Laterality: Left, Posterior Cleanser Soap and Water Discharge Instruction: May shower and wash wound with dial antibacterial soap and water prior to dressing change. Wound Cleanser Discharge Instruction: Cleanse the wound with wound cleanser prior to applying a clean dressing using gauze sponges, not tissue or cotton  balls. Peri-Wound Care Zinc Oxide Ointment 30g tube Discharge Instruction: Apply Zinc Oxide to periwound with each dressing change Topical Keystone antibiotic compound Discharge Instruction: apply to wound bed with dressing changes Primary Dressing KerraCel Ag Gelling Fiber Dressing, 4x5 in (silver alginate) Discharge Instruction: Apply silver alginate to wound bed, be sure to pack wound with alginate so in contact with wound bed Secondary Dressing Woven Gauze Sponges 2x2 in Discharge Instruction: moisten with saline and Apply over santyl to fill the wound Zetuvit Plus Silicone Border Dressing 7x7(in/in) Discharge Instruction: Apply silicone border over primary dressing as directed. Secured With 55M Medipore H Soft Cloth Surgical T ape, 4 x 10 (in/yd) Discharge Instruction: Secure with tape as needed Compression Wrap Compression Stockings Add-Ons Electronic Signature(s) Signed: 06/12/2022 5:47:34 PM By: Baruch Gouty RN, BSN Entered By: Baruch Gouty on 06/12/2022 13:20:44 -------------------------------------------------------------------------------- [place] Details Patient Name: Date of Service: [name], [place] Y. 06/12/2022 12:30 PM Medical Record Number: [id number] Patient Account Number: [id number] Date of Birth/Sex: Treating RN: 1956/04/02 (66 y.o. [name] Primary Care Ayona Yniguez: [name] Other Clinician: Referring Alayla Dethlefs: Treating Aul Mangieri/Extender: Casandra Doffing in Treatment: 21 Vital Signs Time Taken: 12:49 Temperature (F): 98.1 Height (in): 64 Pulse (bpm): 80 Weight (lbs): 175 Respiratory Rate (breaths/min): 18 Body Mass Index (BMI): 30 Blood Pressure (mmHg): 100/69 Reference Range: 80 - 120 mg / dl Electronic Signature(s) Signed: 06/12/2022 5:47:34 PM By: Baruch Gouty RN, BSN Entered By: Baruch Gouty on 06/12/2022 12:55:01

## 2022-06-16 NOTE — Patient Instructions (Signed)

## 2022-06-19 ENCOUNTER — Ambulatory Visit (INDEPENDENT_AMBULATORY_CARE_PROVIDER_SITE_OTHER): Payer: Medicare Other | Admitting: Physician Assistant

## 2022-06-19 ENCOUNTER — Encounter: Payer: Self-pay | Admitting: Physician Assistant

## 2022-06-19 VITALS — BP 129/81 | HR 62 | Temp 97.7°F | Ht 64.0 in | Wt 175.0 lb

## 2022-06-19 DIAGNOSIS — E038 Other specified hypothyroidism: Secondary | ICD-10-CM

## 2022-06-19 DIAGNOSIS — G8250 Quadriplegia, unspecified: Secondary | ICD-10-CM

## 2022-06-19 DIAGNOSIS — G894 Chronic pain syndrome: Secondary | ICD-10-CM | POA: Diagnosis not present

## 2022-06-19 DIAGNOSIS — I959 Hypotension, unspecified: Secondary | ICD-10-CM

## 2022-06-19 DIAGNOSIS — N319 Neuromuscular dysfunction of bladder, unspecified: Secondary | ICD-10-CM

## 2022-06-19 DIAGNOSIS — F418 Other specified anxiety disorders: Secondary | ICD-10-CM

## 2022-06-19 DIAGNOSIS — G839 Paralytic syndrome, unspecified: Secondary | ICD-10-CM | POA: Diagnosis not present

## 2022-06-19 DIAGNOSIS — E785 Hyperlipidemia, unspecified: Secondary | ICD-10-CM

## 2022-06-19 DIAGNOSIS — Z7901 Long term (current) use of anticoagulants: Secondary | ICD-10-CM
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
## 2022-06-20 LAB — COMPREHENSIVE METABOLIC PANEL
ALT: 17 IU/L (ref 0–32)
AST: 21 IU/L (ref 0–40)
Albumin/Globulin Ratio: 0.9 — ABNORMAL LOW (ref 1.2–2.2)
Albumin: 3.6 g/dL — ABNORMAL LOW (ref 3.9–4.9)
Alkaline Phosphatase: 110 IU/L (ref 44–121)
BUN/Creatinine Ratio: 25 (ref 12–28)
BUN: 13 mg/dL (ref 8–27)
Bilirubin Total: 0.3 mg/dL (ref 0.0–1.2)
CO2: 27 mmol/L (ref 20–29)
Calcium: 9.1 mg/dL (ref 8.7–10.3)
Chloride: 99 mmol/L (ref 96–106)
Creatinine, Ser: 0.52 mg/dL — ABNORMAL LOW (ref 0.57–1.00)
Globulin, Total: 3.8 g/dL (ref 1.5–4.5)
Glucose: 98 mg/dL (ref 70–99)
Potassium: 3.4 mmol/L — ABNORMAL LOW (ref 3.5–5.2)
Sodium: 143 mmol/L (ref 134–144)
Total Protein: 7.4 g/dL (ref 6.0–8.5)
eGFR: 102 mL/min/{1.73_m2} (ref >59)

## 2022-06-20 LAB — T4, FREE: Free T4: 1.37 ng/dL (ref 0.82–1.77)

## 2022-06-20 LAB — CBC WITH DIFFERENTIAL/PLATELET
Basophils Absolute: 0 10*3/uL (ref 0.0–0.2)
Basos: 1 %
EOS (ABSOLUTE): 0.1 10*3/uL (ref 0.0–0.4)
Eos: 1 %
Hematocrit: 42.1 % (ref 34.0–46.6)
Hemoglobin: 13.9 g/dL (ref 11.1–15.9)
Immature Grans (Abs): 0 10*3/uL (ref 0.0–0.1)
Immature Granulocytes: 0 %
Lymphocytes Absolute: 2 10*3/uL (ref 0.7–3.1)
Lymphs: 26 %
MCH: 31.2 pg (ref 26.6–33.0)
MCHC: 33 g/dL (ref 31.5–35.7)
MCV: 94 fL (ref 79–97)
Monocytes Absolute: 0.6 10*3/uL (ref 0.1–0.9)
Monocytes: 7 %
Neutrophils Absolute: 5.1 10*3/uL (ref 1.4–7.0)
Neutrophils: 65 %
Platelets: 212 10*3/uL (ref 150–450)
RBC: 4.46 x10E6/uL (ref 3.77–5.28)
RDW: 13.5 % (ref 11.7–15.4)
WBC: 7.8 10*3/uL (ref 3.4–10.8)

## 2022-06-20 LAB — HEMOGLOBIN A1C
Est. average glucose Bld gHb Est-mCnc: 105 mg/dL
Hgb A1c MFr Bld: 5.3 % (ref 4.8–5.6)

## 2022-06-20 LAB — LIPID PANEL
Chol/HDL Ratio: 4.1 ratio (ref 0.0–4.4)
Cholesterol, Total: 185 mg/dL (ref 100–199)
HDL: 45 mg/dL (ref >39)
LDL Chol Calc (NIH): 118 mg/dL — ABNORMAL HIGH (ref 0–99)
Triglycerides: 121 mg/dL (ref 0–149)
VLDL Cholesterol Cal: 22 mg/dL (ref 5–40)

## 2022-06-20 LAB — TSH: TSH: 5.11 u[IU]/mL — ABNORMAL HIGH (ref 0.450–4.500)

## 2022-07-11 ENCOUNTER — Encounter (HOSPITAL_BASED_OUTPATIENT_CLINIC_OR_DEPARTMENT_OTHER): Payer: Medicare Other | Attending: General Surgery | Admitting: General Surgery

## 2022-07-11 DIAGNOSIS — L89623 Pressure ulcer of left heel, stage 3: Secondary | ICD-10-CM | POA: Diagnosis present

## 2022-07-11 DIAGNOSIS — L97828 Non-pressure chronic ulcer of other part of left lower leg with other specified severity: Secondary | ICD-10-CM | POA: Diagnosis not present

## 2022-07-11 DIAGNOSIS — G8250 Quadriplegia, unspecified: Secondary | ICD-10-CM | POA: Diagnosis not present

## 2022-07-11 NOTE — Progress Notes (Signed)
[name], JUSTE ([id number]) Visit Report for 07/11/2022 Arrival Information Details Patient Name: Date of Service: [place], [name] 07/11/2022 12:30 PM Medical Record Number: [id number] Patient Account Number: [id number] Date of Birth/Sex: Treating RN: 03/28/1956 (66 y.o. [name] Primary Care Benancio Osmundson: [name] Other Clinician: Referring Denetra Formoso: Treating Damyia Strider/Extender: Casandra Doffing in Treatment: 25 Visit Information History Since Last Visit All ordered tests and consults were completed: Yes Patient Arrived: Ambulatory Added or deleted any medications: No Arrival Time: 12:35 Any new allergies or adverse reactions: No Transfer Assistance: Manual Had a fall or experienced change in No Patient Identification Verified: Yes activities of daily living that may affect Secondary Verification Process Completed: Yes risk of falls: Patient Requires Transmission-Based Precautions: No Signs or symptoms of abuse/neglect since last visito No Patient Has Alerts: Yes Hospitalized since last visit: No Patient Alerts: Patient on Blood Thinner Implantable device outside of the clinic excluding No Coumadin cellular tissue based products placed in the center since last visit: Has Dressing in Place as Prescribed: Yes Pain Present Now: No Electronic Signature(s) Signed: 07/11/2022 4:36:53 PM By: Blanche East RN Entered By: Blanche East on 07/11/2022 12:39:56 -------------------------------------------------------------------------------- Encounter Discharge Information Details Patient Name: Date of Service: [name], [place] Y. 07/11/2022 12:30 PM Medical Record Number: [id number] Patient Account Number: [id number] Date of Birth/Sex: Treating RN: [birth year] (66 y.o. [name] Primary Care Spenser Harren: [name] Other Clinician: Referring Lee Kalt: Treating Chamar Broughton/Extender: Casandra Doffing in Treatment:  25 Encounter Discharge Information Items Post Procedure Vitals Schedule Follow-up Appointment: No Temperature (F): 97.9 Clinical Summary of Care: Pulse (bpm): 55 Respiratory Rate (breaths/min): 18 Blood Pressure (mmHg): 87/47 Notes BP normal for pt; Nonsymptomatic Electronic Signature(s) Signed: 07/11/2022 4:36:53 PM By: Blanche East RN Entered By: Blanche East on 07/11/2022 13:14:21 -------------------------------------------------------------------------------- Lower Extremity Assessment Details Patient Name: Date of Service: [name], [place] Y. 07/11/2022 12:30 PM Medical Record Number: [id number] Patient Account Number: [id number] Date of Birth/Sex: Treating RN: [birth year] (66 y.o. [name] Primary Care Muntaha Vermette: [name] Other Clinician: Referring Kaydie Petsch: Treating Estella Malatesta/Extender: Lourena Simmonds Weeks in Treatment: 25 Edema Assessment Assessed: [Left: No] [Right: No] Edema: [Left: Ye] [Right: s] Calf Left: Right: Point of Measurement: 31 cm From Medial Instep 27.5 cm Ankle Left: Right: Point of Measurement: 7 cm From Medial Instep 20.5 cm Vascular Assessment Pulses: Dorsalis Pedis Palpable: [Left:Yes] Electronic Signature(s) Signed: 07/11/2022 4:36:53 PM By: Blanche East RN Signed: 07/11/2022 6:13:27 PM By: Baruch Gouty RN, BSN Entered By: Blanche East on 07/11/2022 12:49:48 -------------------------------------------------------------------------------- Multi Wound Chart Details Patient Name: Date of Service: [name], [place] Y. 07/11/2022 12:30 PM Medical Record Number: [id number] Patient Account Number: [id number] Date of Birth/Sex: Treating RN: Aug 13, 1956 (66 y.o. [name] Primary Care Maydell Knoebel: [name] Other Clinician: Referring Fahd Galea: Treating Gloris Shiroma/Extender: Casandra Doffing in Treatment: 25 Vital Signs Height(in): 64 Pulse(bpm): 55 Weight(lbs): 175 Blood  Pressure(mmHg): 87/47 Body Mass Index(BMI): 30 Temperature(F): 97.9 Respiratory Rate(breaths/min): 18 Photos: [N/A:N/A] Left Calcaneus Left, Posterior Ankle N/A Wound Location: Pressure Injury Pressure Injury N/A Wounding Event: Pressure Ulcer Pressure Ulcer N/A Primary Etiology: Cataracts, Hypotension, Neuropathy, Cataracts, Hypotension, Neuropathy, N/A Comorbid History: Quadriplegia Quadriplegia 11/03/2021 11/03/2021 N/A Date Acquired: 25 25 N/A Weeks of Treatment: Open Open N/A Wound Status: No No N/A Wound Recurrence: 1.9x3x0.2 1.4x1x0.4 N/A Measurements L x W x D (cm) 4.477 1.1 N/A A (cm) : rea 0.895 0.44 N/A Volume (cm) : 28.70% 74.10% N/A % Reduction in  A rea: 52.50% 74.10% N/A % Reduction in Volume: Category/Stage III Category/Stage IV N/A Classification: Medium Medium N/A Exudate A mount: Serosanguineous Serosanguineous N/A Exudate Type: red, brown red, brown N/A Exudate Color: Distinct, outline attached Flat and Intact N/A Wound Margin: Medium (34-66%) Medium (34-66%) N/A Granulation A mount: Red Red N/A Granulation Quality: Medium (34-66%) Medium (34-66%) N/A Necrotic A mount: Fat Layer (Subcutaneous Tissue): Yes Fat Layer (Subcutaneous Tissue): Yes N/A Exposed Structures: Fascia: No Fascia: No Tendon: No Tendon: No Muscle: No Muscle: No Joint: No Joint: No Bone: No Bone: No Small (1-33%) Small (1-33%) N/A Epithelialization: Debridement - Excisional Debridement - Excisional N/A Debridement: Pre-procedure Verification/Time Out 13:05 13:05 N/A Taken: Subcutaneous, Slough Subcutaneous, Slough N/A Tissue Debrided: Skin/Subcutaneous Tissue Skin/Subcutaneous Tissue N/A Level: 5.7 1.4 N/A Debridement A (sq cm): rea Curette Curette N/A Instrument: Minimum Minimum N/A Bleeding: Pressure Pressure N/A Hemostasis A chieved: 0 0 N/A Procedural Pain: 0 0 N/A Post Procedural Pain: Procedure was tolerated well Procedure was tolerated  well N/A Debridement Treatment Response: 1.9x3x0.2 1.4x1x0.4 N/A Post Debridement Measurements L x W x D (cm) 0.895 0.44 N/A Post Debridement Volume: (cm) Category/Stage III Category/Stage IV N/A Post Debridement Stage: Debridement Debridement N/A Procedures Performed: Treatment Notes Wound #22 (Calcaneus) Wound Laterality: Left Cleanser Soap and Water Discharge Instruction: May shower and wash wound with dial antibacterial soap and water prior to dressing change. Wound Cleanser Discharge Instruction: Cleanse the wound with wound cleanser prior to applying a clean dressing using gauze sponges, not tissue or cotton balls. Peri-Wound Care Zinc Oxide Ointment 30g tube Discharge Instruction: Apply Zinc Oxide to periwound with each dressing change Topical Primary Dressing KerraCel Ag Gelling Fiber Dressing, 4x5 in (silver alginate) Discharge Instruction: Apply silver alginate to wound bed, be sure to pack wound with alginate so in contact with wound bed Secondary Dressing Woven Gauze Sponges 2x2 in Discharge Instruction: moisten with saline and Apply over santyl to fill the wound Zetuvit Plus Silicone Border Dressing 7x7(in/in) Discharge Instruction: Apply silicone border over primary dressing as directed. Secured With 35M Medipore H Soft Cloth Surgical T ape, 4 x 10 (in/yd) Discharge Instruction: Secure with tape as needed Compression Wrap Compression Stockings Add-Ons Wound #23 (Ankle) Wound Laterality: Left, Posterior Cleanser Soap and Water Discharge Instruction: May shower and wash wound with dial antibacterial soap and water prior to dressing change. Wound Cleanser Discharge Instruction: Cleanse the wound with wound cleanser prior to applying a clean dressing using gauze sponges, not tissue or cotton balls. Peri-Wound Care Zinc Oxide Ointment 30g tube Discharge Instruction: Apply Zinc Oxide to periwound with each dressing change Topical Primary Dressing KerraCel Ag  Gelling Fiber Dressing, 4x5 in (silver alginate) Discharge Instruction: Apply silver alginate to wound bed, be sure to pack wound with alginate so in contact with wound bed Secondary Dressing Woven Gauze Sponges 2x2 in Discharge Instruction: moisten with saline and Apply over santyl to fill the wound Zetuvit Plus Silicone Border Dressing 7x7(in/in) Discharge Instruction: Apply silicone border over primary dressing as directed. Secured With 35M Medipore H Soft Cloth Surgical T ape, 4 x 10 (in/yd) Discharge Instruction: Secure with tape as needed Compression Wrap Compression Stockings Add-Ons Electronic Signature(s) Signed: 07/11/2022 1:13:38 PM By: Fredirick Maudlin MD FACS Signed: 07/11/2022 6:13:27 PM By: Baruch Gouty RN, BSN Entered By: Fredirick Maudlin on 07/11/2022 13:13:37 -------------------------------------------------------------------------------- [place] Details Patient Name: Date of Service: [name], [place] Y. 07/11/2022 12:30 PM Medical Record Number: [id number] Patient Account Number: [id number] Date of Birth/Sex: Treating RN: [birth year] (66  y.o. [name] Primary Care Nichelle Renwick: [name] Other Clinician: Referring Darianne Muralles: Treating Kylei Purington/Extender: Casandra Doffing in Treatment: [place] reviewed with physician Active Inactive Pressure Nursing Diagnoses: Knowledge deficit related to causes and risk factors for pressure ulcer development Knowledge deficit related to management of pressures ulcers Potential for impaired tissue integrity related to pressure, friction, moisture, and shear Goals: Patient/caregiver will verbalize understanding of pressure ulcer management Date Initiated: 03/20/2022 Target Resolution Date: 08/14/2022 Goal Status: Active Interventions: Assess: immobility, friction, shearing, incontinence upon admission and as needed Assess offloading mechanisms upon  admission and as needed Assess potential for pressure ulcer upon admission and as needed Notes: Wound/Skin Impairment Nursing Diagnoses: Impaired tissue integrity Knowledge deficit related to smoking impact on wound healing Goals: Patient/caregiver will verbalize understanding of skin care regimen Date Initiated: 01/12/2022 Target Resolution Date: 08/14/2022 Goal Status: Active Interventions: Assess patient/caregiver ability to obtain necessary supplies Assess patient/caregiver ability to perform ulcer/skin care regimen upon admission and as needed Assess ulceration(s) every visit Provide education on smoking Provide education on ulcer and skin care Notes: Electronic Signature(s) Signed: 07/11/2022 4:36:53 PM By: Blanche East RN Signed: 07/11/2022 6:13:27 PM By: Baruch Gouty RN, BSN Entered By: Blanche East on 07/11/2022 13:11:49 -------------------------------------------------------------------------------- Pain Assessment Details Patient Name: Date of Service: [name], [place] Y. 07/11/2022 12:30 PM Medical Record Number: [id number] Patient Account Number: [id number] Date of Birth/Sex: Treating RN: Jan 15, 1956 (66 y.o. [name] Primary Care Freedom Peddy: [name] Other Clinician: Referring Romie Tay: Treating Nijah Tejera/Extender: Casandra Doffing in Treatment: 25 Active Problems Location of Pain Severity and Description of Pain Patient Has Paino No Site Locations Rate the pain. Current Pain Level: 0 Pain Management and Medication Current Pain Management: Electronic Signature(s) Signed: 07/11/2022 4:36:53 PM By: Blanche East RN Signed: 07/11/2022 6:13:27 PM By: Baruch Gouty RN, BSN Entered By: Blanche East on 07/11/2022 12:43:19 -------------------------------------------------------------------------------- Patient/Caregiver Education Details Patient Name: Date of Service: [name], [name] 8/8/2023andnbsp12:30 PM Medical Record  Number: [id number] Patient Account Number: [id number] Date of Birth/Gender: Treating RN: 1956-06-25 (66 y.o. [name] Primary Care Physician: [name] Other Clinician: Referring Physician: Treating Physician/Extender: Casandra Doffing in Treatment: 25 Education Assessment Education Provided To: Patient Education Topics Provided Infection: Methods: Explain/Verbal Responses: Reinforcements needed, State content correctly Pressure: Methods: Explain/Verbal Responses: Reinforcements needed, State content correctly Smoking and Wound Healing: Methods: Explain/Verbal Responses: Reinforcements needed, State content correctly Wound/Skin Impairment: Methods: Explain/Verbal Responses: Reinforcements needed, State content correctly Electronic Signature(s) Signed: 07/11/2022 4:36:53 PM By: Blanche East RN Entered By: Blanche East on 07/11/2022 13:12:08 -------------------------------------------------------------------------------- Wound Assessment Details Patient Name: Date of Service: [name], [place] Y. 07/11/2022 12:30 PM Medical Record Number: [id number] Patient Account Number: [id number] Date of Birth/Sex: Treating RN: 1956/09/30 (66 y.o. [name] Primary Care Djibril Glogowski: [name] Other Clinician: Referring Nilay Mangrum: Treating Maynor Mwangi/Extender: Lourena Simmonds Weeks in Treatment: 25 Wound Status Wound Number: 22 Primary Etiology: Pressure Ulcer Wound Location: Left Calcaneus Wound Status: Open Wounding Event: Pressure Injury Comorbid History: Cataracts, Hypotension, Neuropathy, Quadriplegia Date Acquired: 11/03/2021 Weeks Of Treatment: 25 Clustered Wound: No Photos Wound Measurements Length: (cm) 1.9 Width: (cm) 3 Depth: (cm) 0.2 Area: (cm) 4.477 Volume: (cm) 0.895 % Reduction in Area: 28.7% % Reduction in Volume: 52.5% Epithelialization: Small (1-33%) Tunneling: No Undermining: No Wound  Description Classification: Category/Stage III Wound Margin: Distinct, outline attached Exudate Amount: Medium Exudate Type: Serosanguineous Exudate Color: red, brown Foul Odor After Cleansing: No Slough/Fibrino Yes  Wound Bed Granulation Amount: Medium (34-66%) Exposed Structure Granulation Quality: Red Fascia Exposed: No Necrotic Amount: Medium (34-66%) Fat Layer (Subcutaneous Tissue) Exposed: Yes Necrotic Quality: Adherent Slough Tendon Exposed: No Muscle Exposed: No Joint Exposed: No Bone Exposed: No Treatment Notes Wound #22 (Calcaneus) Wound Laterality: Left Cleanser Soap and Water Discharge Instruction: May shower and wash wound with dial antibacterial soap and water prior to dressing change. Wound Cleanser Discharge Instruction: Cleanse the wound with wound cleanser prior to applying a clean dressing using gauze sponges, not tissue or cotton balls. Peri-Wound Care Zinc Oxide Ointment 30g tube Discharge Instruction: Apply Zinc Oxide to periwound with each dressing change Topical Primary Dressing KerraCel Ag Gelling Fiber Dressing, 4x5 in (silver alginate) Discharge Instruction: Apply silver alginate to wound bed, be sure to pack wound with alginate so in contact with wound bed Secondary Dressing Woven Gauze Sponges 2x2 in Discharge Instruction: moisten with saline and Apply over santyl to fill the wound Zetuvit Plus Silicone Border Dressing 7x7(in/in) Discharge Instruction: Apply silicone border over primary dressing as directed. Secured With 56M Medipore H Soft Cloth Surgical T ape, 4 x 10 (in/yd) Discharge Instruction: Secure with tape as needed Compression Wrap Compression Stockings Add-Ons Electronic Signature(s) Signed: 07/11/2022 4:36:53 PM By: Blanche East RN Signed: 07/11/2022 6:13:27 PM By: Baruch Gouty RN, BSN Entered By: Blanche East on 07/11/2022 12:56:24 -------------------------------------------------------------------------------- Wound  Assessment Details Patient Name: Date of Service: [name], [place]. 07/11/2022 12:30 PM Medical Record Number: [id number] Patient Account Number: [id number] Date of Birth/Sex: Treating RN: [birth year] (66 y.o. [name] Primary Care Gerilynn Mccullars: [name] Other Clinician: Referring Blossom Crume: Treating Jamilyn Pigeon/Extender: Lourena Simmonds Weeks in Treatment: 25 Wound Status Wound Number: 23 Primary Etiology: Pressure Ulcer Wound Location: Left, Posterior Ankle Wound Status: Open Wounding Event: Pressure Injury Comorbid History: Cataracts, Hypotension, Neuropathy, Quadriplegia Date Acquired: 11/03/2021 Weeks Of Treatment: 25 Clustered Wound: No Photos Wound Measurements Length: (cm) 1.4 Width: (cm) 1 Depth: (cm) 0.4 Area: (cm) 1.1 Volume: (cm) 0.44 % Reduction in Area: 74.1% % Reduction in Volume: 74.1% Epithelialization: Small (1-33%) Tunneling: No Undermining: No Wound Description Classification: Category/Stage IV Wound Margin: Flat and Intact Exudate Amount: Medium Exudate Type: Serosanguineous Exudate Color: red, brown Foul Odor After Cleansing: No Slough/Fibrino Yes Wound Bed Granulation Amount: Medium (34-66%) Exposed Structure Granulation Quality: Red Fascia Exposed: No Necrotic Amount: Medium (34-66%) Fat Layer (Subcutaneous Tissue) Exposed: Yes Necrotic Quality: Adherent Slough Tendon Exposed: No Muscle Exposed: No Joint Exposed: No Bone Exposed: No Treatment Notes Wound #23 (Ankle) Wound Laterality: Left, Posterior Cleanser Soap and Water Discharge Instruction: May shower and wash wound with dial antibacterial soap and water prior to dressing change. Wound Cleanser Discharge Instruction: Cleanse the wound with wound cleanser prior to applying a clean dressing using gauze sponges, not tissue or cotton balls. Peri-Wound Care Zinc Oxide Ointment 30g tube Discharge Instruction: Apply Zinc Oxide to periwound with each dressing  change Topical Primary Dressing KerraCel Ag Gelling Fiber Dressing, 4x5 in (silver alginate) Discharge Instruction: Apply silver alginate to wound bed, be sure to pack wound with alginate so in contact with wound bed Secondary Dressing Woven Gauze Sponges 2x2 in Discharge Instruction: moisten with saline and Apply over santyl to fill the wound Zetuvit Plus Silicone Border Dressing 7x7(in/in) Discharge Instruction: Apply silicone border over primary dressing as directed. Secured With 56M Medipore H Soft Cloth Surgical T ape, 4 x 10 (in/yd) Discharge Instruction: Secure with tape as needed Compression Wrap Compression Stockings Add-Ons Electronic Signature(s) Signed: 07/11/2022 4:36:53  PM By: Blanche East RN Signed: 07/11/2022 6:13:27 PM By: Baruch Gouty RN, BSN Entered By: Blanche East on 07/11/2022 12:56:46 -------------------------------------------------------------------------------- [place] Details Patient Name: Date of Service: [name], [place] Y. 07/11/2022 12:30 PM Medical Record Number: [id number] Patient Account Number: [id number] Date of Birth/Sex: Treating RN: 07-13-1956 (66 y.o. [name] Primary Care Analyce Tavares: [name] Other Clinician: Referring Yechiel Erny: Treating Elnathan Fulford/Extender: Casandra Doffing in Treatment: 25 Vital Signs Time Taken: 12:39 Temperature (F): 97.9 Height (in): 64 Pulse (bpm): 55 Weight (lbs): 175 Respiratory Rate (breaths/min): 18 Body Mass Index (BMI): 30 Blood Pressure (mmHg): 87/47 Reference Range: 80 - 120 mg / dl Electronic Signature(s) Signed: 07/11/2022 4:36:53 PM By: Blanche East RN Entered By: Blanche East on 07/11/2022 12:43:10

## 2022-07-11 NOTE — Progress Notes (Signed)
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 the tip of the left heel and just above the Achilles area. Generally better looking surfaces within using her Keystone antibiotic and Hydrofera Blue. She has home health changing the dressing ABI 1.16 02/08/2022: There is a wound on her calcaneus and one just above the Achilles. She has been using Keystone antibiotic and Hydrofera Blue. Home health has been changing the dressing. She reports that 1 nurse has been cutting the Hydrofera Blue to the size of the wound and saturating the wound with [facility] antibiotic prior to applying it; the other nurse has been using an entire square of Hydrofera Blue and using a very minimal amount of the antibiotic. There appears to be some disconnect in the instructions. The wounds are may be a little bit smaller. There remains necrotic tissue in the calcaneal wound, along with adherent slough. The Achilles wound is better with just some slough present. 02/20/2022: I took another culture at her last  visit. This showed only some yeast, no bacteria. She has still been using the [facility] antibiotic and Hydrofera Blue. She is concerned about the cost of her biweekly visits and the debridements that occur. She is wondering if we can go back to [facility] and stretch her visits out to once a month. 03/20/2022: At her last visit, per her request, we changed her dressing back to [facility] under Hydrofera Blue. She reports that her home health nurses continue to not cut the [facility] Blue to fit the wound and instead just place it over the wound. The Santyl is being applied to the [facility] and so the patient does not think it is actually contacting her wounds. 04/17/2022: Today, both wounds are bigger and there was a strong odor on her dressings. There is also some greenish discharge present. We have been using Santyl under Hydrofera Blue. The patient has numerous complaints about her home health care providers today, but this is fairly typical. 05/15/2022: Last visit, I was concerned for infection and took a culture. Based upon these results, we ordered a new Keystone topical antibiotic. We have been using that with silver alginate on her wounds. Both wounds are smaller today. The intake nurse was concerned about some black-looking discoloration on her heel. On further inspection, it actually ended up being old hematoma. 06/12/2022: Both wounds measure a little bit smaller today. They are both fairly friable and bleed easily with manipulation. The heel wound has some additional discolored and boggy tissue on the more distal aspect along with some accumulated slough. The posterior ankle wound now has Achilles tendon exposed, which I do not think has been documented in the past. We are using [facility]  topical antibiotic and silver alginate. 07/11/2022: Both wounds are little smaller again today. The boggy tissue on the heel was debrided at our last visit. She still has some slough buildup on both wound surfaces.  The Achilles tendon remains exposed at the ankle wound. Her [employer] compound was unfortunately compromised with moisture and is not usable any longer. She has had additional disagreements with her home health care providers and may not be able to receive their services going forward. Patient History Family History Unknown History. 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 Suprapubic catheter Musculoskeletal Osteoporosis, decubitis ulcer of sacral region (stage 1); Objective Constitutional Within normal range for this patient. Slightly bradycardic, asymptomatic.Marland Kitchen No acute distress.. Vitals Time Taken: 12:39 PM, Height: 64 in, Weight: 175 lbs, BMI: 30, Temperature: 97.9 F, Pulse: 55 bpm, Respiratory Rate: 18 breaths/min, Blood Pressure: 87/47 mmHg. Respiratory Normal work of breathing on room air.. General Notes: 07/11/2022: Both wounds are little smaller again today. The boggy tissue on the heel was debrided at our last visit. She still has some slough buildup on both wound surfaces. The Achilles tendon remains exposed at the ankle wound. Integumentary (Hair, Skin) Wound #22 status is Open. Original cause of wound was Pressure Injury. The date acquired was: 11/03/2021. The wound has been in treatment 25 weeks. The wound is located on the Left Calcaneus. The wound measures 1.9cm length x 3cm width x 0.2cm depth; 4.477cm^2 area and 0.895cm^3 volume. There is Fat Layer (Subcutaneous Tissue) exposed. There is no tunneling or undermining noted. There is a medium amount of  serosanguineous drainage noted. The wound margin is distinct with the outline attached to the wound base. There is medium (34-66%) red granulation within the wound bed. There is a medium (34-66%) amount of necrotic tissue within the wound bed including Adherent Slough. Wound #23 status is Open. Original cause of wound was Pressure Injury. The date acquired was: 11/03/2021. The wound has been in treatment 25 weeks. The wound is located on the Left,Posterior Ankle. The wound measures 1.4cm length x 1cm width x 0.4cm depth; 1.1cm^2 area and 0.44cm^3 volume. There is Fat Layer (Subcutaneous Tissue) exposed. There is no tunneling or undermining noted. There is a medium amount of serosanguineous drainage noted. The wound margin is flat and intact. There is medium (34-66%) red granulation within the wound bed. There is a medium (34-66%) amount of necrotic tissue within the wound bed including Adherent Slough. 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 There was a Excisional Skin/Subcutaneous Tissue Debridement with a total area of 5.7 sq cm performed by Angela Maudlin, MD. With the following instrument(s): Curette to remove Viable and Non-Viable tissue/material. Material removed includes Subcutaneous Tissue and Slough and. No specimens were taken. A time out was conducted at 13:05, prior to the start of the procedure. A Minimum amount of bleeding was controlled with Pressure. The procedure was tolerated well with a pain level  of 0 throughout and a pain level of 0 following the procedure. Post Debridement Measurements: 1.9cm length x 3cm width x 0.2cm depth; 0.895cm^3 volume. Post debridement Stage noted  as Category/Stage III. Character of Wound/Ulcer Post Debridement is improved. Post procedure Diagnosis Wound #22: Same as Pre-Procedure Wound #23 Pre-procedure diagnosis of Wound #23 is a Pressure Ulcer located on the Left,Posterior Ankle . There was a Excisional Skin/Subcutaneous Tissue Debridement with a total area of 1.4 sq cm performed by Angela Maudlin, MD. With the following instrument(s): Curette to remove Viable and Non-Viable tissue/material. Material removed includes Subcutaneous Tissue and Slough and. No specimens were taken. A time out was conducted at 13:05, prior to the start of the procedure. A Minimum amount of bleeding was controlled with Pressure. The procedure was tolerated well with a pain level of 0 throughout and a pain level of 0 following the procedure. Post Debridement Measurements: 1.4cm length x 1cm width x 0.4cm depth; 0.44cm^3 volume. Post debridement Stage noted as Category/Stage IV. Character of Wound/Ulcer Post Debridement is improved. Post procedure Diagnosis Wound #23: Same as Pre-Procedure Plan Follow-up Appointments: Return appointment in 1 month. - Dr. Celine Ahr with Mayra Reel 1 Sept. 9/11 @ 12:30 Bathing/ Shower/ Hygiene: May shower with protection but do not get wound dressing(s) wet. Off-Loading: Heel suspension boot to: - Wear the Prevalon boots to both feet at all times Home Health: No change in wound care orders this week; continue Home Health for wound care. May utilize formulary equivalent dressing for wound treatment orders unless otherwise specified. Other Home Health Orders/Instructions: Jackquline [place] WOUND #22: - Calcaneus Wound Laterality: Left Cleanser: Soap and Water (Home Health) 3 x Per Week/30 Days Discharge Instructions: May shower and wash wound with dial antibacterial soap and water prior to dressing change. Cleanser: Wound Cleanser (Home Health) 3 x Per Week/30 Days Discharge Instructions: Cleanse the wound with wound cleanser prior to  applying a clean dressing using gauze sponges, not tissue or cotton balls. Peri-Wound Care: Zinc Oxide Ointment 30g tube 3 x Per Week/30 Days Discharge Instructions: Apply Zinc Oxide to periwound with each dressing change Prim Dressing: KerraCel Ag Gelling Fiber Dressing, 4x5 in (silver alginate) 3 x Per Week/30 Days ary Discharge Instructions: Apply silver alginate to wound bed, be sure to pack wound with alginate so in contact with wound bed Secondary Dressing: Woven Gauze Sponges 2x2 in 3 x Per Week/30 Days Discharge Instructions: moisten with saline and Apply over santyl to fill the wound Secondary Dressing: Zetuvit Plus Silicone Border Dressing 7x7(in/in) (Home Health) 3 x Per Week/30 Days Discharge Instructions: Apply silicone border over primary dressing as directed. Secured With: 52M Medipore H Soft Cloth Surgical T ape, 4 x 10 (in/yd) (Home Health) 3 x Per Week/30 Days Discharge Instructions: Secure with tape as needed WOUND #23: - Ankle Wound Laterality: Left, Posterior Cleanser: Soap and Water ([place]) 3 x Per Week/30 Days Discharge Instructions: May shower and wash wound with dial antibacterial soap and water prior to dressing change. Cleanser: Wound Cleanser (Home Health) 3 x Per Week/30 Days Discharge Instructions: Cleanse the wound with wound cleanser prior to applying a clean dressing using gauze sponges, not tissue or cotton balls. Peri-Wound Care: Zinc Oxide Ointment 30g tube 3 x Per Week/30 Days Discharge Instructions: Apply Zinc Oxide to periwound with each dressing change Prim Dressing: KerraCel Ag Gelling Fiber Dressing, 4x5 in (silver alginate) 3 x Per Week/30 Days ary Discharge Instructions: Apply silver alginate to wound bed, be sure to pack wound with alginate  so in contact with wound bed Secondary Dressing: Woven Gauze Sponges 2x2 in 3 x Per Week/30 Days Discharge Instructions: moisten with saline and Apply over santyl to fill the wound Secondary Dressing:  Zetuvit Plus Silicone Border Dressing 7x7(in/in) ([place]) 3 x Per Week/30 Days Discharge Instructions: Apply silicone border over primary dressing as directed. Secured With: 37M Medipore H Soft Cloth Surgical T ape, 4 x 10 (in/yd) (Home Health) 3 x Per Week/30 Days Discharge Instructions: Secure with tape as needed 07/11/2022: Both wounds are little smaller again today. The boggy tissue on the heel was debrided at our last visit. She still has some slough buildup on both wound surfaces. The Achilles tendon remains exposed at the ankle wound. I used a curette to debride slough and nonviable subcutaneous tissue from both of her wounds. At this point, I do not think she requires a topical antibiotic agent, but we will continue to monitor closely. Continue silver alginate. Follow-up in 4 weeks per patient request. Electronic Signature(s) Signed: 07/11/2022 1:16:51 PM By: Angela Maudlin MD FACS Entered By: [name] on 07/11/2022 13:16:51 -------------------------------------------------------------------------------- HxROS Details Patient Name: Date of Service: [name], [place] Y. 07/11/2022 12:30 PM Medical Record Number: [id number] Patient Account Number: [id number] Date of Birth/Sex: Treating RN: March 29, 1956 (66 y.o. 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 Suprapubic  catheter Musculoskeletal Medical History: Past Medical History Notes: Osteoporosis, decubitis ulcer of sacral region (stage 1); Neurologic Medical History: Positive for: Neuropathy - arm; Quadriplegia Psychiatric Medical History: Negative for: Confinement Anxiety HBO Extended History Items Eyes: Cataracts Immunizations Pneumococcal Vaccine: Received Pneumococcal Vaccination: Yes Received Pneumococcal Vaccination On or After 60th Birthday: No Tetanus Vaccine: Last tetanus shot: 12/04/2010 Implantable Devices No devices added Family and Social History Unknown History: Yes Engineer, maintenance) Signed: 07/11/2022 3:45:45 PM By: Angela Maudlin MD FACS Signed: 07/11/2022 6:13:27 PM By: Angela Gouty RN, BSN Entered By: [name] on 07/11/2022 13:15:24 -------------------------------------------------------------------------------- SuperBill Details Patient Name: Date of Service: [name], [place] Y. 07/11/2022 Medical Record Number: [id number] Patient Account Number: [id number] Date of Birth/Sex: Treating RN: 1956-10-30 (66 y.o. [name] Primary Care Provider: Lorrene Walker Other Clinician: Referring Provider: Treating Provider/Extender: Lourena Simmonds Weeks in Treatment: 25 Diagnosis Coding ICD-10 Codes Code Description [phone] Pressure ulcer of left heel, stage 3 L97.828 Non-pressure chronic ulcer of other part of left lower leg with other specified severity S14.109S Unspecified injury at unspecified level of cervical spinal cord, sequela Facility Procedures Physician Procedures : CPT4 Code Description Modifier [id number] - WC PHYS LEVEL 3 - EST PT 25 ICD-10 Diagnosis Description L89.623 Pressure ulcer of left heel, stage 3 L97.828 Non-pressure chronic ulcer of other part of left lower leg with other specified severity  S14.109S Unspecified injury at unspecified level of cervical spinal cord, sequela Quantity: 1 : [id number] -  WC PHYS SUBQ TISS 20 SQ CM ICD-10 Diagnosis Description L89.623 Pressure ulcer of left heel, stage 3 L97.828 Non-pressure chronic ulcer of other part of left lower leg with other specified severity Quantity: 1 Electronic Signature(s) Signed:  07/11/2022 1:17:11 PM By: Angela Maudlin MD FACS Entered By: [name] on 07/11/2022 13:17:11

## 2022-07-18 ENCOUNTER — Telehealth: Payer: Self-pay | Admitting: Physician Assistant

## 2022-07-18 DIAGNOSIS — B379 Candidiasis, unspecified: Secondary | ICD-10-CM

## 2022-07-18 MED ORDER — FLUCONAZOLE 150 MG PO TABS: 150.0000 mg | ORAL_TABLET | Freq: Once | ORAL | 0 refills | Status: AC

## 2022-07-18 NOTE — Telephone Encounter (Signed)
Patient states she has a really bad yeast infection and is asking for diflucan. Please advise.

## 2022-07-18 NOTE — Telephone Encounter (Signed)
Per Herb Grays this medication has been sent to the pharmacy. AS, CMA

## 2022-07-21 NOTE — Telephone Encounter (Signed)
Medication was sent to [facility] on 07/18/2022. Medication name is Diflucan.

## 2022-07-21 NOTE — Telephone Encounter (Signed)
Pharmacy does not have this prescription and I cannot see where it was sent in. Please advise

## 2022-07-21 NOTE — Telephone Encounter (Signed)
Patient aware.

## 2022-07-24 ENCOUNTER — Other Ambulatory Visit: Payer: Self-pay | Admitting: Physician Assistant

## 2022-07-24 DIAGNOSIS — G8250 Quadriplegia, unspecified: Secondary | ICD-10-CM

## 2022-07-31 ENCOUNTER — Encounter: Payer: Self-pay | Admitting: Physician Assistant

## 2022-07-31 LAB — PROTIME-INR

## 2022-08-06 ENCOUNTER — Other Ambulatory Visit: Payer: Self-pay | Admitting: Physician Assistant

## 2022-08-14 ENCOUNTER — Encounter (HOSPITAL_BASED_OUTPATIENT_CLINIC_OR_DEPARTMENT_OTHER): Payer: Medicare Other | Attending: General Surgery | Admitting: General Surgery

## 2022-08-14 DIAGNOSIS — E039 Hypothyroidism, unspecified: Secondary | ICD-10-CM | POA: Insufficient documentation

## 2022-08-14 DIAGNOSIS — G8250 Quadriplegia, unspecified: Secondary | ICD-10-CM | POA: Diagnosis not present

## 2022-08-14 DIAGNOSIS — Z981 Arthrodesis status: Secondary | ICD-10-CM | POA: Diagnosis not present

## 2022-08-14 DIAGNOSIS — L97828 Non-pressure chronic ulcer of other part of left lower leg with other specified severity: Secondary | ICD-10-CM | POA: Insufficient documentation

## 2022-08-14 DIAGNOSIS — L89623 Pressure ulcer of left heel, stage 3: Secondary | ICD-10-CM | POA: Diagnosis not present

## 2022-08-14 DIAGNOSIS — Z7901 Long term (current) use of anticoagulants: Secondary | ICD-10-CM | POA: Insufficient documentation

## 2022-08-14 NOTE — Progress Notes (Signed)
TRULY, [name] ([id number]) Visit Report for 08/14/2022 Arrival Information Details Patient Name: Date of Service: [place], [name] 08/14/2022 12:30 PM Medical Record Number: [id number] Patient Account Number: [id number] Date of Birth/Sex: Treating RN: 1956/02/02 (66 y.o. [name] Primary Care Analei Whinery: [name] Other Clinician: Referring Brandalyn Harting: Treating Shakari Qazi/Extender: Casandra Doffing in Treatment: 4 Visit Information History Since Last Visit Added or deleted any medications: No Patient Arrived: Wheel Chair Any new allergies or adverse reactions: No Arrival Time: 12:41 Had a fall or experienced change in No Accompanied By: daughter activities of daily living that may affect Transfer Assistance: None risk of falls: Patient Requires Transmission-Based Precautions: No Signs or symptoms of abuse/neglect since last visito No Patient Has Alerts: Yes Hospitalized since last visit: No Patient Alerts: Patient on Blood Thinner Implantable device outside of the clinic excluding No Coumadin cellular tissue based products placed in the center since last visit: Has Dressing in Place as Prescribed: Yes Has Footwear/Offloading in Place as Prescribed: Yes Left: Multipodus Split/Boot Right: Multipodus Split/Boot Pain Present Now: No Notes stays in wheelchair Electronic Signature(s) Signed: 08/14/2022 4:51:35 PM By: Baruch Gouty RN, BSN Entered By: Baruch Gouty on 08/14/2022 12:42:29 -------------------------------------------------------------------------------- Encounter Discharge Information Details Patient Name: Date of Service: [name], [place] Y. 08/14/2022 12:30 PM Medical Record Number: [id number] Patient Account Number: [id number] Date of Birth/Sex: Treating RN: Apr 16, 1956 (66 y.o. [name] Primary Care Katrell Milhorn: [name] Other Clinician: Referring Tyshan Enderle: Treating Keilan Nichol/Extender: Casandra Doffing in Treatment: 30 Encounter Discharge Information Items Post Procedure Vitals Discharge Condition: Stable Temperature (F): 97.7 Ambulatory Status: Wheelchair Pulse (bpm): 60 Discharge Destination: Home Respiratory Rate (breaths/min): 18 Transportation: Private Auto Blood Pressure (mmHg): 106/70 Accompanied By: daughter Schedule Follow-up Appointment: Yes Clinical Summary of Care: Patient Declined Electronic Signature(s) Signed: 08/14/2022 4:51:35 PM By: Baruch Gouty RN, BSN Entered By: Baruch Gouty on 08/14/2022 13:19:54 -------------------------------------------------------------------------------- Lower Extremity Assessment Details Patient Name: Date of Service: [name], [place] Y. 08/14/2022 12:30 PM Medical Record Number: [id number] Patient Account Number: [id number] Date of Birth/Sex: Treating RN: [birth year] (65 y.o. [name] Primary Care Dontee Jaso: [name] Other Clinician: Referring Elyjah Hazan: Treating Shandi Godfrey/Extender: Lourena Simmonds Weeks in Treatment: 30 Edema Assessment Assessed: [Left: No] [Right: No] Edema: [Left: Ye] [Right: s] Calf Left: Right: Point of Measurement: 31 cm From Medial Instep 28 cm Ankle Left: Right: Point of Measurement: 7 cm From Medial Instep 20.7 cm Vascular Assessment Pulses: Dorsalis Pedis Palpable: [Left:Yes] Electronic Signature(s) Signed: 08/14/2022 4:51:35 PM By: Baruch Gouty RN, BSN Entered By: Baruch Gouty on 08/14/2022 12:47:29 -------------------------------------------------------------------------------- Multi Wound Chart Details Patient Name: Date of Service: [name], [place] Y. 08/14/2022 12:30 PM Medical Record Number: [id number] Patient Account Number: [id number] Date of Birth/Sex: Treating RN: 01/13/1956 (66 y.o. [name] Primary Care Faiga Stones: [name] Other Clinician: Referring Cameran Ahmed: Treating Iyahna Obriant/Extender:  Casandra Doffing in Treatment: 30 Vital Signs Height(in): 64 Pulse(bpm): 60 Weight(lbs): 175 Blood Pressure(mmHg): 106/70 Body Mass Index(BMI): 30 Temperature(F): 97.7 Respiratory Rate(breaths/min): 18 Photos: [22:Left Calcaneus] [23:Left, Posterior Ankle] [N/A:N/A N/A] Wound Location: [22:Pressure Injury] [23:Pressure Injury] [N/A:N/A] Wounding Event: [22:Pressure Ulcer] [23:Pressure Ulcer] [N/A:N/A] Primary Etiology: [22:Cataracts, Hypotension, Neuropathy, Cataracts, Hypotension, Neuropathy, N/A] Comorbid History: [22:Quadriplegia 11/03/2021] [23:Quadriplegia 11/03/2021] [N/A:N/A] Date Acquired: [22:30] [23:30] [N/A:N/A] Weeks of Treatment: [22:Open] [23:Open] [N/A:N/A] Wound Status: [22:No] [23:No] [N/A:N/A] Wound Recurrence: [22:1.9x3x0.1] [23:1.8x1.3x0.3] [N/A:N/A] Measurements L x W x D (cm) [22:4.477] [23:1.838] [N/A:N/A] A (cm) : rea [22:0.448] [23:0.551] [N/A:N/A]  Volume (cm) : [22:28.70%] [23:56.70%] [N/A:N/A] % Reduction in A [22:rea: 76.20%] [23:67.50%] [N/A:N/A] % Reduction in Volume: [22:Category/Stage III] [23:Category/Stage IV] [N/A:N/A] Classification: [22:Medium] [23:Medium] [N/A:N/A] Exudate A mount: [22:Serosanguineous] [23:Serosanguineous] [N/A:N/A] Exudate Type: [22:red, brown] [23:red, brown] [N/A:N/A] Exudate Color: [22:Distinct, outline attached] [23:Flat and Intact] [N/A:N/A] Wound Margin: [22:Medium (34-66%)] [23:Large (67-100%)] [N/A:N/A] Granulation A mount: [22:Red] [23:Red] [N/A:N/A] Granulation Quality: [22:Medium (34-66%)] [23:Small (1-33%)] [N/A:N/A] Necrotic A mount: [22:Fat Layer (Subcutaneous Tissue): Yes Fat Layer (Subcutaneous Tissue): Yes N/A] Exposed Structures: [22:Fascia: No Tendon: No Muscle: No Joint: No Bone: No Small (1-33%)] [23:Tendon: Yes Fascia: No Muscle: No Joint: No Bone: No Small (1-33%)] [N/A:N/A] Epithelialization: [22:Debridement - Selective/Open Wound Debridement - Selective/Open Wound  N/A] Debridement: Pre-procedure Verification/Time Out 13:00 [23:13:00] [N/A:N/A] Taken: [22:Non-Viable Tissue] [23:Non-Viable Tissue] [N/A:N/A] Level: [22:5.7] [23:2.34] [N/A:N/A] Debridement A (sq cm): [22:rea Curette] [23:Curette] [N/A:N/A] Instrument: [22:Minimum] [23:Minimum] [N/A:N/A] Bleeding: [22:Pressure] [23:Pressure] [N/A:N/A] Hemostasis Achieved: [22:Insensate] [23:Insensate] [N/A:N/A] Procedural Pain: [22:Insensate] [23:Insensate] [N/A:N/A] Post Procedural Pain: [22:Procedure was tolerated well] [23:Procedure was tolerated well] [N/A:N/A] Debridement Treatment Response: [22:1.9x3x0.1] [23:1.8x1.3x0.3] [N/A:N/A] Post Debridement Measurements L x W x D (cm) [22:0.448] [42:6.834] [N/A:N/A] Post Debridement Volume: (cm) [22:Category/Stage III] [23:Category/Stage IV] [N/A:N/A] Post Debridement Stage: [22:Debridement] [23:Debridement] [N/A:N/A] Treatment Notes Electronic Signature(s) Signed: 08/14/2022 1:18:07 PM By: Fredirick Maudlin MD FACS Signed: 08/14/2022 4:51:35 PM By: Baruch Gouty RN, BSN Entered By: Fredirick Maudlin on 08/14/2022 13:18:07 -------------------------------------------------------------------------------- Multi-Disciplinary Care Plan Details Patient Name: Date of Service: [name], [place] Y. 08/14/2022 12:30 PM Medical Record Number: [id number] Patient Account Number: [id number] Date of Birth/Sex: Treating RN: 10-24-1956 (66 y.o. [name] Primary Care Latori Beggs: [name] Other Clinician: Referring Abran Gavigan: Treating Abrahm Mancia/Extender: Casandra Doffing in Treatment: [place] reviewed with physician Active Inactive Pressure Nursing Diagnoses: Knowledge deficit related to causes and risk factors for pressure ulcer development Knowledge deficit related to management of pressures ulcers Potential for impaired tissue integrity related to pressure, friction, moisture, and  shear Goals: Patient/caregiver will verbalize understanding of pressure ulcer management Date Initiated: 03/20/2022 Target Resolution Date: 09/11/2022 Goal Status: Active Interventions: Assess: immobility, friction, shearing, incontinence upon admission and as needed Assess offloading mechanisms upon admission and as needed Assess potential for pressure ulcer upon admission and as needed Notes: Wound/Skin Impairment Nursing Diagnoses: Impaired tissue integrity Knowledge deficit related to smoking impact on wound healing Goals: Patient/caregiver will verbalize understanding of skin care regimen Date Initiated: 01/12/2022 Target Resolution Date: 09/11/2022 Goal Status: Active Interventions: Assess patient/caregiver ability to obtain necessary supplies Assess patient/caregiver ability to perform ulcer/skin care regimen upon admission and as needed Assess ulceration(s) every visit Provide education on smoking Provide education on ulcer and skin care Notes: Electronic Signature(s) Signed: 08/14/2022 4:51:35 PM By: Baruch Gouty RN, BSN Entered By: Baruch Gouty on 08/14/2022 12:52:07 -------------------------------------------------------------------------------- Pain Assessment Details Patient Name: Date of Service: [name], [place] Y. 08/14/2022 12:30 PM Medical Record Number: [id number] Patient Account Number: [id number] Date of Birth/Sex: Treating RN: 30-May-1956 (66 y.o. [name] Primary Care Nykeria Mealing: [name] Other Clinician: Referring Antoni Stefan: Treating Ameirah Khatoon/Extender: Casandra Doffing in Treatment: 30 Active Problems Location of Pain Severity and Description of Pain Patient Has Paino No Site Locations Rate the pain. Rate the pain. Current Pain Level: 0 Pain Management and Medication Current Pain Management: Electronic Signature(s) Signed: 08/14/2022 4:51:35 PM By: Baruch Gouty RN, BSN Entered By: Baruch Gouty on  08/14/2022 12:43:07 -------------------------------------------------------------------------------- Patient/Caregiver Education Details Patient Name: Date of Service: Angela Balzarine, [name] 9/11/2023andnbsp12:30 PM  Medical Record Number: [id number] Patient Account Number: [id number] Date of Birth/Gender: Treating RN: March 17, 1956 (66 y.o. [name] Primary Care Physician: [name] Other Clinician: Referring Physician: Treating Physician/Extender: Casandra Doffing in Treatment: 30 Education Assessment Education Provided To: Patient Education Topics Provided Pressure: Methods: Explain/Verbal Responses: Reinforcements needed, State content correctly Wound/Skin Impairment: Methods: Explain/Verbal Responses: Reinforcements needed, State content correctly Electronic Signature(s) Signed: 08/14/2022 4:51:35 PM By: Baruch Gouty RN, BSN Entered By: Baruch Gouty on 08/14/2022 12:52:27 -------------------------------------------------------------------------------- Wound Assessment Details Patient Name: Date of Service: [name], [place] Y. 08/14/2022 12:30 PM Medical Record Number: [id number] Patient Account Number: [id number] Date of Birth/Sex: Treating RN: [birth year] (66 y.o. [name] Primary Care Opie Fanton: [name] Other Clinician: Referring Avantae Bither: Treating Matrice Herro/Extender: Lourena Simmonds Weeks in Treatment: 30 Wound Status Wound Number: [id number] Primary Etiology: Pressure Ulcer Wound Location: Left Calcaneus Wound Status: Open Wounding Event: Pressure Injury Comorbid History: Cataracts, Hypotension, Neuropathy, Quadriplegia Date Acquired: 11/03/2021 Weeks Of Treatment: 30 Clustered Wound: No Photos Wound Measurements Length: (cm) 1.9 Width: (cm) 3 Depth: (cm) 0.1 Area: (cm) 4.477 Volume: (cm) 0.448 % Reduction in Area: 28.7% % Reduction in Volume: 76.2% Epithelialization: Small  (1-33%) Tunneling: No Undermining: No Wound Description Classification: Category/Stage III Wound Margin: Distinct, outline attached Exudate Amount: Medium Exudate Type: Serosanguineous Exudate Color: red, brown Foul Odor After Cleansing: No Slough/Fibrino Yes Wound Bed Granulation Amount: Medium (34-66%) Exposed Structure Granulation Quality: Red Fascia Exposed: No Necrotic Amount: Medium (34-66%) Fat Layer (Subcutaneous Tissue) Exposed: Yes Necrotic Quality: Adherent Slough Tendon Exposed: No Muscle Exposed: No Joint Exposed: No Bone Exposed: No Treatment Notes Wound #22 (Calcaneus) Wound Laterality: Left Cleanser Soap and Water Discharge Instruction: May shower and wash wound with dial antibacterial soap and water prior to dressing change. Wound Cleanser Discharge Instruction: Cleanse the wound with wound cleanser prior to applying a clean dressing using gauze sponges, not tissue or cotton balls. Peri-Wound Care Triamcinolone 15 (g) Discharge Instruction: Use triamcinolone 15 (g) to periwound redness with dressing changes Zinc Oxide Ointment 30g tube Discharge Instruction: Apply Zinc Oxide to periwound with each dressing change as needed Topical Primary Dressing Promogran Prisma Matrix, 4.34 (sq in) (silver collagen) Discharge Instruction: Moisten collagen with saline or hydrogel Secondary Dressing Woven Gauze Sponges 2x2 in Discharge Instruction: moisten with saline and Apply over santyl to fill the wound Zetuvit Plus Silicone Border Dressing 7x7(in/in) Discharge Instruction: Apply silicone border over primary dressing as directed. Secured With 17M Medipore H Soft Cloth Surgical T ape, 4 x 10 (in/yd) Discharge Instruction: Secure with tape as needed Compression Wrap Compression Stockings Add-Ons Electronic Signature(s) Signed: 08/14/2022 4:51:35 PM By: Baruch Gouty RN, BSN Entered By: Baruch Gouty on 08/14/2022  12:54:12 -------------------------------------------------------------------------------- Wound Assessment Details Patient Name: Date of Service: [name], [place] Y. 08/14/2022 12:30 PM Medical Record Number: [id number] Patient Account Number: [id number] Date of Birth/Sex: Treating RN: Jul 08, 1956 (66 y.o. [name] Primary Care Yousra Ivens: [name] Other Clinician: Referring Markayla Reichart: Treating Bayron Dalto/Extender: Lourena Simmonds Weeks in Treatment: 30 Wound Status Wound Number: 23 Primary Etiology: Pressure Ulcer Wound Location: Left, Posterior Ankle Wound Status: Open Wounding Event: Pressure Injury Comorbid History: Cataracts, Hypotension, Neuropathy, Quadriplegia Date Acquired: 11/03/2021 Weeks Of Treatment: 30 Clustered Wound: No Photos Wound Measurements Length: (cm) 1.8 Width: (cm) 1.3 Depth: (cm) 0.3 Area: (cm) 1.838 Volume: (cm) 0.551 % Reduction in Area: 56.7% % Reduction in Volume: 67.5% Epithelialization: Small (1-33%) Tunneling: No Undermining: No Wound Description Classification: Category/Stage IV Wound Margin:  Flat and Intact Exudate Amount: Medium Exudate Type: Serosanguineous Exudate Color: red, brown Foul Odor After Cleansing: No Slough/Fibrino Yes Wound Bed Granulation Amount: Large (67-100%) Exposed Structure Granulation Quality: Red Fascia Exposed: No Necrotic Amount: Small (1-33%) Fat Layer (Subcutaneous Tissue) Exposed: Yes Necrotic Quality: Adherent Slough Tendon Exposed: Yes Muscle Exposed: No Joint Exposed: No Bone Exposed: No Treatment Notes Wound #23 (Ankle) Wound Laterality: Left, Posterior Cleanser Soap and Water Discharge Instruction: May shower and wash wound with dial antibacterial soap and water prior to dressing change. Wound Cleanser Discharge Instruction: Cleanse the wound with wound cleanser prior to applying a clean dressing using gauze sponges, not tissue or cotton balls. Peri-Wound  Care Triamcinolone 15 (g) Discharge Instruction: Use triamcinolone 15 (g) to periwound redness with dressing changes Zinc Oxide Ointment 30g tube Discharge Instruction: Apply Zinc Oxide to periwound with each dressing change as needed Topical Primary Dressing Promogran Prisma Matrix, 4.34 (sq in) (silver collagen) Discharge Instruction: Moisten collagen with saline or hydrogel Secondary Dressing Woven Gauze Sponges 2x2 in Discharge Instruction: moisten with saline and Apply over santyl to fill the wound Zetuvit Plus Silicone Border Dressing 7x7(in/in) Discharge Instruction: Apply silicone border over primary dressing as directed. Secured With 18M Medipore H Soft Cloth Surgical T ape, 4 x 10 (in/yd) Discharge Instruction: Secure with tape as needed Compression Wrap Compression Stockings Add-Ons Electronic Signature(s) Signed: 08/14/2022 4:51:35 PM By: Baruch Gouty RN, BSN Entered By: Baruch Gouty on 08/14/2022 13:01:25 -------------------------------------------------------------------------------- [place] Details Patient Name: Date of Service: [name], [place] Y. 08/14/2022 12:30 PM Medical Record Number: [id number] Patient Account Number: [id number] Date of Birth/Sex: Treating RN: 09/21/1956 (66 y.o. [name] Primary Care Tiwanda Threats: [name] Other Clinician: Referring Jessieca Rhem: Treating Lyrik Buresh/Extender: Casandra Doffing in Treatment: 30 Vital Signs Time Taken: 12:42 Temperature (F): 97.7 Height (in): 64 Pulse (bpm): 60 Weight (lbs): 175 Respiratory Rate (breaths/min): 18 Body Mass Index (BMI): 30 Blood Pressure (mmHg): 106/70 Reference Range: 80 - 120 mg / dl Electronic Signature(s) Signed: 08/14/2022 4:51:35 PM By: Baruch Gouty RN, BSN Entered By: Baruch Gouty on 08/14/2022 12:42:58
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
## 2022-09-01 ENCOUNTER — Other Ambulatory Visit: Payer: Self-pay | Admitting: Physician Assistant

## 2022-09-01 DIAGNOSIS — G8250 Quadriplegia, unspecified: Secondary | ICD-10-CM

## 2022-09-05 ENCOUNTER — Telehealth: Payer: Self-pay

## 2022-09-07 ENCOUNTER — Other Ambulatory Visit: Payer: Self-pay | Admitting: Physician Assistant

## 2022-09-07 DIAGNOSIS — G8250 Quadriplegia, unspecified: Secondary | ICD-10-CM

## 2022-09-07 DIAGNOSIS — N319 Neuromuscular dysfunction of bladder, unspecified: Secondary | ICD-10-CM

## 2022-09-11 ENCOUNTER — Encounter (HOSPITAL_BASED_OUTPATIENT_CLINIC_OR_DEPARTMENT_OTHER): Payer: Medicare Other | Attending: General Surgery | Admitting: General Surgery

## 2022-09-11 DIAGNOSIS — L89623 Pressure ulcer of left heel, stage 3: Secondary | ICD-10-CM | POA: Insufficient documentation

## 2022-09-11 DIAGNOSIS — X58XXXS Exposure to other specified factors, sequela: Secondary | ICD-10-CM | POA: Insufficient documentation

## 2022-09-11 DIAGNOSIS — S14109S Unspecified injury at unspecified level of cervical spinal cord, sequela: Secondary | ICD-10-CM | POA: Diagnosis not present

## 2022-09-11 DIAGNOSIS — L97828 Non-pressure chronic ulcer of other part of left lower leg with other specified severity: Secondary | ICD-10-CM | POA: Insufficient documentation

## 2022-09-11 NOTE — Progress Notes (Signed)
[name], [name] ([id number]) [id number] Page 1 of 9 Visit Report for 09/11/2022 Arrival Information Details Patient Name: Date of Service: [name], Angela 09/11/2022 12:30 PM Medical Record Number: [id number] Patient Account Number: [id number] Date of Birth/Sex: Treating RN: [birth year] (66 [name] [name]: [name] Other Clinician: Referring Sona Nations: Treating Solange Emry/Extender: Casandra Doffing in Treatment: 74 Visit Information History Since Last Visit Added or deleted any medications: No Patient Arrived: Wheel Chair Any new allergies or adverse reactions: No Arrival Time: 12:44 Had a fall or experienced change in No Accompanied By: sister activities of daily living that may affect Transfer Assistance: None risk of falls: Patient Identification Verified: Yes Signs or symptoms of abuse/neglect since last visito No Secondary Verification Process Completed: Yes Hospitalized since last visit: No Patient Requires Transmission-Based Precautions: No Implantable device outside of the clinic excluding No Patient Has Alerts: Yes cellular tissue based products placed in the center Patient Alerts: Patient on Blood Thinner since last visit: Coumadin Has Dressing in Place as Prescribed: Yes Has Footwear/Offloading in Place as Prescribed: Yes Left: Multipodus Split/Boot Right: Multipodus Split/Boot Pain Present Now: No Notes stays in wheelchair Electronic Signature(s) Signed: 09/11/2022 4:29:03 PM By: Baruch Gouty RN, BSN Entered By: Baruch Gouty on 09/11/2022 12:46:11 -------------------------------------------------------------------------------- Encounter Discharge Information Details Patient Name: Date of Service: [name], Angela [place] Y. 09/11/2022 12:30 PM Medical Record Number: [id number] Patient Account Number: [id number] Date of Birth/Sex: Treating RN: 1956/05/07 (27 [name] [name]: [name] Other Clinician: Referring Adah Stoneberg: Treating Magali Bray/Extender: Casandra Doffing in Treatment: 48 Encounter Discharge Information Items Post Procedure Vitals Discharge Condition: Stable Temperature (F): 97.9 Ambulatory Status: Wheelchair Pulse (bpm): 69 Discharge Destination: Home Respiratory Rate (breaths/min): 18 Transportation: Private Auto Blood Pressure (mmHg): 109/71 Accompanied By: sister Schedule Follow-up Appointment: Yes Clinical Summary of Care: Patient Declined Electronic Signature(s) Signed: 09/11/2022 4:29:03 PM By: Baruch Gouty RN, BSN Entered By: Baruch Gouty on 09/11/2022 13:36:02 [place]ovray, [place] Y ([id number]) [id number] Page 2 of 9 -------------------------------------------------------------------------------- Lower Extremity Assessment Details Patient Name: Date of Service: [name], Angela 09/11/2022 12:30 PM Medical Record Number: [id number] Patient Account Number: [id number] Date of Birth/Sex: Treating RN: February 19, 1956 (56 [name] Decari Duggar: [name] Other Clinician: Referring Kendale Rembold: Treating Delynda Sepulveda/Extender: Lourena Simmonds Weeks in Treatment: 34 Edema Assessment Assessed: [Left: No] [Right: No] Edema: [Left: Ye] [Right: s] Calf Left: Right: Point of Measurement: 31 cm From Medial Instep 27.5 cm Ankle Left: Right: Point of Measurement: 7 cm From Medial Instep 2 cm Vascular Assessment Pulses: Dorsalis Pedis Palpable: [Left:Yes] Electronic Signature(s) Signed: 09/11/2022 4:29:03 PM By: Baruch Gouty RN, BSN Entered By: Baruch Gouty on 09/11/2022 12:56:02 -------------------------------------------------------------------------------- Multi Wound Chart Details Patient Name: Date of Service: [name], [name]. 09/11/2022 12:30 PM Medical Record Number: [id number] Patient Account Number:  [id number] Date of Birth/Sex: Treating RN: [birth year] (66 [name]) Primary Care Takayla Baillie: [name] Other Clinician: Referring Alyssabeth Bruster: Treating Olanda Downie/Extender: Casandra Doffing in Treatment: 34 Vital Signs Height(in): 64 Pulse(bpm): 69 Weight(lbs): 175 Blood Pressure(mmHg): 109/71 Body Mass Index(BMI): 30 Temperature(F): 97.9 Respiratory Rate(breaths/min): 18 [22:Photos:] [N/A:N/A [id number] Page 3 of 9] Left Calcaneus Left, Posterior Ankle N/A Wound Location: Pressure Injury Pressure Injury N/A Wounding Event: Pressure Ulcer Pressure Ulcer N/A Primary Etiology: Cataracts, Hypotension, Neuropathy, Cataracts, Hypotension, Neuropathy, N/A Comorbid History: Quadriplegia Quadriplegia 11/03/2021 11/03/2021 N/A Date Acquired: 60 34 N/A Weeks of Treatment: Open Open N/A Wound  Status: No No N/A Wound Recurrence: 3x2.3x0.2 1.6x1.6x0.5 N/A Measurements L x W x D (cm) 5.419 2.011 N/A A (cm) : rea 1.084 1.005 N/A Volume (cm) : 13.80% 52.60% N/A % Reduction in A rea: 42.50% 40.70% N/A % Reduction in Volume: Category/Stage III Category/Stage IV N/A Classification: Medium Medium N/A Exudate A mount: Serosanguineous Serosanguineous N/A Exudate Type: red, brown red, brown N/A Exudate Color: Distinct, outline attached Flat and Intact N/A Wound Margin: Small (1-33%) None Present (0%) N/A Granulation A mount: Red N/A N/A Granulation Quality: Large (67-100%) Large (67-100%) N/A Necrotic A mount: Fat Layer (Subcutaneous Tissue): Yes Fat Layer (Subcutaneous Tissue): Yes N/A Exposed Structures: Fascia: No Tendon: Yes Tendon: No Fascia: No Muscle: No Muscle: No Joint: No Joint: No Bone: No Bone: No Small (1-33%) None N/A Epithelialization: Debridement - Selective/Open Wound Debridement - Excisional N/A Debridement: Pre-procedure Verification/Time Out 13:10 13:10 N/A Taken: [employer], [place]  N/A Tissue Debrided: Non-Viable Tissue Skin/Subcutaneous Tissue N/A Level: 6.9 2.56 N/A Debridement A (sq cm): rea Curette Curette N/A Instrument: Minimum Minimum N/A Bleeding: Pressure Pressure N/A Hemostasis A chieved: Insensate Insensate N/A Procedural Pain: Insensate Insensate N/A Post Procedural Pain: Procedure was tolerated well Procedure was tolerated well N/A Debridement Treatment Response: 3x2.3x0.2 1.6x1.6x0.5 N/A Post Debridement Measurements L x W x D (cm) 1.084 1.005 N/A Post Debridement Volume: (cm) Category/Stage III Category/Stage IV N/A Post Debridement Stage: No Abnormalities Noted No Abnormalities Noted N/A Periwound Skin Texture: No Abnormalities Noted No Abnormalities Noted N/A Periwound Skin Moisture: No Abnormalities Noted No Abnormalities Noted N/A Periwound Skin Color: No Abnormality No Abnormality N/A Temperature: Debridement Debridement N/A Procedures Performed: Treatment Notes Electronic Signature(s) Signed: 09/11/2022 1:23:00 PM By: Fredirick Maudlin MD FACS Entered By: Fredirick Maudlin on 09/11/2022 13:23:00 -------------------------------------------------------------------------------- Multi-Disciplinary Care Plan Details Patient Name: Date of Service: [name], Angela Y. 09/11/2022 12:30 PM Medical Record Number: [id number] Patient Account Number: [id number] Date of Birth/Sex: Treating RN: August 26, 1956 (11 [name] Copeland Neisen: [name] Other Clinician: Referring Eldra Word: Treating Snyder Colavito/Extender: Casandra Doffing in Treatment: [place] reviewed with physician [address] ([id number]) [id number] Page 4 of 9 Pressure Nursing Diagnoses: Knowledge deficit related to causes and risk factors for pressure ulcer development Knowledge deficit related to management of pressures ulcers Potential for impaired tissue  integrity related to pressure, friction, moisture, and shear Goals: Patient/caregiver will verbalize understanding of pressure ulcer management Date Initiated: 03/20/2022 Target Resolution Date: 10/16/2022 Goal Status: Active Interventions: Assess: immobility, friction, shearing, incontinence upon admission and as needed Assess offloading mechanisms upon admission and as needed Assess potential for pressure ulcer upon admission and as needed Notes: Wound/Skin Impairment Nursing Diagnoses: Impaired tissue integrity Knowledge deficit related to smoking impact on wound healing Goals: Patient/caregiver will verbalize understanding of skin care regimen Date Initiated: 01/12/2022 Target Resolution Date: 10/16/2022 Goal Status: Active Interventions: Assess patient/caregiver ability to obtain necessary supplies Assess patient/caregiver ability to perform ulcer/skin care regimen upon admission and as needed Assess ulceration(s) every visit Provide education on smoking Provide education on ulcer and skin care Notes: Electronic Signature(s) Signed: 09/11/2022 4:29:03 PM By: Baruch Gouty RN, BSN Entered By: Baruch Gouty on 09/11/2022 13:02:50 -------------------------------------------------------------------------------- Pain Assessment Details Patient Name: Date of Service: [name], [place] Y. 09/11/2022 12:30 PM Medical Record Number: [id number] Patient Account Number: [id number] Date of Birth/Sex: Treating RN: 1956-03-01 (34 [name] Twylia Oka: [name] Other Clinician: Referring Melvena Vink: Treating Viola Kinnick/Extender: Casandra Doffing in  Treatment: 34 Active Problems Location of Pain Severity and Description of Pain Patient Has Paino No Site Locations Rate the pain. [place], [place] Y ([id number]) [id number] Page 5 of 9 Rate the pain. Current Pain Level: 0 Pain Management and Medication Current  Pain Management: Electronic Signature(s) Signed: 09/11/2022 4:29:03 PM By: Baruch Gouty RN, BSN Entered By: Baruch Gouty on 09/11/2022 12:53:24 -------------------------------------------------------------------------------- Patient/Caregiver Education Details Patient Name: Date of Service: [name], [place] Cheryln Manly 10/9/2023andnbsp12:30 PM Medical Record Number: [id number] Patient Account Number: [id number] Date of Birth/Gender: Treating RN: [birth year] (30 [name] Physician: [name] Other Clinician: Referring Physician: Treating Physician/Extender: Casandra Doffing in Treatment: 42 Education Assessment Education Provided To: Patient Education Topics Provided Pressure: Methods: Explain/Verbal Responses: Reinforcements needed, State content correctly Wound/Skin Impairment: Methods: Explain/Verbal Responses: Reinforcements needed, State content correctly Electronic Signature(s) Signed: 09/11/2022 4:29:03 PM By: Baruch Gouty RN, BSN Entered By: Baruch Gouty on 09/11/2022 13:03:12 -------------------------------------------------------------------------------- Wound Assessment Details Patient Name: Date of Service: [name], [place] Y. 09/11/2022 12:30 PM [place], [name] ([id number]) [id number] Page 6 of 9 Medical Record Number: [id number] Patient Account Number: [id number] Date of Birth/Sex: Treating RN: [birth year] (16 [name] Christia Coaxum: [name] Other Clinician: Referring Deaunna Olarte: Treating Sybilla Malhotra/Extender: Lourena Simmonds Weeks in Treatment: 34 Wound Status Wound Number: 22 Primary Etiology: Pressure Ulcer Wound Location: Left Calcaneus Wound Status: Open Wounding Event: Pressure Injury Comorbid History: Cataracts, Hypotension, Neuropathy, Quadriplegia Date Acquired: 11/03/2021 Weeks Of Treatment: 34 Clustered Wound:  No Photos Wound Measurements Length: (cm) 3 Width: (cm) 2.3 Depth: (cm) 0.2 Area: (cm) 5.419 Volume: (cm) 1.084 % Reduction in Area: 13.8% % Reduction in Volume: 42.5% Epithelialization: Small (1-33%) Tunneling: No Undermining: No Wound Description Classification: Category/Stage III Wound Margin: Distinct, outline attached Exudate Amount: Medium Exudate Type: Serosanguineous Exudate Color: red, brown Foul Odor After Cleansing: No Slough/Fibrino Yes Wound Bed Granulation Amount: Small (1-33%) Exposed Structure Granulation Quality: Red Fascia Exposed: No Necrotic Amount: Large (67-100%) Fat Layer (Subcutaneous Tissue) Exposed: Yes Necrotic Quality: Adherent Slough Tendon Exposed: No Muscle Exposed: No Joint Exposed: No Bone Exposed: No Periwound Skin Texture Texture Color No Abnormalities Noted: Yes No Abnormalities Noted: Yes Moisture Temperature / Pain No Abnormalities Noted: Yes Temperature: No Abnormality Treatment Notes Wound #22 (Calcaneus) Wound Laterality: Left Cleanser Soap and Water Discharge Instruction: May shower and wash wound with dial antibacterial soap and water prior to dressing change. Wound Cleanser Discharge Instruction: Cleanse the wound with wound cleanser prior to applying a clean dressing using gauze sponges, not tissue or cotton balls. Peri-Wound Care Triamcinolone 15 (g) Discharge Instruction: Use triamcinolone 15 (g) to periwound redness with dressing changes Zinc Oxide Ointment 30g tube Discharge Instruction: Apply Zinc Oxide to periwound with each dressing change as needed BITHA, [name] ([id number]) [id number] Page 7 of 9 Topical Primary Dressing Promogran Prisma Matrix, 4.34 (sq in) (silver collagen) Discharge Instruction: Moisten collagen with saline or hydrogel Secondary Dressing Woven Gauze Sponges 2x2 in Discharge Instruction: moisten with saline and Apply over santyl to fill the wound Zetuvit  Plus Silicone Border Dressing 7x7(in/in) Discharge Instruction: Apply silicone border over primary dressing as directed. Secured With 37M Medipore H Soft Cloth Surgical T ape, 4 x 10 (in/yd) Discharge Instruction: Secure with tape as needed Compression Wrap Compression Stockings Add-Ons Electronic Signature(s) Signed: 09/11/2022 4:29:03 PM By: Baruch Gouty RN, BSN Entered By: Baruch Gouty on 09/11/2022 13:05:43 -------------------------------------------------------------------------------- Wound Assessment Details Patient Name: Date of Service: Angela Balzarine, Angela RTHA  Y. 09/11/2022 12:30 PM Medical Record Number: [id number] Patient Account Number: [id number] Date of Birth/Sex: Treating RN: 1955-12-30 (46 [name] Estera Ozier: [name] Other Clinician: Referring Electa Sterry: Treating Zykee Avakian/Extender: Lourena Simmonds Weeks in Treatment: 34 Wound Status Wound Number: 23 Primary Etiology: Pressure Ulcer Wound Location: Left, Posterior Ankle Wound Status: Open Wounding Event: Pressure Injury Comorbid History: Cataracts, Hypotension, Neuropathy, Quadriplegia Date Acquired: 11/03/2021 Weeks Of Treatment: 34 Clustered Wound: No Photos Wound Measurements Length: (cm) 1.6 Width: (cm) 1.6 Depth: (cm) 0.5 Area: (cm) 2.011 Volume: (cm) 1.005 % Reduction in Area: 52.6% % Reduction in Volume: 40.7% Epithelialization: None Tunneling: No Undermining: No Wound Description ASHANNA, [name] ([id number]) Classification: Category/Stage IV Wound Margin: Flat and Intact Exudate Amount: Medium Exudate Type: Serosanguineous Exudate Color: red, brown [id number] Page 8 of 9 Foul Odor After Cleansing: No Slough/Fibrino Yes Wound Bed Granulation Amount: None Present (0%) Exposed Structure Necrotic Amount: Large (67-100%) Fascia Exposed: No Necrotic Quality: Adherent Slough Fat Layer (Subcutaneous Tissue) Exposed:  Yes Tendon Exposed: Yes Muscle Exposed: No Joint Exposed: No Bone Exposed: No Periwound Skin Texture Texture Color No Abnormalities Noted: Yes No Abnormalities Noted: Yes Moisture Temperature / Pain No Abnormalities Noted: Yes Temperature: No Abnormality Treatment Notes Wound #23 (Ankle) Wound Laterality: Left, Posterior Cleanser Soap and Water Discharge Instruction: May shower and wash wound with dial antibacterial soap and water prior to dressing change. Wound Cleanser Discharge Instruction: Cleanse the wound with wound cleanser prior to applying a clean dressing using gauze sponges, not tissue or cotton balls. Peri-Wound Care Triamcinolone 15 (g) Discharge Instruction: Use triamcinolone 15 (g) to periwound redness with dressing changes Zinc Oxide Ointment 30g tube Discharge Instruction: Apply Zinc Oxide to periwound with each dressing change as needed Topical Primary Dressing Promogran Prisma Matrix, 4.34 (sq in) (silver collagen) Discharge Instruction: Moisten collagen with saline or hydrogel Secondary Dressing Woven Gauze Sponges 2x2 in Discharge Instruction: moisten with saline and Apply over santyl to fill the wound Zetuvit Plus Silicone Border Dressing 7x7(in/in) Discharge Instruction: Apply silicone border over primary dressing as directed. Secured With 106M Medipore H Soft Cloth Surgical T ape, 4 x 10 (in/yd) Discharge Instruction: Secure with tape as needed Compression Wrap Compression Stockings Add-Ons Electronic Signature(s) Signed: 09/11/2022 4:29:03 PM By: Baruch Gouty RN, BSN Entered By: Baruch Gouty on 09/11/2022 13:06:16 -------------------------------------------------------------------------------- [place] Details Patient Name: Date of Service: [name], [place] Y. 09/11/2022 12:30 PM [place], [name] ([id number]) [id number] Page 9 of 9 Medical Record Number: [id number] Patient Account Number: [id number] Date of Birth/Sex:  Treating RN: [birth year] (59 [name] Juanette Urizar: [name] Other Clinician: Referring Nakiya Rallis: Treating Perley Arthurs/Extender: Casandra Doffing in Treatment: 34 Vital Signs Time Taken: 12:46 Temperature (F): 97.9 Height (in): 64 Pulse (bpm): 69 Weight (lbs): 175 Respiratory Rate (breaths/min): 18 Body Mass Index (BMI): 30 Blood Pressure (mmHg): 109/71 Reference Range: 80 - 120 mg / dl Electronic Signature(s) Signed: 09/11/2022 4:29:03 PM By: Baruch Gouty RN, BSN Entered By: Baruch Gouty on 09/11/2022 12:53:15
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
## 2022-09-13 NOTE — Telephone Encounter (Signed)
Note not needed 

## 2022-09-19 LAB — POCT INR: INR: 3.5 — AB (ref 0.80–1.20)

## 2022-09-21 ENCOUNTER — Telehealth: Payer: Self-pay

## 2022-09-21 NOTE — Telephone Encounter (Signed)
Spoke with patient, expressed understanding concerning INR!

## 2022-10-09 ENCOUNTER — Encounter (HOSPITAL_BASED_OUTPATIENT_CLINIC_OR_DEPARTMENT_OTHER): Payer: Medicare Other | Attending: General Surgery | Admitting: General Surgery

## 2022-10-09 ENCOUNTER — Telehealth: Payer: Self-pay | Admitting: *Deleted

## 2022-10-09 DIAGNOSIS — L97828 Non-pressure chronic ulcer of other part of left lower leg with other specified severity: Secondary | ICD-10-CM | POA: Insufficient documentation

## 2022-10-09 DIAGNOSIS — Z7901 Long term (current) use of anticoagulants: Secondary | ICD-10-CM | POA: Diagnosis not present

## 2022-10-09 DIAGNOSIS — E039 Hypothyroidism, unspecified: Secondary | ICD-10-CM | POA: Diagnosis not present

## 2022-10-09 DIAGNOSIS — G8250 Quadriplegia, unspecified: Secondary | ICD-10-CM | POA: Diagnosis not present

## 2022-10-09 DIAGNOSIS — L89623 Pressure ulcer of left heel, stage 3: Secondary | ICD-10-CM | POA: Insufficient documentation

## 2022-10-09 LAB — POCT INR: INR: 5.6 — AB (ref 0.80–1.20)

## 2022-10-09 NOTE — Progress Notes (Signed)
[place], [place] Y ([id number]) [id number] Page 1 of 9 Visit Report for 10/09/2022 Arrival Information Details Patient Name: Date of Service: [place], [name] 10/09/2022 1:15 PM Medical Record Number: [id number] Patient Account Number: [id number] Date of Birth/Sex: Treating RN: August 08, 1956 (66 y.o. [name] Primary Care Thelia Tanksley: [name] Other Clinician: Referring Madilyne Tadlock: Treating Iyania Denne/Extender: Casandra Doffing in Treatment: 110 Visit Information History Since Last Visit Added or deleted any medications: No Patient Arrived: Wheel Chair Any new allergies or adverse reactions: No Arrival Time: 13:22 Had a fall or experienced change in No Accompanied By: daughter activities of daily living that may affect Transfer Assistance: None risk of falls: Patient Identification Verified: Yes Signs or symptoms of abuse/neglect since last visito No Secondary Verification Process Completed: Yes Hospitalized since last visit: No Patient Requires Transmission-Based Precautions: No Implantable device outside of the clinic excluding No Patient Has Alerts: Yes cellular tissue based products placed in the center Patient Alerts: Patient on Blood Thinner since last visit: Coumadin Has Dressing in Place as Prescribed: Yes Has Footwear/Offloading in Place as Prescribed: Yes Left: Multipodus Split/Boot Pain Present Now: No Notes stays in wheelchair Electronic Signature(s) Signed: 10/09/2022 4:54:31 PM By: Baruch Gouty RN, BSN Entered By: Baruch Gouty on 10/09/2022 13:23:47 -------------------------------------------------------------------------------- Encounter Discharge Information Details Patient Name: Date of Service: [name], [place]. 10/09/2022 1:15 PM Medical Record Number: [id number] Patient Account Number: [id number] Date of Birth/Sex: Treating RN: 11/07/1956 (66 y.o. [name] Primary Care Milam Allbaugh:  [name] Other Clinician: Referring Aniayah Alaniz: Treating Suriya Kovarik/Extender: Casandra Doffing in Treatment: 31 Encounter Discharge Information Items Post Procedure Vitals Discharge Condition: Stable Temperature (F): 97.7 Ambulatory Status: Wheelchair Pulse (bpm): 53 Discharge Destination: Home Respiratory Rate (breaths/min): 18 Transportation: Private Auto Blood Pressure (mmHg): 120/59 Accompanied By: daughter Schedule Follow-up Appointment: Yes Clinical Summary of Care: Patient Declined Electronic Signature(s) Signed: 10/09/2022 4:54:31 PM By: Baruch Gouty RN, BSN Entered By: Baruch Gouty on 10/09/2022 14:13:10 [place], [place] Y ([id number]) [id number] Page 2 of 9 -------------------------------------------------------------------------------- Lower Extremity Assessment Details Patient Name: Date of Service: [name], [name] 10/09/2022 1:15 PM Medical Record Number: [id number] Patient Account Number: [id number] Date of Birth/Sex: Treating RN: 06/09/1956 (66 y.o. [name] Primary Care Allon Costlow: [name] Other Clinician: Referring Tatjana Turcott: Treating [place]aic Welliver/Extender: Lourena Simmonds Weeks in Treatment: 38 Edema Assessment Assessed: [Left: No] [Right: No] Edema: [Left: Ye] [Right: s] Calf Left: Right: Point of Measurement: 31 cm From Medial Instep 28.5 cm Ankle Left: Right: Point of Measurement: 7 cm From Medial Instep 22.1 cm Vascular Assessment Pulses: Dorsalis Pedis Palpable: [Left:Yes] Electronic Signature(s) Signed: 10/09/2022 4:54:31 PM By: Baruch Gouty RN, BSN Entered By: Baruch Gouty on 10/09/2022 13:33:45 -------------------------------------------------------------------------------- Multi Wound Chart Details Patient Name: Date of Service: [name], [place] Y. 10/09/2022 1:15 PM Medical Record Number: [id number] Patient Account Number: [id number] Date of  Birth/Sex: Treating RN: Mar 08, 1956 (66 y.o. F) Primary Care Sonoma Firkus: [name] Other Clinician: Referring Vlada Uriostegui: Treating Beverlie Kurihara/Extender: Casandra Doffing in Treatment: 56 Vital Signs Height(in): 64 Pulse(bpm): 53 Weight(lbs): 175 Blood Pressure(mmHg): 120/59 Body Mass Index(BMI): 30 Temperature(F): 97.7 Respiratory Rate(breaths/min): 18 [22:Photos:] [N/A:N/A [id number] Page 3 of 9] Left Calcaneus Left, Posterior Ankle N/A Wound Location: Pressure Injury Pressure Injury N/A Wounding Event: Pressure Ulcer Pressure Ulcer N/A Primary Etiology: Cataracts, Hypotension, Neuropathy, Cataracts, Hypotension, Neuropathy, N/A Comorbid History: Quadriplegia Quadriplegia 11/03/2021 11/03/2021 N/A Date Acquired: 41 38 N/A Weeks of Treatment: Open Open N/A Wound Status: No No  N/A Wound Recurrence: 1.9x2.5x0.1 1.5x1.5x0.5 N/A Measurements L x W x D (cm) 3.731 1.767 N/A A (cm) : rea 0.373 0.884 N/A Volume (cm) : 40.60% 58.30% N/A % Reduction in A rea: 80.20% 47.90% N/A % Reduction in Volume: Category/Stage III Category/Stage IV N/A Classification: Medium Medium N/A Exudate A mount: Serosanguineous Serosanguineous N/A Exudate Type: red, brown red, brown N/A Exudate Color: Distinct, outline attached Flat and Intact N/A Wound Margin: Large (67-100%) Small (1-33%) N/A Granulation A mount: Red Red N/A Granulation Quality: Small (1-33%) Large (67-100%) N/A Necrotic A mount: Fat Layer (Subcutaneous Tissue): Yes Fat Layer (Subcutaneous Tissue): Yes N/A Exposed Structures: Fascia: No Tendon: Yes Tendon: No Fascia: No Muscle: No Muscle: No Joint: No Joint: No Bone: No Bone: No Small (1-33%) None N/A Epithelialization: Debridement - Selective/Open Wound Debridement - Selective/Open Wound N/A Debridement: Pre-procedure Verification/Time Out 13:50 13:50 N/A Taken: Non-Viable Tissue Non-Viable Tissue  N/A Level: 4.75 2.25 N/A Debridement A (sq cm): rea Curette Curette N/A Instrument: Minimum Minimum N/A Bleeding: Pressure Pressure N/A Hemostasis A chieved: Insensate Insensate N/A Procedural Pain: Insensate Insensate N/A Post Procedural Pain: Procedure was tolerated well Procedure was tolerated well N/A Debridement Treatment Response: 1.9x2.5x0.1 1.5x1.5x0.5 N/A Post Debridement Measurements L x W x D (cm) 0.373 0.884 N/A Post Debridement Volume: (cm) Category/Stage III Category/Stage IV N/A Post Debridement Stage: No Abnormalities Noted No Abnormalities Noted N/A Periwound Skin Texture: No Abnormalities Noted No Abnormalities Noted N/A Periwound Skin Moisture: No Abnormalities Noted No Abnormalities Noted N/A Periwound Skin Color: No Abnormality No Abnormality N/A Temperature: Debridement Debridement N/A Procedures Performed: Treatment Notes Electronic Signature(s) Signed: 10/09/2022 2:10:57 PM By: Fredirick Maudlin MD FACS Entered By: Fredirick Maudlin on 10/09/2022 14:10:57 -------------------------------------------------------------------------------- Multi-Disciplinary Care Plan Details Patient Name: Date of Service: [name], [place] Y. 10/09/2022 1:15 PM Medical Record Number: [id number] Patient Account Number: [id number] Date of Birth/Sex: Treating RN: 06-01-1956 (66 y.o. [name] Primary Care Clevie Prout: [name] Other Clinician: Referring Gretna Bergin: Treating Lula Michaux/Extender: Casandra Doffing in Treatment: [place] reviewed with physician [address] ([id number]) [id number] Page 4 of 9 Pressure Nursing Diagnoses: Knowledge deficit related to causes and risk factors for pressure ulcer development Knowledge deficit related to management of pressures ulcers Potential for impaired tissue integrity related to pressure, friction, moisture, and  shear Goals: Patient/caregiver will verbalize understanding of pressure ulcer management Date Initiated: 03/20/2022 Target Resolution Date: 10/16/2022 Goal Status: Active Interventions: Assess: immobility, friction, shearing, incontinence upon admission and as needed Assess offloading mechanisms upon admission and as needed Assess potential for pressure ulcer upon admission and as needed Notes: Wound/Skin Impairment Nursing Diagnoses: Impaired tissue integrity Knowledge deficit related to smoking impact on wound healing Goals: Patient/caregiver will verbalize understanding of skin care regimen Date Initiated: 01/12/2022 Target Resolution Date: 10/16/2022 Goal Status: Active Interventions: Assess patient/caregiver ability to obtain necessary supplies Assess patient/caregiver ability to perform ulcer/skin care regimen upon admission and as needed Assess ulceration(s) every visit Provide education on smoking Provide education on ulcer and skin care Notes: Electronic Signature(s) Signed: 10/09/2022 4:54:31 PM By: Baruch Gouty RN, BSN Entered By: Baruch Gouty on 10/09/2022 13:39:25 -------------------------------------------------------------------------------- Pain Assessment Details Patient Name: Date of Service: [name], [place] Y. 10/09/2022 1:15 PM Medical Record Number: [id number] Patient Account Number: [id number] Date of Birth/Sex: Treating RN: [birth year] (66 y.o. [name] Primary Care Letonia Stead: [name] Other Clinician: Referring Wilba Mutz: Treating Mazie Fencl/Extender: Casandra Doffing in Treatment: 58 Active Problems Location of Pain Severity and  Description of Pain Patient Has Paino No Site Locations Rate the pain. [place], [place] Y ([id number]) [id number] Page 5 of 9 Rate the pain. Current Pain Level: 0 Pain Management and Medication Current Pain Management: Electronic Signature(s) Signed:  10/09/2022 4:54:31 PM By: Baruch Gouty RN, BSN Entered By: Baruch Gouty on 10/09/2022 13:24:22 -------------------------------------------------------------------------------- Patient/Caregiver Education Details Patient Name: Date of Service: Angela [place]zar[place], [place] Cheryln Manly 11/6/2023andnbsp1:15 PM Medical Record Number: [id number] Patient Account Number: [id number] Date of Birth/Gender: Treating RN: 11/24/1956 (66 y.o. [name] Primary Care Physician: [name] Other Clinician: Referring Physician: Treating Physician/Extender: Casandra Doffing in Treatment: 8 Education Assessment Education Provided To: Patient Education Topics Provided Pressure: Methods: Explain/Verbal Responses: Reinforcements needed, State content correctly Wound/Skin Impairment: Methods: Explain/Verbal Responses: Reinforcements needed, State content correctly Electronic Signature(s) Signed: 10/09/2022 4:54:31 PM By: Baruch Gouty RN, BSN Entered By: Baruch Gouty on 10/09/2022 13:39:58 -------------------------------------------------------------------------------- Wound Assessment Details Patient Name: Date of Service: [name], [place] Y. 10/09/2022 1:15 PM [place], Juanell Fairly ([id number]) [id number] Page 6 of 9 Medical Record Number: [id number] Patient Account Number: [id number] Date of Birth/Sex: Treating RN: 1956-08-28 (66 y.o. [name] Primary Care Lonie Rummell: [name] Other Clinician: Referring Eileene Kisling: Treating Sonal Dorwart/Extender: Lourena Simmonds Weeks in Treatment: 77 Wound Status Wound Number: 22 Primary Etiology: Pressure Ulcer Wound Location: Left Calcaneus Wound Status: Open Wounding Event: Pressure Injury Comorbid History: Cataracts, Hypotension, Neuropathy, Quadriplegia Date Acquired: 11/03/2021 Weeks Of Treatment: 38 Clustered Wound: No Photos Wound Measurements Length: (cm) 1.9 Width: (cm)  2.5 Depth: (cm) 0.1 Area: (cm) 3.731 Volume: (cm) 0.373 % Reduction in Area: 40.6% % Reduction in Volume: 80.2% Epithelialization: Small (1-33%) Tunneling: No Undermining: No Wound Description Classification: Category/Stage III Wound Margin: Distinct, outline attached Exudate Amount: Medium Exudate Type: Serosanguineous Exudate Color: red, brown Foul Odor After Cleansing: No Slough/Fibrino Yes Wound Bed Granulation Amount: Large (67-100%) Exposed Structure Granulation Quality: Red Fascia Exposed: No Necrotic Amount: Small (1-33%) Fat Layer (Subcutaneous Tissue) Exposed: Yes Necrotic Quality: Adherent Slough Tendon Exposed: No Muscle Exposed: No Joint Exposed: No Bone Exposed: No Periwound Skin Texture Texture Color No Abnormalities Noted: Yes No Abnormalities Noted: Yes Moisture Temperature / Pain No Abnormalities Noted: Yes Temperature: No Abnormality Treatment Notes Wound #22 (Calcaneus) Wound Laterality: Left Cleanser Soap and Water Discharge Instruction: May shower and wash wound with dial antibacterial soap and water prior to dressing change. Wound Cleanser Discharge Instruction: Cleanse the wound with wound cleanser prior to applying a clean dressing using gauze sponges, not tissue or cotton balls. Peri-Wound Care Topical Skintegrity Hydrogel 4 (oz) Discharge Instruction: Apply hydrogel thin layer over collagen LADASIA, SIRCY ([id number]) [id number].pdf Page 7 of 9 Primary Dressing Promogran Prisma Matrix, 4.34 (sq in) (silver collagen) Discharge Instruction: Moisten collagen with saline or hydrogel Secondary Dressing Woven Gauze Sponges 2x2 in Discharge Instruction: moisten with saline and Apply over santyl to fill the wound Zetuvit Plus Silicone Border Dressing 7x7(in/in) Discharge Instruction: Apply silicone border over primary dressing as directed. Secured With 46M Medipore H Soft Cloth Surgical T ape, 4 x 10  (in/yd) Discharge Instruction: Secure with tape as needed Compression Wrap Compression Stockings Add-Ons Electronic Signature(s) Signed: 10/09/2022 4:54:31 PM By: Baruch Gouty RN, BSN Entered By: Baruch Gouty on 10/09/2022 13:45:12 -------------------------------------------------------------------------------- Wound Assessment Details Patient Name: Date of Service: [name], [place] Y. 10/09/2022 1:15 PM Medical Record Number: [id number] Patient Account Number: [id number] Date of Birth/Sex: Treating RN: [birth year] (66 y.o. [name] Primary Care Elian Gloster: [name] Other Clinician:  Referring Aymee Fomby: Treating Zachry Hopfensperger/Extender: Lourena Simmonds Weeks in Treatment: 38 Wound Status Wound Number: 23 Primary Etiology: Pressure Ulcer Wound Location: Left, Posterior Ankle Wound Status: Open Wounding Event: Pressure Injury Comorbid History: Cataracts, Hypotension, Neuropathy, Quadriplegia Date Acquired: 11/03/2021 Weeks Of Treatment: 38 Clustered Wound: No Photos Wound Measurements Length: (cm) 1.5 Width: (cm) 1.5 Depth: (cm) 0.5 Area: (cm) 1.767 Volume: (cm) 0.884 % Reduction in Area: 58.3% % Reduction in Volume: 47.9% Epithelialization: None Tunneling: No Undermining: No Wound Description Classification: Category/Stage IV Wound Margin: Flat and Intact Exudate Amount: LEISEL, PINETTE ([id number]) Exudate Type: Serosanguineous Exudate Color: red, brown Foul Odor After Cleansing: No Slough/Fibrino Yes [id number] Page 8 of 9 Wound Bed Granulation Amount: Small (1-33%) Exposed Structure Granulation Quality: Red Fascia Exposed: No Necrotic Amount: Large (67-100%) Fat Layer (Subcutaneous Tissue) Exposed: Yes Necrotic Quality: Adherent Slough Tendon Exposed: Yes Muscle Exposed: No Joint Exposed: No Bone Exposed: No Periwound Skin Texture Texture Color No Abnormalities Noted: Yes No Abnormalities  Noted: Yes Moisture Temperature / Pain No Abnormalities Noted: Yes Temperature: No Abnormality Treatment Notes Wound #23 (Ankle) Wound Laterality: Left, Posterior Cleanser Soap and Water Discharge Instruction: May shower and wash wound with dial antibacterial soap and water prior to dressing change. Wound Cleanser Discharge Instruction: Cleanse the wound with wound cleanser prior to applying a clean dressing using gauze sponges, not tissue or cotton balls. Peri-Wound Care Topical Skintegrity Hydrogel 4 (oz) Discharge Instruction: Apply hydrogel thin layer over collagen Primary Dressing Promogran Prisma Matrix, 4.34 (sq in) (silver collagen) Discharge Instruction: Moisten collagen with saline or hydrogel Secondary Dressing Woven Gauze Sponges 2x2 in Discharge Instruction: moisten with saline and Apply over santyl to fill the wound Zetuvit Plus Silicone Border Dressing 7x7(in/in) Discharge Instruction: Apply silicone border over primary dressing as directed. Secured With 25M Medipore H Soft Cloth Surgical T ape, 4 x 10 (in/yd) Discharge Instruction: Secure with tape as needed Compression Wrap Compression Stockings Add-Ons Electronic Signature(s) Signed: 10/09/2022 4:54:31 PM By: Baruch Gouty RN, BSN Entered By: Baruch Gouty on 10/09/2022 13:45:48 -------------------------------------------------------------------------------- [place] Details Patient Name: Date of Service: [name], [place] Y. 10/09/2022 1:15 PM Medical Record Number: [id number] Patient Account Number: [id number] Date of Birth/Sex: Treating RN: [birth year] (66 y.o. [name] Primary Care Jerris Fleer: [name] Other Clinician: Referring Courtny Bennison: Treating Sherman Lipuma/Extender: Casandra Doffing in Treatment: [address] [place] ([id number]) [phone].pdf Page 9 of 9 Vital Signs Time Taken: 13:23 Temperature (F): 97.7 Height (in): 64 Pulse (bpm):  53 Weight (lbs): 175 Respiratory Rate (breaths/min): 18 Body Mass Index (BMI): 30 Blood Pressure (mmHg): 120/59 Reference Range: 80 - 120 mg / dl Electronic Signature(s) Signed: 10/09/2022 4:54:31 PM By: Baruch Gouty RN, BSN Entered By: Baruch Gouty on 10/09/2022 13:24:13
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
## 2022-10-09 NOTE — Telephone Encounter (Signed)
Pt called to report that her INR was 5.6 which is not normal for her, she said that she has not changed anything and wanted to let the doctor know and see if she may can come in and have it checked to see if the number matches what she has or if the provider could give her any insight into why it is up.  She is concerned about the number and worried it may affect her liver.  Stated that her father recently died due to liver cancer. I told her she could come in and have a lab visit and she said she would have to call back once she finds out when she could get a ride. Ashleah Valtierra Zimmerman Rumple, CMA

## 2022-10-10 ENCOUNTER — Encounter: Payer: Self-pay | Admitting: Physician Assistant

## 2022-10-12 ENCOUNTER — Ambulatory Visit (HOSPITAL_BASED_OUTPATIENT_CLINIC_OR_DEPARTMENT_OTHER): Payer: Medicare Other | Admitting: General Surgery

## 2022-10-12 ENCOUNTER — Encounter: Payer: Medicare Other | Admitting: Physician Assistant

## 2022-10-18 LAB — POCT INR: INR: 1.5 — AB (ref 0.80–1.20)

## 2022-11-02 ENCOUNTER — Encounter: Payer: Self-pay | Admitting: Nurse Practitioner

## 2022-11-02 ENCOUNTER — Other Ambulatory Visit: Payer: Self-pay | Admitting: Nurse Practitioner

## 2022-11-02 ENCOUNTER — Telehealth: Payer: Self-pay

## 2022-11-02 DIAGNOSIS — G8250 Quadriplegia, unspecified: Secondary | ICD-10-CM

## 2022-11-02 LAB — POCT INR: INR: 5 — AB (ref 0.80–1.20)

## 2022-11-02 MED ORDER — WARFARIN SODIUM 2 MG PO TABS: ORAL_TABLET | ORAL | 5 refills | Status: DC

## 2022-11-02 MED ORDER — WARFARIN SODIUM 4 MG PO TABS: ORAL_TABLET | ORAL | 5 refills | Status: DC

## 2022-11-02 NOTE — Telephone Encounter (Signed)
Per instruction of Vickery patient was informed that her new Coumadin schedule of 2 mg on Monday, Wednesday, Friday, Saturday and Sunday as well as 4 mg on Tuesday and Thursday. Patient was concerned that since her levels tested high if she should not take Coumadin for a couple of days. Boscia, FNP-C recommended that since patient did not take a dose today that patient can resume medication on Saturday. Patient has verbalized understanding and agreed with recommendation. All questions and concerns have been addressed.

## 2022-11-06 ENCOUNTER — Telehealth: Payer: Self-pay | Admitting: *Deleted

## 2022-11-06 ENCOUNTER — Encounter (HOSPITAL_BASED_OUTPATIENT_CLINIC_OR_DEPARTMENT_OTHER): Payer: Medicare Other | Attending: General Surgery | Admitting: General Surgery

## 2022-11-06 ENCOUNTER — Other Ambulatory Visit: Payer: Self-pay

## 2022-11-06 ENCOUNTER — Encounter: Payer: Self-pay | Admitting: Nurse Practitioner

## 2022-11-06 ENCOUNTER — Ambulatory Visit (INDEPENDENT_AMBULATORY_CARE_PROVIDER_SITE_OTHER): Payer: Medicare Other | Admitting: Nurse Practitioner

## 2022-11-06 VITALS — BP 136/86 | HR 125 | Resp 18

## 2022-11-06 DIAGNOSIS — L89623 Pressure ulcer of left heel, stage 3: Secondary | ICD-10-CM | POA: Diagnosis not present

## 2022-11-06 DIAGNOSIS — Z23 Encounter for immunization: Secondary | ICD-10-CM | POA: Diagnosis not present

## 2022-11-06 DIAGNOSIS — S14109S Unspecified injury at unspecified level of cervical spinal cord, sequela: Secondary | ICD-10-CM | POA: Insufficient documentation

## 2022-11-06 DIAGNOSIS — X58XXXS Exposure to other specified factors, sequela: Secondary | ICD-10-CM | POA: Insufficient documentation

## 2022-11-06 DIAGNOSIS — G904 Autonomic dysreflexia: Secondary | ICD-10-CM

## 2022-11-06 DIAGNOSIS — Z7901 Long term (current) use of anticoagulants: Secondary | ICD-10-CM | POA: Insufficient documentation

## 2022-11-06 DIAGNOSIS — F418 Other specified anxiety disorders: Secondary | ICD-10-CM

## 2022-11-06 DIAGNOSIS — Z1211 Encounter for screening for malignant neoplasm of colon: Secondary | ICD-10-CM

## 2022-11-06 DIAGNOSIS — R791 Abnormal coagulation profile: Secondary | ICD-10-CM

## 2022-11-06 DIAGNOSIS — G8250 Quadriplegia, unspecified: Secondary | ICD-10-CM | POA: Diagnosis not present

## 2022-11-06 DIAGNOSIS — L97828 Non-pressure chronic ulcer of other part of left lower leg with other specified severity: Secondary | ICD-10-CM | POA: Diagnosis present

## 2022-11-06 DIAGNOSIS — Z0000 Encounter for general adult medical examination without abnormal findings: Secondary | ICD-10-CM | POA: Diagnosis not present

## 2022-11-06 DIAGNOSIS — G894 Chronic pain syndrome: Secondary | ICD-10-CM

## 2022-11-06 MED ORDER — ALPRAZOLAM 0.5 MG PO TABS: ORAL_TABLET | ORAL | 0 refills | Status: DC

## 2022-11-06 MED ORDER — WARFARIN SODIUM 4 MG PO TABS: ORAL_TABLET | ORAL | 5 refills | Status: DC

## 2022-11-06 MED ORDER — ACETAMINOPHEN-CODEINE 300-30 MG PO TABS: 1.0000 | ORAL_TABLET | ORAL | 0 refills | Status: DC | PRN

## 2022-11-06 MED ORDER — WARFARIN SODIUM 2 MG PO TABS: ORAL_TABLET | ORAL | 5 refills | Status: DC
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
## 2022-11-06 NOTE — Patient Instructions (Signed)
Get these vaccines from the pharmacy  -2nd shingles vaccines  -prevnar 20

## 2022-11-06 NOTE — Telephone Encounter (Signed)
Pt called and said she went to her wound care appointment today and she told them that her INR was elevated and they told her that could be a sign of problems with her liver and they recommended that patient have labs to check her liver.  They looked and her previous labs were fine. Will schedule patient a lab visit to have these collected unless instructed to do something different. Pt will call back to schedule when she can find someone to bring her in the mornings.  Timmy Bubeck Zimmerman Rumple, CMA

## 2022-11-06 NOTE — Telephone Encounter (Signed)
Lab order has been placed per Junius Creamer, FNP-C
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
## 2022-11-07 ENCOUNTER — Other Ambulatory Visit: Payer: Medicare Other

## 2022-11-07 DIAGNOSIS — R791 Abnormal coagulation profile: Secondary | ICD-10-CM

## 2022-11-08 ENCOUNTER — Other Ambulatory Visit: Payer: Self-pay | Admitting: Nurse Practitioner

## 2022-11-08 ENCOUNTER — Telehealth: Payer: Self-pay | Admitting: *Deleted

## 2022-11-08 LAB — COMPREHENSIVE METABOLIC PANEL
ALT: 15 IU/L (ref 0–32)
AST: 20 IU/L (ref 0–40)
Albumin/Globulin Ratio: 1 — ABNORMAL LOW (ref 1.2–2.2)
Albumin: 3.7 g/dL — ABNORMAL LOW (ref 3.9–4.9)
Alkaline Phosphatase: 119 IU/L (ref 44–121)
BUN/Creatinine Ratio: 23 (ref 12–28)
BUN: 11 mg/dL (ref 8–27)
Bilirubin Total: 0.3 mg/dL (ref 0.0–1.2)
CO2: 24 mmol/L (ref 20–29)
Calcium: 9.6 mg/dL (ref 8.7–10.3)
Chloride: 102 mmol/L (ref 96–106)
Creatinine, Ser: 0.47 mg/dL — ABNORMAL LOW (ref 0.57–1.00)
Globulin, Total: 3.7 g/dL (ref 1.5–4.5)
Glucose: 104 mg/dL — ABNORMAL HIGH (ref 70–99)
Potassium: 3.6 mmol/L (ref 3.5–5.2)
Sodium: 145 mmol/L — ABNORMAL HIGH (ref 134–144)
Total Protein: 7.4 g/dL (ref 6.0–8.5)
eGFR: 104 mL/min/{1.73_m2} (ref >59)

## 2022-11-08 MED ORDER — ALPRAZOLAM 0.5 MG PO TABS: ORAL_TABLET | ORAL | 0 refills | Status: DC

## 2022-11-08 NOTE — Telephone Encounter (Signed)
Journey from [facility] is calling requesting clarification for patients Rx for Xanex.  They are needing to know the day supply for this.  Should it be 30 tablets for 90 days or 10 tablets for 90 days. She mentioned that the original sent was for 10 for a year.  The contact number for this is [phone] and the Reference# [id number].  Please call or resend with the requested information. Damisha Wolff Zimmerman Rumple, CMA

## 2022-11-08 NOTE — Addendum Note (Signed)
Addended by: Leretha Pol on: 11/08/2022 08:13 AM   Modules accepted: Orders

## 2022-11-08 NOTE — Progress Notes (Signed)
[place], [place] Y ([id number]) [id number] Page 1 of 9 Visit Report for 11/06/2022 Arrival Information Details Patient Name: Date of Service: [name], [name] 11/06/2022 12:30 PM Medical Record Number: [id number] Patient Account Number: [id number] Date of Birth/Sex: Treating RN: 06/28/1956 (66 y.o. [name], [name] Primary Care [name]: [name] Other Clinician: Referring [name]: Treating [name]/Extender: [name] in Treatment: 21 Visit Information History Since Last Visit Added or deleted any medications: No Patient Arrived: Wheel Chair Any new allergies or adverse reactions: No Arrival Time: 12:40 Had a fall or experienced change in No Accompanied By: sister activities of daily living that may affect Transfer Assistance: None risk of falls: Patient Requires Transmission-Based Precautions: No Signs or symptoms of abuse/neglect since last visito No Patient Has Alerts: Yes Hospitalized since last visit: No Patient Alerts: Patient on Blood Thinner Implantable device outside of the clinic excluding No Coumadin cellular tissue based products placed in the center since last visit: Has Dressing in Place as Prescribed: Yes Pain Present Now: No Electronic Signature(s) Signed: 11/07/2022 4:45:09 PM By: Angela Catholic RN Entered By: [name] on 11/06/2022 12:51:33 -------------------------------------------------------------------------------- Encounter Discharge Information Details Patient Name: Date of Service: [name], [place] Y. 11/06/2022 12:30 PM Medical Record Number: [id number] Patient Account Number: [id number] Date of Birth/Sex: Treating RN: [birth year] (66 y.o. [name] Primary Care Maeci Kalbfleisch: [name] Other Clinician: Referring Kalmen Lollar: Treating [name]/Extender: [name] in Treatment: 46 Encounter Discharge Information Items Post Procedure  Vitals Discharge Condition: Stable Temperature (F): 98.8 Ambulatory Status: Wheelchair Pulse (bpm): 80 Discharge Destination: Home Respiratory Rate (breaths/min): 16 Transportation: Private Auto Blood Pressure (mmHg): 118/83 Accompanied By: self Schedule Follow-up Appointment: Yes Clinical Summary of Care: Patient Declined Electronic Signature(s) Signed: 11/06/2022 4:56:57 PM By: [name] Entered By: [name] on 11/06/2022 13:14:21 [place], [place] Y ([id number]) [id number] Page 2 of 9 -------------------------------------------------------------------------------- Lower Extremity Assessment Details Patient Name: Date of Service: [name], [name] 11/06/2022 12:30 PM Medical Record Number: [id number] Patient Account Number: [id number] Date of Birth/Sex: Treating RN: 1956-10-08 (66 y.o. [name] Primary Care Corra Kaine: [name] Other Clinician: Referring Markeia Harkless: Treating [name] Weeks in Treatment: 42 Edema Assessment Assessed: [Left: No] [Right: No] Edema: [Left: Ye] [Right: s] Calf Left: Right: Point of Measurement: 31 cm From Medial Instep 28.5 cm Ankle Left: Right: Point of Measurement: 7 cm From Medial Instep 22.1 cm Vascular Assessment Pulses: Dorsalis Pedis Palpable: [Left:Yes] Electronic Signature(s) Signed: 11/07/2022 4:45:09 PM By: Angela Catholic RN Entered By: [name] on 11/06/2022 12:53:48 -------------------------------------------------------------------------------- Multi Wound Chart Details Patient Name: Date of Service: [name], [place] Y. 11/06/2022 12:30 PM Medical Record Number: [id number] Patient Account Number: [id number] Date of Birth/Sex: Treating RN: 02-Nov-1956 (66 y.o. F) Primary Care Tavonte Seybold: [name] Other Clinician: Referring Newell Frater: Treating Anevay Campanella/Extender: [name] in Treatment:  42 Vital Signs Height(in): 64 Pulse(bpm): 80 Weight(lbs): 175 Blood Pressure(mmHg): 118/83 Body Mass Index(BMI): 30 Temperature(F): 98.8 Respiratory Rate(breaths/min): 16 [22:Photos:] [N/A:N/A [id number] Page 3 of 9] Left Calcaneus Left, Posterior Ankle N/A Wound Location: Pressure Injury Pressure Injury N/A Wounding Event: Pressure Ulcer Pressure Ulcer N/A Primary Etiology: Cataracts, Hypotension, Neuropathy, Cataracts, Hypotension, Neuropathy, N/A Comorbid History: Quadriplegia Quadriplegia 11/03/2021 11/03/2021 N/A Date Acquired: 25 42 N/A Weeks of Treatment: Open Open N/A Wound Status: No No N/A Wound Recurrence: 1.6x2.5x0.1 1.4x1.4x0.4 N/A Measurements L x W x D (cm) 3.142 1.539 N/A A (cm) : rea 0.314 0.616 N/A Volume (cm) : 50.00%  63.70% N/A % Reduction in A rea: 83.30% 63.70% N/A % Reduction in Volume: Category/Stage III Category/Stage IV N/A Classification: Medium Medium N/A Exudate A mount: Serosanguineous Serosanguineous N/A Exudate Type: red, Walker red, Walker N/A Exudate Color: Distinct, outline attached Flat and Intact N/A Wound Margin: Large (67-100%) Medium (34-66%) N/A Granulation A mount: Red Red N/A Granulation Quality: Small (1-33%) Medium (34-66%) N/A Necrotic A mount: Adherent Slough Eschar, Adherent Slough N/A Necrotic Tissue: Fat Layer (Subcutaneous Tissue): Yes Fat Layer (Subcutaneous Tissue): Yes N/A Exposed Structures: Fascia: No Tendon: Yes Tendon: No Fascia: No Muscle: No Muscle: No Joint: No Joint: No Bone: No Bone: No Small (1-33%) None N/A Epithelialization: Debridement - Excisional Debridement - Excisional N/A Debridement: Pre-procedure Verification/Time Out 13:09 13:09 N/A Taken: Lidocaine 4% Topical Solution Lidocaine 4% Topical Solution N/A Pain Control: Subcutaneous, Slough Tendon, Subcutaneous, Slough N/A Tissue Debrided: Skin/Subcutaneous Tissue Skin/Subcutaneous  Tissue/Muscle N/A Level: 4 1.96 N/A Debridement A (sq cm): rea Curette Curette N/A Instrument: Minimum Minimum N/A Bleeding: Pressure Pressure N/A Hemostasis A chieved: Procedure was tolerated well Procedure was tolerated well N/A Debridement Treatment Response: 1.6x2.5x0.1 1.4x1.4x0.4 N/A Post Debridement Measurements L x W x D (cm) 0.314 0.616 N/A Post Debridement Volume: (cm) Category/Stage III Category/Stage IV N/A Post Debridement Stage: No Abnormalities Noted No Abnormalities Noted N/A Periwound Skin Texture: No Abnormalities Noted No Abnormalities Noted N/A Periwound Skin Moisture: No Abnormalities Noted No Abnormalities Noted N/A Periwound Skin Color: No Abnormality No Abnormality N/A Temperature: Debridement Debridement N/A Procedures Performed: Treatment Notes Wound #22 (Calcaneus) Wound Laterality: Left Cleanser Soap and Water Discharge Instruction: May shower and wash wound with dial antibacterial soap and water prior to dressing change. Wound Cleanser Discharge Instruction: Cleanse the wound with wound cleanser prior to applying a clean dressing using gauze sponges, not tissue or cotton balls. Peri-Wound Care Topical Skintegrity Hydrogel 4 (oz) Discharge Instruction: Apply hydrogel thin layer over collagen Primary Dressing Promogran Prisma Matrix, 4.34 (sq in) (silver collagen) Discharge Instruction: Moisten collagen with saline or hydrogel Secondary Dressing Woven Gauze Sponges 2x2 in Discharge Instruction: moisten with saline and Apply over santyl to fill the wound Zetuvit Plus Silicone Border Dressing 7x7(in/in) Discharge Instruction: Apply silicone border over primary dressing as directed. Secured With 74M Medipore H Soft Cloth Surgical T ape, 4 x 10 (in/yd) Discharge Instruction: Secure with tape as needed SHAYDA, KALKA ([id number]) [id number] Page 4 of 9 Compression Wrap Compression Stockings Add-Ons Wound #23  (Ankle) Wound Laterality: Left, Posterior Cleanser Soap and Water Discharge Instruction: May shower and wash wound with dial antibacterial soap and water prior to dressing change. Wound Cleanser Discharge Instruction: Cleanse the wound with wound cleanser prior to applying a clean dressing using gauze sponges, not tissue or cotton balls. Peri-Wound Care Topical Skintegrity Hydrogel 4 (oz) Discharge Instruction: Apply hydrogel thin layer over collagen Primary Dressing Promogran Prisma Matrix, 4.34 (sq in) (silver collagen) Discharge Instruction: Moisten collagen with saline or hydrogel Secondary Dressing Woven Gauze Sponges 2x2 in Discharge Instruction: moisten with saline and Apply over santyl to fill the wound Zetuvit Plus Silicone Border Dressing 7x7(in/in) Discharge Instruction: Apply silicone border over primary dressing as directed. Secured With 74M Medipore H Soft Cloth Surgical T ape, 4 x 10 (in/yd) Discharge Instruction: Secure with tape as needed Compression Wrap Compression Stockings Add-Ons Electronic Signature(s) Signed: 11/06/2022 1:26:24 PM By: Fredirick Maudlin MD FACS Entered By: Fredirick Maudlin on 11/06/2022 13:26:23 -------------------------------------------------------------------------------- Multi-Disciplinary Care Plan Details Patient Name: Date of Service: [name], [place] Y. 11/06/2022 12:30 PM Medical Record  Number: [id number] Patient Account Number: [id number] Date of Birth/Sex: Treating RN: 10/27/1956 (66 y.o. [name] Primary Care Marylyn Appenzeller: [name] Other Clinician: Referring Mister Krahenbuhl: Treating Seanpaul Preece/Extender: [name] in Treatment: [place] reviewed with physician Active Inactive Pressure Nursing Diagnoses: Knowledge deficit related to causes and risk factors for pressure ulcer development Knowledge deficit related to management of pressures ulcers Potential for impaired  tissue integrity related to pressure, friction, moisture, and shear ARNELL, MAUSOLF ([id number]) [id number] Page 5 of 9 Goals: Patient/caregiver will verbalize understanding of pressure ulcer management Date Initiated: 03/20/2022 Target Resolution Date: 12/01/2022 Goal Status: Active Interventions: Assess: immobility, friction, shearing, incontinence upon admission and as needed Assess offloading mechanisms upon admission and as needed Assess potential for pressure ulcer upon admission and as needed Notes: Wound/Skin Impairment Nursing Diagnoses: Impaired tissue integrity Knowledge deficit related to smoking impact on wound healing Goals: Patient/caregiver will verbalize understanding of skin care regimen Date Initiated: 01/12/2022 Target Resolution Date: 12/01/2022 Goal Status: Active Interventions: Assess patient/caregiver ability to obtain necessary supplies Assess patient/caregiver ability to perform ulcer/skin care regimen upon admission and as needed Assess ulceration(s) every visit Provide education on smoking Provide education on ulcer and skin care Notes: Electronic Signature(s) Signed: 11/06/2022 4:56:57 PM By: [name] Entered By: [name] on 11/06/2022 13:13:05 -------------------------------------------------------------------------------- Pain Assessment Details Patient Name: Date of Service: [name], [place] Y. 11/06/2022 12:30 PM Medical Record Number: [id number] Patient Account Number: [id number] Date of Birth/Sex: Treating RN: Oct 10, 1956 (66 y.o. [name] Primary Care Zayon Trulson: [name] Other Clinician: Referring Breon Rehm: Treating Marvens Hollars/Extender: [name] in Treatment: 47 Active Problems Location of Pain Severity and Description of Pain Patient Has Paino No Site Locations [place], [place] ([id number]) [id number] Page 6 of 9 Pain  Management and Medication Current Pain Management: Electronic Signature(s) Signed: 11/07/2022 4:45:09 PM By: Angela Catholic RN Entered By: [name] on 11/06/2022 12:52:05 -------------------------------------------------------------------------------- Patient/Caregiver Education Details Patient Name: Date of Service: [name], [place] Cheryln Manly 12/4/2023andnbsp12:30 PM Medical Record Number: [id number] Patient Account Number: [id number] Date of Birth/Gender: Treating RN: 08/07/1956 (66 y.o. [name] Primary Care Physician: [name] Other Clinician: Referring Physician: Treating Physician/Extender: [name] in Treatment: 40 Education Assessment Education Provided To: Patient Education Topics Provided Wound/Skin Impairment: Methods: Explain/Verbal Responses: Reinforcements needed, State content correctly Electronic Signature(s) Signed: 11/06/2022 4:56:57 PM By: [name] Entered By: [name] on 11/06/2022 13:13:18 -------------------------------------------------------------------------------- Wound Assessment Details Patient Name: Date of Service: [name], [place] Y. 11/06/2022 12:30 PM Medical Record Number: [id number] Patient Account Number: [id number] Date of Birth/Sex: Treating RN: Jan 01, 1956 (66 y.o. [name] Primary Care Kashayla Ungerer: [name] Other Clinician: Referring Dyer Klug: Treating Solaris Kram/Extender: [name] Weeks in Treatment: 42 Wound Status Wound Number: 22 Primary Etiology: Pressure Ulcer Wound Location: Left Calcaneus Wound Status: Open Wounding Event: Pressure Injury Comorbid History: Cataracts, Hypotension, Neuropathy, Quadriplegia Date Acquired: 11/03/2021 Weeks Of Treatment: 42 Clustered Wound: No Photos [place], [place] Y ([id number]) [id number] Page 7 of 9 Wound Measurements Length: (cm) 1.6 Width: (cm) 2.5 Depth: (cm)  0.1 Area: (cm) 3.142 Volume: (cm) 0.314 % Reduction in Area: 50% % Reduction in Volume: 83.3% Epithelialization: Small (1-33%) Tunneling: No Undermining: No Wound Description Classification: Category/Stage III Wound Margin: Distinct, outline attached Exudate Amount: Medium Exudate Type: Serosanguineous Exudate Color: red, Walker Foul Odor After Cleansing: No Slough/Fibrino Yes Wound Bed Granulation Amount: Large (67-100%) Exposed Structure Granulation Quality: Red Fascia Exposed: No Necrotic Amount: Small (  1-33%) Fat Layer (Subcutaneous Tissue) Exposed: Yes Necrotic Quality: Adherent Slough Tendon Exposed: No Muscle Exposed: No Joint Exposed: No Bone Exposed: No Periwound Skin Texture Texture Color No Abnormalities Noted: Yes No Abnormalities Noted: Yes Moisture Temperature / Pain No Abnormalities Noted: Yes Temperature: No Abnormality Electronic Signature(s) Signed: 11/07/2022 4:45:09 PM By: Angela Catholic RN Entered By: [name] on 11/06/2022 13:02:39 -------------------------------------------------------------------------------- Wound Assessment Details Patient Name: Date of Service: [name], [place] Y. 11/06/2022 12:30 PM Medical Record Number: [id number] Patient Account Number: [id number] Date of Birth/Sex: Treating RN: Apr 11, 1956 (66 y.o. [name] Primary Care Avis Mcmahill: [name] Other Clinician: Referring Riann Oman: Treating Panzy Bubeck/Extender: [name] Weeks in Treatment: 42 Wound Status Wound Number: 23 Primary Etiology: Pressure Ulcer Wound Location: Left, Posterior Ankle Wound Status: Open Wounding Event: Pressure Injury Comorbid History: Cataracts, Hypotension, Neuropathy, Quadriplegia Date Acquired: 11/03/2021 Weeks Of Treatment: 42 Clustered Wound: No Photos [place], [place] Y ([id number]) [id number] Page 8 of 9 Wound Measurements Length: (cm) 1.4 Width: (cm) 1.4 Depth:  (cm) 0.4 Area: (cm) 1.539 Volume: (cm) 0.616 % Reduction in Area: 63.7% % Reduction in Volume: 63.7% Epithelialization: None Tunneling: No Undermining: No Wound Description Classification: Category/Stage IV Wound Margin: Flat and Intact Exudate Amount: Medium Exudate Type: Serosanguineous Exudate Color: red, Walker Foul Odor After Cleansing: No Slough/Fibrino Yes Wound Bed Granulation Amount: Medium (34-66%) Exposed Structure Granulation Quality: Red Fascia Exposed: No Necrotic Amount: Medium (34-66%) Fat Layer (Subcutaneous Tissue) Exposed: Yes Necrotic Quality: Eschar, Adherent Slough Tendon Exposed: Yes Muscle Exposed: No Joint Exposed: No Bone Exposed: No Periwound Skin Texture Texture Color No Abnormalities Noted: Yes No Abnormalities Noted: Yes Moisture Temperature / Pain No Abnormalities Noted: Yes Temperature: No Abnormality Treatment Notes Wound #23 (Ankle) Wound Laterality: Left, Posterior Cleanser Soap and Water Discharge Instruction: May shower and wash wound with dial antibacterial soap and water prior to dressing change. Wound Cleanser Discharge Instruction: Cleanse the wound with wound cleanser prior to applying a clean dressing using gauze sponges, not tissue or cotton balls. Peri-Wound Care Topical Skintegrity Hydrogel 4 (oz) Discharge Instruction: Apply hydrogel thin layer over collagen Primary Dressing Promogran Prisma Matrix, 4.34 (sq in) (silver collagen) Discharge Instruction: Moisten collagen with saline or hydrogel Secondary Dressing Woven Gauze Sponges 2x2 in Discharge Instruction: moisten with saline and Apply over santyl to fill the wound Zetuvit Plus Silicone Border Dressing 7x7(in/in) Discharge Instruction: Apply silicone border over primary dressing as directed. Secured With 41M Medipore H Soft Cloth Surgical T ape, 4 x 10 (in/yd) Discharge Instruction: Secure with tape as needed Compression TRENISHA, LAFAVOR ([id number])  [id number] Page 9 of 9 Compression Stockings Add-Ons Electronic Signature(s) Signed: 11/07/2022 4:45:09 PM By: Angela Catholic RN Entered By: [name] on 11/06/2022 13:03:30 -------------------------------------------------------------------------------- Vitals Details Patient Name: Date of Service: [name], [place] Y. 11/06/2022 12:30 PM Medical Record Number: [id number] Patient Account Number: [id number] Date of Birth/Sex: Treating RN: 10/30/1956 (66 y.o. [name] Primary Care Fontaine Hehl: [name] Other Clinician: Referring Arella Blinder: Treating Raiford Fetterman/Extender: [name] in Treatment: 35 Vital Signs Time Taken: 12:42 Temperature (F): 98.8 Height (in): 64 Pulse (bpm): 80 Weight (lbs): 175 Respiratory Rate (breaths/min): 16 Body Mass Index (BMI): 30 Blood Pressure (mmHg): 118/83 Reference Range: 80 - 120 mg / dl Electronic Signature(s) Signed: 11/07/2022 4:45:09 PM By: Angela Catholic RN Entered By: [name] on 11/06/2022 12:51:58

## 2022-11-08 NOTE — Telephone Encounter (Signed)
Hey. I sent them a new prescription this morning. This is for 30 tablets per 90 days. I was actually trying to make this easier, not more difficult.

## 2022-11-08 NOTE — Telephone Encounter (Signed)
I called and spoke to pharmacist and gave them the below information and she was going to process the Rx. Abri Vacca Zimmerman Rumple, CMA

## 2022-11-23 ENCOUNTER — Other Ambulatory Visit: Payer: Self-pay

## 2022-11-23 LAB — COLOGUARD: COLOGUARD: POSITIVE — AB

## 2022-11-23 MED ORDER — PRAVASTATIN SODIUM 40 MG PO TABS: 40.0000 mg | ORAL_TABLET | Freq: Every day | ORAL | 0 refills | Status: DC

## 2022-11-23 MED ORDER — OXYBUTYNIN CHLORIDE 5 MG PO TABS: 5.0000 mg | ORAL_TABLET | Freq: Two times a day (BID) | ORAL | 0 refills | Status: DC

## 2022-11-23 MED ORDER — BUMETANIDE 1 MG PO TABS: 1.0000 mg | ORAL_TABLET | Freq: Every day | ORAL | 0 refills | Status: DC

## 2022-11-23 NOTE — Progress Notes (Signed)
Please let the patient know that her cologuard test was positive. I would like to refer her to GI for screening colonoscopy. If she is agreeable, I will send referral to [facility].  Thanks  -HB

## 2022-11-24 ENCOUNTER — Other Ambulatory Visit: Payer: Self-pay | Admitting: Nurse Practitioner

## 2022-11-24 DIAGNOSIS — Z1211 Encounter for screening for malignant neoplasm of colon: Secondary | ICD-10-CM

## 2022-11-24 NOTE — Progress Notes (Signed)
Hi. Please let the patient know that I placed referral to [facility] for screening colonoscopy. Also, CMP looks good. The sodium at 145 is ok at 145 and the protein is still low, but improving. If she was not fasting, the glucose at 104 is good.  Thanks so much.   -HB

## 2022-11-24 NOTE — Progress Notes (Signed)
CMP looks good. The sodium at 145 is ok at 145 and the protein is still low, but improving. If she was not fasting, the glucose at 104 is good.

## 2022-11-28 ENCOUNTER — Other Ambulatory Visit: Payer: Self-pay | Admitting: *Deleted

## 2022-11-28 DIAGNOSIS — G8250 Quadriplegia, unspecified: Secondary | ICD-10-CM

## 2022-11-28 MED ORDER — DIAZEPAM 5 MG PO TABS: 5.0000 mg | ORAL_TABLET | Freq: Two times a day (BID) | ORAL | 0 refills | Status: DC | PRN

## 2022-11-28 MED ORDER — DIAZEPAM 5 MG PO TABS: 5.0000 mg | ORAL_TABLET | Freq: Two times a day (BID) | ORAL | 1 refills | Status: DC | PRN

## 2022-11-28 NOTE — Addendum Note (Signed)
Addended by: Gemma Payor on: 11/28/2022 02:15 PM   Modules accepted: Orders

## 2022-11-28 NOTE — Addendum Note (Signed)
Addended by: Gemma Payor on: 11/28/2022 02:14 PM   Modules accepted: Orders

## 2022-11-28 NOTE — Telephone Encounter (Signed)
Contacted patient to give her the information about her referral to GI for screening colonoscopy.  She also request a refill on the below medication.Karyna Bessler Zimmerman Rumple, CMA   diazepam (VALIUM) 5 MG tablet      [place], [place]   LOV:11/06/22 ROV:03/12/23

## 2022-12-05 LAB — POCT INR: INR: 3.1 — AB (ref 0.80–1.20)

## 2022-12-11 ENCOUNTER — Encounter (HOSPITAL_BASED_OUTPATIENT_CLINIC_OR_DEPARTMENT_OTHER): Payer: HMO | Attending: General Surgery | Admitting: General Surgery

## 2022-12-11 DIAGNOSIS — G8250 Quadriplegia, unspecified: Secondary | ICD-10-CM | POA: Diagnosis not present

## 2022-12-11 DIAGNOSIS — E039 Hypothyroidism, unspecified: Secondary | ICD-10-CM | POA: Diagnosis not present

## 2022-12-11 DIAGNOSIS — L89524 Pressure ulcer of left ankle, stage 4: Secondary | ICD-10-CM | POA: Diagnosis not present

## 2022-12-11 DIAGNOSIS — L97828 Non-pressure chronic ulcer of other part of left lower leg with other specified severity: Secondary | ICD-10-CM | POA: Diagnosis not present

## 2022-12-11 DIAGNOSIS — S14109S Unspecified injury at unspecified level of cervical spinal cord, sequela: Secondary | ICD-10-CM | POA: Insufficient documentation

## 2022-12-11 DIAGNOSIS — Z993 Dependence on wheelchair: Secondary | ICD-10-CM | POA: Insufficient documentation

## 2022-12-11 DIAGNOSIS — M810 Age-related osteoporosis without current pathological fracture: Secondary | ICD-10-CM | POA: Insufficient documentation

## 2022-12-11 DIAGNOSIS — Z7901 Long term (current) use of anticoagulants: Secondary | ICD-10-CM | POA: Insufficient documentation

## 2022-12-11 DIAGNOSIS — L89623 Pressure ulcer of left heel, stage 3: Secondary | ICD-10-CM | POA: Diagnosis not present

## 2022-12-11 DIAGNOSIS — L97821 Non-pressure chronic ulcer of other part of left lower leg limited to breakdown of skin: Secondary | ICD-10-CM | POA: Diagnosis not present

## 2022-12-12 DIAGNOSIS — L89623 Pressure ulcer of left heel, stage 3: Secondary | ICD-10-CM | POA: Diagnosis not present
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
## 2022-12-12 NOTE — Progress Notes (Addendum)
[place], [place] ([id number]) [id number] Page 1 of 9 Visit Report for 12/11/2022 Arrival Information Details Patient Name: Date of Service: [place], [name] 12/11/2022 12:30 PM Medical Record Number: [id number] Patient Account Number: [id number] Date of Birth/Sex: Treating RN: [birth year] (67 y.o. Donalda Ewings Primary Care Niema Carrara: Milus Mallick, MA RITZA Other Clinician: Referring Danese Dorsainvil: Treating Alfhild Partch/Extender: Terese Door, MA RITZA Weeks in Treatment: 33 Visit Information History Since Last Visit Added or deleted any medications: No Patient Arrived: Wheel Chair Any new allergies or adverse reactions: No Arrival Time: 12:56 Had a fall or experienced change in No Accompanied By: self activities of daily living that may affect Transfer Assistance: None risk of falls: Patient Identification Verified: Yes Signs or symptoms of abuse/neglect since last visito No Secondary Verification Process Completed: Yes Hospitalized since last visit: No Patient Requires Transmission-Based Precautions: No Implantable device outside of the clinic excluding No Patient Has Alerts: Yes cellular tissue based products placed in the center Patient Alerts: Patient on Blood Thinner since last visit: Coumadin Has Dressing in Place as Prescribed: Yes Pain Present Now: No Electronic Signature(s) Signed: 12/11/2022 5:16:32 PM By: Sharyn Creamer RN, BSN Entered By: Sharyn Creamer on 12/11/2022 12:57:14 -------------------------------------------------------------------------------- Encounter Discharge Information Details Patient Name: Date of Service: [name], [place] Y. 12/11/2022 12:30 PM Medical Record Number: [id number] Patient Account Number: [id number] Date of Birth/Sex: Treating RN: Dec 07, 1955 (67 y.o. F) Zochol, Jamie Primary Care Naseer Hearn: Milus Mallick, MA RITZA Other Clinician: Referring Ashlen Kiger: Treating Coty Student/Extender: Terese Door, MA  RITZA Weeks in Treatment: 7 Encounter Discharge Information Items Post Procedure Vitals Discharge Condition: Stable Temperature (F): 97.8 Ambulatory Status: Wheelchair Pulse (bpm): 58 Discharge Destination: Home Respiratory Rate (breaths/min): 16 Transportation: Private Auto Blood Pressure (mmHg): 119/68 Accompanied By: self Schedule Follow-up Appointment: Yes Clinical Summary of Care: Electronic Signature(s) Signed: 12/11/2022 5:17:35 PM By: Blanche East RN Entered By: Blanche East on 12/11/2022 17:17:35 [place], [place] Y ([id number]) [id number] Page 2 of 9 -------------------------------------------------------------------------------- Lower Extremity Assessment Details Patient Name: Date of Service: [name], [name] 12/11/2022 12:30 PM Medical Record Number: [id number] Patient Account Number: [id number] Date of Birth/Sex: Treating RN: 07/05/1956 (66 y.o. Donalda Ewings Primary Care Olson Lucarelli: Milus Mallick, MA RITZA Other Clinician: Referring Wilmary Levit: Treating Takira Sherrin/Extender: Terese Door, MA RITZA Weeks in Treatment: 47 Edema Assessment Assessed: [Left: No] [Right: No] Edema: [Left: Ye] [Right: s] Calf Left: Right: Point of Measurement: 31 cm From Medial Instep 30.5 cm Ankle Left: Right: Point of Measurement: 7 cm From Medial Instep 21 cm Vascular Assessment Pulses: Dorsalis Pedis Palpable: [Left:Yes] Electronic Signature(s) Signed: 12/11/2022 5:16:32 PM By: Sharyn Creamer RN, BSN Entered By: Sharyn Creamer on 12/11/2022 13:02:55 -------------------------------------------------------------------------------- Multi Wound Chart Details Patient Name: Date of Service: [name], [name] 12/11/2022 12:30 PM Medical Record Number: [id number] Patient Account Number: [id number] Date of Birth/Sex: Treating RN: 07/21/1956 (67 y.o. F) Primary Care Ellenore [place]: Milus Mallick, MA RITZA Other Clinician: Referring Shandora Koogler: Treating  Lelia Jons/Extender: Terese Door, MA RITZA Weeks in Treatment: 47 Vital Signs Height(in): 64 Pulse(bpm): 58 Weight(lbs): 175 Blood Pressure(mmHg): 119/68 Body Mass Index(BMI): 30 Temperature(F): 97.8 Respiratory Rate(breaths/min): 16 [22:Photos:] [[id number] Page 3 of 9] Left Calcaneus Left, Posterior Ankle Left, Anterior Lower Leg Wound Location: Pressure Injury Pressure Injury Blister Wounding Event: Pressure Ulcer Pressure Ulcer Pressure Ulcer Primary Etiology: Cataracts, Hypotension, Neuropathy, Cataracts, Hypotension, Neuropathy, Cataracts, Hypotension, Neuropathy, Comorbid History: Quadriplegia Quadriplegia Quadriplegia 11/03/2021 11/03/2021 11/22/2022 Date Acquired:  47 47 0 Weeks of Treatment: Open Open Open Wound Status: No No No Wound Recurrence: 1.8x1.7x0.1 0.8x1x0.1 1.1x1x0.1 Measurements L x W x D (cm) 2.403 0.628 0.864 A (cm) : rea 0.24 0.063 0.086 Volume (cm) : 61.80% 85.20% N/A % Reduction in A rea: 87.30% 96.30% N/A % Reduction in Volume: Category/Stage III Category/Stage IV Category/Stage II Classification: Medium Medium Medium Exudate A mount: Serosanguineous Serosanguineous Serosanguineous Exudate Type: red, brown red, brown red, brown Exudate Color: Distinct, outline attached Flat and Intact N/A Wound Margin: Large (67-100%) Medium (34-66%) Large (67-100%) Granulation A mount: Red Red Red Granulation Quality: Small (1-33%) Medium (34-66%) None Present (0%) Necrotic A mount: Adherent Slough Eschar, Adherent Slough N/A Necrotic Tissue: Fat Layer (Subcutaneous Tissue): Yes Fat Layer (Subcutaneous Tissue): Yes Fat Layer (Subcutaneous Tissue): Yes Exposed Structures: Fascia: No Tendon: Yes Tendon: No Fascia: No Muscle: No Muscle: No Joint: No Joint: No Bone: No Bone: No Small (1-33%) None None Epithelialization: Debridement - Excisional Debridement - Excisional  N/A Debridement: Pre-procedure Verification/Time Out 13:17 13:17 N/A Taken: Subcutaneous, Slough Tendon, Subcutaneous, Slough N/A Tissue Debrided: Skin/Subcutaneous Tissue Skin/Subcutaneous Tissue/Muscle N/A Level: 3.06 0.8 N/A Debridement A (sq cm): rea Curette Curette, Forceps, Scissors N/A Instrument: Minimum Minimum N/A Bleeding: Pressure Pressure N/A Hemostasis A chieved: Procedure was tolerated well Procedure was tolerated well N/A Debridement Treatment Response: 1.8x1.7x0.1 0.8x1x0.1 N/A Post Debridement Measurements L x W x D (cm) 0.24 0.063 N/A Post Debridement Volume: (cm) Category/Stage III Category/Stage IV N/A Post Debridement Stage: No Abnormalities Noted No Abnormalities Noted No Abnormalities Noted Periwound Skin Texture: No Abnormalities Noted No Abnormalities Noted No Abnormalities Noted Periwound Skin Moisture: No Abnormalities Noted No Abnormalities Noted No Abnormalities Noted Periwound Skin Color: No Abnormality No Abnormality No Abnormality Temperature: Debridement Debridement N/A Procedures Performed: Treatment Notes Electronic Signature(s) Signed: 12/11/2022 1:24:01 PM By: Fredirick Maudlin MD FACS Entered By: Fredirick Maudlin on 12/11/2022 13:24:01 -------------------------------------------------------------------------------- Pain Assessment Details Patient Name: Date of Service: [name], [name] 12/11/2022 12:30 PM Medical Record Number: [id number] Patient Account Number: [id number] Date of Birth/Sex: Treating RN: 1955/12/27 (67 y.o. Donalda Ewings Primary Care Nakea Gouger: Milus Mallick, MA RITZA Other Clinician: Referring Indigo Barbian: Treating Dasan Hardman/Extender: Terese Door, MA RITZA Weeks in Treatment: 8 Active Problems Location of Pain Severity and Description of Pain Patient Has Paino No Site Locations [place], [address] ([id number]) [phone].pdf Page 4 of 9 Pain Management and Medication Current  Pain Management: Electronic Signature(s) Signed: 12/11/2022 5:16:32 PM By: Sharyn Creamer RN, BSN Entered By: Sharyn Creamer on 12/11/2022 12:59:56 -------------------------------------------------------------------------------- Wound Assessment Details Patient Name: Date of Service: [name], [place] Y. 12/11/2022 12:30 PM Medical Record Number: [id number] Patient Account Number: [id number] Date of Birth/Sex: Treating RN: 06/14/1956 (67 y.o. Donalda Ewings Primary Care Marshawn Ninneman: Milus Mallick, MA RITZA Other Clinician: Referring Alera Quevedo: Treating Anchor Dwan/Extender: Terese Door, MA RITZA Weeks in Treatment: 47 Wound Status Wound Number: 22 Primary Etiology: Pressure Ulcer Wound Location: Left Calcaneus Wound Status: Open Wounding Event: Pressure Injury Comorbid History: Cataracts, Hypotension, Neuropathy, Quadriplegia Date Acquired: 11/03/2021 Weeks Of Treatment: 47 Clustered Wound: No Photos Wound Measurements Length: (cm) 1.8 Width: (cm) 1.7 Depth: (cm) 0.1 Area: (cm) 2.403 Volume: (cm) 0.24 % Reduction in Area: 61.8% % Reduction in Volume: 87.3% Epithelialization: Small (1-33%) Tunneling: No Undermining: No Wound Description Classification: Category/Stage III HEDWIG, MCFALL ([id number]) Wound Margin: Distinct, outline attache Exudate Amount: Medium Exudate Type: Serosanguineous Exudate Color: red, brown Foul Odor After Cleansing: No [id number] Page 5  of 9 d Slough/Fibrino Yes Wound Bed Granulation Amount: Large (67-100%) Exposed Structure Granulation Quality: Red Fascia Exposed: No Necrotic Amount: Small (1-33%) Fat Layer (Subcutaneous Tissue) Exposed: Yes Necrotic Quality: Adherent Slough Tendon Exposed: No Muscle Exposed: No Joint Exposed: No Bone Exposed: No Periwound Skin Texture Texture Color No Abnormalities Noted: Yes No Abnormalities Noted: Yes Moisture Temperature / Pain No Abnormalities Noted:  Yes Temperature: No Abnormality Treatment Notes Wound #22 (Calcaneus) Wound Laterality: Left Cleanser Soap and Water Discharge Instruction: May shower and wash wound with dial antibacterial soap and water prior to dressing change. Wound Cleanser Discharge Instruction: Cleanse the wound with wound cleanser prior to applying a clean dressing using gauze sponges, not tissue or cotton balls. Peri-Wound Care Topical Skintegrity Hydrogel 4 (oz) Discharge Instruction: Apply hydrogel thin layer over collagen Primary Dressing Endoform 2x2 in Discharge Instruction: Moisten with saline Secondary Dressing Woven Gauze Sponges 2x2 in Discharge Instruction: moisten with saline and Apply over santyl to fill the wound Zetuvit Plus Silicone Border Dressing 7x7(in/in) Discharge Instruction: Apply silicone border over primary dressing as directed. Secured With 10M Medipore H Soft Cloth Surgical T ape, 4 x 10 (in/yd) Discharge Instruction: Secure with tape as needed Compression Wrap Compression Stockings Add-Ons Electronic Signature(s) Signed: 12/11/2022 5:16:32 PM By: Sharyn Creamer RN, BSN Entered By: Sharyn Creamer on 12/11/2022 13:09:34 -------------------------------------------------------------------------------- Wound Assessment Details Patient Name: Date of Service: [name], [place] Y. 12/11/2022 12:30 PM Medical Record Number: [id number] Patient Account Number: [id number] Date of Birth/Sex: Treating RN: [birth year] (68 y.o. Donalda Ewings Primary Care Taleya Whitcher: Milus Mallick, MA RITZA Other Clinician: JACKLYNE, BAIK ([id number]) [id number] Page 6 of 9 Referring Bion Todorov: Treating Adarrius Graeff/Extender: Terese Door, MA RITZA Weeks in Treatment: 47 Wound Status Wound Number: 23 Primary Etiology: Pressure Ulcer Wound Location: Left, Posterior Ankle Wound Status: Open Wounding Event: Pressure Injury Comorbid History: Cataracts, Hypotension, Neuropathy,  Quadriplegia Date Acquired: 11/03/2021 Weeks Of Treatment: 47 Clustered Wound: No Photos Wound Measurements Length: (cm) 0.8 Width: (cm) 1 Depth: (cm) 0.1 Area: (cm) 0.628 Volume: (cm) 0.063 % Reduction in Area: 85.2% % Reduction in Volume: 96.3% Epithelialization: None Tunneling: No Undermining: No Wound Description Classification: Category/Stage IV Wound Margin: Flat and Intact Exudate Amount: Medium Exudate Type: Serosanguineous Exudate Color: red, brown Foul Odor After Cleansing: No Slough/Fibrino Yes Wound Bed Granulation Amount: Medium (34-66%) Exposed Structure Granulation Quality: Red Fascia Exposed: No Necrotic Amount: Medium (34-66%) Fat Layer (Subcutaneous Tissue) Exposed: Yes Necrotic Quality: Eschar, Adherent Slough Tendon Exposed: Yes Muscle Exposed: No Joint Exposed: No Bone Exposed: No Periwound Skin Texture Texture Color No Abnormalities Noted: Yes No Abnormalities Noted: Yes Moisture Temperature / Pain No Abnormalities Noted: Yes Temperature: No Abnormality Treatment Notes Wound #23 (Ankle) Wound Laterality: Left, Posterior Cleanser Soap and Water Discharge Instruction: May shower and wash wound with dial antibacterial soap and water prior to dressing change. Wound Cleanser Discharge Instruction: Cleanse the wound with wound cleanser prior to applying a clean dressing using gauze sponges, not tissue or cotton balls. Peri-Wound Care Topical Skintegrity Hydrogel 4 (oz) Discharge Instruction: Apply hydrogel thin layer over collagen Primary Dressing Endoform 2x2 in Discharge Instruction: Moisten with saline [place] ([id number]) [id number] Page 7 of 9 Secondary Dressing Woven Gauze Sponges 2x2 in Discharge Instruction: moisten with saline and Apply over santyl to fill the wound Zetuvit Plus Silicone Border Dressing 7x7(in/in) Discharge Instruction: Apply silicone border over primary dressing as  directed. Secured With 10M Medipore H Soft Cloth Surgical T ape, 4 x 10 (  in/yd) Discharge Instruction: Secure with tape as needed Compression Wrap Compression Stockings Add-Ons Electronic Signature(s) Signed: 12/11/2022 5:16:32 PM By: Sharyn Creamer RN, BSN Entered By: Sharyn Creamer on 12/11/2022 13:10:02 -------------------------------------------------------------------------------- Wound Assessment Details Patient Name: Date of Service: [name], [place] Y. 12/11/2022 12:30 PM Medical Record Number: [id number] Patient Account Number: [id number] Date of Birth/Sex: Treating RN: 1956/05/20 (67 y.o. Donalda Ewings Primary Care Tonee Silverstein: Milus Mallick, MA RITZA Other Clinician: Referring Gasper Hopes: Treating Tajee Savant/Extender: Terese Door, MA RITZA Weeks in Treatment: 47 Wound Status Wound Number: 24 Primary Etiology: Pressure Ulcer Wound Location: Left, Anterior Lower Leg Wound Status: Open Wounding Event: Blister Comorbid History: Cataracts, Hypotension, Neuropathy, Quadriplegia Date Acquired: 11/22/2022 Weeks Of Treatment: 0 Clustered Wound: No Photos Wound Measurements Length: (cm) 1.1 Width: (cm) 1 Depth: (cm) 0.1 Area: (cm) 0.864 Volume: (cm) 0.086 % Reduction in Area: % Reduction in Volume: Epithelialization: None Tunneling: No Undermining: No Wound Description Classification: Category/Stage II Exudate Amount: Medium Exudate Type: Serosanguineous Exudate Color: red, brown Foul Odor After Cleansing: No Slough/Fibrino No Wound Bed [place], Juanell Fairly ([id number]) [phone].pdf Page 8 of 9 Granulation Amount: Large (67-100%) Exposed Structure Granulation Quality: Red Fat Layer (Subcutaneous Tissue) Exposed: Yes Necrotic Amount: None Present (0%) Periwound Skin Texture Texture Color No Abnormalities Noted: Yes No Abnormalities Noted: Yes Moisture Temperature / Pain No Abnormalities Noted: Yes Temperature: No  Abnormality Treatment Notes Wound #24 (Lower Leg) Wound Laterality: Left, Anterior Cleanser Soap and Water Discharge Instruction: May shower and wash wound with dial antibacterial soap and water prior to dressing change. Wound Cleanser Discharge Instruction: Cleanse the wound with wound cleanser prior to applying a clean dressing using gauze sponges, not tissue or cotton balls. Peri-Wound Care Topical Skintegrity Hydrogel 4 (oz) Discharge Instruction: Apply hydrogel thin layer over collagen Primary Dressing Sorbalgon AG Dressing 2x2 (in/in) Discharge Instruction: Apply to wound bed as instructed Secondary Dressing Woven Gauze Sponges 2x2 in Discharge Instruction: moisten with saline and Apply over santyl to fill the wound Zetuvit Plus Silicone Border Dressing 7x7(in/in) Discharge Instruction: Apply silicone border over primary dressing as directed. Secured With 63M Medipore H Soft Cloth Surgical T ape, 4 x 10 (in/yd) Discharge Instruction: Secure with tape as needed Compression Wrap Compression Stockings Add-Ons Electronic Signature(s) Signed: 12/11/2022 5:16:32 PM By: Sharyn Creamer RN, BSN Entered By: Sharyn Creamer on 12/11/2022 13:10:38 -------------------------------------------------------------------------------- Vitals Details Patient Name: Date of Service: [name], [place] Y. 12/11/2022 12:30 PM Medical Record Number: [id number] Patient Account Number: [id number] Date of Birth/Sex: Treating RN: 04/14/1956 (67 y.o. Donalda Ewings Primary Care Yoona Ishii: Milus Mallick, MA RITZA Other Clinician: Referring Giancarlos Berendt: Treating Adelei Scobey/Extender: Terese Door, MA RITZA Weeks in Treatment: 10 Vital Signs Time Taken: 12:57 Temperature (F): 97.8 Height (in): 64 Pulse (bpm): 58 Weight (lbs): 175 Respiratory Rate (breaths/min): 16 Body Mass Index (BMI): 30 Blood Pressure (mmHg): 119/68 Reference Range: 80 - 120 mg / dl [place] ([id number])  [phone].pdf Page 9 of 9 Electronic Signature(s) Signed: 12/11/2022 5:16:32 PM By: Sharyn Creamer RN, BSN Entered By: Sharyn Creamer on 12/11/2022 12:59:50

## 2022-12-19 LAB — POCT INR: INR: 2.7 — AB (ref 0.80–1.20)

## 2022-12-20 ENCOUNTER — Encounter: Payer: Self-pay | Admitting: Nurse Practitioner

## 2022-12-28 DIAGNOSIS — R339 Retention of urine, unspecified: Secondary | ICD-10-CM | POA: Diagnosis not present

## 2023-01-04 DIAGNOSIS — R339 Retention of urine, unspecified: Secondary | ICD-10-CM | POA: Diagnosis not present

## 2023-01-08 ENCOUNTER — Encounter (HOSPITAL_BASED_OUTPATIENT_CLINIC_OR_DEPARTMENT_OTHER): Payer: HMO | Attending: General Surgery | Admitting: General Surgery

## 2023-01-08 DIAGNOSIS — L97821 Non-pressure chronic ulcer of other part of left lower leg limited to breakdown of skin: Secondary | ICD-10-CM | POA: Diagnosis not present

## 2023-01-08 DIAGNOSIS — L89623 Pressure ulcer of left heel, stage 3: Secondary | ICD-10-CM | POA: Diagnosis not present

## 2023-01-08 DIAGNOSIS — Z7901 Long term (current) use of anticoagulants: Secondary | ICD-10-CM | POA: Diagnosis not present

## 2023-01-08 DIAGNOSIS — L89524 Pressure ulcer of left ankle, stage 4: Secondary | ICD-10-CM | POA: Diagnosis not present

## 2023-01-08 DIAGNOSIS — L97828 Non-pressure chronic ulcer of other part of left lower leg with other specified severity: Secondary | ICD-10-CM | POA: Insufficient documentation

## 2023-01-08 DIAGNOSIS — E039 Hypothyroidism, unspecified: Secondary | ICD-10-CM | POA: Diagnosis not present

## 2023-01-08 DIAGNOSIS — G8250 Quadriplegia, unspecified: Secondary | ICD-10-CM | POA: Diagnosis not present
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
## 2023-01-09 DIAGNOSIS — L89623 Pressure ulcer of left heel, stage 3: Secondary | ICD-10-CM | POA: Diagnosis not present

## 2023-01-09 NOTE — Progress Notes (Signed)
[place], Gordon Y ([id number]) [id number] Page 1 of 10 Visit Report for 01/08/2023 Arrival Information Details Patient Name: Date of Service: [name], [name] 01/08/2023 12:30 PM Medical Record Number: [id number] Patient Account Number: [id number] Date of Birth/Sex: Treating RN: [birth year] (67 [name] Dyna Figuereo: Milus Mallick, Angela RITZA Other Clinician: Referring Teneisha Gignac: Treating Astra Gregg/Extender: Terese Door, Angela RITZA Weeks in Treatment: 15 Visit Information History Since Last Visit Added or deleted any medications: No Patient Arrived: Wheel Chair Any new allergies or adverse reactions: No Arrival Time: 12:43 Had a fall or experienced change in No Accompanied By: self activities of daily living that may affect Transfer Assistance: None risk of falls: Patient Requires Transmission-Based Precautions: No Signs or symptoms of abuse/neglect since last visito No Patient Has Alerts: Yes Hospitalized since last visit: No Patient Alerts: Patient on Blood Thinner Implantable device outside of the clinic excluding No Coumadin cellular tissue based products placed in the center since last visit: Has Dressing in Place as Prescribed: Yes Has Compression in Place as Prescribed: Yes Pain Present Now: No Notes stays in wheelchair Electronic Signature(s) Signed: 01/08/2023 4:57:00 PM By: Baruch Gouty RN, BSN Entered By: Baruch Gouty on 01/08/2023 12:44:18 -------------------------------------------------------------------------------- Encounter Discharge Information Details Patient Name: Date of Service: [name], Angela [place] Y. 01/08/2023 12:30 PM Medical Record Number: [id number] Patient Account Number: [id number] Date of Birth/Sex: Treating RN: 1956-02-06 (32 [name] Terria Deschepper: Milus Mallick, Angela RITZA Other Clinician: Referring Yamir Carignan: Treating Damiel Barthold/Extender: Terese Door, Angela  RITZA Weeks in Treatment: 86 Encounter Discharge Information Items Post Procedure Vitals Discharge Condition: Stable Temperature (F): 97.8 Ambulatory Status: Wheelchair Pulse (bpm): 58 Discharge Destination: Home Respiratory Rate (breaths/min): 18 Transportation: Private Auto Blood Pressure (mmHg): 107/46 Accompanied By: daughter Schedule Follow-up Appointment: Yes Clinical Summary of Care: Patient Declined Electronic Signature(s) Signed: 01/08/2023 4:57:00 PM By: Baruch Gouty RN, BSN Entered By: Baruch Gouty on 01/08/2023 13:52:24 [place], [place] Y ([id number]) [id number] Page 2 of 10 -------------------------------------------------------------------------------- Lower Extremity Assessment Details Patient Name: Date of Service: [name], [name] 01/08/2023 12:30 PM Medical Record Number: [id number] Patient Account Number: [id number] Date of Birth/Sex: Treating RN: 06/04/1956 (76 [name] Ahtziri Jeffries: Milus Mallick, Angela RITZA Other Clinician: Referring Williams Dietrick: Treating Aalivia Mcgraw/Extender: Terese Door, Angela RITZA Weeks in Treatment: 51 Edema Assessment Assessed: [Left: No] [Right: No] Edema: [Left: Ye] [Right: s] Calf Left: Right: Point of Measurement: 31 cm From Medial Instep 27 cm Ankle Left: Right: Point of Measurement: 7 cm From Medial Instep 20.7 cm Vascular Assessment Pulses: Dorsalis Pedis Palpable: [Left:Yes] Electronic Signature(s) Signed: 01/08/2023 4:57:00 PM By: Baruch Gouty RN, BSN Entered By: Baruch Gouty on 01/08/2023 12:57:36 -------------------------------------------------------------------------------- Multi Wound Chart Details Patient Name: Date of Service: Angela Balzarine, Angela RTHA Y. 01/08/2023 12:30 PM Medical Record Number: [id number] Patient Account Number: [id number] Date of Birth/Sex: Treating RN: [birth year] (14 [name]) Primary Care Lam Mccubbins: A Georges Lynch, Angela RITZA Other Clinician: Referring  Sidhant Helderman: Treating Damary Doland/Extender: Terese Door, Angela RITZA Weeks in Treatment: 51 Vital Signs Height(in): 64 Pulse(bpm): 58 Weight(lbs): 175 Blood Pressure(mmHg): 107/46 Body Mass Index(BMI): 30 Temperature(F): 97.8 Respiratory Rate(breaths/min): 18 [22:Photos:] [id number] Page 3 of 10] Left Calcaneus Left, Posterior Ankle Left, Anterior Lower Leg Wound Location: Pressure Injury Pressure Injury Blister Wounding Event: Pressure Ulcer Pressure Ulcer Pressure Ulcer Primary Etiology: Cataracts, Hypotension, Neuropathy, Cataracts, Hypotension, Neuropathy, Cataracts, Hypotension, Neuropathy, Comorbid History: Quadriplegia Quadriplegia Quadriplegia  11/03/2021 11/03/2021 11/22/2022 Date Acquired: 2 51 4 Weeks of Treatment: Open Open Healed - Epithelialized Wound Status: No No No Wound Recurrence: 1.9x3.7x0.2 0.9x1.5x0.3 0x0x0 Measurements L x W x D (cm) 5.521 1.06 0 A (cm) : rea 1.104 0.318 0 Volume (cm) : 12.10% 75.00% 100.00% % Reduction in A rea: 41.40% 81.30% 100.00% % Reduction in Volume: Category/Stage III Category/Stage IV Category/Stage II Classification: Medium Medium None Present Exudate A mount: Serosanguineous Serosanguineous N/A Exudate Type: red, brown red, brown N/A Exudate Color: Distinct, outline attached Flat and Intact N/A Wound Margin: Small (1-33%) None Present (0%) None Present (0%) Granulation A mount: Red N/A N/A Granulation Quality: Large (67-100%) Large (67-100%) None Present (0%) Necrotic A mount: Fat Layer (Subcutaneous Tissue): Yes Fat Layer (Subcutaneous Tissue): Yes Fascia: No Exposed Structures: Fascia: No Tendon: Yes Fat Layer (Subcutaneous Tissue): No Tendon: No Fascia: No Tendon: No Muscle: No Muscle: No Muscle: No Joint: No Joint: No Joint: No Bone: No Bone: No Bone: No None None Large (67-100%) Epithelialization: No Abnormalities Noted No Abnormalities Noted No  Abnormalities Noted Periwound Skin Texture: No Abnormalities Noted No Abnormalities Noted No Abnormalities Noted Periwound Skin Moisture: No Abnormalities Noted No Abnormalities Noted No Abnormalities Noted Periwound Skin Color: No Abnormality No Abnormality No Abnormality Temperature: Treatment Notes Electronic Signature(s) Signed: 01/08/2023 1:24:22 PM By: Fredirick Maudlin MD FACS Entered By: Fredirick Maudlin on 01/08/2023 13:24:21 -------------------------------------------------------------------------------- Multi-Disciplinary Care Plan Details Patient Name: Date of Service: [name], Angela Y. 01/08/2023 12:30 PM Medical Record Number: [id number] Patient Account Number: [id number] Date of Birth/Sex: Treating RN: 1956-07-31 (89 [name] Mima Cranmore: Milus Mallick, Angela RITZA Other Clinician: Referring Nohemy Koop: Treating Valor Quaintance/Extender: Terese Door, Angela RITZA Weeks in Treatment: [place] reviewed with physician Active Inactive Pressure Nursing Diagnoses: Knowledge deficit related to causes and risk factors for pressure ulcer development Knowledge deficit related to management of pressures ulcers Potential for impaired tissue integrity related to pressure, friction, moisture, and shear Goals: Patient/caregiver will verbalize understanding of pressure ulcer management Date Initiated: 03/20/2022 Target Resolution Date: 02/05/2023 Goal Status: Active Interventions: Assess: immobility, friction, shearing, incontinence upon admission and as needed Assess offloading mechanisms upon admission and as needed [place] ([id number]) [phone].pdf Page 4 of 10 Assess potential for pressure ulcer upon admission and as needed Notes: Wound/Skin Impairment Nursing Diagnoses: Impaired tissue integrity Knowledge deficit related to smoking impact on wound healing Goals: Patient/caregiver will verbalize  understanding of skin care regimen Date Initiated: 01/12/2022 Target Resolution Date: 02/05/2023 Goal Status: Active Interventions: Assess patient/caregiver ability to obtain necessary supplies Assess patient/caregiver ability to perform ulcer/skin care regimen upon admission and as needed Assess ulceration(s) every visit Provide education on smoking Provide education on ulcer and skin care Notes: Electronic Signature(s) Signed: 01/08/2023 4:57:00 PM By: Baruch Gouty RN, BSN Entered By: Baruch Gouty on 01/08/2023 13:08:27 -------------------------------------------------------------------------------- Pain Assessment Details Patient Name: Date of Service: [name], Angela Y. 01/08/2023 12:30 PM Medical Record Number: [id number] Patient Account Number: [id number] Date of Birth/Sex: Treating RN: [birth year] (51 [name] Naamah Boggess: Milus Mallick, Angela RITZA Other Clinician: Referring Codi Folkerts: Treating Lahela Woodin/Extender: Terese Door, Angela RITZA Weeks in Treatment: 41 Active Problems Location of Pain Severity and Description of Pain Patient Has Paino No Site Locations Rate the pain. Current Pain Level: 0 Pain Management and Medication Current Pain Management: Electronic Signature(s) Signed: 01/08/2023 4:57:00 PM By: Baruch Gouty RN, BSN [place], [place] Y ([id number]) [id number] Page  5 of 10 Entered By: Baruch Gouty on 01/08/2023 12:46:24 -------------------------------------------------------------------------------- Patient/Caregiver Education Details Patient Name: Date of Service: [place], [name] 2/5/2024andnbsp12:30 PM Medical Record Number: [id number] Patient Account Number: [id number] Date of Birth/Gender: Treating RN: Apr 06, 1956 (67 [name] Physician: Milus Mallick, Angela RITZA Other Clinician: Referring Physician: Treating Physician/Extender: Terese Door, Angela RITZA Weeks in  Treatment: 32 Education Assessment Education Provided To: Patient Education Topics Provided Pressure: Methods: Explain/Verbal Responses: Reinforcements needed, State content correctly Wound/Skin Impairment: Methods: Explain/Verbal Responses: Reinforcements needed, State content correctly Electronic Signature(s) Signed: 01/08/2023 4:57:00 PM By: Baruch Gouty RN, BSN Entered By: Baruch Gouty on 01/08/2023 13:08:52 -------------------------------------------------------------------------------- Wound Assessment Details Patient Name: Date of Service: [name], Angela Y. 01/08/2023 12:30 PM Medical Record Number: [id number] Patient Account Number: [id number] Date of Birth/Sex: Treating RN: September 11, 1956 (13 [name] Delmon Andrada: Milus Mallick, Angela RITZA Other Clinician: Referring Sharlyn Odonnel: Treating Chianne Byrns/Extender: Terese Door, Angela RITZA Weeks in Treatment: 51 Wound Status Wound Number: 22 Primary Etiology: Pressure Ulcer Wound Location: Left Calcaneus Wound Status: Open Wounding Event: Pressure Injury Comorbid History: Cataracts, Hypotension, Neuropathy, Quadriplegia Date Acquired: 11/03/2021 Weeks Of Treatment: 51 Clustered Wound: No Photos [place], [place] Y ([id number]) [phone].pdf Page 6 of 10 Wound Measurements Length: (cm) 1.9 Width: (cm) 3.7 Depth: (cm) 0.2 Area: (cm) 5.521 Volume: (cm) 1.104 % Reduction in Area: 12.1% % Reduction in Volume: 41.4% Epithelialization: None Tunneling: No Undermining: No Wound Description Classification: Category/Stage III Wound Margin: Distinct, outline attached Exudate Amount: Medium Exudate Type: Serosanguineous Exudate Color: red, brown Foul Odor After Cleansing: No Slough/Fibrino Yes Wound Bed Granulation Amount: Small (1-33%) Exposed Structure Granulation Quality: Red Fascia Exposed: No Necrotic Amount: Large (67-100%) Fat Layer (Subcutaneous Tissue) Exposed:  Yes Necrotic Quality: Adherent Slough Tendon Exposed: No Muscle Exposed: No Joint Exposed: No Bone Exposed: No Periwound Skin Texture Texture Color No Abnormalities Noted: Yes No Abnormalities Noted: Yes Moisture Temperature / Pain No Abnormalities Noted: Yes Temperature: No Abnormality Treatment Notes Wound #22 (Calcaneus) Wound Laterality: Left Cleanser Normal Saline Discharge Instruction: Cleanse the wound with Normal Saline prior to applying a clean dressing using gauze sponges, not tissue or cotton balls. Soap and Water Discharge Instruction: May shower and wash wound with dial antibacterial soap and water prior to dressing change. Wound Cleanser Discharge Instruction: Cleanse the wound with wound cleanser prior to applying a clean dressing using gauze sponges, not tissue or cotton balls. Peri-Wound Care Topical Primary Dressing Hydrofera Blue Classic Foam, 4x4 in Discharge Instruction: Moisten with saline prior to applying to wound bed Secondary Dressing Woven Gauze Sponges 2x2 in Discharge Instruction: moisten with saline and Apply over santyl to fill the wound Zetuvit Plus Silicone Border Dressing 5x5 (in/in) Discharge Instruction: Apply silicone border over primary dressing as directed. Secured With 36M Medipore H Soft Cloth Surgical Walker ape, 4 x 10 (in/yd) Discharge Instruction: Secure with tape as needed Compression [place], [place] Y ([id number]) [id number] Page 7 of 10 Compression Stockings Add-Ons Electronic Signature(s) Signed: 01/08/2023 4:57:00 PM By: Baruch Gouty RN, BSN Entered By: Baruch Gouty on 01/08/2023 13:06:01 -------------------------------------------------------------------------------- Wound Assessment Details Patient Name: Date of Service: [name], Angela Y. 01/08/2023 12:30 PM Medical Record Number: [id number] Patient Account Number: [id number] Date of Birth/Sex: Treating RN: 1956/04/05 (39 [name] Cherlynn Popiel: Milus Mallick, Angela RITZA Other Clinician: Referring Ermel Verne: Treating Korie Brabson/Extender: Terese Door, Angela RITZA Weeks in Treatment: [phone]  Wound Status Wound Number: 23 Primary Etiology: Pressure Ulcer Wound Location: Left, Posterior Ankle Wound Status: Open Wounding Event: Pressure Injury Comorbid History: Cataracts, Hypotension, Neuropathy, Quadriplegia Date Acquired: 11/03/2021 Weeks Of Treatment: 51 Clustered Wound: No Photos Wound Measurements Length: (cm) 0.9 Width: (cm) 1.5 Depth: (cm) 0.3 Area: (cm) 1.06 Volume: (cm) 0.318 % Reduction in Area: 75% % Reduction in Volume: 81.3% Epithelialization: None Tunneling: No Undermining: No Wound Description Classification: Category/Stage IV Wound Margin: Flat and Intact Exudate Amount: Medium Exudate Type: Serosanguineous Exudate Color: red, brown Foul Odor After Cleansing: No Slough/Fibrino Yes Wound Bed Granulation Amount: None Present (0%) Exposed Structure Necrotic Amount: Large (67-100%) Fascia Exposed: No Necrotic Quality: Adherent Slough Fat Layer (Subcutaneous Tissue) Exposed: Yes Tendon Exposed: Yes Muscle Exposed: No Joint Exposed: No Bone Exposed: No Periwound Skin Texture Texture Color No Abnormalities Noted: Yes No Abnormalities NotedTEARRA, OUK ([id number]) [id number] Page 8 of 10 Moisture Temperature / Pain No Abnormalities Noted: Yes Temperature: No Abnormality Treatment Notes Wound #23 (Ankle) Wound Laterality: Left, Posterior Cleanser Normal Saline Discharge Instruction: Cleanse the wound with Normal Saline prior to applying a clean dressing using gauze sponges, not tissue or cotton balls. Soap and Water Discharge Instruction: May shower and wash wound with dial antibacterial soap and water prior to dressing change. Wound Cleanser Discharge Instruction: Cleanse the wound with wound cleanser prior to applying a clean  dressing using gauze sponges, not tissue or cotton balls. Peri-Wound Care Topical Primary Dressing Hydrofera Blue Classic Foam, 4x4 in Discharge Instruction: Moisten with saline prior to applying to wound bed Secondary Dressing Woven Gauze Sponges 2x2 in Discharge Instruction: moisten with saline and Apply over santyl to fill the wound Zetuvit Plus Silicone Border Dressing 5x5 (in/in) Discharge Instruction: Apply silicone border over primary dressing as directed. Secured With 79M Medipore H Soft Cloth Surgical Walker ape, 4 x 10 (in/yd) Discharge Instruction: Secure with tape as needed Compression Wrap Compression Stockings Add-Ons Electronic Signature(s) Signed: 01/08/2023 4:57:00 PM By: Baruch Gouty RN, BSN Entered By: Baruch Gouty on 01/08/2023 13:06:34 -------------------------------------------------------------------------------- Wound Assessment Details Patient Name: Date of Service: [name], Angela Y. 01/08/2023 12:30 PM Medical Record Number: [id number] Patient Account Number: [id number] Date of Birth/Sex: Treating RN: 1956-04-22 (57 [name] Tonia Avino: Milus Mallick, Angela RITZA Other Clinician: Referring Marice Angelino: Treating Mallori Araque/Extender: Terese Door, Angela RITZA Weeks in Treatment: 51 Wound Status Wound Number: 24 Primary Etiology: Pressure Ulcer Wound Location: Left, Anterior Lower Leg Wound Status: Healed - Epithelialized Wounding Event: Blister Comorbid History: Cataracts, Hypotension, Neuropathy, Quadriplegia Date Acquired: 11/22/2022 [facility] Of Treatment: 4 Clustered Wound: No Photos [place], [name] ([id number]) [phone].pdf Page 9 of 10 Wound Measurements Length: (cm) Width: (cm) Depth: (cm) Area: (cm) Volume: (cm) 0 % Reduction in Area: 100% 0 % Reduction in Volume: 100% 0 Epithelialization: Large (67-100%) 0 Tunneling: No 0 Undermining: No Wound Description Classification: Category/Stage  II Exudate Amount: None Present Foul Odor After Cleansing: No Slough/Fibrino No Wound Bed Granulation Amount: None Present (0%) Exposed Structure Necrotic Amount: None Present (0%) Fascia Exposed: No Fat Layer (Subcutaneous Tissue) Exposed: No Tendon Exposed: No Muscle Exposed: No Joint Exposed: No Bone Exposed: No Periwound Skin Texture Texture Color No Abnormalities Noted: Yes No Abnormalities Noted: Yes Moisture Temperature / Pain No Abnormalities Noted: Yes Temperature: No Abnormality Electronic Signature(s) Signed: 01/08/2023 4:57:00 PM By: Baruch Gouty RN, BSN Entered By: Baruch Gouty on 01/08/2023 13:07:11 -------------------------------------------------------------------------------- Vitals Details Patient Name: Date of Service: Angela Balzarine, Angela  Tilda Franco Y. 01/08/2023 12:30 PM Medical Record Number: [id number] Patient Account Number: [id number] Date of Birth/Sex: Treating RN: 1956/08/02 (38 [name] Telia Amundson: Milus Mallick, Angela RITZA Other Clinician: Referring Yachet Mattson: Treating Latrelle Bazar/Extender: Terese Door, Angela RITZA Weeks in Treatment: 51 Vital Signs Time Taken: 12:46 Temperature (F): 97.8 Height (in): 64 Pulse (bpm): 58 Weight (lbs): 175 Respiratory Rate (breaths/min): 18 Body Mass Index (BMI): 30 Blood Pressure (mmHg): 107/46 Reference Range: 80 - 120 mg / dl Electronic Signature(s) Signed: 01/08/2023 4:57:00 PM By: Baruch Gouty RN, BSN Entered By: Baruch Gouty on 01/08/2023 12:47:40 [place], [place] Y ([id number]) [id number] Page 10 of 10

## 2023-01-22 ENCOUNTER — Encounter: Payer: Self-pay | Admitting: Family Medicine

## 2023-01-22 ENCOUNTER — Other Ambulatory Visit (HOSPITAL_COMMUNITY): Payer: Self-pay

## 2023-01-22 DIAGNOSIS — G839 Paralytic syndrome, unspecified: Secondary | ICD-10-CM | POA: Diagnosis not present

## 2023-01-22 DIAGNOSIS — Z7901 Long term (current) use of anticoagulants: Secondary | ICD-10-CM | POA: Diagnosis not present

## 2023-01-22 LAB — POCT INR: INR: 1.9 — AB (ref 0.80–1.20)

## 2023-01-23 ENCOUNTER — Other Ambulatory Visit (HOSPITAL_COMMUNITY): Payer: Self-pay

## 2023-01-23 ENCOUNTER — Telehealth: Payer: Self-pay

## 2023-01-23 ENCOUNTER — Other Ambulatory Visit: Payer: Self-pay

## 2023-01-23 ENCOUNTER — Other Ambulatory Visit: Payer: Self-pay | Admitting: Nurse Practitioner

## 2023-01-23 DIAGNOSIS — G904 Autonomic dysreflexia: Secondary | ICD-10-CM

## 2023-01-23 DIAGNOSIS — G894 Chronic pain syndrome: Secondary | ICD-10-CM

## 2023-01-23 DIAGNOSIS — E038 Other specified hypothyroidism: Secondary | ICD-10-CM

## 2023-01-23 MED ORDER — LEVOTHYROXINE SODIUM 50 MCG PO TABS
50.0000 ug | ORAL_TABLET | Freq: Every day | ORAL | 0 refills | Status: DC
  Filled 2023-01-23: qty 100, 100d supply, fill #0

## 2023-01-23 MED ORDER — ACETAMINOPHEN-CODEINE 300-30 MG PO TABS
1.0000 | ORAL_TABLET | ORAL | 1 refills | Status: DC | PRN
  Filled 2023-01-23: qty 30, 3d supply, fill #0

## 2023-01-23 NOTE — Telephone Encounter (Signed)
Pt is requesting a refill: acetaminophen-codeine (TYLENOL #3) 300-30 MG tablet  Pt wasn't able to get the last Rx states it was on back order  levothyroxine (SYNTHROID) 50 MCG tablet   Pharmacy: [facility]   LOV 11/06/22 ROV 03/12/23

## 2023-01-23 NOTE — Telephone Encounter (Signed)
Levothyroxine sent to  [facility]. Spoke with pharmacy staff at [facility] in regards to Tylenol #3 script. Medication is available there but script was previously sent to [facility]

## 2023-01-23 NOTE — Telephone Encounter (Signed)
I sent this to [facility] long pharmacy for her.

## 2023-01-24 ENCOUNTER — Other Ambulatory Visit: Payer: Self-pay | Admitting: Nurse Practitioner

## 2023-01-24 ENCOUNTER — Other Ambulatory Visit: Payer: Self-pay

## 2023-01-30 ENCOUNTER — Other Ambulatory Visit: Payer: Self-pay

## 2023-01-30 MED ORDER — POTASSIUM CHLORIDE ER 10 MEQ PO TBCR: 10.0000 meq | EXTENDED_RELEASE_TABLET | Freq: Three times a day (TID) | ORAL | 1 refills | Status: DC

## 2023-01-31 DIAGNOSIS — R339 Retention of urine, unspecified: Secondary | ICD-10-CM | POA: Diagnosis not present

## 2023-02-05 ENCOUNTER — Encounter (HOSPITAL_BASED_OUTPATIENT_CLINIC_OR_DEPARTMENT_OTHER): Payer: HMO | Admitting: General Surgery

## 2023-02-06 LAB — POCT INR: INR: 2.2 — AB (ref 0.80–1.20)

## 2023-02-07 ENCOUNTER — Encounter: Payer: Self-pay | Admitting: Family Medicine

## 2023-02-19 ENCOUNTER — Encounter (HOSPITAL_BASED_OUTPATIENT_CLINIC_OR_DEPARTMENT_OTHER): Payer: HMO | Attending: General Surgery | Admitting: Internal Medicine

## 2023-02-19 DIAGNOSIS — L97828 Non-pressure chronic ulcer of other part of left lower leg with other specified severity: Secondary | ICD-10-CM

## 2023-02-19 DIAGNOSIS — L89623 Pressure ulcer of left heel, stage 3: Secondary | ICD-10-CM | POA: Diagnosis not present

## 2023-02-19 DIAGNOSIS — S14109S Unspecified injury at unspecified level of cervical spinal cord, sequela: Secondary | ICD-10-CM

## 2023-02-19 DIAGNOSIS — L89519 Pressure ulcer of right ankle, unspecified stage: Secondary | ICD-10-CM | POA: Insufficient documentation

## 2023-02-19 DIAGNOSIS — Z981 Arthrodesis status: Secondary | ICD-10-CM | POA: Insufficient documentation

## 2023-02-19 DIAGNOSIS — G8250 Quadriplegia, unspecified: Secondary | ICD-10-CM | POA: Diagnosis not present

## 2023-02-19 DIAGNOSIS — L97821 Non-pressure chronic ulcer of other part of left lower leg limited to breakdown of skin: Secondary | ICD-10-CM | POA: Diagnosis not present
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
## 2023-02-20 NOTE — Progress Notes (Signed)
Sebewaing, Humphrey Y ([id number]) [id number] Page 1 of 8 Visit Report for 02/19/2023 Arrival Information Details Patient Name: Date of Service: [place], [name] 02/19/2023 1:15 PM Medical Record Number: [id number] Patient Account Number: [id number] Date of Birth/Sex: Treating RN: 01/16/1956 (67 y.o. [name] Primary Care Brittney Mucha: [place]da [place], [place] [facility] N Other Clinician: Referring Danette Weinfeld: Treating Shannel Zahm/Extender: Rodolph Bong, MA RITZA Weeks in Treatment: 24 Visit Information History Since Last Visit Added or deleted any medications: No Patient Arrived: Wheel Chair Any new allergies or adverse reactions: No Arrival Time: 13:05 Had a fall or experienced change in No Accompanied By: sister activities of daily living that may affect Transfer Assistance: None risk of falls: Patient Requires Transmission-Based Precautions: No Signs or symptoms of abuse/neglect since last visito No Patient Has Alerts: Yes Hospitalized since last visit: No Patient Alerts: Patient on Blood Thinner Implantable device outside of the clinic excluding No Coumadin cellular tissue based products placed in the center since last visit: Has Dressing in Place as Prescribed: Yes Has Compression in Place as Prescribed: Yes Has Footwear/Offloading in Place as Prescribed: Yes Left: Other:prevalon boots Right: Other:prevalon boots Pain Present Now: No Notes stays in wheelchair Electronic Signature(s) Signed: 02/19/2023 5:42:59 PM By: Baruch Gouty RN, BSN Entered By: Baruch Gouty on 02/19/2023 13:10:01 -------------------------------------------------------------------------------- Encounter Discharge Information Details Patient Name: Date of Service: [name], [name] 02/19/2023 1:15 PM Medical Record Number: [id number] Patient Account Number: [id number] Date of Birth/Sex: Treating RN: [birth year] (67 y.o. [name] Primary Care Talan Gildner: Lynnda [place], [place]  [facility] N Other Clinician: Referring Sabrinna Yearwood: Treating Lian Tanori/Extender: Rodolph Bong, MA RITZA Weeks in Treatment: 64 Encounter Discharge Information Items Post Procedure Vitals Discharge Condition: Stable Temperature (F): 97.8 Ambulatory Status: Wheelchair Pulse (bpm): 69 Discharge Destination: Home Respiratory Rate (breaths/min): 18 Transportation: Private Auto Blood Pressure (mmHg): 122/79 Accompanied By: sister Schedule Follow-up Appointment: Yes Clinical Summary of Care: Patient Declined Electronic Signature(s) Signed: 02/19/2023 5:42:59 PM By: Baruch Gouty RN, BSN Entered By: Baruch Gouty on 02/19/2023 13:50:43 -------------------------------------------------------------------------------- Lower Extremity Assessment Details Patient Name: Date of Service: [facility] [name] 02/19/2023 1:15 PM Medical Record Number: [id number] Patient Account Number: [id number] Date of Birth/Sex: Treating RN: 1956/07/30 (67 y.o. [name] Primary Care Debbra Digiulio: [place], [place] [facility] N Other Clinician: CARIL, LINGE ([id number]) [id number] Page 2 of 8 Referring Delcie Ruppert: Treating Senita Corredor/Extender: Rodolph Bong, MA RITZA Weeks in Treatment: 57 Edema Assessment Assessed: [Left: No] [Right: No] Edema: [Left: Ye] [Right: s] Calf Left: Right: Point of Measurement: 31 cm From Medial Instep 28.5 cm Ankle Left: Right: Point of Measurement: 7 cm From Medial Instep 21.3 cm Vascular Assessment Pulses: Dorsalis Pedis Palpable: [Left:Yes] Electronic Signature(s) Signed: 02/19/2023 5:42:59 PM By: Baruch Gouty RN, BSN Entered By: Baruch Gouty on 02/19/2023 13:14:09 -------------------------------------------------------------------------------- Multi Wound Chart Details Patient Name: Date of Service: [name], [name] 02/19/2023 1:15 PM Medical Record Number: [id number] Patient Account Number: [id number] Date of Birth/Sex:  Treating RN: Dec 05, 1955 (67 y.o. F) Primary Care Artyom Stencel: [place]da [place], [place] [facility] N Other Clinician: Referring Jimmylee Ratterree: Treating Jaydien Panepinto/Extender: Rodolph Bong, MA RITZA Weeks in Treatment: 46 Vital Signs Height(in): 64 Pulse(bpm): 69 Weight(lbs): 175 Blood Pressure(mmHg): 122/79 Body Mass Index(BMI): 30 Temperature(F): 97.8 Respiratory Rate(breaths/min): 18 [22:Photos:] [N/A:N/A] Left Calcaneus Left, Posterior Ankle N/A Wound Location: Pressure Injury Pressure Injury N/A Wounding Event: Pressure Ulcer Pressure Ulcer N/A Primary Etiology: Cataracts, Hypotension, Neuropathy, Cataracts, Hypotension, Neuropathy, N/A Comorbid History:  Quadriplegia Quadriplegia 11/03/2021 11/03/2021 N/A Date Acquired: 66 57 N/A Weeks of Treatment: Open Open N/A Wound Status: No No N/A Wound Recurrence: 1.2x3x0.6 0.7x0.5x0.5 N/A Measurements L x W x D (cm) 2.827 0.275 N/A A (cm) : rea 1.696 0.137 N/A Volume (cm) : 55.00% 93.50% N/A % Reduction in A rea: 10.00% 91.90% N/A % Reduction in Volume: 10 Starting Position 1 (o'clock): 12 Ending Position 1 (o'clock): 0.3 Maximum Distance 1 (cm): Yes No N/A UnderminingLYNNOX, PROFIT ([id number]) [id number] Page 3 of 8 Category/Stage III Category/Stage IV N/A Classification: Medium Medium N/A Exudate A mount: Serosanguineous Serosanguineous N/A Exudate Type: red, brown red, brown N/A Exudate Color: Distinct, outline attached Flat and Intact N/A Wound Margin: Medium (34-66%) Large (67-100%) N/A Granulation A mount: Pink Red N/A Granulation Quality: Medium (34-66%) Small (1-33%) N/A Necrotic A mount: Fat Layer (Subcutaneous Tissue): Yes Fat Layer (Subcutaneous Tissue): Yes N/A Exposed Structures: Fascia: No Fascia: No Tendon: No Tendon: No Muscle: No Muscle: No Joint: No Joint: No Bone: No Bone: No Small (1-33%) Small (1-33%) N/A Epithelialization: Chemical/Enzymatic/Mechanical  Chemical/Enzymatic/Mechanical N/A Debridement: N/A N/A N/A Instrument: None None N/A Bleeding: Debridement Treatment Response: Procedure was tolerated well Procedure was tolerated well N/A Post Debridement Measurements L x 1.2x3x0.6 0.7x0.5x0.5 N/A W x D (cm) 1.696 0.137 N/A Post Debridement Volume: (cm) Category/Stage III Category/Stage IV N/A Post Debridement Stage: No Abnormalities Noted No Abnormalities Noted N/A Periwound Skin Texture: No Abnormalities Noted No Abnormalities Noted N/A Periwound Skin Moisture: No Abnormalities Noted No Abnormalities Noted N/A Periwound Skin Color: No Abnormality No Abnormality N/A Temperature: Debridement Debridement N/A Procedures Performed: Treatment Notes Electronic Signature(s) Signed: 02/19/2023 4:17:30 PM By: Kalman Shan DO Entered By: Kalman Shan on 02/19/2023 13:39:42 -------------------------------------------------------------------------------- Multi-Disciplinary Care Plan Details Patient Name: Date of Service: [name], [name] 02/19/2023 1:15 PM Medical Record Number: [id number] Patient Account Number: [id number] Date of Birth/Sex: Treating RN: 01-31-1956 (67 y.o. [name] Primary Care Eleni Frank: Lynnda [place], [place] [facility] N Other Clinician: Referring Wm Fruchter: Treating Ellagrace Yoshida/Extender: Rodolph Bong, MA RITZA Weeks in Treatment: [place] reviewed with physician Active Inactive Pressure Nursing Diagnoses: Knowledge deficit related to causes and risk factors for pressure ulcer development Knowledge deficit related to management of pressures ulcers Potential for impaired tissue integrity related to pressure, friction, moisture, and shear Goals: Patient/caregiver will verbalize understanding of pressure ulcer management Date Initiated: 03/20/2022 Target Resolution Date: 03/05/2023 Goal Status: Active Interventions: Assess: immobility, friction, shearing, incontinence upon  admission and as needed Assess offloading mechanisms upon admission and as needed Assess potential for pressure ulcer upon admission and as needed Notes: Wound/Skin Impairment Nursing Diagnoses: Impaired tissue integrity Knowledge deficit related to smoking impact on wound healing [place] ([id number]) [id number] Page 4 of 8 Goals: Patient/caregiver will verbalize understanding of skin care regimen Date Initiated: 01/12/2022 Target Resolution Date: 03/05/2023 Goal Status: Active Interventions: Assess patient/caregiver ability to obtain necessary supplies Assess patient/caregiver ability to perform ulcer/skin care regimen upon admission and as needed Assess ulceration(s) every visit Provide education on smoking Provide education on ulcer and skin care Notes: Electronic Signature(s) Signed: 02/19/2023 5:42:59 PM By: Baruch Gouty RN, BSN Entered By: Baruch Gouty on 02/19/2023 13:23:14 -------------------------------------------------------------------------------- Pain Assessment Details Patient Name: Date of Service: [name], [place] Y. 02/19/2023 1:15 PM Medical Record Number: [id number] Patient Account Number: [id number] Date of Birth/Sex: Treating RN: Dec 25, 1955 (67 y.o. [name] Primary Care Yahmir Sokolov: Lynnda [place], [place] [facility] N Other Clinician: Referring Alim Cattell: Treating Avaneesh Pepitone/Extender: Kalman Shan  A BO NZA, MA RITZA Weeks in Treatment: 57 Active Problems Location of Pain Severity and Description of Pain Patient Has Paino No Site Locations Rate the pain. Current Pain Level: 0 Pain Management and Medication Current Pain Management: Electronic Signature(s) Signed: 02/19/2023 5:42:59 PM By: Baruch Gouty RN, BSN Entered By: Baruch Gouty on 02/19/2023 13:10:14 -------------------------------------------------------------------------------- Patient/Caregiver Education Details Patient Name: Date of Service: [name], [place] Cheryln Manly 3/18/2024andnbsp1:15 PM Medical Record Number: [id number] Patient Account Number: [id number] Date of Birth/Gender: Treating RN: [birth year] (67 y.o. [name] Primary Care Physician: Lynnda [place], [place] [facility] N Other Clinician: Referring Physician: Treating Physician/Extender: Rodolph Bong, MA RITZA Weeks in Treatment: [place] ([id number]) [phone].pdf Page 5 of 8 Education Assessment Education Provided To: Patient Education Topics Provided Pressure: Methods: Explain/Verbal Responses: Reinforcements needed, State content correctly Wound/Skin Impairment: Methods: Explain/Verbal Responses: Reinforcements needed, State content correctly Electronic Signature(s) Signed: 02/19/2023 5:42:59 PM By: Baruch Gouty RN, BSN Entered By: Baruch Gouty on 02/19/2023 13:23:40 -------------------------------------------------------------------------------- Wound Assessment Details Patient Name: Date of Service: [name], [name] 02/19/2023 1:15 PM Medical Record Number: [id number] Patient Account Number: [id number] Date of Birth/Sex: Treating RN: 01-07-1956 (67 y.o. [name] Primary Care Raesha Coonrod: [place]da [place], [place] [facility] N Other Clinician: Referring Nic Lampe: Treating Keeven Matty/Extender: Rodolph Bong, MA RITZA Weeks in Treatment: 57 Wound Status Wound Number: 22 Primary Etiology: Pressure Ulcer Wound Location: Left Calcaneus Wound Status: Open Wounding Event: Pressure Injury Comorbid History: Cataracts, Hypotension, Neuropathy, Quadriplegia Date Acquired: 11/03/2021 Weeks Of Treatment: 57 Clustered Wound: No Photos Wound Measurements Length: (cm) 1.2 Width: (cm) 3 Depth: (cm) 0.6 Area: (cm) 2.827 Volume: (cm) 1.696 % Reduction in Area: 55% % Reduction in Volume: 10% Epithelialization: Small (1-33%) Tunneling: No Undermining: Yes Starting Position (o'clock): 10 Ending Position (o'clock): 12 Maximum Distance:  (cm) 0.3 Wound Description Classification: Category/Stage III Wound Margin: Distinct, outline attached Exudate Amount: Medium Exudate Type: Serosanguineous Exudate Color: red, brown DANYELE, CULLIPHER ([id number]) Wound Bed Granulation Amount: Medium (34-66% Granulation Quality: Pink Necrotic Amount: Medium (34-66% Necrotic Quality: Adherent Sloug Foul Odor After Cleansing: No Slough/Fibrino Yes [id number] Page 6 of 8 ) Exposed Structure Fascia Exposed: No ) Fat Layer (Subcutaneous Tissue) Exposed: Yes h Tendon Exposed: No Muscle Exposed: No Joint Exposed: No Bone Exposed: No Periwound Skin Texture Texture Color No Abnormalities Noted: Yes No Abnormalities Noted: Yes Moisture Temperature / Pain No Abnormalities Noted: Yes Temperature: No Abnormality Treatment Notes Wound #22 (Calcaneus) Wound Laterality: Left Cleanser Normal Saline Discharge Instruction: Cleanse the wound with Normal Saline prior to applying a clean dressing using gauze sponges, not tissue or cotton balls. Soap and Water Discharge Instruction: May shower and wash wound with dial antibacterial soap and water prior to dressing change. Wound Cleanser Discharge Instruction: Cleanse the wound with wound cleanser prior to applying a clean dressing using gauze sponges, not tissue or cotton balls. Peri-Wound Care Topical Primary Dressing Hydrofera Blue Classic Foam, 4x4 in Discharge Instruction: Moisten with saline prior to applying to wound bed Santyl Ointment Discharge Instruction: Apply nickel thick amount to wound bed as instructed Secondary Dressing Zetuvit Plus Silicone Border Dressing 5x5 (in/in) Discharge Instruction: Apply silicone border over primary dressing as directed. Secured With [place] [facility] T ape, 4 x 10 (in/yd) Discharge Instruction: Secure with tape as needed Compression Wrap Compression Stockings Add-Ons Electronic Signature(s) Signed:  02/19/2023 4:54:15 PM By: Adline Peals Signed: 02/19/2023 5:42:59 PM By: Baruch Gouty RN, BSN Entered By: Pollie Friar,  Lovena Le on 02/19/2023 13:27:23 -------------------------------------------------------------------------------- Wound Assessment Details Patient Name: Date of Service: [name], [name] 02/19/2023 1:15 PM Medical Record Number: [id number] Patient Account Number: [id number] Date of Birth/Sex: Treating RN: 09-Sep-1956 (67 y.o. [name] Primary Care Rima Blizzard: [place], [place] [facility] N Other Clinician: Referring Latice Waitman: Treating Janyth Riera/Extender: Rodolph Bong, MA RITZA Weeks in Treatment: 57 Wound Status Wound Number: 23 Primary Etiology: Pressure Ulcer Wound Location: Left, Posterior Ankle Wound Status: Open Wounding Event: Pressure Injury Comorbid History: Cataracts, Hypotension, Neuropathy, Quadriplegia Date Acquired: 11/03/2021 DARIAH, CHIAVERINI ([id number]) [phone].pdf Page 7 of 8 Weeks Of Treatment: 57 Clustered Wound: No Photos Wound Measurements Length: (cm) 0.7 Width: (cm) 0.5 Depth: (cm) 0.5 Area: (cm) 0.275 Volume: (cm) 0.137 % Reduction in Area: 93.5% % Reduction in Volume: 91.9% Epithelialization: Small (1-33%) Tunneling: No Undermining: No Wound Description Classification: Category/Stage IV Wound Margin: Flat and Intact Exudate Amount: Medium Exudate Type: Serosanguineous Exudate Color: red, brown Foul Odor After Cleansing: No Slough/Fibrino Yes Wound Bed Granulation Amount: Large (67-100%) Exposed Structure Granulation Quality: Red Fascia Exposed: No Necrotic Amount: Small (1-33%) Fat Layer (Subcutaneous Tissue) Exposed: Yes Necrotic Quality: Adherent Slough Tendon Exposed: No Muscle Exposed: No Joint Exposed: No Bone Exposed: No Periwound Skin Texture Texture Color No Abnormalities Noted: Yes No Abnormalities Noted: Yes Moisture Temperature / Pain No Abnormalities Noted:  Yes Temperature: No Abnormality Treatment Notes Wound #23 (Ankle) Wound Laterality: Left, Posterior Cleanser Normal Saline Discharge Instruction: Cleanse the wound with Normal Saline prior to applying a clean dressing using gauze sponges, not tissue or cotton balls. Soap and Water Discharge Instruction: May shower and wash wound with dial antibacterial soap and water prior to dressing change. Wound Cleanser Discharge Instruction: Cleanse the wound with wound cleanser prior to applying a clean dressing using gauze sponges, not tissue or cotton balls. Peri-Wound Care Topical Primary Dressing Hydrofera Blue Classic Foam, 4x4 in Discharge Instruction: Moisten with saline prior to applying to wound bed Santyl Ointment Discharge Instruction: Apply nickel thick amount to wound bed as instructed Secondary Dressing Zetuvit Plus Silicone Border Dressing 5x5 (in/in) Discharge Instruction: Apply silicone border over primary dressing as directed. [place] ([id number]) [id number] Page 8 of 8 Secured With 79M Medipore H Soft Cloth Surgical T ape, 4 x 10 (in/yd) Discharge Instruction: Secure with tape as needed Compression Wrap Compression Stockings Add-Ons Electronic Signature(s) Signed: 02/19/2023 4:54:15 PM By: Adline Peals Signed: 02/19/2023 5:42:59 PM By: Baruch Gouty RN, BSN Entered By: Adline Peals on 02/19/2023 13:28:13 -------------------------------------------------------------------------------- Vitals Details Patient Name: Date of Service: [name], [place] Y. 02/19/2023 1:15 PM Medical Record Number: [id number] Patient Account Number: [id number] Date of Birth/Sex: Treating RN: 1956-04-20 (67 y.o. [name] Primary Care Sarahmarie Leavey: [place]da [place], [place] [facility] N Other Clinician: Referring Eryn Krejci: Treating Kalyn Hofstra/Extender: Rodolph Bong, MA RITZA Weeks in Treatment: 57 Vital Signs Time Taken: 13:09 Temperature (F):  97.8 Height (in): 64 Pulse (bpm): 69 Weight (lbs): 175 Respiratory Rate (breaths/min): 18 Body Mass Index (BMI): 30 Blood Pressure (mmHg): 122/79 Reference Range: 80 - 120 mg / dl Electronic Signature(s) Signed: 02/19/2023 5:42:59 PM By: Baruch Gouty RN, BSN Entered By: Baruch Gouty on 02/19/2023 13:09:49

## 2023-02-23 DIAGNOSIS — L89623 Pressure ulcer of left heel, stage 3: Secondary | ICD-10-CM | POA: Diagnosis not present

## 2023-02-26 ENCOUNTER — Other Ambulatory Visit: Payer: Self-pay

## 2023-02-26 DIAGNOSIS — E038 Other specified hypothyroidism: Secondary | ICD-10-CM

## 2023-02-26 DIAGNOSIS — G8250 Quadriplegia, unspecified: Secondary | ICD-10-CM

## 2023-02-26 DIAGNOSIS — N319 Neuromuscular dysfunction of bladder, unspecified: Secondary | ICD-10-CM

## 2023-02-26 MED ORDER — WARFARIN SODIUM 2 MG PO TABS: ORAL_TABLET | ORAL | 5 refills | Status: DC

## 2023-02-26 MED ORDER — LEVOTHYROXINE SODIUM 50 MCG PO TABS
50.0000 ug | ORAL_TABLET | Freq: Every day | ORAL | 1 refills | Status: DC
  Filled 2023-02-26 – 2023-04-27 (×2): qty 100, 100d supply, fill #0
  Filled 2023-08-06: qty 100, 100d supply, fill #1

## 2023-02-26 MED ORDER — BUMETANIDE 1 MG PO TABS
1.0000 mg | ORAL_TABLET | Freq: Every day | ORAL | 1 refills | Status: DC
  Filled 2023-02-26: qty 100, 100d supply, fill #0
  Filled 2023-06-18: qty 100, 100d supply, fill #1

## 2023-02-26 MED ORDER — BACLOFEN 20 MG PO TABS
20.0000 mg | ORAL_TABLET | Freq: Two times a day (BID) | ORAL | 1 refills | Status: DC
  Filled 2023-02-26: qty 180, 90d supply, fill #0
  Filled 2023-05-29: qty 180, 90d supply, fill #1

## 2023-02-26 MED ORDER — OXYBUTYNIN CHLORIDE 5 MG PO TABS
5.0000 mg | ORAL_TABLET | Freq: Two times a day (BID) | ORAL | 1 refills | Status: DC
  Filled 2023-02-26: qty 180, 90d supply, fill #0
  Filled 2023-05-29: qty 180, 90d supply, fill #1

## 2023-02-26 MED ORDER — PRAVASTATIN SODIUM 40 MG PO TABS
40.0000 mg | ORAL_TABLET | Freq: Every day | ORAL | 1 refills | Status: DC
  Filled 2023-02-26: qty 90, 90d supply, fill #0
  Filled 2023-05-29: qty 90, 90d supply, fill #1

## 2023-02-26 MED ORDER — POTASSIUM CHLORIDE ER 10 MEQ PO TBCR
10.0000 meq | EXTENDED_RELEASE_TABLET | Freq: Three times a day (TID) | ORAL | 1 refills | Status: DC
  Filled 2023-02-26: qty 270, 90d supply, fill #0
  Filled 2023-05-29: qty 270, 90d supply, fill #1

## 2023-02-26 MED ORDER — DIAZEPAM 5 MG PO TABS
5.0000 mg | ORAL_TABLET | Freq: Two times a day (BID) | ORAL | 1 refills | Status: DC | PRN
  Filled 2023-02-26: qty 180, 90d supply, fill #0

## 2023-02-26 NOTE — Telephone Encounter (Signed)
Pt is requesting a refill on: potassium chloride (KLOR-CON) 10 MEQ tablet  pravastatin (PRAVACHOL) 40 MG tablet  oxybutynin (DITROPAN) 5 MG tablet  bumetanide (BUMEX) 1 MG tablet  levothyroxine (SYNTHROID) 50 MCG tablet  warfarin (COUMADIN) 2 MG tablet    baclofen (LIORESAL) 20 MG tablet   diazepam (VALIUM) 5 MG tablet   Pharmacy: [facility]   Pt is now using a [facility]

## 2023-02-26 NOTE — Telephone Encounter (Signed)
Refills on other medications have been sent. Script for Valium has been sent to provider for review.

## 2023-02-27 ENCOUNTER — Other Ambulatory Visit: Payer: Self-pay

## 2023-02-27 ENCOUNTER — Other Ambulatory Visit (HOSPITAL_COMMUNITY): Payer: Self-pay

## 2023-02-28 ENCOUNTER — Other Ambulatory Visit: Payer: Self-pay

## 2023-02-28 DIAGNOSIS — L89623 Pressure ulcer of left heel, stage 3: Secondary | ICD-10-CM | POA: Diagnosis not present

## 2023-03-01 DIAGNOSIS — R339 Retention of urine, unspecified: Secondary | ICD-10-CM | POA: Diagnosis not present

## 2023-03-02 ENCOUNTER — Other Ambulatory Visit (HOSPITAL_COMMUNITY): Payer: Self-pay

## 2023-03-07 ENCOUNTER — Telehealth: Payer: Self-pay | Admitting: *Deleted

## 2023-03-07 NOTE — Telephone Encounter (Signed)
Pt calling stating that the company that she has been using for her supplies is no longer in network.  She is requesting a order/referral to [facility] for her PT INR supplies.  She stated that she does these on her own and reports to the agency and then the agency informs provider.   I contacted [facility] and they are faxing a form for completion with the appropriate information.  Their contact information is below.  Also pt reported that her INR was 2.1 on [address].    [facility] Phone-[phone]

## 2023-03-07 NOTE — Telephone Encounter (Signed)
Form placed in Clinical box to complete and then route to PCP for order and signature.

## 2023-03-08 NOTE — Telephone Encounter (Signed)
Given to provider for review

## 2023-03-09 ENCOUNTER — Telehealth: Payer: Self-pay | Admitting: *Deleted

## 2023-03-09 ENCOUNTER — Other Ambulatory Visit (HOSPITAL_COMMUNITY): Payer: Self-pay

## 2023-03-09 ENCOUNTER — Other Ambulatory Visit: Payer: Self-pay | Admitting: Family Medicine

## 2023-03-09 DIAGNOSIS — G8250 Quadriplegia, unspecified: Secondary | ICD-10-CM

## 2023-03-09 DIAGNOSIS — B379 Candidiasis, unspecified: Secondary | ICD-10-CM

## 2023-03-09 MED ORDER — FLUCONAZOLE 200 MG PO TABS
200.0000 mg | ORAL_TABLET | Freq: Every day | ORAL | 1 refills | Status: DC
Start: 2023-03-09 — End: 2023-10-01

## 2023-03-09 MED ORDER — FLUCONAZOLE 200 MG PO TABS
200.0000 mg | ORAL_TABLET | Freq: Every day | ORAL | 1 refills | Status: DC
  Filled 2023-03-09: qty 1, 1d supply, fill #0

## 2023-03-09 NOTE — Telephone Encounter (Signed)
Diflucan does not come in 500 mg as an option, from what I could see in her medication list she has gotten either 200 mg or 150 mg in the past.  I did send in a prescription for 200 mg Diflucan to take care of of the yeast infection [facility] [place].

## 2023-03-09 NOTE — Telephone Encounter (Addendum)
Pt calling to check the status of the referral to [facility] and I informed her that the referral had been faxed to them.  She is also wanting to get some diflucan for yeast, she said she usually gets this every now and then.  She said that she usually gets 500mg  and we have beensending either 200 or 250.  Also she was inquiring about her warfarin Rx, I told her that it looked like it had been sent.  Please advise.  [facility] [place] on [address].

## 2023-03-12 ENCOUNTER — Other Ambulatory Visit (HOSPITAL_COMMUNITY): Payer: Self-pay

## 2023-03-12 ENCOUNTER — Ambulatory Visit: Payer: Medicare Other | Admitting: Family Medicine

## 2023-03-12 MED ORDER — WARFARIN SODIUM 2 MG PO TABS
ORAL_TABLET | ORAL | 5 refills | Status: DC
Start: 2023-03-12 — End: 2023-08-07
  Filled 2023-03-12: qty 70, 98d supply, fill #0
  Filled 2023-06-18: qty 70, 98d supply, fill #1
  Filled 2023-08-06: qty 70, 98d supply, fill #2

## 2023-03-12 NOTE — Addendum Note (Signed)
Addended by: Tonny Bollman on: 03/12/2023 09:41 AM   Modules accepted: Orders

## 2023-03-19 ENCOUNTER — Encounter: Payer: Self-pay | Admitting: Family Medicine

## 2023-03-19 ENCOUNTER — Ambulatory Visit (INDEPENDENT_AMBULATORY_CARE_PROVIDER_SITE_OTHER): Payer: HMO | Admitting: Family Medicine

## 2023-03-19 ENCOUNTER — Encounter (HOSPITAL_BASED_OUTPATIENT_CLINIC_OR_DEPARTMENT_OTHER): Payer: HMO | Attending: General Surgery | Admitting: General Surgery

## 2023-03-19 VITALS — BP 101/68 | HR 56 | Resp 18

## 2023-03-19 DIAGNOSIS — G8250 Quadriplegia, unspecified: Secondary | ICD-10-CM | POA: Diagnosis not present

## 2023-03-19 DIAGNOSIS — Z7901 Long term (current) use of anticoagulants: Secondary | ICD-10-CM | POA: Insufficient documentation

## 2023-03-19 DIAGNOSIS — L97828 Non-pressure chronic ulcer of other part of left lower leg with other specified severity: Secondary | ICD-10-CM | POA: Insufficient documentation

## 2023-03-19 DIAGNOSIS — L89623 Pressure ulcer of left heel, stage 3: Secondary | ICD-10-CM | POA: Insufficient documentation

## 2023-03-19 DIAGNOSIS — Z1211 Encounter for screening for malignant neoplasm of colon: Secondary | ICD-10-CM

## 2023-03-19 DIAGNOSIS — I959 Hypotension, unspecified: Secondary | ICD-10-CM

## 2023-03-19 DIAGNOSIS — F418 Other specified anxiety disorders: Secondary | ICD-10-CM | POA: Diagnosis not present

## 2023-03-19 DIAGNOSIS — L89524 Pressure ulcer of left ankle, stage 4: Secondary | ICD-10-CM | POA: Diagnosis not present

## 2023-03-19 NOTE — Patient Instructions (Signed)
I will check on the status of the referral for a colonoscopy and get that taken care of. I will also work on figuring out what is needed for the prior authorization.  Send me a MyChart message or give the office a call if you need anything in the meantime!

## 2023-03-19 NOTE — Progress Notes (Signed)
Established Patient Office Visit  Subjective   Patient ID: [name], female    DOB: Mar 01, 1956  Age: 67 y.o. MRN: [id number]  Chief Complaint  Patient presents with   Hypertension   Depression    HPI [name] is a 67 y.o. female presenting today for follow up of hypotension, mood.  She also would like to check on a referral for colonoscopy as she believes that 1 was made but she has not been contacted by anyone yet.  She would prefer a female provider.  She has been unhappy with the home health that was wanting to check her INR every week and will be researching a new 1.  She will let us know where we we will need to make the referral to.  She also has a list of wound care supplies that we will need to fill a prior authorization out for an order for Medicare to cover for them. Hypotension: Patient has been making efforts to maintain adequate hydration at home. Mood: Patient is here to follow up for situational anxiety, currently managing with 1/4 to 1/2 tablet of Xanax as needed. Taking medication without side effects, reports excellent compliance with treatment. Denies mood changes or SI/HI. She feels mood is stable since last visit.  She only has to take Xanax when she is going to be in a car.  Anxiety is well-managed with current regimen and patient feels that her mood is otherwise stable.     03/19/2023   10:49 AM 11/06/2022    9:48 AM 06/19/2022    8:23 AM  Depression screen PHQ 2/9  Decreased Interest 0 0 0  Down, Depressed, Hopeless 0 1 0  PHQ - 2 Score 0 1 0  Altered sleeping 3 3 0  Tired, decreased energy 3 3 0  Change in appetite 0 3 0  Feeling bad or failure about yourself  0 1 0  Trouble concentrating 0 0 0  Moving slowly or fidgety/restless 0 0 0  Suicidal thoughts  0 0  PHQ-9 Score 6 11 0  Difficult doing work/chores Not difficult at all Not difficult at all Not difficult at all       03/19/2023   10:49 AM 11/06/2022    9:50 AM 06/19/2022    8:23 AM   GAD 7 : Generalized Anxiety Score  Nervous, Anxious, on Edge 0 0 0  Control/stop worrying 3 3 0  Worry too much - different things 0 1 0  Trouble relaxing 0 0 0  Restless 0 0 0  Easily annoyed or irritable 0 1 0  Afraid - awful might happen 0 2 0  Total GAD 7 Score 3 7 0  Anxiety Difficulty Not difficult at all Somewhat difficult Not difficult at all   ROS Negative unless otherwise noted in HPI   Objective:     BP 101/68 (BP Location: Right Arm, Patient Position: Sitting, Cuff Size: Large)   Pulse (!) 56   Resp 18   SpO2 95%   Physical Exam Constitutional:      General: She is not in acute distress.    Appearance: Normal appearance.  HENT:     Head: Normocephalic and atraumatic.  Eyes:     Comments: Right eye exotropia  Cardiovascular:     Rate and Rhythm: Normal rate and regular rhythm.     Heart sounds: No murmur heard.    No friction rub. No gallop.  Pulmonary:     Effort: Pulmonary effort  is normal. No respiratory distress.     Breath sounds: No wheezing, rhonchi or rales.  Musculoskeletal:     Cervical back: Normal range of motion.  Skin:    General: Skin is warm and dry.  Neurological:     General: No focal deficit present.     Mental Status: She is alert and oriented to person, place, and time. Mental status is at baseline.  Psychiatric:        Mood and Affect: Mood normal.        Thought Content: Thought content normal.        Judgment: Judgment normal.     Assessment & Plan:  Hypotension, unspecified hypotension type Assessment & Plan: Asymptomatic, blood pressure today 101/68.  Stable, no need for medication at this time.  Will continue to monitor, encouraged to maintain adequate hydration and nutrition.   Situational anxiety Assessment & Plan: Situational anxiety related to riding in a car currently well-controlled.  Continue alprazolam 0.5 mg tablets as needed.  Patient can take 1/4 tablet up to a full tablet if needed.  Will continue to  monitor.   Screening for colorectal cancer -     Ambulatory referral to Gastroenterology  Our office will contact [facility] to discuss what steps need to take place in order to submit the prior authorization for medical supplies.  Return in about 6 months (around 09/18/2023) for flu shot and follow up appointment, fasting blood work 1 week before.   I spent 35 minutes on the day of the encounter to include pre-visit record review, face-to-face time with the patient and post visit referrals and record review.  Melida Quitter, PA

## 2023-03-19 NOTE — Assessment & Plan Note (Addendum)
Asymptomatic, blood pressure today 101/68.  Stable, no need for medication at this time.  Will continue to monitor, encouraged to maintain adequate hydration and nutrition.

## 2023-03-19 NOTE — Assessment & Plan Note (Signed)
Situational anxiety related to riding in a car currently well-controlled.  Continue alprazolam 0.5 mg tablets as needed.  Patient can take 1/4 tablet up to a full tablet if needed.  Will continue to monitor.

## 2023-03-20 NOTE — Progress Notes (Signed)
[name], [name] ([id number]) [id number] Page 1 of 9 Visit Report for 03/19/2023 Arrival Information Details Patient Name: Date of Service: [place], [name] 03/19/2023 1:15 PM Medical Record Number: [id number] Patient Account Number: [id number] Date of Birth/Sex: Treating RN: 02/18/1956 (67 [name]) Primary Care [name]: [name] Other Clinician: Referring Safa Derner: Treating Jenniger Figiel/Extender: Jacklynn [place]ong, [place] RGA Walker Weeks in Treatment: 55 Visit Information History Since Last Visit All ordered tests and consults were completed: No Patient Arrived: Wheel Chair Added or deleted any medications: No Arrival Time: 13:41 Any new allergies or adverse reactions: No Accompanied By: self Had a fall or experienced change in No Transfer Assistance: None activities of daily living that Angela affect Patient Identification Verified: Yes risk of falls: Secondary Verification Process Completed: Yes Signs or symptoms of abuse/neglect since last visito No Patient Requires Transmission-Based Precautions: No Hospitalized since last visit: No Patient Has Alerts: Yes Implantable device outside of the clinic excluding No Patient Alerts: Patient on Blood Thinner cellular tissue based products placed in the center Coumadin since last visit: Has Dressing in Place as Prescribed: Yes Has Compression in Place as Prescribed: Yes Pain Present Now: No Electronic Signature(s) Signed: 03/19/2023 5:20:38 PM By: Zenaida Deed RN, BSN Entered By: Zenaida Deed on 03/19/2023 13:49:32 -------------------------------------------------------------------------------- Encounter Discharge Information Details Patient Name: Date of Service: [name], Angela RTHA Walker. 03/19/2023 1:15 PM Medical Record Number: [id number] Patient Account Number: [id number] Date of Birth/Sex: Treating RN: February 09, 1956 (77 [name] [name] Primary Care Oaklen Thiam: [name], [place] [facility] Walker Other  Clinician: Referring Zayne Draheim: Treating Jaryn Hocutt/Extender: Jacklynn [place], [place] RGA Walker Weeks in Treatment: 22 Encounter Discharge Information Items Post Procedure Vitals Discharge Condition: Stable Temperature (F): 97.8 Ambulatory Status: Wheelchair Pulse (bpm): 75 Discharge Destination: Home Respiratory Rate (breaths/min): 18 Transportation: Private Auto Blood Pressure (mmHg): 97/61 Accompanied By: daughter Schedule Follow-up Appointment: Yes Clinical Summary of Care: Patient Declined Electronic Signature(s) Signed: 03/19/2023 5:20:38 PM By: Zenaida Deed RN, BSN Entered By: Zenaida Deed on 03/19/2023 14:25:37 -------------------------------------------------------------------------------- Lower Extremity Assessment Details Patient Name: Date of Service: [facility], [place] RTHA Walker. 03/19/2023 1:15 PM Medical Record Number: [id number] Patient Account Number: [id number] Date of Birth/Sex: Treating RN: [birth year] (48 [name] [name] Primary Care Albena Comes: [name], [place] [facility] Walker Other Clinician: Referring Riot Barrick: Treating Wakeelah Solan/Extender: Jacklynn [place], [place] RGA Walker Weeks in Treatment: 61 Edema Assessment Left: [Left: Right] [Right: :] Assessed: [Left: No] [Right: No] Edema: [Left: Ye] [Right: s] Calf Left: Right: Point of Measurement: 31 cm From Medial Instep 22.5 cm Ankle Left: Right: Point of Measurement: 7 cm From Medial Instep 20.5 cm Vascular Assessment Pulses: Dorsalis Pedis Palpable: [Left:Yes] Electronic Signature(s) Signed: 03/19/2023 5:20:38 PM By: Zenaida Deed RN, BSN Entered By: Zenaida Deed on 03/19/2023 13:50:57 -------------------------------------------------------------------------------- Multi Wound Chart Details Patient Name: Date of Service: [name], Angela RTHA Walker. 03/19/2023 1:15 PM Medical Record Number: [id number] Patient Account Number: [id number] Date of Birth/Sex: Treating RN: 12/27/1955 (10 [name]) Primary Care Angie Piercey:  [name], [place] [facility] Walker Other Clinician: Referring Shree Espey: Treating Trust Crago/Extender: Jacklynn [place], [place] RGA Walker Weeks in Treatment: 74 Vital Signs Height(in): 64 Pulse(bpm): 75 Weight(lbs): 175 Blood Pressure(mmHg): 97/61 Body Mass Index(BMI): 30 Temperature(F): 97.8 Respiratory Rate(breaths/min): 18 [22:Photos:] [Walker/A:Walker/A] Left Calcaneus Left, Posterior Ankle Walker/A Wound Location: Pressure Injury Pressure Injury Walker/A Wounding Event: Pressure Ulcer Pressure Ulcer Walker/A Primary Etiology: Cataracts, Hypotension, Neuropathy, Cataracts, Hypotension, Neuropathy, Walker/A Comorbid History: Quadriplegia Quadriplegia 11/03/2021 11/03/2021 Walker/A Date  Acquired: 61 61 Walker/A Weeks of Treatment: Open Open Walker/A Wound Status: No No Walker/A Wound Recurrence: 1.8x2.8x0.1 1.5x0.8x0.3 Walker/A Measurements L x W x D (cm) 3.958 0.942 Walker/A A (cm) : rea 0.396 0.283 Walker/A Volume (cm) : 37.00% 77.80% Walker/A % Reduction in A rea: 79.00% 83.30% Walker/A % Reduction in Volume: Category/Stage III Category/Stage IV Walker/A Classification: Medium Medium Walker/A Exudate A mount: Serosanguineous Serosanguineous Walker/A Exudate Type: red, brown red, brown Walker/A Exudate Color: Distinct, outline attached Flat and Intact Walker/A Wound Margin: Small (1-33%) Large (67-100%) Walker/A Granulation A mount: Red, Pink Red Walker/A Granulation Quality: Large (67-100%) Small (1-33%) Walker/A Necrotic A mount: Fat Layer (Subcutaneous Tissue): Yes Fat Layer (Subcutaneous Tissue): Yes Walker/A Exposed StructuresZAVIA, [name] ([id number]) [id number] Page 3 of 9 Fascia: No Fascia: No Tendon: No Tendon: No Muscle: No Muscle: No Joint: No Joint: No Bone: No Bone: No Small (1-33%) Small (1-33%) Walker/A Epithelialization: Debridement - Excisional Debridement - Excisional Walker/A Debridement: Pre-procedure Verification/Time Out 14:05 14:05 Walker/A Taken: Subcutaneous, Slough Subcutaneous, Slough Walker/A Tissue Debrided: Skin/Subcutaneous  Tissue Skin/Subcutaneous Tissue Walker/A Level: 5.04 1.2 Walker/A Debridement A (sq cm): rea Curette Curette Walker/A Instrument: Minimum Minimum Walker/A Bleeding: Pressure Pressure Walker/A Hemostasis A chieved: Insensate Insensate Walker/A Procedural Pain: Insensate Insensate Walker/A Post Procedural Pain: Procedure was tolerated well Procedure was tolerated well Walker/A Debridement Treatment Response: 1.8x2.8x0.1 1.5x0.8x0.3 Walker/A Post Debridement Measurements L x W x D (cm) 0.396 0.283 Walker/A Post Debridement Volume: (cm) Category/Stage III Category/Stage IV Walker/A Post Debridement Stage: No Abnormalities Noted No Abnormalities Noted Walker/A Periwound Skin Texture: No Abnormalities Noted No Abnormalities Noted Walker/A Periwound Skin Moisture: No Abnormalities Noted No Abnormalities Noted Walker/A Periwound Skin Color: No Abnormality No Abnormality Walker/A Temperature: Debridement Debridement Walker/A Procedures Performed: Treatment Notes Wound #22 (Calcaneus) Wound Laterality: Left Cleanser Normal Saline Discharge Instruction: Cleanse the wound with Normal Saline prior to applying a clean dressing using gauze sponges, not tissue or cotton balls. Soap and Water Discharge Instruction: Angela shower and wash wound with dial antibacterial soap and water prior to dressing change. Wound Cleanser Discharge Instruction: Cleanse the wound with wound cleanser prior to applying a clean dressing using gauze sponges, not tissue or cotton balls. Peri-Wound Care Topical Primary Dressing Hydrofera Blue Classic Foam, 4x4 in Discharge Instruction: Moisten with saline prior to applying to wound bed Santyl Ointment Discharge Instruction: Apply nickel thick amount to wound bed as instructed Secondary Dressing Zetuvit Plus Silicone Border Dressing 5x5 (in/in) Discharge Instruction: Apply silicone border over primary dressing as directed. Secured With 19M Medipore H Soft Cloth Surgical T ape, 4 x 10 (in/yd) Discharge Instruction: Secure with tape as  needed Compression Wrap Compression Stockings Add-Ons Wound #23 (Ankle) Wound Laterality: Left, Posterior Cleanser Normal Saline Discharge Instruction: Cleanse the wound with Normal Saline prior to applying a clean dressing using gauze sponges, not tissue or cotton balls. Soap and Water Discharge Instruction: Angela shower and wash wound with dial antibacterial soap and water prior to dressing change. Wound Cleanser Discharge Instruction: Cleanse the wound with wound cleanser prior to applying a clean dressing using gauze sponges, not tissue or cotton balls. Peri-Wound Care Topical [place] ([id number]) [id number] Page 4 of 9 Primary Dressing Hydrofera Blue Classic Foam, 4x4 in Discharge Instruction: Moisten with saline prior to applying to wound bed Santyl Ointment Discharge Instruction: Apply nickel thick amount to wound bed as instructed Secondary Dressing Zetuvit Plus Silicone Border Dressing 5x5 (in/in) Discharge Instruction: Apply silicone border over primary dressing as directed. Secured With [employer]  Medipore H Soft Cloth Surgical T ape, 4 x 10 (in/yd) Discharge Instruction: Secure with tape as needed Compression Wrap Compression Stockings Add-Ons Electronic Signature(s) Signed: 03/19/2023 2:31:05 PM By: Duanne Guess MD FACS Entered By: Duanne Guess on 03/19/2023 14:31:05 -------------------------------------------------------------------------------- Multi-Disciplinary Care Plan Details Patient Name: Date of Service: [name], Angela RTHA Walker. 03/19/2023 1:15 PM Medical Record Number: [id number] Patient Account Number: [id number] Date of Birth/Sex: Treating RN: October 10, 1956 (37 [name] [name] Primary Care Talisa Petrak: [name], [place] [facility] Walker Other Clinician: Referring Hughey Rittenberry: Treating Darin Arndt/Extender: Jacklynn [place], [place] RGA Walker Weeks in Treatment: 82 Multidisciplinary Care Plan reviewed with physician Active  Inactive Pressure Nursing Diagnoses: Knowledge deficit related to causes and risk factors for pressure ulcer development Knowledge deficit related to management of pressures ulcers Potential for impaired tissue integrity related to pressure, friction, moisture, and shear Goals: Patient/caregiver will verbalize understanding of pressure ulcer management Date Initiated: 03/20/2022 Target Resolution Date: 04/02/2023 Goal Status: Active Interventions: Assess: immobility, friction, shearing, incontinence upon admission and as needed Assess offloading mechanisms upon admission and as needed Assess potential for pressure ulcer upon admission and as needed Notes: Wound/Skin Impairment Nursing Diagnoses: Impaired tissue integrity Knowledge deficit related to smoking impact on wound healing Goals: Patient/caregiver will verbalize understanding of skin care regimen Date Initiated: 01/12/2022 Target Resolution Date: 04/02/2023 Goal Status: Active Interventions: Assess patient/caregiver ability to obtain necessary supplies [place] ([id number]) [phone].pdf Page 5 of 9 Assess patient/caregiver ability to perform ulcer/skin care regimen upon admission and as needed Assess ulceration(s) every visit Provide education on smoking Provide education on ulcer and skin care Notes: Electronic Signature(s) Signed: 03/19/2023 5:20:38 PM By: Zenaida Deed RN, BSN Entered By: Zenaida Deed on 03/19/2023 13:59:48 -------------------------------------------------------------------------------- Pain Assessment Details Patient Name: Date of Service: [name], Angela RTHA Walker. 03/19/2023 1:15 PM Medical Record Number: [id number] Patient Account Number: [id number] Date of Birth/Sex: Treating RN: 03-04-1956 (82 [name]) Primary Care Ameli Sangiovanni: [name], [place] [facility] Walker Other Clinician: Referring Linday Rhodes: Treating Rondall Radigan/Extender: Jacklynn [place], [place] RGA Walker Weeks in Treatment:  68 Active Problems Location of Pain Severity and Description of Pain Patient Has Paino No Site Locations Rate the pain. Current Pain Level: 0 Pain Management and Medication Current Pain Management: Electronic Signature(s) Signed: 03/19/2023 5:20:38 PM By: Zenaida Deed RN, BSN Entered By: Zenaida Deed on 03/19/2023 13:49:45 -------------------------------------------------------------------------------- Patient/Caregiver Education Details Patient Name: Date of Service: [name], Angela Rosezena Sensor 4/15/2024andnbsp1:15 PM Medical Record Number: [id number] Patient Account Number: [id number] Date of Birth/Gender: Treating RN: 1955-12-07 (80 [name] [name] Primary Care Physician: [name], [place] [facility] Walker Other Clinician: Referring Physician: Treating Physician/Extender: Jacklynn [place], [place] RGA Walker Weeks in Treatment: 17 Education Assessment Education Provided To: Patient Education Topics Provided [place] ([id number]) [id number] Page 6 of 9 Pressure: Methods: Explain/Verbal Responses: Reinforcements needed, State content correctly Wound/Skin Impairment: Methods: Explain/Verbal Responses: Reinforcements needed, State content correctly Electronic Signature(s) Signed: 03/19/2023 5:20:38 PM By: Zenaida Deed RN, BSN Entered By: Zenaida Deed on 03/19/2023 14:00:32 -------------------------------------------------------------------------------- Wound Assessment Details Patient Name: Date of Service: [name], Angela RTHA Walker. 03/19/2023 1:15 PM Medical Record Number: [id number] Patient Account Number: [id number] Date of Birth/Sex: Treating RN: 10-18-1956 (35 [name] [name] Primary Care Marchele Decock: [name], [place] [facility] Walker Other Clinician: Referring Hughes Wyndham: Treating Atiyana Welte/Extender: Jacklynn [place], [place] RGA Walker Weeks in Treatment: 58 Wound Status Wound Number: 22 Primary Etiology: Pressure Ulcer Wound Location: Left  Calcaneus Wound Status: Open Wounding Event: Pressure Injury  Comorbid History: Cataracts, Hypotension, Neuropathy, Quadriplegia Date Acquired: 11/03/2021 Weeks Of Treatment: 61 Clustered Wound: No Photos Wound Measurements Length: (cm) 1.8 Width: (cm) 2.8 Depth: (cm) 0.1 Area: (cm) 3.958 Volume: (cm) 0.396 % Reduction in Area: 37% % Reduction in Volume: 79% Epithelialization: Small (1-33%) Tunneling: No Undermining: No Wound Description Classification: Category/Stage III Wound Margin: Distinct, outline attached Exudate Amount: Medium Exudate Type: Serosanguineous Exudate Color: red, brown Foul Odor After Cleansing: No Slough/Fibrino Yes Wound Bed Granulation Amount: Small (1-33%) Exposed Structure Granulation Quality: Red, Pink Fascia Exposed: No Necrotic Amount: Large (67-100%) Fat Layer (Subcutaneous Tissue) Exposed: Yes Necrotic Quality: Adherent Slough Tendon Exposed: No Muscle Exposed: No Joint Exposed: No Bone Exposed: No [id number] ([id number]) [id number] Page 7 of 9 No Abnormalities Noted: Yes No Abnormalities Noted: Yes Moisture Temperature / Pain No Abnormalities Noted: Yes Temperature: No Abnormality Treatment Notes Wound #22 (Calcaneus) Wound Laterality: Left Cleanser Normal Saline Discharge Instruction: Cleanse the wound with Normal Saline prior to applying a clean dressing using gauze sponges, not tissue or cotton balls. Soap and Water Discharge Instruction: Angela shower and wash wound with dial antibacterial soap and water prior to dressing change. Wound Cleanser Discharge Instruction: Cleanse the wound with wound cleanser prior to applying a clean dressing using gauze sponges, not tissue or cotton balls. Peri-Wound Care Topical Primary Dressing Hydrofera Blue Classic Foam, 4x4 in Discharge Instruction: Moisten with saline prior to applying to wound bed Santyl  Ointment Discharge Instruction: Apply nickel thick amount to wound bed as instructed Secondary Dressing Zetuvit Plus Silicone Border Dressing 5x5 (in/in) Discharge Instruction: Apply silicone border over primary dressing as directed. Secured With 61M Medipore H Soft Cloth Surgical T ape, 4 x 10 (in/yd) Discharge Instruction: Secure with tape as needed Compression Wrap Compression Stockings Add-Ons Electronic Signature(s) Signed: 03/19/2023 5:20:38 PM By: Zenaida Deed RN, BSN Entered By: Zenaida Deed on 03/19/2023 13:59:01 -------------------------------------------------------------------------------- Wound Assessment Details Patient Name: Date of Service: [name], Angela RTHA Walker. 03/19/2023 1:15 PM Medical Record Number: [id number] Patient Account Number: [id number] Date of Birth/Sex: Treating RN: 21-May-1956 (42 [name] [name] Primary Care Arvil Utz: [name], [place] [facility] Walker Other Clinician: Referring Houa Nie: Treating Zuhayr Deeney/Extender: Jacklynn Ganong, [place] RGA Walker Weeks in Treatment: 55 Wound Status Wound Number: 23 Primary Etiology: Pressure Ulcer Wound Location: Left, Posterior Ankle Wound Status: Open Wounding Event: Pressure Injury Comorbid History: Cataracts, Hypotension, Neuropathy, Quadriplegia Date Acquired: 11/03/2021 Weeks Of Treatment: 61 Clustered Wound: No Photos [name], [name] ([id number]) [phone].pdf Page 8 of 9 Wound Measurements Length: (cm) 1.5 Width: (cm) 0.8 Depth: (cm) 0.3 Area: (cm) 0.942 Volume: (cm) 0.283 % Reduction in Area: 77.8% % Reduction in Volume: 83.3% Epithelialization: Small (1-33%) Tunneling: No Undermining: No Wound Description Classification: Category/Stage IV Wound Margin: Flat and Intact Exudate Amount: Medium Exudate Type: Serosanguineous Exudate Color: red, brown Foul Odor After Cleansing: No Slough/Fibrino Yes Wound Bed Granulation Amount: Large (67-100%) Exposed  Structure Granulation Quality: Red Fascia Exposed: No Necrotic Amount: Small (1-33%) Fat Layer (Subcutaneous Tissue) Exposed: Yes Necrotic Quality: Adherent Slough Tendon Exposed: No Muscle Exposed: No Joint Exposed: No Bone Exposed: No Periwound Skin Texture Texture Color No Abnormalities Noted: Yes No Abnormalities Noted: Yes Moisture Temperature / Pain No Abnormalities Noted: Yes Temperature: No Abnormality Treatment Notes Wound #23 (Ankle) Wound Laterality: Left, Posterior Cleanser Normal Saline Discharge Instruction: Cleanse the wound with Normal Saline prior to applying a clean dressing using gauze sponges, not tissue or cotton balls. Soap and Water Discharge  Instruction: Angela shower and wash wound with dial antibacterial soap and water prior to dressing change. Wound Cleanser Discharge Instruction: Cleanse the wound with wound cleanser prior to applying a clean dressing using gauze sponges, not tissue or cotton balls. Peri-Wound Care Topical Primary Dressing Hydrofera Blue Classic Foam, 4x4 in Discharge Instruction: Moisten with saline prior to applying to wound bed Santyl Ointment Discharge Instruction: Apply nickel thick amount to wound bed as instructed Secondary Dressing Zetuvit Plus Silicone Border Dressing 5x5 (in/in) Discharge Instruction: Apply silicone border over primary dressing as directed. Secured With 64M Medipore H Soft Cloth Surgical T ape, 4 x 10 (in/yd) Discharge Instruction: Secure with tape as needed Compression [name], [name] ([id number]) [id number] Page 9 of 9 Compression Stockings Add-Ons Electronic Signature(s) Signed: 03/19/2023 5:20:38 PM By: Zenaida Deed RN, BSN Entered By: Zenaida Deed on 03/19/2023 13:59:29 -------------------------------------------------------------------------------- Vitals Details Patient Name: Date of Service: [name], Angela RTHA Walker. 03/19/2023 1:15 PM Medical Record Number:  [id number] Patient Account Number: [id number] Date of Birth/Sex: Treating RN: 19-Jun-1956 (40 [name]) Primary Care Glen Kesinger: [name], [place] [facility] Walker Other Clinician: Referring Jaleil Renwick: Treating Gianno Volner/Extender: Jacklynn [place]ong, [place] RGA Walker Weeks in Treatment: 66 Vital Signs Time Taken: 01:42 Temperature (F): 97.8 Height (in): 64 Pulse (bpm): 75 Weight (lbs): 175 Respiratory Rate (breaths/min): 18 Body Mass Index (BMI): 30 Blood Pressure (mmHg): 97/61 Reference Range: 80 - 120 mg / dl Electronic Signature(s) Signed: 03/19/2023 5:20:38 PM By: Zenaida Deed RN, BSN Entered By: Zenaida Deed on 03/19/2023 13:49:38
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
## 2023-03-29 DIAGNOSIS — L89623 Pressure ulcer of left heel, stage 3: Secondary | ICD-10-CM | POA: Diagnosis not present

## 2023-03-30 DIAGNOSIS — R339 Retention of urine, unspecified: Secondary | ICD-10-CM | POA: Diagnosis not present

## 2023-04-02 DIAGNOSIS — R339 Retention of urine, unspecified: Secondary | ICD-10-CM | POA: Diagnosis not present

## 2023-04-09 ENCOUNTER — Encounter (HOSPITAL_BASED_OUTPATIENT_CLINIC_OR_DEPARTMENT_OTHER): Payer: HMO | Attending: General Surgery | Admitting: General Surgery

## 2023-04-09 DIAGNOSIS — S14109S Unspecified injury at unspecified level of cervical spinal cord, sequela: Secondary | ICD-10-CM | POA: Insufficient documentation

## 2023-04-09 DIAGNOSIS — L89623 Pressure ulcer of left heel, stage 3: Secondary | ICD-10-CM | POA: Insufficient documentation

## 2023-04-09 DIAGNOSIS — X58XXXS Exposure to other specified factors, sequela: Secondary | ICD-10-CM | POA: Insufficient documentation

## 2023-04-09 DIAGNOSIS — L97828 Non-pressure chronic ulcer of other part of left lower leg with other specified severity: Secondary | ICD-10-CM | POA: Insufficient documentation

## 2023-04-09 DIAGNOSIS — L89524 Pressure ulcer of left ankle, stage 4: Secondary | ICD-10-CM | POA: Diagnosis not present

## 2023-04-09 LAB — POCT INR: INR: 3.2 — AB (ref 0.80–1.20)
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
## 2023-04-10 NOTE — Progress Notes (Addendum)
[place], [place] Y ([id number]) [id number] Page 1 of 7 Visit Report for 04/09/2023 Arrival Information Details Patient Name: Date of Service: [place], [name] 04/09/2023 11:15 A M Medical Record Number: [id number] Patient Account Number: [id number] Date of Birth/Sex: Treating RN: 1956-03-26 (67 y.o. [name] Primary Care Jaqueline Uber: [place]rel [place], [place] [facility] N Other Clinician: Referring Harriette Tovey: Treating Betzalel Umbarger/Extender: Jacklynn [place], [place] [facility] N Weeks in Treatment: 74 Visit Information History Since Last Visit Added or deleted any medications: No Patient Arrived: Wheel Chair Any new allergies or adverse reactions: No Arrival Time: 11:53 Had a fall or experienced change in No Accompanied By: sister activities of daily living that may affect Transfer Assistance: None risk of falls: Patient Requires Transmission-Based Precautions: No Signs or symptoms of abuse/neglect since last visito No Patient Has Alerts: Yes Hospitalized since last visit: No Patient Alerts: Patient on Blood Thinner Implantable device outside of the clinic excluding No Coumadin cellular tissue based products placed in the center since last visit: Has Dressing in Place as Prescribed: Yes Has Compression in Place as Prescribed: Yes Pain Present Now: No Notes stays in wheelchair Electronic Signature(s) Signed: 04/09/2023 5:06:07 PM By: Zenaida Deed RN, BSN Entered By: Zenaida Deed on 04/09/2023 11:54:29 -------------------------------------------------------------------------------- Encounter Discharge Information Details Patient Name: Date of Service: [name], [name] 04/09/2023 11:15 A M Medical Record Number: [id number] Patient Account Number: [id number] Date of Birth/Sex: Treating RN: Nov 12, 1956 (67 y.o. [name] Primary Care Alacia Rehmann: Jerel [place], [place] [facility] N Other Clinician: Referring Brizza Nathanson: Treating Amiliana Foutz/Extender: Jacklynn [place], [place] [facility]  N Weeks in Treatment: 65 Encounter Discharge Information Items Post Procedure Vitals Discharge Condition: Stable Temperature (F): 97.8 Ambulatory Status: Wheelchair Pulse (bpm): 62 Discharge Destination: Home Respiratory Rate (breaths/min): 18 Transportation: Private Auto Blood Pressure (mmHg): 102/35 Accompanied By: sister Schedule Follow-up Appointment: Yes Clinical Summary of Care: Patient Declined Electronic Signature(s) Signed: 04/09/2023 5:06:07 PM By: Zenaida Deed RN, BSN Entered By: Zenaida Deed on 04/09/2023 12:35:52 [place], [place] Y ([id number]) [phone].pdf Page 2 of 7 -------------------------------------------------------------------------------- Lower Extremity Assessment Details Patient Name: Date of Service: [name], [name] 04/09/2023 11:15 A M Medical Record Number: [id number] Patient Account Number: [id number] Date of Birth/Sex: Treating RN: 09-May-1956 (67 y.o. [name] Primary Care Aishi Courts: Jerel [place], [place] [facility] N Other Clinician: Referring Londyn Hotard: Treating Fawaz Borquez/Extender: Jacklynn [place], [place] R[facility] N Weeks in Treatment: 64 Edema Assessment Assessed: [Left: No] [Right: No] Edema: [Left: Ye] [Right: s] Calf Left: Right: Point of Measurement: 31 cm From Medial Instep 22.5 cm Ankle Left: Right: Point of Measurement: 7 cm From Medial Instep 20.5 cm Vascular Assessment Pulses: Dorsalis Pedis Palpable: [Left:Yes] Electronic Signature(s) Signed: 04/09/2023 5:06:07 PM By: Zenaida Deed RN, BSN Entered By: Zenaida Deed on 04/09/2023 12:00:00 -------------------------------------------------------------------------------- Multi Wound Chart Details Patient Name: Date of Service: [name], [name] 04/09/2023 11:15 A M Medical Record Number: [id number] Patient Account Number: [id number] Date of Birth/Sex: Treating RN: Aug 11, 1956 (67 y.o. F) Primary Care Deshunda Thackston: Jerel [place], [place] [facility] N Other Clinician: Referring  Myliyah Rebuck: Treating Cruzito Standre/Extender: Jacklynn [place], [place] [facility] N Weeks in Treatment: 29 Vital Signs Height(in): 64 Pulse(bpm): 62 Weight(lbs): 175 Blood Pressure(mmHg): 102/35 Body Mass Index(BMI): 30 Temperature(F): 97.8 Respiratory Rate(breaths/min): 18 [22:Photos: No Photos Left Calcaneus Wound Location: Pressure Injury Wounding Event: Pressure Ulcer Primary Etiology: Cataracts, Hypotension, Neuropathy, Comorbid History: Quadriplegia 11/03/2021 Date Acquired: 64 Weeks of Treatment: Open Wound Status:] [23:No Photos  Left, Posterior Ankle Pressure Injury Pressure Ulcer Cataracts,  Hypotension, Neuropathy, Quadriplegia 11/03/2021 63 Open] [N/A:N/A N/A N/A N/A N/A N/A N/A N/A] CHANTHA, CROZIER ([id number]) [22:No Wound Recurrence: 1.5x3.5x0.1 Measurements L x W x D (cm) 4.123 A (cm) : rea 0.412 Volume (cm) : 34.40% % Reduction in A rea: 78.10% % Reduction in Volume: Category/Stage III Classification: Medium Exudate A mount: Serosanguineous Exudate Type:  red, brown Exudate Color: Distinct, outline attached Wound Margin: Medium (34-66%) Granulation A mount: Red, Pink Granulation Quality: Medium (34-66%) Necrotic A mount: Fat Layer (Subcutaneous Tissue): Yes Fat Layer (Subcutaneous Tissue): Yes N/A Exposed  Structures: Fascia: No Tendon: No Muscle: No Joint: No Bone: No Small (1-33%) Epithelialization: Debridement - Excisional Debridement: Pre-procedure Verification/Time Out 12:10 Taken: Subcutaneous, Slough Tissue Debrided: Skin/Subcutaneous Tissue Level:  4.12 Debridement A (sq cm): rea Curette Instrument: Minimum Bleeding: Pressure Hemostasis A chieved: Insensate Procedural Pain: Insensate Post Procedural Pain: Procedure was tolerated well Debridement Treatment Response: 1.5x3.5x0.1 Post Debridement  Measurements L x W x D (cm) 0.412 Post Debridement Volume: (cm) Category/Stage III Post Debridement Stage: No Abnormalities Noted Periwound Skin Texture: No Abnormalities Noted Periwound Skin  Moisture: No Abnormalities Noted Periwound Skin Color: No  Abnormality Temperature: Debridement Procedures Performed:] [23:No 1x0.5x0.6 0.393 0.236 90.70% 86.10% Category/Stage IV Medium Serosanguineous red, brown Flat and Intact Large (67-100%) Red Small (1-33%) Fascia: No Tendon: No Muscle: No Joint: No Bone:  No Small (1-33%) Debridement - Selective/Open Wound N/A 12:10 Slough Non-Viable Tissue 0.59 Curette Minimum Pressure Insensate Insensate Procedure was tolerated well 1.5x0.5x0.6 0.353 Category/Stage IV No Abnormalities Noted No Abnormalities Noted No  Abnormalities Noted No Abnormality Debridement] [N/A:[id number] Page 3 of 7 N/A N/A N/A N/A N/A N/A N/A N/A N/A N/A N/A N/A N/A N/A N/A N/A N/A N/A N/A N/A N/A N/A N/A N/A N/A N/A N/A N/A N/A N/A N/A N/A N/A] Treatment Notes Electronic Signature(s) Signed: 04/09/2023 12:30:21 PM By: Duanne Guess MD FACS Entered By: Duanne Guess on 04/09/2023 12:30:21 -------------------------------------------------------------------------------- Multi-Disciplinary Care Plan Details Patient Name: Date of Service: [name], [name] 04/09/2023 11:15 A M Medical Record Number: [id number] Patient Account Number: [id number] Date of Birth/Sex: Treating RN: 28-Jul-1956 (67 y.o. [name] Primary Care Ellie Bryand: Jerel [place], [place] [facility] N Other Clinician: Referring Wanita Derenzo: Treating Jolynda Townley/Extender: Jacklynn [place], [place] [facility] N Weeks in Treatment: 14 Multidisciplinary Care Plan reviewed with physician Active Inactive Electronic Signature(s) Signed: 06/06/2023 9:43:53 AM By: Shawn Stall RN, BSN Signed: 06/12/2023 5:04:57 PM By: Zenaida Deed RN, BSN Previous Signature: 04/09/2023 5:06:07 PM Version By: Zenaida Deed RN, BSN Entered By: Shawn Stall on 06/06/2023 09:43:52 [place], [place] Y ([id number]) [phone].pdf Page 4 of  7 -------------------------------------------------------------------------------- Pain Assessment Details Patient Name: Date of Service: [name], [name] 04/09/2023 11:15 A M Medical Record Number: [id number] Patient Account Number: [id number] Date of Birth/Sex: Treating RN: 1956/07/05 (67 y.o. [name] Primary Care Mohmmad Saleeby: Jerel [place], [place] [facility] N Other Clinician: Referring Turner Baillie: Treating Tanay Massiah/Extender: Jacklynn [place], [place]GA N Weeks in Treatment: 24 Active Problems Location of Pain Severity and Description of Pain Patient Has Paino No Site Locations Rate the pain. Current Pain Level: 0 Pain Management and Medication Current Pain Management: Electronic Signature(s) Signed: 04/09/2023 5:06:07 PM By: Zenaida Deed RN, BSN Entered By: Zenaida Deed on 04/09/2023 11:55:04 -------------------------------------------------------------------------------- Patient/Caregiver Education Details Patient Name: Date of Service: [name], [place] Rosezena Sensor 5/6/2024andnbsp11:15 A M Medical Record Number: [id number] Patient Account Number: [id number] Date of Birth/Gender: Treating RN: 07/14/1956 (67 y.o. [name] Primary Care Physician: Jerel [place], [place]  [facility] N Other Clinician: Referring Physician: Treating Physician/Extender: Jacklynn [place], [place]GA N Weeks in Treatment: 74 Education Assessment Education Provided To: Patient Education Topics Provided Pressure: Methods: Explain/Verbal Responses: Reinforcements needed, State content correctly [place] ([id number]) [id number] Page 5 of 7 Wound/Skin Impairment: Methods: Explain/Verbal Responses: Reinforcements needed, State content correctly Electronic Signature(s) Signed: 04/09/2023 5:06:07 PM By: Zenaida Deed RN, BSN Entered By: Zenaida Deed on 04/09/2023 12:08:13 -------------------------------------------------------------------------------- Wound Assessment  Details Patient Name: Date of Service: [name], [name] 04/09/2023 11:15 A M Medical Record Number: [id number] Patient Account Number: [id number] Date of Birth/Sex: Treating RN: 1956/11/16 (67 y.o. [name] Primary Care Lacharles Altschuler: Jerel [place], [place] [facility] N Other Clinician: Referring Rheta Hemmelgarn: Treating Anirudh Baiz/Extender: Jacklynn [place], [place] RGA N Weeks in Treatment: 64 Wound Status Wound Number: [id number] Primary Etiology: Pressure Ulcer Wound Location: Left Calcaneus Wound Status: Open Wounding Event: Pressure Injury Comorbid History: Cataracts, Hypotension, Neuropathy, Quadriplegia Date Acquired: 11/03/2021 Weeks Of Treatment: 64 Clustered Wound: No Photos Wound Measurements Length: (cm) 1.5 Width: (cm) 3.5 Depth: (cm) 0.1 Area: (cm) 4.123 Volume: (cm) 0.412 % Reduction in Area: 34.4% % Reduction in Volume: 78.1% Epithelialization: Small (1-33%) Tunneling: No Undermining: No Wound Description Classification: Category/Stage III Wound Margin: Distinct, outline attached Exudate Amount: Medium Exudate Type: Serosanguineous Exudate Color: red, brown Foul Odor After Cleansing: No Slough/Fibrino Yes Wound Bed Granulation Amount: Medium (34-66%) Exposed Structure Granulation Quality: Red, Pink Fascia Exposed: No Necrotic Amount: Medium (34-66%) Fat Layer (Subcutaneous Tissue) Exposed: Yes Necrotic Quality: Adherent Slough Tendon Exposed: No Muscle Exposed: No Joint Exposed: No Bone Exposed: No [address] ([id number]) [id number] Page 6 of 7 No Abnormalities Noted: Yes No Abnormalities Noted: Yes Moisture Temperature / Pain No Abnormalities Noted: Yes Temperature: No Abnormality Electronic Signature(s) Signed: 04/09/2023 5:06:07 PM By: Zenaida Deed RN, BSN Entered By: Zenaida Deed on 04/09/2023  16:18:17 -------------------------------------------------------------------------------- Wound Assessment Details Patient Name: Date of Service: [name], [name] 04/09/2023 11:15 A M Medical Record Number: [id number] Patient Account Number: [id number] Date of Birth/Sex: Treating RN: 06/03/1956 (67 y.o. [name] Primary Care Hester Joslin: Jerel [place], [place] [facility] N Other Clinician: Referring Brayla Pat: Treating Chai Verdejo/Extender: Jacklynn [place], [place] RGA N Weeks in Treatment: 64 Wound Status Wound Number: 23 Primary Etiology: Pressure Ulcer Wound Location: Left, Posterior Ankle Wound Status: Open Wounding Event: Pressure Injury Comorbid History: Cataracts, Hypotension, Neuropathy, Quadriplegia Date Acquired: 11/03/2021 Weeks Of Treatment: 64 Clustered Wound: No Photos Wound Measurements Length: (cm) 1 Width: (cm) 0.5 Depth: (cm) 0.6 Area: (cm) 0.393 Volume: (cm) 0.236 % Reduction in Area: 90.7% % Reduction in Volume: 86.1% Epithelialization: Small (1-33%) Tunneling: No Undermining: No Wound Description Classification: Category/Stage IV Wound Margin: Flat and Intact Exudate Amount: Medium Exudate Type: Serosanguineous Exudate Color: red, brown Foul Odor After Cleansing: No Slough/Fibrino Yes Wound Bed Granulation Amount: Large (67-100%) Exposed Structure Granulation Quality: Red Fascia Exposed: No Necrotic Amount: Small (1-33%) Fat Layer (Subcutaneous Tissue) Exposed: Yes Necrotic Quality: Adherent Slough Tendon Exposed: No Muscle Exposed: No Joint Exposed: No Bone Exposed: No Periwound Skin Texture Texture Color No Abnormalities Noted: Yes No Abnormalities NotedWILLADEAN, BERBER ([id number]) [id number] Page 7 of 7 No Abnormalities Noted: Yes No Abnormalities Noted: Yes Moisture Temperature / Pain No Abnormalities Noted: Yes Temperature: No Abnormality Electronic Signature(s) Signed: 04/09/2023 5:06:07 PM By: Zenaida Deed RN, BSN Entered By: Zenaida Deed on 04/09/2023 16:18:35 -------------------------------------------------------------------------------- Vitals Details Patient Name: Date of Service: [name], [place]  [facility] Nip Y. 04/09/2023 11:15 A M Medical Record Number: [id number] Patient Account Number: [id number] Date of Birth/Sex: Treating RN: 17-Jun-1956 (67 y.o. [name] Primary Care Lecil Tapp: [place]rel [place] [facility] N Other Clinician: Referring Mercer Stallworth: Treating Aramis Weil/Extender: Jacklynn [place], [place] RGA N Weeks in Treatment: 85 Vital Signs Time Taken: 11:54 Temperature (F): 97.8 Height (in): 64 Pulse (bpm): 62 Weight (lbs): 175 Respiratory Rate (breaths/min): 18 Body Mass Index (BMI): 30 Blood Pressure (mmHg): 102/35 Reference Range: 80 - 120 mg / dl Electronic Signature(s) Signed: 04/09/2023 5:06:07 PM By: Zenaida Deed RN, BSN Entered By: Zenaida Deed on 04/09/2023 11:55:47

## 2023-04-24 ENCOUNTER — Telehealth: Payer: Self-pay

## 2023-04-24 LAB — POCT INR: INR: 2.3 — AB (ref 0.80–1.20)

## 2023-04-24 NOTE — Telephone Encounter (Signed)
INR values have been abstracted.  Thank you!

## 2023-04-24 NOTE — Telephone Encounter (Signed)
Pt is calling to report her INR 2.3 for today and 3.2 INR 04/09/23

## 2023-04-27 ENCOUNTER — Other Ambulatory Visit (HOSPITAL_COMMUNITY): Payer: Self-pay

## 2023-04-27 ENCOUNTER — Other Ambulatory Visit: Payer: Self-pay

## 2023-04-27 ENCOUNTER — Other Ambulatory Visit: Payer: Self-pay | Admitting: Family Medicine

## 2023-04-27 DIAGNOSIS — E038 Other specified hypothyroidism: Secondary | ICD-10-CM

## 2023-05-09 ENCOUNTER — Other Ambulatory Visit (HOSPITAL_COMMUNITY): Payer: Self-pay

## 2023-05-09 ENCOUNTER — Telehealth: Payer: Self-pay | Admitting: *Deleted

## 2023-05-09 ENCOUNTER — Encounter: Payer: Self-pay | Admitting: Family Medicine

## 2023-05-09 LAB — POCT INR: INR: 2.6 — AB (ref 0.80–1.20)

## 2023-05-09 NOTE — Telephone Encounter (Signed)
Pt called to report that her INR was taken on Monday and it was a 2.6.  She also stated that she was having problems with her insurance adding PCP.  Informed her that I would reach out to the team to see if we could figure out the situation with that.

## 2023-05-10 ENCOUNTER — Ambulatory Visit (HOSPITAL_BASED_OUTPATIENT_CLINIC_OR_DEPARTMENT_OTHER): Payer: HMO | Admitting: General Surgery

## 2023-05-10 DIAGNOSIS — R339 Retention of urine, unspecified: Secondary | ICD-10-CM | POA: Diagnosis not present

## 2023-05-23 ENCOUNTER — Telehealth: Payer: Self-pay | Admitting: *Deleted

## 2023-05-23 NOTE — Telephone Encounter (Signed)
Pt calling to report that her INR was 2.9 on Tuesday.  She also scheduled an appointment in October for follow up and her AWV in December.  I called pt and informed her that health insurance does NOT cover shingrix in the office only at the pharmacy.

## 2023-05-24 LAB — POCT INR: INR: 2.9 — AB (ref 0.80–1.20)

## 2023-05-24 NOTE — Telephone Encounter (Signed)
Fantastic, INR of 2.9 has been abstracted.  Thank you for also going over Shingrix requirements.

## 2023-05-28 DIAGNOSIS — L89623 Pressure ulcer of left heel, stage 3: Secondary | ICD-10-CM | POA: Diagnosis not present

## 2023-05-29 ENCOUNTER — Other Ambulatory Visit (HOSPITAL_COMMUNITY): Payer: Self-pay

## 2023-05-30 ENCOUNTER — Other Ambulatory Visit: Payer: Self-pay

## 2023-06-13 DIAGNOSIS — R339 Retention of urine, unspecified: Secondary | ICD-10-CM | POA: Diagnosis not present

## 2023-06-15 DIAGNOSIS — R339 Retention of urine, unspecified: Secondary | ICD-10-CM | POA: Diagnosis not present

## 2023-06-18 ENCOUNTER — Other Ambulatory Visit (HOSPITAL_COMMUNITY): Payer: Self-pay

## 2023-06-19 ENCOUNTER — Other Ambulatory Visit: Payer: Self-pay

## 2023-06-22 ENCOUNTER — Encounter (HOSPITAL_BASED_OUTPATIENT_CLINIC_OR_DEPARTMENT_OTHER): Payer: HMO | Admitting: General Surgery

## 2023-07-02 ENCOUNTER — Encounter (HOSPITAL_BASED_OUTPATIENT_CLINIC_OR_DEPARTMENT_OTHER): Payer: HMO | Admitting: General Surgery

## 2023-07-02 ENCOUNTER — Encounter (HOSPITAL_BASED_OUTPATIENT_CLINIC_OR_DEPARTMENT_OTHER): Payer: HMO | Attending: General Surgery | Admitting: General Surgery

## 2023-07-02 DIAGNOSIS — N3020 Other chronic cystitis without hematuria: Secondary | ICD-10-CM | POA: Diagnosis not present

## 2023-07-02 DIAGNOSIS — G894 Chronic pain syndrome: Secondary | ICD-10-CM | POA: Diagnosis not present

## 2023-07-02 DIAGNOSIS — N200 Calculus of kidney: Secondary | ICD-10-CM | POA: Diagnosis not present

## 2023-07-02 DIAGNOSIS — L89623 Pressure ulcer of left heel, stage 3: Secondary | ICD-10-CM | POA: Diagnosis not present

## 2023-07-02 DIAGNOSIS — N261 Atrophy of kidney (terminal): Secondary | ICD-10-CM | POA: Diagnosis not present

## 2023-07-02 DIAGNOSIS — L89893 Pressure ulcer of other site, stage 3: Secondary | ICD-10-CM | POA: Diagnosis not present

## 2023-07-02 DIAGNOSIS — R339 Retention of urine, unspecified: Secondary | ICD-10-CM | POA: Diagnosis not present

## 2023-07-02 DIAGNOSIS — L97828 Non-pressure chronic ulcer of other part of left lower leg with other specified severity: Secondary | ICD-10-CM | POA: Diagnosis not present

## 2023-07-02 DIAGNOSIS — N319 Neuromuscular dysfunction of bladder, unspecified: Secondary | ICD-10-CM | POA: Diagnosis not present

## 2023-07-03 DIAGNOSIS — L89623 Pressure ulcer of left heel, stage 3: Secondary | ICD-10-CM | POA: Diagnosis not present

## 2023-07-03 NOTE — Progress Notes (Signed)
[place], [place] Y ([id number]) [id number] Page 1 of 10 Visit Report for 07/02/2023 Allergy List Details Patient Name: Date of Service: [name], [name] 07/02/2023 10:30 A M Medical Record Number: [id number] Patient Account Number: [id number] Date of Birth/Sex: Treating RN: 03-03-1956 (67 y.o. [name] Storey Stangeland: Saralyn Pilar Other Clinician: Referring Jumar Greenstreet: Treating Nicandro Perrault/Extender: Harrie Jeans Weeks in Treatment: 0 Allergies Active Allergies sulfamethoxazole Severity: Moderate Active: 06/20/2019 latex Active: 12/07/2011 Allergy Notes Electronic Signature(s) Signed: 07/02/2023 5:48:36 PM By: Zenaida Deed RN, BSN Entered By: Zenaida Deed on 07/02/2023 11:00:42 -------------------------------------------------------------------------------- Arrival Information Details Patient Name: Date of Service: Angela Husky, [name] 07/02/2023 10:30 A M Medical Record Number: [id number] Patient Account Number: [id number] Date of Birth/Sex: Treating RN: 02/06/1956 (67 y.o. Billy Coast, Linda Primary Walker Mithra Spano: Saralyn Pilar Other Clinician: Referring Aleynah Rocchio: Treating Izabelle Daus/Extender: Park Liter in Treatment: 0 Visit Information Patient Arrived: Wheel Chair Arrival Time: 10:54 Accompanied By: sister Transfer Assistance: None History Since Last Visit Added or deleted any medications: No Any new allergies or adverse reactions: No Had a fall or experienced change in activities of daily living that may affect risk of falls: No Signs or symptoms of abuse/neglect since last visito No Hospitalized since last visit: No Implantable device outside of the clinic excluding cellular tissue based products placed in the center since last visit: No Has Dressing in Place as Prescribed: Yes Has Compression in Place as Prescribed: Yes Has Footwear/Offloading in Place as Prescribed: Yes Left:  Multipodus Split/Boot Right: Multipodus Split/Boot Pain Present Now: No Notes stays in wheelchair [place] ([id number]) [id number].pdf Page 2 of 10 Electronic Signature(s) Signed: 07/02/2023 5:48:36 PM By: Zenaida Deed RN, BSN Entered By: Zenaida Deed on 07/02/2023 10:59:30 -------------------------------------------------------------------------------- Clinic Level of Walker Assessment Details Patient Name: Date of Service: [place], [place] [facility] Y. 07/02/2023 10:30 A M Medical Record Number: [id number] Patient Account Number: [id number] Date of Birth/Sex: Treating RN: 09-28-1956 (67 y.o. [name] Devonte Migues: Saralyn Pilar Other Clinician: Referring Zharia Conrow: Treating Gianni Fuchs/Extender: Harrie Jeans Weeks in Treatment: 0 Clinic Level of Walker Assessment Items TOOL 1 Quantity Score []  - 0 Use when EandM and Procedure is performed on INITIAL visit ASSESSMENTS - Nursing Assessment / Reassessment X- 1 20 General Physical Exam (combine w/ comprehensive assessment (listed just below) when performed on new pt. evals) X- 1 25 Comprehensive Assessment (HX, ROS, Risk Assessments, Wounds Hx, etc.) ASSESSMENTS - Wound and Skin Assessment / Reassessment []  - 0 Dermatologic / Skin Assessment (not related to wound area) ASSESSMENTS - Ostomy and/or Continence Assessment and Walker []  - 0 Incontinence Assessment and Management []  - 0 Ostomy Walker Assessment and Management (repouching, etc.) PROCESS - Coordination of Walker X - Simple Patient / Family Education for ongoing Walker 1 15 []  - 0 Complex (extensive) Patient / Family Education for ongoing Walker X- 1 10 Staff obtains Chiropractor, Records, T Results / Process Orders est []  - 0 Staff telephones HHA, Nursing Homes / Clarify orders / etc []  - 0 Routine Transfer to another Facility (non-emergent condition) []  - 0 Routine Hospital Admission (non-emergent condition) X- 1  15 New Admissions / Manufacturing engineer / Ordering NPWT Apligraf, etc. , []  - 0 Emergency Hospital Admission (emergent condition) PROCESS - Special Needs []  - 0 Pediatric / Minor Patient Management []  - 0 Isolation Patient Management []  - 0 Hearing / Language / Visual special needs []  - 0 Assessment of Community assistance (transportation, D/C planning, etc.) []  -  0 Additional assistance / Altered mentation []  - 0 Support Surface(s) Assessment (bed, cushion, seat, etc.) INTERVENTIONS - Miscellaneous []  - 0 External ear exam []  - 0 Patient Transfer (multiple staff / Nurse, adult / Similar devices) []  - 0 Simple Staple / Suture removal (25 or less) []  - 0 Complex Staple / Suture removal (26 or more) []  - 0 Hypo/Hyperglycemic Management (do not check if billed separately) KAYIN, DOERFLER ([id number]) [id number] Page 3 of 10 []  - 0 Ankle / Brachial Index (ABI) - do not check if billed separately Has the patient been seen at the hospital within the last three years: Yes Total Score: 85 Level Of Walker: New/Established - Level 3 Electronic Signature(s) Signed: 07/02/2023 5:48:36 PM By: Zenaida Deed RN, BSN Entered By: Zenaida Deed on 07/02/2023 11:54:20 -------------------------------------------------------------------------------- Encounter Discharge Information Details Patient Name: Date of Service: Angela Husky, [name] 07/02/2023 10:30 A M Medical Record Number: [id number] Patient Account Number: [id number] Date of Birth/Sex: Treating RN: [birth year] (67 y.o. [name] [name]: Saralyn Pilar Other Clinician: Referring Nicholson Starace: Treating Justn Quale/Extender: Harrie Jeans Weeks in Treatment: 0 Encounter Discharge Information Items Post Procedure Vitals Discharge Condition: Stable Temperature (F): 97.9 Ambulatory Status: Wheelchair Pulse (bpm): 80 Discharge Destination: Home Respiratory Rate  (breaths/min): 18 Transportation: Private Auto Blood Pressure (mmHg): 95/62 Accompanied By: sister Schedule Follow-up Appointment: Yes Clinical Summary of Walker: Patient Declined Electronic Signature(s) Signed: 07/02/2023 5:48:36 PM By: Zenaida Deed RN, BSN Entered By: Zenaida Deed on 07/02/2023 11:55:53 -------------------------------------------------------------------------------- Lower Extremity Assessment Details Patient Name: Date of Service: [name], [place] [facility] Y. 07/02/2023 10:30 A M Medical Record Number: [id number] Patient Account Number: [id number] Date of Birth/Sex: Treating RN: 1956-03-15 (67 y.o. [name] Haadiya Frogge: Saralyn Pilar Other Clinician: Referring Seaver Machia: Treating Dalayla Aldredge/Extender: Harrie Jeans Weeks in Treatment: 0 Edema Assessment Assessed: [Left: No] [Right: No] Edema: [Left: Ye] [Right: s] Calf Left: Right: Point of Measurement: From Medial Instep 23 cm Ankle Left: Right: Point of Measurement: From Medial Instep 20 cm Vascular Assessment Pulses: [address] [place] ([id number]) [Right:[id number] Page 4 of 10] Palpable: [Left:Yes] Extremity colors, hair growth, and conditions: Extremity Color: [Left:Normal] Hair Growth on Extremity: [Left:Yes] Temperature of Extremity: [Left:Warm] Capillary Refill: [Left:< 3 seconds] Dependent Rubor: [Left:No] Blanched when Elevated: [Left:No No] Electronic Signature(s) Signed: 07/02/2023 5:48:36 PM By: Zenaida Deed RN, BSN Entered By: Zenaida Deed on 07/02/2023 11:17:37 -------------------------------------------------------------------------------- Multi Wound Chart Details Patient Name: Date of Service: Angela Husky, [name] 07/02/2023 10:30 A M Medical Record Number: [id number] Patient Account Number: [id number] Date of Birth/Sex: Treating RN: Jul 06, 1956 (67 y.o. [name] Hildagarde Holleran: Saralyn Pilar  Other Clinician: Referring Zenas Santa: Treating Hadassah Rana/Extender: Harrie Jeans Weeks in Treatment: 0 Vital Signs Height(in): Pulse(bpm): 80 Weight(lbs): Blood Pressure(mmHg): 95/62 Body Mass Index(BMI): Temperature(F): 97.9 Respiratory Rate(breaths/min): 18 [25:Photos:] [N/A:N/A] Left, Lateral Lower Leg Left Calcaneus N/A Wound Location: Pressure Injury Pressure Injury N/A Wounding Event: Pressure Ulcer Pressure Ulcer N/A Primary Etiology: Venous Leg Ulcer N/A N/A Secondary Etiology: Cataracts, Deep Vein Thrombosis, Cataracts, Deep Vein Thrombosis, N/A Comorbid History: Hypotension, Neuropathy, QuadriplegiaHypotension, Neuropathy, Quadriplegia 06/03/2021 06/03/2021 N/A Date Acquired: 0 0 N/A Weeks of Treatment: Open Open N/A Wound Status: No No N/A Wound Recurrence: 1x0.3x0.2 0.2x0.2x0.1 N/A Measurements L x W x D (cm) 0.236 0.031 N/A A (cm) : rea 0.047 0.003 N/A Volume (cm) : Category/Stage III Category/Stage III N/A Classification: Medium Small N/A Exudate A mount: Serosanguineous Serous N/A Exudate Type: red,  brown amber N/A Exudate Color: Distinct, outline attached Flat and Intact N/A Wound Margin: Large (67-100%) Large (67-100%) N/A Granulation A mount: Red Red N/A Granulation Quality: Small (1-33%) None Present (0%) N/A Necrotic A mount: Fat Layer (Subcutaneous Tissue): Yes Fat Layer (Subcutaneous Tissue): Yes N/A Exposed Structures: Fascia: No Fascia: No Tendon: No Tendon: No Muscle: No Muscle: No Joint: No Joint: No Bone: No Bone: No Medium (34-66%) Large (67-100%) N/A EpithelializationKALINA, KAMMER ([id number]) [id number] Page 5 of 10 Debridement - Selective/Open Wound N/A N/A Debridement: 11:35 N/A N/A Pre-procedure Verification/Time Out Taken: [facility] N/A N/A Tissue Debrided: Non-Viable Tissue N/A N/A Level: 0.24 N/A N/A Debridement A (sq cm): rea Curette N/A  N/A Instrument: Minimum N/A N/A Bleeding: Pressure N/A N/A Hemostasis A chieved: Insensate N/A N/A Procedural Pain: Insensate N/A N/A Post Procedural Pain: Procedure was tolerated well N/A N/A Debridement Treatment Response: 1x0.3x0.1 N/A N/A Post Debridement Measurements L x W x D (cm) 0.024 N/A N/A Post Debridement Volume: (cm) Category/Stage III N/A N/A Post Debridement Stage: No Abnormalities Noted Scarring: Yes N/A Periwound Skin Texture: No Abnormalities Noted No Abnormalities Noted N/A Periwound Skin Moisture: No Abnormalities Noted No Abnormalities Noted N/A Periwound Skin Color: No Abnormality No Abnormality N/A Temperature: Debridement N/A N/A Procedures Performed: Treatment Notes Electronic Signature(s) Signed: 07/02/2023 11:44:28 AM By: Duanne Guess MD FACS Signed: 07/02/2023 5:48:36 PM By: Zenaida Deed RN, BSN Entered By: Duanne Guess on 07/02/2023 11:44:28 -------------------------------------------------------------------------------- Multi-Disciplinary Walker Plan Details Patient Name: Date of Service: Angela Husky, [name] 07/02/2023 10:30 A M Medical Record Number: [id number] Patient Account Number: [id number] Date of Birth/Sex: Treating RN: 1956/07/20 (67 y.o. [name] Myrle Dues: Saralyn Pilar Other Clinician: Referring Kayli Beal: Treating Eligha Kmetz/Extender: Park Liter in Treatment: 0 Multidisciplinary Walker Plan reviewed with physician Active Inactive Pressure Nursing Diagnoses: Knowledge deficit related to management of pressures ulcers Potential for impaired tissue integrity related to pressure, friction, moisture, and shear Goals: Patient/caregiver will verbalize understanding of pressure ulcer management Date Initiated: 07/02/2023 Target Resolution Date: 07/30/2023 Goal Status: Active Interventions: Assess: immobility, friction, shearing, incontinence upon admission and as  needed Assess offloading mechanisms upon admission and as needed Assess potential for pressure ulcer upon admission and as needed Provide education on pressure ulcers Notes: Wound/Skin Impairment Nursing Diagnoses: Impaired tissue integrity Knowledge deficit related to ulceration/compromised skin integrity PUNAM, MANGUM ([id number]) [id number] Page 6 of 10 Goals: Patient/caregiver will verbalize understanding of skin Walker regimen Date Initiated: 07/02/2023 Target Resolution Date: 07/30/2023 Goal Status: Active Ulcer/skin breakdown will have a volume reduction of 30% by week 4 Date Initiated: 07/02/2023 Target Resolution Date: 07/30/2023 Goal Status: Active Interventions: Assess patient/caregiver ability to obtain necessary supplies Assess patient/caregiver ability to perform ulcer/skin Walker regimen upon admission and as needed Assess ulceration(s) every visit Provide education on ulcer and skin Walker Treatment Activities: Skin Walker regimen initiated : 07/02/2023 Topical wound management initiated : 07/02/2023 Notes: Electronic Signature(s) Signed: 07/02/2023 5:48:36 PM By: Zenaida Deed RN, BSN Entered By: Zenaida Deed on 07/02/2023 12:19:53 -------------------------------------------------------------------------------- Pain Assessment Details Patient Name: Date of Service: Angela Husky, [name] 07/02/2023 10:30 A M Medical Record Number: [id number] Patient Account Number: [id number] Date of Birth/Sex: Treating RN: 01/09/1956 (68 y.o. [name] Sloane Palmer: Saralyn Pilar Other Clinician: Referring Baldemar Dady: Treating Makenzie Vittorio/Extender: Harrie Jeans Weeks in Treatment: 0 Active Problems Location of Pain Severity and Description of Pain Patient Has Paino No Site Locations Rate the pain. Current Pain Level: 0 Pain Management and  Medication Current Pain Management: Electronic Signature(s) Signed: 07/02/2023  5:48:36 PM By: Zenaida Deed RN, BSN Entered By: Zenaida Deed on 07/02/2023 11:35:02 [place], [place] Y ([id number]) [id number] Page 7 of 10 -------------------------------------------------------------------------------- Patient/Caregiver Education Details Patient Name: Date of Service: [place], [place] [facility] J. 7/29/2024andnbsp10:30 A M Medical Record Number: [id number] Patient Account Number: [id number] Date of Birth/Gender: Treating RN: 02/04/1956 (67 y.o. [name] Physician: Saralyn Pilar Other Clinician: Referring Physician: Treating Physician/Extender: Park Liter in Treatment: 0 Education Assessment Education Provided To: Patient Education Topics Provided Pressure: Methods: Explain/Verbal Responses: Reinforcements needed, State content correctly Wound/Skin Impairment: Methods: Explain/Verbal Responses: Reinforcements needed, State content correctly Electronic Signature(s) Signed: 07/02/2023 5:48:36 PM By: Zenaida Deed RN, BSN Entered By: Zenaida Deed on 07/02/2023 12:20:14 -------------------------------------------------------------------------------- Wound Assessment Details Patient Name: Date of Service: Angela Husky, [name] 07/02/2023 10:30 A M Medical Record Number: [id number] Patient Account Number: [id number] Date of Birth/Sex: Treating RN: 18-Sep-1956 (67 y.o. [name] Javeria Briski: Saralyn Pilar Other Clinician: Referring Andron Marrazzo: Treating Halynn Reitano/Extender: Harrie Jeans Weeks in Treatment: 0 Wound Status Wound Number: 25 Primary Pressure Ulcer Etiology: Wound Location: Left, Lateral Lower Leg Secondary Venous Leg Ulcer Wounding Event: Pressure Injury Etiology: Date Acquired: 06/03/2021 Wound Status: Open Weeks Of Treatment: 0 Comorbid Cataracts, Deep Vein Thrombosis, Hypotension, Neuropathy, Clustered Wound: No History:  [address] [place], [place] Y ([id number]) [id number] Page 8 of 10 Wound Measurements Length: (cm) 1 Width: (cm) 0.3 Depth: (cm) 0.2 Area: (cm) 0.236 Volume: (cm) 0.047 % Reduction in Area: % Reduction in Volume: Epithelialization: Medium (34-66%) Tunneling: No Undermining: No Wound Description Classification: Category/Stage III Wound Margin: Distinct, outline attached Exudate Amount: Medium Exudate Type: Serosanguineous Exudate Color: red, brown Foul Odor After Cleansing: No Slough/Fibrino Yes Wound Bed Granulation Amount: Large (67-100%) Exposed Structure Granulation Quality: Red Fascia Exposed: No Necrotic Amount: Small (1-33%) Fat Layer (Subcutaneous Tissue) Exposed: Yes Necrotic Quality: Adherent Slough Tendon Exposed: No Muscle Exposed: No Joint Exposed: No Bone Exposed: No Periwound Skin Texture Texture Color No Abnormalities Noted: Yes No Abnormalities Noted: Yes Moisture Temperature / Pain No Abnormalities Noted: Yes Temperature: No Abnormality Treatment Notes Wound #25 (Lower Leg) Wound Laterality: Left, Lateral Cleanser Peri-Wound Walker Topical Primary Dressing Maxorb Extra Ag+ Alginate Dressing, 2x2 (in/in) Discharge Instruction: Apply to wound bed as instructed Secondary Dressing ALLEVYN Gentle Border, 5x5 (in/in) Discharge Instruction: Apply over primary dressing as directed. Secured With Compression Wrap Compression Stockings Facilities manager) Signed: 07/02/2023 5:48:36 PM By: Zenaida Deed RN, BSN Entered By: Zenaida Deed on 07/02/2023 11:28:02 -------------------------------------------------------------------------------- Wound Assessment Details Patient Name: Date of Service: Angela Husky, [name] 07/02/2023 10:30 A M Medical Record Number: [id number] Patient Account Number: [id number] Date of Birth/Sex: Treating RN: 1956-07-10 (67 y.o. [name] Lynk Marti: Saralyn Pilar Other Clinician: Referring Alvar Malinoski: Treating Tyris Eliot/Extender: Park Liter in Treatment: [address] [place] ([id number]) [id number] Page 9 of 10 Wound Status Wound Number: 26 Primary Pressure Ulcer Etiology: Wound Location: Left Calcaneus Wound Status: Open Wounding Event: Pressure Injury Comorbid Cataracts, Deep Vein Thrombosis, Hypotension, Neuropathy, Date Acquired: 06/03/2021 History: Quadriplegia Weeks Of Treatment: 0 Clustered Wound: No Photos Wound Measurements Length: (cm) 0.2 Width: (cm) 0.2 Depth: (cm) 0.1 Area: (cm) 0.031 Volume: (cm) 0.003 % Reduction in Area: % Reduction in Volume: Epithelialization: Large (67-100%) Tunneling: No Undermining: No Wound Description Classification: Category/Stage III Wound Margin: Flat and Intact Exudate Amount: Small Exudate Type: Serous Exudate Color: amber Foul Odor After  Cleansing: No Slough/Fibrino No Wound Bed Granulation Amount: Large (67-100%) Exposed Structure Granulation Quality: Red Fascia Exposed: No Necrotic Amount: None Present (0%) Fat Layer (Subcutaneous Tissue) Exposed: Yes Tendon Exposed: No Muscle Exposed: No Joint Exposed: No Bone Exposed: No Periwound Skin Texture Texture Color No Abnormalities Noted: No No Abnormalities Noted: Yes Scarring: Yes Temperature / Pain Temperature: No Abnormality Moisture No Abnormalities Noted: Yes Treatment Notes Wound #26 (Calcaneus) Wound Laterality: Left Cleanser Peri-Wound Walker Topical Primary Dressing Maxorb Extra Ag+ Alginate Dressing, 2x2 (in/in) Discharge Instruction: Apply to wound bed as instructed Secondary Dressing ALLEVYN Gentle Border, 5x5 (in/in) Discharge Instruction: Apply over primary dressing as directed. Secured With [employer], [place] Y ([id number]) [id number] Page 10 of 10 Compression Stockings Add-Ons Electronic  Signature(s) Signed: 07/02/2023 5:48:36 PM By: Zenaida Deed RN, BSN Entered By: Zenaida Deed on 07/02/2023 11:28:53 -------------------------------------------------------------------------------- Vitals Details Patient Name: Date of Service: Angela Husky, [name] 07/02/2023 10:30 A M Medical Record Number: [id number] Patient Account Number: [id number] Date of Birth/Sex: Treating RN: 07-20-1956 (67 y.o. [name] Dasha Kawabata: Saralyn Pilar Other Clinician: Referring Sheree Lalla: Treating Kavonte Bearse/Extender: Harrie Jeans Weeks in Treatment: 0 Vital Signs Time Taken: 10:59 Temperature (F): 97.9 Pulse (bpm): 80 Respiratory Rate (breaths/min): 18 Blood Pressure (mmHg): 95/62 Reference Range: 80 - 120 mg / dl Electronic Signature(s) Signed: 07/02/2023 5:48:36 PM By: Zenaida Deed RN, BSN Entered By: Zenaida Deed on 07/02/2023 11:00:21

## 2023-07-03 NOTE — Progress Notes (Signed)
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 antibiotic prior to applying it; the other nurse has been using an entire square of Hydrofera Blue and using a very minimal amount of the antibiotic. There appears to be some disconnect in the instructions. The wounds are may be a little bit smaller. There remains necrotic tissue in the calcaneal wound, along with adherent slough. The Achilles wound is better with just some slough present. 02/20/2022: I took another culture at her last visit. This showed only some yeast, no bacteria. She has still been using the [facility] antibiotic and Hydrofera Blue. She is concerned about the cost of her biweekly visits and the debridements that occur. She is wondering if we can go back to [facility] and stretch her visits out to once a month. 03/20/2022: At her last visit, per her request, we changed her dressing back to [facility] under Hydrofera Blue. She reports that her home health nurses continue to not cut the [facility] Blue to fit the wound and instead  just place it over the wound. The Santyl is being applied to the [facility] and so the patient does not think it is actually contacting her wounds. 04/17/2022: Today, both wounds are bigger and there was a strong odor on her dressings. There is also some greenish discharge present. We have been using Santyl under Hydrofera Blue. The patient has numerous complaints about her home health care providers today, but this is fairly typical. 05/15/2022: Last visit, I was concerned for infection and took a culture. Based upon these results, we ordered a new Keystone topical antibiotic. We have been using that with silver alginate on her wounds. Both wounds are smaller today. The intake nurse was concerned about some black-looking discoloration on her heel. On further inspection, it actually ended up being old hematoma. 06/12/2022: Both wounds measure a little bit smaller today. They are both fairly friable and bleed easily with manipulation. The heel wound has some additional discolored and boggy tissue on the more distal aspect along with some accumulated slough. The posterior ankle wound now has Achilles tendon exposed, which I do not think has been documented in the past. We are using Keystone topical antibiotic and silver alginate. 07/11/2022: Both wounds are little smaller again today. The boggy tissue on the heel was debrided at our last visit. She still has some slough buildup on both wound  surfaces. The Achilles tendon remains exposed at the ankle wound. Her Jodie Echevaria compound was unfortunately compromised with moisture and is not usable any longer. She has had additional disagreements with her home health care providers and may not be able to receive their services going forward. 08/14/2022: No change in the wound sizes today but the heel wound is quite superficial. Both are very clean. Tendon is still exposed at the ankle. We have been using silver alginate. She is concerned about some redness on her  lower leg adjacent to the ankle wound. 09/11/2022: The heel wound is a little larger today. Both wounds have slough accumulation. Tendon remains exposed at the ankle. 10/09/2022: The heel wound looks better today and has filled to the point that it is flush with the surrounding tissue. There is still tendon exposure at the ankle. No real slough accumulation, but both wounds are drier than ideal. 11/06/2022: The heel wound continues to improve. It is still fairly superficial but has some nonviable subcutaneous tissue present, along with slough. The ankle wound is smaller, but the exposed tendon is necrotic. The moisture balance has improved markedly in both locations. She was approved for TheraSkin, but the co-pay was $350 and cost prohibitive for her. 12/11/2022: Both the ankle and heel wounds are a bit smaller today. There is still nonviable exposed tendon at the ankle. She has a new wound on the anterior tibial surface of the left leg. It looks as though it may have been a blister. It is clean and limited to breakdown of skin. 01/08/2023: The anterior tibial surface wound has healed. Both ankle and heel wounds have deteriorated over the last month. The heel shows signs of pressure induced tissue damage. There is slough and nonviable subcutaneous tissue at both sites. The tendon at the ankle looks worse. 3/18; patient presents for follow-up. She has been using Hydrofera Blue to the wound sites. Overall there is been improvement since last clinic visit. Patient has no issues or complaints today. [place], [place] Y ([id number]) [id number] Page 7 of 11 03/19/2023: The tendon is now covered on the lateral leg wound and the wound is considerably smaller. The heel also looks better. There is just 1 small area at the most proximal posterior aspect of the wound that shows evidence of ongoing pressure-induced tissue injury. 04/09/2023: The heel has a fair amount of slough accumulation and  despite all efforts to identify and alleviate sources of potential pressure, there is still pressure induced tissue injury occurring. The lateral ankle wound is quite a bit smaller, but still has some depth to it. 07/02/2023: She returns today after a nearly 7-month absence. The wounds look remarkably better. The heel is nearly closed with just a tiny superficial slit remaining. The ankle wound has also filled in considerably and has just some light slough on the surface. Patient History Information obtained from Patient, Chart. Allergies sulfamethoxazole (Severity: Moderate), latex Family History Unknown History. Social History Never smoker, Marital Status - Divorced, Alcohol Use - Never, Drug Use - No History, Caffeine Use - Rarely. Medical History Eyes Patient has history of Cataracts - Cataracts in both eyes Cardiovascular Patient has history of Deep Vein Thrombosis, Hypotension Endocrine Denies history of Type I Diabetes, Type II Diabetes Genitourinary Denies history of End Stage Renal Disease Integumentary (Skin) Denies history of History of Burn Neurologic Patient has history of Neuropathy - arm, Quadriplegia Denies history of Dementia, Paraplegia, Seizure Disorder Oncologic Denies history of Received Chemotherapy, Received Radiation Psychiatric Denies history of Anorexia/bulimia, Confinement  Anxiety Medical A Surgical History Notes nd Constitutional Symptoms (General Health) obesity Eyes 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 Suprapubic catheter Musculoskeletal Osteoporosis, hx decubitis ulcer of sacral region (stage 1); Review of Systems (ROS) Constitutional Symptoms (General Health) Denies complaints or symptoms of Fatigue, Fever, Chills, Marked Weight Change. Eyes Complains or has symptoms of Glasses / Contacts. Denies  complaints or symptoms of Dry Eyes, Vision Changes. Ear/Nose/Mouth/Throat Denies complaints or symptoms of Chronic sinus problems or rhinitis. Respiratory Denies complaints or symptoms of Chronic or frequent coughs, Shortness of Breath. Cardiovascular Denies complaints or symptoms of Chest pain. Gastrointestinal Denies complaints or symptoms of Frequent diarrhea, Nausea, Vomiting. Endocrine Denies complaints or symptoms of Heat/cold intolerance. Genitourinary Denies complaints or symptoms of Frequent urination. Integumentary (Skin) Complains or has symptoms of Wounds. Musculoskeletal Complains or has symptoms of Muscle Weakness. Neurologic Complains or has symptoms of Numbness/parasthesias. Psychiatric Denies complaints or symptoms of Claustrophobia. [place], [place] Y ([id number]) [id number] Page 8 of 11 Objective Constitutional Hypotensive, asymptomatic; normal for this patient. No acute distress. Vitals Time Taken: 10:59 AM, Temperature: 97.9 F, Pulse: 80 bpm, Respiratory Rate: 18 breaths/min, Blood Pressure: 95/62 mmHg. Respiratory Normal work of breathing on room air. General Notes: 07/02/2023: The wounds look remarkably better. The heel is nearly closed with just a tiny superficial slit remaining. The ankle wound has also filled in considerably and has just some light slough on the surface. Integumentary (Hair, Skin) Wound #25 status is Open. Original cause of wound was Pressure Injury. The date acquired was: 06/03/2021. The wound is located on the Left,Lateral Lower Leg. The wound measures 1cm length x 0.3cm width x 0.2cm depth; 0.236cm^2 area and 0.047cm^3 volume. There is Fat Layer (Subcutaneous Tissue) exposed. There is no tunneling or undermining noted. There is a medium amount of serosanguineous drainage noted. The wound margin is distinct with the outline attached to the wound base. There is large (67-100%) red granulation within the wound bed.  There is a small (1-33%) amount of necrotic tissue within the wound bed including Adherent Slough. The periwound skin appearance had no abnormalities noted for texture. The periwound skin appearance had no abnormalities noted for moisture. The periwound skin appearance had no abnormalities noted for color. Periwound temperature was noted as No Abnormality. Wound #26 status is Open. Original cause of wound was Pressure Injury. The date acquired was: 06/03/2021. The wound is located on the Left Calcaneus. The wound measures 0.2cm length x 0.2cm width x 0.1cm depth; 0.031cm^2 area and 0.003cm^3 volume. There is Fat Layer (Subcutaneous Tissue) exposed. There is no tunneling or undermining noted. There is a small amount of serous drainage noted. The wound margin is flat and intact. There is large (67-100%) red granulation within the wound bed. There is no necrotic tissue within the wound bed. The periwound skin appearance had no abnormalities noted for moisture. The periwound skin appearance had no abnormalities noted for color. The periwound skin appearance exhibited: Scarring. Periwound temperature was noted as No Abnormality. Assessment Active Problems ICD-10 Pressure ulcer of left heel, stage 3 Non-pressure chronic ulcer of other part of left lower leg with other specified severity Unspecified injury at unspecified level of cervical spinal cord, sequela Procedures Wound #25 Pre-procedure diagnosis of Wound #25 is a Pressure Ulcer located on the Left,Lateral Lower Leg .Severity of Tissue Pre Debridement is: Fat layer exposed. There was a Selective/Open Wound Non-Viable Tissue Debridement with a total area  of 0.24 sq cm performed by Duanne Guess, MD. With the following instrument(s): Curette to remove Non-Viable tissue/material. Material removed includes [facility]. No specimens were taken. A time out was conducted at 11:35, prior to the start of the procedure. A Minimum amount of bleeding was  controlled with Pressure. The procedure was tolerated well with a pain level of Insensate throughout and a pain level of Insensate following the procedure. Post Debridement Measurements: 1cm length x 0.3cm width x 0.1cm depth; 0.024cm^3 volume. Post debridement Stage noted as Category/Stage III. Character of Wound/Ulcer Post Debridement is improved. Severity of Tissue Post Debridement is: Fat layer exposed. Post procedure Diagnosis Wound #25: Same as Pre-Procedure General Notes: scribed for Dr. Lady Gary by Zenaida Deed, RN. Plan Follow-up Appointments: Return appointment in 1 month. - Dr. Lady Gary with [name] 1 Bathing/ Shower/ Hygiene: May shower and wash wound with soap and water. Edema Control - Lymphedema / SCD / Other: Elevate legs to the level of the heart or above for 30 minutes daily and/or when sitting for 3-4 times a day throughout the day. Patient to wear own compression stockings every day. Moisturize legs daily. Off-Loading: Heel suspension boot to: - Wear the Prevalon boots to both feet at all times WOUND #25: - Lower Leg Wound Laterality: Left, Lateral Prim Dressing: Maxorb Extra Ag+ Alginate Dressing, 2x2 (in/in) Every Other Day/30 Days ary Discharge Instructions: Apply to wound bed as instructed Secondary Dressing: ALLEVYN Gentle Border, 5x5 (in/in) Every Other Day/30 Days Discharge Instructions: Apply over primary dressing as directed. WOUND #26: - Calcaneus Wound Laterality: Left Prim Dressing: Maxorb Extra Ag+ Alginate Dressing, 2x2 (in/in) Every Other Day/30 Days ary Discharge Instructions: Apply to wound bed as instructed Secondary Dressing: ALLEVYN Gentle Border, 5x5 (in/in) Every Other Day/30 Days Discharge Instructions: Apply over primary dressing as directed. [place], [place] Y ([id number]) [id number] Page 9 of 11 07/02/2023: She returns today after a nearly 44-month absence. The wounds look remarkably better. The heel is nearly closed  with just a tiny superficial slit remaining. The ankle wound has also filled in considerably and has just some light slough on the surface. I used a curette to debride slough from the ankle wound. She reports that her home health nurse has been using Santyl and silver alginate to both sites. This is clearly been working very well and I recommended that she continue this. She will follow-up in 1 month. Electronic Signature(s) Signed: 07/02/2023 11:47:15 AM By: Duanne Guess MD FACS Entered By: Duanne Guess on 07/02/2023 11:47:14 -------------------------------------------------------------------------------- HxROS Details Patient Name: Date of Service: [name], [name] 07/02/2023 10:30 A M Medical Record Number: [id number] Patient Account Number: [id number] Date of Birth/Sex: Treating RN: 05/24/1956 (67 y.o. 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 Ear/Nose/Mouth/Throat Complaints and Symptoms: Negative for: Chronic sinus problems or rhinitis Respiratory Complaints and Symptoms: Negative for: Chronic or frequent coughs; Shortness of Breath Cardiovascular Complaints and Symptoms: Negative for: Chest pain Medical History: Positive for: Deep Vein Thrombosis; Hypotension Gastrointestinal Complaints and Symptoms: Negative for: Frequent diarrhea; Nausea; Vomiting Endocrine [place], [place] Y (  [id number])  [id number] Page 10 of 11 Complaints and Symptoms: Negative for: Heat/cold intolerance Medical History: Negative for: Type I Diabetes; Type II Diabetes Past Medical History Notes: hypothyroidism Genitourinary Complaints and Symptoms: Negative for: Frequent urination Medical History: Negative for: End Stage Renal Disease Past Medical History Notes: Atrophy of left kidney Chronic cystitis Retention of urine. Suprapubic catheter Integumentary (Skin) Complaints and Symptoms: Positive for: Wounds Medical History: Negative for: History of Burn Musculoskeletal Complaints and Symptoms: Positive for: Muscle Weakness Medical History: Past Medical History Notes: Osteoporosis, hx decubitis ulcer of sacral region (stage 1); Neurologic Complaints and Symptoms: Positive for: Numbness/parasthesias Medical History: Positive for: Neuropathy - arm; Quadriplegia Negative for: Dementia; Paraplegia; Seizure Disorder Psychiatric Complaints and Symptoms: Negative for: Claustrophobia Medical History: Negative for: Anorexia/bulimia; Confinement Anxiety Hematologic/Lymphatic Medical History: Past Medical History Notes: On Blood Thinners (Coumadin) Immunological Oncologic Medical History: Negative for: Received Chemotherapy; Received Radiation HBO Extended History Items Eyes: Cataracts Immunizations Pneumococcal Vaccine: Received Pneumococcal Vaccination: Yes Received Pneumococcal Vaccination On or After 60th Birthday: No Tetanus Vaccine: Last tetanus shot: 12/04/2010 Implantable Devices [place], [place] Y ([id number]) [phone].pdf Page 11 of 11 No devices added Family and Social History Unknown History: Yes; Never smoker; Marital Status - Divorced; Alcohol Use: Never; Drug Use: No History; Caffeine Use: Rarely; Financial Concerns: No; Food, Clothing or Shelter Needs: No; Support System Lacking: No; Transportation Concerns: Yes -  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 07/02/2023 Medical Record Number: [id number] Patient Account Number: [id number] Date of Birth/Sex: Treating RN: 07/20/1956 (67 y.o. [name] Primary Care Provider: Saralyn Pilar Other Clinician: Referring Provider: Treating Provider/Extender: Harrie Jeans Weeks in Treatment: 0 Diagnosis Coding ICD-10 Codes Code Description [phone] Pressure ulcer of left heel, stage 3 L97.828 Non-pressure chronic ulcer of other part of left lower leg with other specified severity S14.109S Unspecified injury at unspecified level of cervical spinal cord, sequela Facility Procedures : CPT4 Code: 60109323 Description: 99213 - WOUND CARE VISIT-LEV 3 EST PT Modifier: 25 Quantity: 1 : CPT4 Code: [id number] Description: 97597 - DEBRIDE WOUND 1ST 20 SQ CM OR < ICD-10 Diagnosis Description L97.828 Non-pressure chronic ulcer of other part of left lower leg with other specified se Modifier: verity Quantity: 1 Physician Procedures : CPT4 Code Description Modifier [id number] 99214 - WC PHYS LEVEL 4 - EST PT 25 ICD-10 Diagnosis Description L89.623 Pressure ulcer of left heel, stage 3 L97.828 Non-pressure chronic ulcer of other part of left lower leg with other specified severity  S14.109S Unspecified injury at unspecified level of cervical spinal cord, sequela Quantity: 1 : [id number] 97597 - WC PHYS DEBR WO ANESTH 20 SQ CM ICD-10 Diagnosis Description L97.828 Non-pressure chronic ulcer of other part of left lower leg with other specified severity Quantity: 1 Electronic Signature(s) Signed: 07/02/2023 12:47:39 PM By:  Duanne Guess MD FACS Signed: 07/02/2023 5:48:36 PM By: Zenaida Deed RN, BSN Previous Signature: 07/02/2023 11:48:14 AM Version By: Duanne Guess MD FACS Entered By: Zenaida Deed on 07/02/2023 12:21:35

## 2023-07-03 NOTE — Progress Notes (Signed)
[place], [place] Y ([id number]) [phone] [id number] Page 1 of 4 Visit Report for 07/02/2023 Abuse Risk Screen Details Patient Name: Date of Service: [place], [name] 07/02/2023 10:30 A M Medical Record Number: [id number] Patient Account Number: [id number] Date of Birth/Sex: Treating RN: February 11, 1956 (67 y.o. [name] Primary Care Clea Dubach: Saralyn Pilar Other Clinician: Referring Dominigue Gellner: Treating Myrl Bynum/Extender: Harrie Jeans Weeks in Treatment: 0 Abuse Risk Screen Items Answer ABUSE RISK SCREEN: Has anyone close to you tried to hurt or harm you recentlyo No Do you feel uncomfortable with anyone in your familyo No Has anyone forced you do things that you didnt want to doo No Electronic Signature(s) Signed: 07/02/2023 5:48:36 PM By: Zenaida Deed RN, BSN Entered By: Zenaida Deed on 07/02/2023 11:05:14 -------------------------------------------------------------------------------- Activities of Daily Living Details Patient Name: Date of Service: [place], [place] Y. 07/02/2023 10:30 A M Medical Record Number: [id number] Patient Account Number: [id number] Date of Birth/Sex: Treating RN: [birth year] (67 y.o. [name] Primary Care Ajooni Karam: Saralyn Pilar Other Clinician: Referring Joel Mericle: Treating Ineze Serrao/Extender: Harrie Jeans Weeks in Treatment: 0 Activities of Daily Living Items Answer Activities of Daily Living (Please select one for each item) Drive Automobile Not Able T Medications ake Need Assistance Use T elephone Completely Able Care for Appearance Need Assistance Use T oilet Need Assistance Bath / Shower Need Assistance Dress Self Need Assistance Feed Self Completely Able Walk Not Able Get In / Out Bed Need Assistance Housework Need Assistance Prepare Meals Need Assistance Handle Money Completely Able Shop for Self Need Assistance Electronic Signature(s) Signed:  07/02/2023 5:48:36 PM By: Zenaida Deed RN, BSN Entered By: Zenaida Deed on 07/02/2023 11:06:33 [place], [place] Y ([id number]) [id number] Page 2 of 4 -------------------------------------------------------------------------------- Education Screening Details Patient Name: Date of Service: [name], [place] YSAY T. 07/02/2023 10:30 A M Medical Record Number: [id number] Patient Account Number: [id number] Date of Birth/Sex: Treating RN: 1956-08-12 (67 y.o. [name] Primary Care Kaizer Dissinger: Saralyn Pilar Other Clinician: Referring Garald Rhew: Treating Kynslee Baham/Extender: Park Liter in Treatment: 0 Primary Learner Assessed: Patient Learning Preferences/Education Level/Primary Language Learning Preference: Explanation, Printed Material Highest Education Level: High School Preferred Language: English Cognitive Barrier Language Barrier: No Translator Needed: No Memory Deficit: No Emotional Barrier: No Cultural/Religious Beliefs Affecting Medical Care: No Physical Barrier Impaired Vision: Yes Glasses Impaired Hearing: No Decreased Hand dexterity: Yes Limitations: quadraplegic Knowledge/Comprehension Knowledge Level: High Comprehension Level: High Ability to understand written instructions: High Ability to understand verbal instructions: High Motivation Anxiety Level: Calm Cooperation: Cooperative Education Importance: Acknowledges Need Interest in Health Problems: Asks Questions Perception: Coherent Willingness to Engage in Self-Management High Activities: Readiness to Engage in Self-Management High Activities: Electronic Signature(s) Signed: 07/02/2023 5:48:36 PM By: Zenaida Deed RN, BSN Entered By: Zenaida Deed on 07/02/2023 11:07:37 -------------------------------------------------------------------------------- Fall Risk Assessment Details Patient Name: Date of Service: [name], [name] 07/02/2023  10:30 A M Medical Record Number: [id number] Patient Account Number: [id number] Date of Birth/Sex: Treating RN: [birth year] (67 y.o. [name] Primary Care Brentlee Delage: Saralyn Pilar Other Clinician: Referring Neima Lacross: Treating Marquise Lambson/Extender: Harrie Jeans Weeks in Treatment: 0 Fall Risk Assessment Items Have you had 2 or more falls in the last [address] No [place], [place] Y ([id number]) [id number] Page 3 of 4 Have you had any fall that resulted in injury in the last 12 monthso 0 No FALLS RISK SCREEN History of falling - immediate or within 3 months 0 No Secondary diagnosis (Do you have  2 or more medical diagnoseso) 0 No Ambulatory aid None/bed rest/wheelchair/nurse 0 Yes Crutches/cane/walker 0 No Furniture 0 No Intravenous therapy Access/Saline/Heparin Lock 0 No Gait/Transferring Normal/ bed rest/ wheelchair 0 Yes Weak (short steps with or without shuffle, stooped but able to lift head while walking, may seek 0 No support from furniture) Impaired (short steps with shuffle, may have difficulty arising from chair, head down, impaired 0 No balance) Mental Status Oriented to own ability 0 Yes Electronic Signature(s) Signed: 07/02/2023 5:48:36 PM By: Zenaida Deed RN, BSN Entered By: Zenaida Deed on 07/02/2023 11:07:59 -------------------------------------------------------------------------------- Foot Assessment Details Patient Name: Date of Service: [name], [name] 07/02/2023 10:30 A M Medical Record Number: [id number] Patient Account Number: [id number] Date of Birth/Sex: Treating RN: Sep 05, 1956 (67 y.o. [name] Primary Care Shaneal Barasch: Saralyn Pilar Other Clinician: Referring Brette Cast: Treating Carrol Bondar/Extender: Harrie Jeans Weeks in Treatment: 0 Foot Assessment Items Site Locations + = Sensation present, - = Sensation absent, C = Callus, U = Ulcer R = Redness, W =  Warmth, M = Maceration, PU = Pre-ulcerative lesion F = Fissure, S = Swelling, D = Dryness Assessment Right: Left: Other Deformity: No No Prior Foot Ulcer: Yes No Prior Amputation: No No Charcot Joint: No No Ambulatory Status: Non-ambulatory Assistance Device: Wheelchair [place], [place] Y ([id number]) [phone].pdf Page 4 of 4 Gait: Electronic Signature(s) Signed: 07/02/2023 5:48:36 PM By: Zenaida Deed RN, BSN Entered By: Zenaida Deed on 07/02/2023 11:09:54 -------------------------------------------------------------------------------- Nutrition Risk Screening Details Patient Name: Date of Service: [name], [place] DGUY Y. 07/02/2023 10:30 A M Medical Record Number: [id number] Patient Account Number: [id number] Date of Birth/Sex: Treating RN: [birth year] (67 y.o. [name] Primary Care Emelie Newsom: Saralyn Pilar Other Clinician: Referring Tresten Pantoja: Treating Kamie Korber/Extender: Harrie Jeans Weeks in Treatment: 0 Height (in): Weight (lbs): Body Mass Index (BMI): Nutrition Risk Screening Items Score Screening NUTRITION RISK SCREEN: I have an illness or condition that made me change the kind and/or amount of food I eat 2 Yes I eat fewer than two meals per day 0 No I eat few fruits and vegetables, or milk products 0 No I have three or more drinks of beer, liquor or wine almost every day 0 No I have tooth or mouth problems that make it hard for me to eat 0 No I don't always have enough money to buy the food I need 0 No I eat alone most of the time 1 Yes I take three or more different prescribed or over-the-counter drugs a day 1 Yes Without wanting to, I have lost or gained 10 pounds in the last six months 0 No I am not always physically able to shop, cook and/or feed myself 2 Yes Nutrition Protocols Good Risk Protocol Moderate Risk Protocol High Risk Proctocol 0 Provide education on nutrition Risk Level: High Risk Score:  6 Electronic Signature(s) Signed: 07/02/2023 5:48:36 PM By: Zenaida Deed RN, BSN Entered By: Zenaida Deed on 07/02/2023 11:08:59

## 2023-07-10 ENCOUNTER — Telehealth: Payer: Self-pay

## 2023-07-10 NOTE — Telephone Encounter (Signed)
-----   Message from Nurse Jola Baptist sent at 07/10/2023  9:35 AM EDT ----- Please see [facility] quality team note

## 2023-07-10 NOTE — Progress Notes (Signed)
[facility] Quality Team Note  Name: [name] Date of Birth: February 01, 1956 MRN: [id number] Date: 07/10/2023  [facility] Quality Team has reviewed this patient's chart, please see recommendations below:  AWV; Patient requests provider to schedule AWV. Please call patient back with appointment details.

## 2023-07-16 DIAGNOSIS — R339 Retention of urine, unspecified: Secondary | ICD-10-CM | POA: Diagnosis not present

## 2023-08-06 ENCOUNTER — Other Ambulatory Visit: Payer: Self-pay | Admitting: Internal Medicine

## 2023-08-06 ENCOUNTER — Other Ambulatory Visit (HOSPITAL_COMMUNITY): Payer: Self-pay

## 2023-08-06 ENCOUNTER — Other Ambulatory Visit: Payer: Self-pay | Admitting: Family Medicine

## 2023-08-06 DIAGNOSIS — G8250 Quadriplegia, unspecified: Secondary | ICD-10-CM

## 2023-08-07 ENCOUNTER — Other Ambulatory Visit (HOSPITAL_COMMUNITY): Payer: Self-pay

## 2023-08-07 ENCOUNTER — Ambulatory Visit (INDEPENDENT_AMBULATORY_CARE_PROVIDER_SITE_OTHER): Payer: HMO | Admitting: Family Medicine

## 2023-08-07 ENCOUNTER — Other Ambulatory Visit: Payer: Self-pay

## 2023-08-07 VITALS — Ht 64.0 in | Wt 175.0 lb

## 2023-08-07 DIAGNOSIS — R82998 Other abnormal findings in urine: Secondary | ICD-10-CM

## 2023-08-07 LAB — POCT URINALYSIS DIP (CLINITEK)
Bilirubin, UA: NEGATIVE
Glucose, UA: NEGATIVE mg/dL
Ketones, POC UA: NEGATIVE mg/dL
Nitrite, UA: NEGATIVE
POC PROTEIN,UA: 100 — AB
Spec Grav, UA: >=1.03 — AB (ref 1.010–1.025)
Urobilinogen, UA: 0.2 U/dL
pH, UA: 7.5 (ref 5.0–8.0)

## 2023-08-07 MED ORDER — WARFARIN SODIUM 4 MG PO TABS
ORAL_TABLET | ORAL | 0 refills | Status: DC
  Filled 2023-08-07: qty 24, 84d supply, fill #0

## 2023-08-07 MED ORDER — NEOMYCIN-POLYMYXIN B GU 40-200000 IR SOLN
5 refills | Status: DC | PRN
  Filled 2023-08-07: qty 20, 15d supply, fill #0

## 2023-08-07 MED ORDER — WARFARIN SODIUM 2 MG PO TABS
ORAL_TABLET | ORAL | 5 refills | Status: DC
  Filled 2023-08-07: qty 30, fill #0
  Filled 2023-08-07: qty 30, 42d supply, fill #0
  Filled 2023-09-15 (×2): qty 30, 30d supply, fill #0

## 2023-08-07 NOTE — Progress Notes (Signed)
Patient urine sample was drop off for a urine culture

## 2023-08-07 NOTE — Telephone Encounter (Signed)
Pt called to say that she is having problems with her coumadin.  It shows that she takes 2mg  and 4mg  [facility] said they did not even have the 4mg  Rx. Pt states she is taking the 2mg  everyday except Mon and Fri, and she takes the 4 on Mon and Fri.  Pharmacy recommends sending in the current regimen for these medications. Please advise.

## 2023-08-07 NOTE — Telephone Encounter (Signed)
Coumadin 2 mg and 4 mg prescriptions have been sent to [facility]

## 2023-08-07 NOTE — Progress Notes (Signed)
Will wait for urine culture results before sending an antibiotic.

## 2023-08-08 ENCOUNTER — Other Ambulatory Visit: Payer: Self-pay

## 2023-08-08 ENCOUNTER — Telehealth: Payer: Self-pay

## 2023-08-08 ENCOUNTER — Other Ambulatory Visit (HOSPITAL_COMMUNITY): Payer: Self-pay

## 2023-08-08 NOTE — Telephone Encounter (Signed)
Pt was calling for Urine culture results.  Please call patient with information.

## 2023-08-09 ENCOUNTER — Other Ambulatory Visit: Payer: Self-pay

## 2023-08-09 NOTE — Telephone Encounter (Signed)
Patient has been informed that results have not been released yet for provider's interpretation and that she will be notified once results are ready. Patient verbalized understanding. All questions and concerns have been addressed.

## 2023-08-09 NOTE — Telephone Encounter (Signed)
We have not received results yet. They will become available in MyChart once they are back, and I will leave a note with them once I see them.

## 2023-08-10 ENCOUNTER — Other Ambulatory Visit: Payer: Self-pay

## 2023-08-12 LAB — URINE CULTURE

## 2023-08-13 ENCOUNTER — Encounter (HOSPITAL_BASED_OUTPATIENT_CLINIC_OR_DEPARTMENT_OTHER): Payer: HMO | Attending: General Surgery | Admitting: General Surgery

## 2023-08-13 DIAGNOSIS — E039 Hypothyroidism, unspecified: Secondary | ICD-10-CM | POA: Diagnosis not present

## 2023-08-13 DIAGNOSIS — Z981 Arthrodesis status: Secondary | ICD-10-CM | POA: Insufficient documentation

## 2023-08-13 DIAGNOSIS — Z7901 Long term (current) use of anticoagulants: Secondary | ICD-10-CM | POA: Diagnosis not present

## 2023-08-13 DIAGNOSIS — L89623 Pressure ulcer of left heel, stage 3: Secondary | ICD-10-CM | POA: Insufficient documentation

## 2023-08-13 DIAGNOSIS — Z86718 Personal history of other venous thrombosis and embolism: Secondary | ICD-10-CM | POA: Insufficient documentation

## 2023-08-13 DIAGNOSIS — L97828 Non-pressure chronic ulcer of other part of left lower leg with other specified severity: Secondary | ICD-10-CM | POA: Diagnosis not present

## 2023-08-13 DIAGNOSIS — G8250 Quadriplegia, unspecified: Secondary | ICD-10-CM | POA: Insufficient documentation

## 2023-08-13 NOTE — Progress Notes (Signed)
[place], [place] Y ([id number]) [id number] Page 1 of 9 Visit Report for 08/13/2023 Arrival Information Details Patient Name: Date of Service: [place], [name] 08/13/2023 12:30 PM Medical Record Number: [id number] Patient Account Number: [id number] Date of Birth/Sex: Treating RN: 09-08-1956 (67 y.o. [name] Primary Care Katreena Schupp: Saralyn Pilar Other Clinician: Referring Zandra Lajeunesse: Treating Shameka Aggarwal/Extender: Park Liter in Treatment: 6 Visit Information History Since Last Visit Added or deleted any medications: No Patient Arrived: Wheel Chair Any new allergies or adverse reactions: No Arrival Time: 12:43 Had a fall or experienced change in No Accompanied By: sister activities of daily living that may affect Transfer Assistance: None risk of falls: Patient Identification Verified: Yes Signs or symptoms of abuse/neglect since last visito No Secondary Verification Process Completed: Yes Hospitalized since last visit: No Patient Requires Transmission-Based Precautions: No Implantable device outside of the clinic excluding No Patient Has Alerts: No cellular tissue based products placed in the center since last visit: Has Dressing in Place as Prescribed: Yes Has Footwear/Offloading in Place as Prescribed: Yes Left: Multipodus Split/Boot Right: Multipodus Split/Boot Pain Present Now: No Notes stays in wheelchair Electronic Signature(s) Signed: 08/13/2023 4:55:31 PM By: Zenaida Deed RN, BSN Entered By: Zenaida Deed on 08/13/2023 09:44:07 -------------------------------------------------------------------------------- Encounter Discharge Information Details Patient Name: Date of Service: Angela Husky, [name] 08/13/2023 12:30 PM Medical Record Number: [id number] Patient Account Number: [id number] Date of Birth/Sex: Treating RN: [birth year] (67 y.o. [name] Primary Care Archibald Marchetta: Saralyn Pilar Other  Clinician: Referring Christiane Sistare: Treating Karlea Mckibbin/Extender: Park Liter in Treatment: 6 Encounter Discharge Information Items Post Procedure Vitals Discharge Condition: Stable Temperature (F): 98.4 Ambulatory Status: Wheelchair Pulse (bpm): 76 Discharge Destination: Home Respiratory Rate (breaths/min): 18 Transportation: Private Auto Blood Pressure (mmHg): 139/67 Accompanied By: sister Schedule Follow-up Appointment: Yes Clinical Summary of Care: Patient Declined Electronic Signature(s) Signed: 08/13/2023 4:55:31 PM By: Zenaida Deed RN, BSN Entered By: Zenaida Deed on 08/13/2023 10:22:50 [place], [place] Y ([id number]) [id number] Page 2 of 9 -------------------------------------------------------------------------------- Lower Extremity Assessment Details Patient Name: Date of Service: [name], [name] 08/13/2023 12:30 PM Medical Record Number: [id number] Patient Account Number: [id number] Date of Birth/Sex: Treating RN: [birth year] (67 y.o. [name] Primary Care Charo Philipp: Saralyn Pilar Other Clinician: Referring Avyan Livesay: Treating Kenshawn Maciolek/Extender: Harrie Jeans Weeks in Treatment: 6 Edema Assessment Assessed: [Left: No] [Right: No] Edema: [Left: Ye] [Right: s] Calf Left: Right: Point of Measurement: From Medial Instep 21 cm Ankle Left: Right: Point of Measurement: From Medial Instep 19.3 cm Vascular Assessment Pulses: Dorsalis Pedis Palpable: [Left:Yes] Extremity colors, hair growth, and conditions: Extremity Color: [Left:Normal] Hair Growth on Extremity: [Left:Yes] Temperature of Extremity: [Left:Warm] Capillary Refill: [Left:< 3 seconds] Dependent Rubor: [Left:No No] Electronic Signature(s) Signed: 08/13/2023 4:55:31 PM By: Zenaida Deed RN, BSN Entered By: Zenaida Deed on 08/13/2023  10:01:25 -------------------------------------------------------------------------------- Multi Wound Chart Details Patient Name: Date of Service: Angela Husky, [name] 08/13/2023 12:30 PM Medical Record Number: [id number] Patient Account Number: [id number] Date of Birth/Sex: Treating RN: 1956-11-30 (67 y.o. F) Primary Care Monya Kozakiewicz: Saralyn Pilar Other Clinician: Referring Sidra Oldfield: Treating Kolby Schara/Extender: Park Liter in Treatment: 6 Vital Signs Height(in): Pulse(bpm): 76 Weight(lbs): Blood Pressure(mmHg): 139/67 Body Mass Index(BMI): Temperature(F): 98.4 Respiratory Rate(breaths/min): [address] Y ([id number]) [25:Photos:] [N/[id number] Page 3 of 9 N/A] Left, Lateral Lower Leg Left Calcaneus N/A Wound Location: Pressure Injury Pressure Injury N/A Wounding Event: Pressure Ulcer Pressure Ulcer N/A Primary Etiology: Venous Leg Ulcer N/A N/A Secondary Etiology: Cataracts,  Deep Vein Thrombosis, Cataracts, Deep Vein Thrombosis, N/A Comorbid History: Hypotension, Neuropathy, QuadriplegiaHypotension, Neuropathy, Quadriplegia 06/03/2021 06/03/2021 N/A Date Acquired: 6 6 N/A Weeks of Treatment: Open Open N/A Wound Status: No No N/A Wound Recurrence: 0.1x0.1x0.1 1.2x1.4x0.1 N/A Measurements L x W x D (cm) 0.008 1.319 N/A A (cm) : rea 0.001 0.132 N/A Volume (cm) : 96.60% -4154.80% N/A % Reduction in A rea: 97.90% -4300.00% N/A % Reduction in Volume: Category/Stage III Category/Stage III N/A Classification: Small Medium N/A Exudate A mount: Serous Serosanguineous N/A Exudate Type: amber red, brown N/A Exudate Color: Flat and Intact Flat and Intact N/A Wound Margin: None Present (0%) Large (67-100%) N/A Granulation A mount: N/A Red, Friable N/A Granulation Quality: None Present (0%) Small (1-33%) N/A Necrotic A mount: Fat Layer (Subcutaneous Tissue): Yes Fat Layer (Subcutaneous Tissue): Yes N/A Exposed  Structures: Fascia: No Fascia: No Tendon: No Tendon: No Muscle: No Muscle: No Joint: No Joint: No Bone: No Bone: No Large (67-100%) Small (1-33%) N/A Epithelialization: N/A Debridement - Selective/Open Wound N/A Debridement: Pre-procedure Verification/Time Out N/A 13:10 N/A Taken: N/A Slough N/A Tissue Debrided: N/A Non-Viable Tissue N/A Level: N/A 1.32 N/A Debridement A (sq cm): rea N/A Curette N/A Instrument: N/A Minimum N/A Bleeding: N/A Pressure N/A Hemostasis A chieved: N/A Insensate N/A Procedural Pain: N/A Insensate N/A Post Procedural Pain: N/A Procedure was tolerated well N/A Debridement Treatment Response: N/A 1.2x1.4x0.1 N/A Post Debridement Measurements L x W x D (cm) N/A 0.132 N/A Post Debridement Volume: (cm) N/A Category/Stage III N/A Post Debridement Stage: No Abnormalities Noted Scarring: Yes N/A Periwound Skin Texture: No Abnormalities Noted No Abnormalities Noted N/A Periwound Skin Moisture: No Abnormalities Noted No Abnormalities Noted N/A Periwound Skin Color: No Abnormality No Abnormality N/A Temperature: N/A Debridement N/A Procedures Performed: Treatment Notes Wound #25 (Lower Leg) Wound Laterality: Left, Lateral Cleanser Peri-Wound Care Topical Primary Dressing Maxorb Extra Ag+ Alginate Dressing, 2x2 (in/in) Discharge Instruction: Apply to wound bed as instructed Secondary Dressing ALLEVYN Gentle Border, 5x5 (in/in) Discharge Instruction: Apply over primary dressing as directed. Secured With [employer], [place] Y ([id number]) [id number] Page 4 of 9 Compression Stockings Add-Ons Wound #26 (Calcaneus) Wound Laterality: Left Cleanser Peri-Wound Care Topical Primary Dressing Maxorb Extra Ag+ Alginate Dressing, 2x2 (in/in) Discharge Instruction: Apply to wound bed as instructed Secondary Dressing ALLEVYN Gentle Border, 5x5 (in/in) Discharge Instruction: Apply over primary dressing  as directed. Secured With Compression Wrap Compression Stockings Facilities manager) Signed: 08/13/2023 1:34:47 PM By: Duanne Guess MD FACS Entered By: Duanne Guess on 08/13/2023 10:34:47 -------------------------------------------------------------------------------- Multi-Disciplinary Care Plan Details Patient Name: Date of Service: [name], [place] RTHA Y. 08/13/2023 12:30 PM Medical Record Number: [id number] Patient Account Number: [id number] Date of Birth/Sex: Treating RN: [birth year] (67 y.o. [name] Primary Care Milyn Stapleton: Saralyn Pilar Other Clinician: Referring Vianny Schraeder: Treating Linken Mcglothen/Extender: Park Liter in Treatment: 6 Multidisciplinary Care Plan reviewed with physician Active Inactive Pressure Nursing Diagnoses: Knowledge deficit related to management of pressures ulcers Potential for impaired tissue integrity related to pressure, friction, moisture, and shear Goals: Patient/caregiver will verbalize understanding of pressure ulcer management Date Initiated: 07/02/2023 Target Resolution Date: 09/10/2023 Goal Status: Active Interventions: Assess: immobility, friction, shearing, incontinence upon admission and as needed Assess offloading mechanisms upon admission and as needed Assess potential for pressure ulcer upon admission and as needed Provide education on pressure ulcers Notes: ACIRE, [place] ([id number]) [id number] Page 5 of 9 Nursing Diagnoses: Impaired tissue integrity Knowledge deficit related to ulceration/compromised skin integrity Goals: Patient/caregiver will verbalize  understanding of skin care regimen Date Initiated: 07/02/2023 Target Resolution Date: 09/10/2023 Goal Status: Active Ulcer/skin breakdown will have a volume reduction of 30% by week 4 Date Initiated: 07/02/2023 Date Inactivated: 08/13/2023 Target Resolution Date: 07/30/2023 Goal Status:  Unmet Unmet Reason: pressure relief Interventions: Assess patient/caregiver ability to obtain necessary supplies Assess patient/caregiver ability to perform ulcer/skin care regimen upon admission and as needed Assess ulceration(s) every visit Provide education on ulcer and skin care Treatment Activities: Skin care regimen initiated : 07/02/2023 Topical wound management initiated : 07/02/2023 Notes: Electronic Signature(s) Signed: 08/13/2023 4:55:31 PM By: Zenaida Deed RN, BSN Entered By: Zenaida Deed on 08/13/2023 10:03:08 -------------------------------------------------------------------------------- Pain Assessment Details Patient Name: Date of Service: Angela Husky, [name] 08/13/2023 12:30 PM Medical Record Number: [id number] Patient Account Number: [id number] Date of Birth/Sex: Treating RN: 23-Jan-1956 (67 y.o. [name] Primary Care Acy Orsak: Saralyn Pilar Other Clinician: Referring Merrell Borsuk: Treating Jennfier Abdulla/Extender: Harrie Jeans Weeks in Treatment: 6 Active Problems Location of Pain Severity and Description of Pain Patient Has Paino No Site Locations Rate the pain. Current Pain Level: 0 Pain Management and Medication Current Pain Management: Electronic Signature(s) Signed: 08/13/2023 4:55:31 PM By: Zenaida Deed RN, BSN [place], [place] Y ([id number]) [id number] Page 6 of 9 Entered By: Zenaida Deed on 08/13/2023 09:45:08 -------------------------------------------------------------------------------- Patient/Caregiver Education Details Patient Name: Date of Service: [place], [place] EXBM W. 9/9/2024andnbsp12:30 PM Medical Record Number: [id number] Patient Account Number: [id number] Date of Birth/Gender: Treating RN: 1955-12-27 (67 y.o. [name] Primary Care Physician: Saralyn Pilar Other Clinician: Referring Physician: Treating Physician/Extender: Park Liter in Treatment:  6 Education Assessment Education Provided To: Patient Education Topics Provided Pressure: Methods: Explain/Verbal Responses: Reinforcements needed, State content correctly Wound/Skin Impairment: Methods: Explain/Verbal Responses: Reinforcements needed, State content correctly Electronic Signature(s) Signed: 08/13/2023 4:55:31 PM By: Zenaida Deed RN, BSN Entered By: Zenaida Deed on 08/13/2023 10:03:34 -------------------------------------------------------------------------------- Wound Assessment Details Patient Name: Date of Service: Angela Husky, [name] 08/13/2023 12:30 PM Medical Record Number: [id number] Patient Account Number: [id number] Date of Birth/Sex: Treating RN: [birth year] (67 y.o. [name] Primary Care Gavynn Duvall: Saralyn Pilar Other Clinician: Referring Lizbet Cirrincione: Treating Evalette Montrose/Extender: Harrie Jeans Weeks in Treatment: 6 Wound Status Wound Number: 25 Primary Pressure Ulcer Etiology: Wound Location: Left, Lateral Lower Leg Secondary Venous Leg Ulcer Wounding Event: Pressure Injury Etiology: Date Acquired: 06/03/2021 Wound Status: Open Weeks Of Treatment: 6 Comorbid Cataracts, Deep Vein Thrombosis, Hypotension, Neuropathy, Clustered Wound: No History: [address] [place], [place] Y ([id number]) [id number] Page 7 of 9 Wound Measurements Length: (cm) 0.1 Width: (cm) 0.1 Depth: (cm) 0.1 Area: (cm) 0.008 Volume: (cm) 0.001 % Reduction in Area: 96.6% % Reduction in Volume: 97.9% Epithelialization: Large (67-100%) Tunneling: No Undermining: No Wound Description Classification: Category/Stage III Wound Margin: Flat and Intact Exudate Amount: Small Exudate Type: Serous Exudate Color: amber Foul Odor After Cleansing: No Slough/Fibrino No Wound Bed Granulation Amount: None Present (0%) Exposed Structure Necrotic Amount: None Present (0%) Fascia Exposed: No Fat Layer (Subcutaneous Tissue)  Exposed: Yes Tendon Exposed: No Muscle Exposed: No Joint Exposed: No Bone Exposed: No Periwound Skin Texture Texture Color No Abnormalities Noted: Yes No Abnormalities Noted: Yes Moisture Temperature / Pain No Abnormalities Noted: Yes Temperature: No Abnormality Treatment Notes Wound #25 (Lower Leg) Wound Laterality: Left, Lateral Cleanser Peri-Wound Care Topical Primary Dressing Maxorb Extra Ag+ Alginate Dressing, 2x2 (in/in) Discharge Instruction: Apply to wound bed as instructed Secondary Dressing ALLEVYN Gentle Border, 5x5 (in/in) Discharge Instruction: Apply over primary dressing as directed.  Secured With Compression Wrap Compression Stockings Facilities manager) Signed: 08/13/2023 4:55:31 PM By: Zenaida Deed RN, BSN Entered By: Zenaida Deed on 08/13/2023 09:58:26 [place], [place] Y ([id number]) [id number] Page 8 of 9 -------------------------------------------------------------------------------- Wound Assessment Details Patient Name: Date of Service: [name], [name] 08/13/2023 12:30 PM Medical Record Number: [id number] Patient Account Number: [id number] Date of Birth/Sex: Treating RN: 1956-09-13 (67 y.o. [name] Primary Care Ameriah Lint: Saralyn Pilar Other Clinician: Referring Ruble Buttler: Treating Chrys Landgrebe/Extender: Harrie Jeans Weeks in Treatment: 6 Wound Status Wound Number: 26 Primary Pressure Ulcer Etiology: Wound Location: Left Calcaneus Wound Status: Open Wounding Event: Pressure Injury Comorbid Cataracts, Deep Vein Thrombosis, Hypotension, Neuropathy, Date Acquired: 06/03/2021 History: Quadriplegia Weeks Of Treatment: 6 Clustered Wound: No Photos Wound Measurements Length: (cm) 1 Width: (cm) 1 Depth: (cm) 0 Area: (cm) Volume: (cm) .2 % Reduction in Area: -4154.8% .4 % Reduction in Volume: -4300% .1 Epithelialization: Small (1-33%) 1.319 Tunneling: No 0.132 Undermining:  No Wound Description Classification: Category/Stage III Wound Margin: Flat and Intact Exudate Amount: Medium Exudate Type: Serosanguineous Exudate Color: red, brown Foul Odor After Cleansing: No Slough/Fibrino Yes Wound Bed Granulation Amount: Large (67-100%) Exposed Structure Granulation Quality: Red, Friable Fascia Exposed: No Necrotic Amount: Small (1-33%) Fat Layer (Subcutaneous Tissue) Exposed: Yes Necrotic Quality: Adherent Slough Tendon Exposed: No Muscle Exposed: No Joint Exposed: No Bone Exposed: No Periwound Skin Texture Texture Color No Abnormalities Noted: No No Abnormalities Noted: Yes Scarring: Yes Temperature / Pain Temperature: No Abnormality Moisture No Abnormalities Noted: Yes Treatment Notes Wound #26 (Calcaneus) Wound Laterality: Left [place], [place] Y ([id number]) [id number] Page 9 of 9 Peri-Wound Care Topical Primary Dressing Maxorb Extra Ag+ Alginate Dressing, 2x2 (in/in) Discharge Instruction: Apply to wound bed as instructed Secondary Dressing ALLEVYN Gentle Border, 5x5 (in/in) Discharge Instruction: Apply over primary dressing as directed. Secured With Compression Wrap Compression Stockings Facilities manager) Signed: 08/13/2023 4:55:31 PM By: Zenaida Deed RN, BSN Entered By: Zenaida Deed on 08/13/2023 10:00:06 -------------------------------------------------------------------------------- Vitals Details Patient Name: Date of Service: Angela Husky, [name] 08/13/2023 12:30 PM Medical Record Number: [id number] Patient Account Number: [id number] Date of Birth/Sex: Treating RN: September 01, 1956 (67 y.o. [name] Primary Care Veronia Laprise: Saralyn Pilar Other Clinician: Referring Stalin Gruenberg: Treating Tonni Mansour/Extender: Harrie Jeans Weeks in Treatment: 6 Vital Signs Time Taken: 12:44 Temperature (F): 98.4 Pulse (bpm): 76 Respiratory Rate (breaths/min): 18 Blood  Pressure (mmHg): 139/67 Reference Range: 80 - 120 mg / dl Electronic Signature(s) Signed: 08/13/2023 4:55:31 PM By: Zenaida Deed RN, BSN Entered By: Zenaida Deed on 08/13/2023 09:45:27

## 2023-08-13 NOTE — Progress Notes (Signed)
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 Achilles. She has been using Keystone antibiotic and Hydrofera Blue. Home health has been changing the dressing. She reports that 1 nurse has been cutting the Hydrofera Blue to the size of the wound and saturating the wound with [facility] antibiotic prior to applying it; the other nurse has been using an entire square of Hydrofera Blue and using a very minimal amount of the antibiotic. There appears to be some disconnect in the instructions. The wounds are may be a little bit smaller. There remains necrotic tissue in the calcaneal wound, along with adherent slough. The Achilles wound is better with just some slough present. 02/20/2022: I took another culture at her last visit. This showed only some yeast, no bacteria. She has still been using the [facility] antibiotic and Hydrofera Blue. She is concerned about the cost of her biweekly visits and the debridements that occur. She is wondering if we can go back to [facility] and stretch her visits out to once a month. 03/20/2022: At  her last visit, per her request, we changed her dressing back to [facility] under Hydrofera Blue. She reports that her home health nurses continue to not cut the [facility] Blue to fit the wound and instead just place it over the wound. The Santyl is being applied to the [facility] and so the patient does not think it is actually contacting her wounds. 04/17/2022: Today, both wounds are bigger and there was a strong odor on her dressings. There is also some greenish discharge present. We have been using Santyl under Hydrofera Blue. The patient has numerous complaints about her home health care providers today, but this is fairly typical. 05/15/2022: Last visit, I was concerned for infection and took a culture. Based upon these results, we ordered a new Keystone topical antibiotic. We have been using that with silver alginate on her wounds. Both wounds are smaller today. The intake nurse was concerned about some black-looking discoloration on her heel. On further inspection, it actually ended up being old hematoma. 06/12/2022: Both wounds measure a little bit smaller today. They are both fairly friable and bleed easily with manipulation. The heel wound has some additional discolored and boggy tissue on the more distal aspect along with some accumulated slough. The posterior ankle wound now has Achilles tendon exposed, which I do not think has  been documented in the past. We are using Keystone topical antibiotic and silver alginate. 07/11/2022: Both wounds are little smaller again today. The boggy tissue on the heel was debrided at our last visit. She still has some slough buildup on both wound surfaces. The Achilles tendon remains exposed at the ankle wound. Her Jodie Echevaria compound was unfortunately compromised with moisture and is not usable any longer. She has had additional disagreements with her home health care providers and may not be able to receive their services going forward. 08/14/2022: No change in  the wound sizes today but the heel wound is quite superficial. Both are very clean. Tendon is still exposed at the ankle. We have been using silver alginate. She is concerned about some redness on her lower leg adjacent to the ankle wound. 09/11/2022: The heel wound is a little larger today. Both wounds have slough accumulation. Tendon remains exposed at the ankle. 10/09/2022: The heel wound looks better today and has filled to the point that it is flush with the surrounding tissue. There is still tendon exposure at the ankle. No real slough accumulation, but both wounds are drier than ideal. 11/06/2022: The heel wound continues to improve. It is still fairly superficial but has some nonviable subcutaneous tissue present, along with slough. The ankle wound is smaller, but the exposed tendon is necrotic. The moisture balance has improved markedly in both locations. She was approved for TheraSkin, but the co-pay was $350 and cost prohibitive for her. 12/11/2022: Both the ankle and heel wounds are a bit smaller today. There is still nonviable exposed tendon at the ankle. She has a new wound on the anterior tibial surface of the left leg. It looks as though it may have been a blister. It is clean and limited to breakdown of skin. 01/08/2023: The anterior tibial surface wound has healed. Both ankle and heel wounds have deteriorated over the last month. The heel shows signs of pressure induced tissue damage. There is slough and nonviable subcutaneous tissue at both sites. The tendon at the ankle looks worse. 3/18; patient presents for follow-up. She has been using Hydrofera Blue to the wound sites. Overall there is been improvement since last clinic visit. Patient has no issues or complaints today. [place], [name] ([id number]) [id number] Page 7 of 11 03/19/2023: The tendon is now covered on the lateral leg wound and the wound is considerably smaller. The heel also looks better. There is  just 1 small area at the most proximal posterior aspect of the wound that shows evidence of ongoing pressure-induced tissue injury. 04/09/2023: The heel has a fair amount of slough accumulation and despite all efforts to identify and alleviate sources of potential pressure, there is still pressure induced tissue injury occurring. The lateral ankle wound is quite a bit smaller, but still has some depth to it. 07/02/2023: She returns today after a nearly 28-month absence. The wounds look remarkably better. The heel is nearly closed with just a tiny superficial slit remaining. The ankle wound has also filled in considerably and has just some light slough on the surface. 08/13/2023: The heel wound got a little bit larger, but there is no longer any indication of pressure-induced tissue injury. There is thin slough on the surface. The ankle wound is nearly closed; in fact the only suggestion that it remains open was a small spot of drainage on the silver alginate that had been applied. Patient History Information obtained from Patient, Chart. Family History Unknown History. Social History Never smoker, Marital  Status - Divorced, Alcohol Use - Never, Drug Use - No History, Caffeine Use - Rarely. Medical History Eyes Patient has history of Cataracts - Cataracts in both eyes Cardiovascular Patient has history of Deep Vein Thrombosis, Hypotension Endocrine Denies history of Type I Diabetes, Type II Diabetes Genitourinary Denies history of End Stage Renal Disease Integumentary (Skin) Denies history of History of Burn Neurologic Patient has history of Neuropathy - arm, Quadriplegia Denies history of Dementia, Paraplegia, Seizure Disorder Oncologic Denies history of Received Chemotherapy, Received Radiation Psychiatric Denies history of Anorexia/bulimia, Confinement Anxiety Medical A Surgical History Notes nd Constitutional Symptoms (General Health) obesity Eyes 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 Suprapubic catheter Musculoskeletal Osteoporosis, hx decubitis ulcer of sacral region (stage 1); Objective Constitutional no acute distress. Vitals Time Taken: 12:44 PM, Temperature: 98.4 F, Pulse: 76 bpm, Respiratory Rate: 18 breaths/min, Blood Pressure: 139/67 mmHg. Respiratory Normal work of breathing on room air. General Notes: 08/13/2023: The heel wound got a little bit larger, but there is no longer any indication of pressure-induced tissue injury. There is thin slough on the surface. The ankle wound is nearly closed; in fact the only suggestion that it remains open was a small spot of drainage on the silver alginate that had been applied. Integumentary (Hair, Skin) Wound #25 status is Open. Original cause of wound was Pressure Injury. The date acquired was: 06/03/2021. The wound has been in Walker 6 Walker. The wound is located on the Left,Lateral Lower Leg. The wound measures 0.1cm length x 0.1cm width x 0.1cm depth; 0.008cm^2 area and 0.001cm^3 volume. There is Fat Layer (Subcutaneous Tissue) exposed. There is no tunneling or undermining noted. There is a small amount of serous drainage noted. The wound margin is flat and intact. There is no granulation within the wound bed. There is no necrotic tissue within the wound bed. The periwound skin appearance had no abnormalities noted for texture. The periwound skin appearance had no abnormalities noted for moisture. The periwound skin appearance had no abnormalities noted for color. Periwound temperature was noted as No Abnormality. Wound #26 status is Open. Original cause of wound was Pressure Injury. The date acquired was: 06/03/2021. The wound has been in Walker 6 Walker. [facility] ([id number]) [id number] Page 8 of 11 wound is located on the  Left Calcaneus. The wound measures 1.2cm length x 1.4cm width x 0.1cm depth; 1.319cm^2 area and 0.132cm^3 volume. There is Fat Layer (Subcutaneous Tissue) exposed. There is no tunneling or undermining noted. There is a medium amount of serosanguineous drainage noted. The wound margin is flat and intact. There is large (67-100%) red, friable granulation within the wound bed. There is a small (1-33%) amount of necrotic tissue within the wound bed including Adherent Slough. The periwound skin appearance had no abnormalities noted for moisture. The periwound skin appearance had no abnormalities noted for color. The periwound skin appearance exhibited: Scarring. Periwound temperature was noted as No Abnormality. Assessment Active Problems ICD-10 Pressure ulcer of left heel, stage 3 Non-pressure chronic ulcer of other part of left lower leg with other specified severity Unspecified injury at unspecified level of cervical spinal cord, sequela Procedures Wound #26 Pre-procedure diagnosis of Wound #26 is a Pressure Ulcer located on the Left Calcaneus . There was a Selective/Open Wound Non-Viable Tissue Debridement with a total area of 1.32 sq cm performed by Angela Guess, MD. With the following instrument(s): Curette to remove  Non-Viable tissue/material. Material removed includes Slough. No specimens were taken. A time out was conducted at 13:10, prior to the start of the procedure. A Minimum amount of bleeding was controlled with Pressure. The procedure was tolerated well with a pain level of Insensate throughout and a pain level of Insensate following the procedure. Post Debridement Measurements: 1.2cm length x 1.4cm width x 0.1cm depth; 0.132cm^3 volume. Post debridement Stage noted as Category/Stage III. Character of Wound/Ulcer Post Debridement is improved. Post procedure Diagnosis Wound #26: Same as Pre-Procedure General Notes: scribed for Dr. Lady Walker by Angela Deed, Walker. Plan Follow-up  Appointments: Return appointment in 1 month. - Dr. Lady Walker with [name] 1 Bathing/ Shower/ Hygiene: May shower and wash wound with soap and water. Edema Control - Lymphedema / SCD / Other: Elevate legs to the level of the heart or above for 30 minutes daily and/or when sitting for 3-4 times a day throughout the day. Patient to wear own compression stockings every day. Moisturize legs daily. Off-Loading: Heel suspension boot to: - Wear the Prevalon boots to both feet at all times Low air-loss mattress (Group 2) WOUND #25: - Lower Leg Wound Laterality: Left, Lateral Prim Dressing: Maxorb Extra Ag+ Alginate Dressing, 2x2 (in/in) Every Other Day/30 Days ary Discharge Instructions: Apply to wound bed as instructed Secondary Dressing: ALLEVYN Gentle Border, 5x5 (in/in) Every Other Day/30 Days Discharge Instructions: Apply over primary dressing as directed. WOUND #26: - Calcaneus Wound Laterality: Left Prim Dressing: Maxorb Extra Ag+ Alginate Dressing, 2x2 (in/in) Every Other Day/30 Days ary Discharge Instructions: Apply to wound bed as instructed Secondary Dressing: ALLEVYN Gentle Border, 5x5 (in/in) Every Other Day/30 Days Discharge Instructions: Apply over primary dressing as directed. 08/13/2023: The heel wound got a little bit larger, but there is no longer any indication of pressure-induced tissue injury. There is thin slough on the surface. The ankle wound is nearly closed; in fact the only suggestion that it remains open was a small spot of drainage on the silver alginate that had been applied. I used a curette to debride the slough off of the heel wound. The ankle did not require any debridement. We will continue to apply silver alginate to both sites and continue taking precautions to avoid any pressure to either location. As is her preference, she will follow-up in 1 month. Electronic Signature(s) Signed: 08/13/2023 1:41:45 PM By: Angela Guess MD FACS Entered By: [name] on  08/13/2023 10:41:45 [place], [place] Walker ([id number]) [id number] Page 9 of 11 -------------------------------------------------------------------------------- HxROS Details Patient Name: Date of Service: [name], [name] 08/13/2023 12:30 PM Medical Record Number: [id number] Patient Account Number: [id number] Date of Birth/Sex: Treating Walker: June 02, 1956 (45 Walker.o. F) Primary Care Provider: Saralyn Walker Other Clinician: Referring Provider: Treating Provider/Extender: [name]: 6 Information Obtained From Patient Chart Constitutional Symptoms (General Health) Medical History: Past Medical History Notes: obesity Eyes Medical History: Positive for: Cataracts - Cataracts in both eyes Past Medical History Notes: Right eye is a Lazy Eye Hematologic/Lymphatic Medical History: Past Medical History Notes: On Blood Thinners (Coumadin) Cardiovascular Medical History: Positive for: Deep Vein Thrombosis; Hypotension Endocrine Medical History: Negative for: Type I Diabetes; Type II Diabetes Past Medical History Notes: hypothyroidism Genitourinary Medical History: Negative for: End Stage Renal Disease Past Medical History Notes: Atrophy of left kidney Chronic cystitis Retention of urine. Suprapubic catheter Integumentary (Skin) Medical History: Negative for: History of Burn Musculoskeletal Medical History: Past Medical History Notes: Osteoporosis, hx decubitis ulcer of sacral region (stage 1); Neurologic  Medical History: Positive for: Neuropathy - arm; Quadriplegia Negative for: Dementia; Paraplegia; Seizure Disorder Oncologic Medical History: Negative for: Received Chemotherapy; Received Radiation [name], [name] ([id number]) [id number] Page 10 of 11 Psychiatric Medical History: Negative for: Anorexia/bulimia; Confinement Anxiety HBO Extended History  Items Eyes: Cataracts Immunizations Pneumococcal Vaccine: Received Pneumococcal Vaccination: Yes Received Pneumococcal Vaccination On or After 60th Birthday: No Tetanus Vaccine: Last tetanus shot: 12/04/2010 Implantable Devices No devices added Family and Social History Unknown History: Yes; Never smoker; Marital Status - Divorced; Alcohol Use: Never; Drug Use: No History; Caffeine Use: Rarely; Financial Concerns: No; Food, Clothing or Shelter Needs: No; Support System Lacking: No; Transportation Concerns: Yes - relies on family for Advertising copywriter) Signed: 08/13/2023 2:05:59 PM By: Angela Guess MD FACS Entered By: [name] on 08/13/2023 10:40:06 -------------------------------------------------------------------------------- SuperBill Details Patient Name: Date of Service: Angela Husky, MA RTHA Walker. 08/13/2023 Medical Record Number: [id number] Patient Account Number: [id number] Date of Birth/Sex: Treating Walker: 1956-09-19 (19 Walker.o. F) Primary Care Provider: Saralyn Walker Other Clinician: Referring Provider: Treating Provider/Extender: [name]: 6 Diagnosis Coding ICD-10 Codes Code Description [phone] Pressure ulcer of left heel, stage 3 L97.828 Non-pressure chronic ulcer of other part of left lower leg with other specified severity S14.109S Unspecified injury at unspecified level of cervical spinal cord, sequela Facility Procedures : CPT4 Code: [id number] Description: 97597 - DEBRIDE WOUND 1ST 20 SQ CM OR < ICD-10 Diagnosis Description L89.623 Pressure ulcer of left heel, stage 3 Modifier: Quantity: 1 Physician Procedures : CPT4 Code Description Modifier [id number] - WC PHYS LEVEL 3 - EST PT 25 ICD-10 Diagnosis Description L89.623 Pressure ulcer of left heel, stage 3 L97.828 Non-pressure chronic ulcer of other part of left lower leg with other specified severity  S14.109S Unspecified injury at unspecified level  of cervical spinal cord, sequela Quantity: 1 : [id number] 97597 - WC PHYS DEBR WO ANESTH 20 SQ CM ICD-10 Diagnosis Description [name], [name] ([id number]) [id number] ICD-10 Diagnosis Description [phone] Pressure ulcer of left heel, stage 3 Quantity: 1 Page 11 of 11 Electronic Signature(s) Signed: 08/13/2023 1:42:29 PM By: Angela Guess MD FACS Entered By: [name] on 08/13/2023 10:42:29

## 2023-08-14 ENCOUNTER — Other Ambulatory Visit: Payer: Self-pay

## 2023-08-17 DIAGNOSIS — R339 Retention of urine, unspecified: Secondary | ICD-10-CM | POA: Diagnosis not present

## 2023-08-21 ENCOUNTER — Other Ambulatory Visit: Payer: Self-pay

## 2023-08-21 ENCOUNTER — Other Ambulatory Visit: Payer: Self-pay | Admitting: Family Medicine

## 2023-08-21 ENCOUNTER — Other Ambulatory Visit (HOSPITAL_COMMUNITY): Payer: Self-pay

## 2023-08-21 DIAGNOSIS — N319 Neuromuscular dysfunction of bladder, unspecified: Secondary | ICD-10-CM

## 2023-08-21 MED ORDER — PRAVASTATIN SODIUM 40 MG PO TABS
40.0000 mg | ORAL_TABLET | Freq: Every day | ORAL | 1 refills | Status: DC
  Filled 2023-08-21: qty 90, 90d supply, fill #0
  Filled 2023-11-22: qty 90, 90d supply, fill #1

## 2023-08-21 MED ORDER — BACLOFEN 20 MG PO TABS
20.0000 mg | ORAL_TABLET | Freq: Two times a day (BID) | ORAL | 1 refills | Status: DC
  Filled 2023-08-21: qty 180, 90d supply, fill #0
  Filled 2024-01-02: qty 180, 90d supply, fill #1

## 2023-08-21 MED ORDER — POTASSIUM CHLORIDE ER 10 MEQ PO TBCR
10.0000 meq | EXTENDED_RELEASE_TABLET | Freq: Three times a day (TID) | ORAL | 1 refills | Status: DC
  Filled 2023-08-21: qty 270, 90d supply, fill #0

## 2023-09-03 ENCOUNTER — Telehealth: Payer: Self-pay

## 2023-09-03 ENCOUNTER — Other Ambulatory Visit: Payer: Self-pay | Admitting: Family Medicine

## 2023-09-03 DIAGNOSIS — G8250 Quadriplegia, unspecified: Secondary | ICD-10-CM

## 2023-09-03 DIAGNOSIS — N319 Neuromuscular dysfunction of bladder, unspecified: Secondary | ICD-10-CM

## 2023-09-03 NOTE — Telephone Encounter (Signed)
Ann from [facility] stating that at this time they aren't able to accept the referral that was placed today  [name] pt sister have been made aware and she would be calling back once another suggestion can be made.   If another referral can be placed to help the patient please do so.

## 2023-09-04 NOTE — Telephone Encounter (Signed)
If they would like a referral made to [facility] or [facility] I can do that, or I can wait until they find one.

## 2023-09-05 NOTE — Telephone Encounter (Signed)
Referral request for [facility] sent to [facility] to see if they can take the patient.  [facility] declined due to staffing.

## 2023-09-06 DIAGNOSIS — I959 Hypotension, unspecified: Secondary | ICD-10-CM | POA: Diagnosis not present

## 2023-09-06 DIAGNOSIS — E669 Obesity, unspecified: Secondary | ICD-10-CM | POA: Diagnosis not present

## 2023-09-06 DIAGNOSIS — D649 Anemia, unspecified: Secondary | ICD-10-CM | POA: Diagnosis not present

## 2023-09-06 DIAGNOSIS — G629 Polyneuropathy, unspecified: Secondary | ICD-10-CM | POA: Diagnosis not present

## 2023-09-06 DIAGNOSIS — Z604 Social exclusion and rejection: Secondary | ICD-10-CM | POA: Diagnosis not present

## 2023-09-06 DIAGNOSIS — Z87440 Personal history of urinary (tract) infections: Secondary | ICD-10-CM | POA: Diagnosis not present

## 2023-09-06 DIAGNOSIS — Z7901 Long term (current) use of anticoagulants: Secondary | ICD-10-CM | POA: Diagnosis not present

## 2023-09-06 DIAGNOSIS — R339 Retention of urine, unspecified: Secondary | ICD-10-CM | POA: Diagnosis not present

## 2023-09-06 DIAGNOSIS — Z86718 Personal history of other venous thrombosis and embolism: Secondary | ICD-10-CM | POA: Diagnosis not present

## 2023-09-06 DIAGNOSIS — N319 Neuromuscular dysfunction of bladder, unspecified: Secondary | ICD-10-CM | POA: Diagnosis not present

## 2023-09-06 DIAGNOSIS — E039 Hypothyroidism, unspecified: Secondary | ICD-10-CM | POA: Diagnosis not present

## 2023-09-06 DIAGNOSIS — Z6830 Body mass index (BMI) 30.0-30.9, adult: Secondary | ICD-10-CM | POA: Diagnosis not present

## 2023-09-06 DIAGNOSIS — G8250 Quadriplegia, unspecified: Secondary | ICD-10-CM | POA: Diagnosis not present

## 2023-09-06 DIAGNOSIS — K5900 Constipation, unspecified: Secondary | ICD-10-CM | POA: Diagnosis not present

## 2023-09-06 DIAGNOSIS — Z435 Encounter for attention to cystostomy: Secondary | ICD-10-CM | POA: Diagnosis not present

## 2023-09-06 DIAGNOSIS — M810 Age-related osteoporosis without current pathological fracture: Secondary | ICD-10-CM | POA: Diagnosis not present

## 2023-09-10 ENCOUNTER — Encounter (HOSPITAL_BASED_OUTPATIENT_CLINIC_OR_DEPARTMENT_OTHER): Payer: HMO | Attending: General Surgery | Admitting: General Surgery

## 2023-09-10 DIAGNOSIS — L89623 Pressure ulcer of left heel, stage 3: Secondary | ICD-10-CM | POA: Diagnosis not present

## 2023-09-10 DIAGNOSIS — L97828 Non-pressure chronic ulcer of other part of left lower leg with other specified severity: Secondary | ICD-10-CM | POA: Diagnosis not present

## 2023-09-10 DIAGNOSIS — Z7901 Long term (current) use of anticoagulants: Secondary | ICD-10-CM | POA: Insufficient documentation

## 2023-09-10 DIAGNOSIS — G8250 Quadriplegia, unspecified: Secondary | ICD-10-CM | POA: Insufficient documentation

## 2023-09-10 DIAGNOSIS — S14109S Unspecified injury at unspecified level of cervical spinal cord, sequela: Secondary | ICD-10-CM | POA: Diagnosis not present

## 2023-09-10 DIAGNOSIS — L89893 Pressure ulcer of other site, stage 3: Secondary | ICD-10-CM | POA: Diagnosis not present

## 2023-09-10 NOTE — Progress Notes (Signed)
Level: 0 Pain Management and Medication Current Pain Management: Electronic Signature(s) Signed: 09/10/2023 4:52:08 PM By: Zenaida Deed RN, BSN Entered By: Zenaida Deed on 09/10/2023  10:32:11 -------------------------------------------------------------------------------- Patient/Caregiver Education Details Patient Name: Date of Service: Angela Husky, MA Rosezena Sensor 10/7/2024andnbsp1:15 PM Medical Record Number: [id number] Patient Account Number: [id number] Date of Birth/Gender: Treating RN: 08-Oct-1956 (67 y.o. [name] Primary Care Physician: Saralyn Pilar Other Clinician: Referring Physician: Treating Physician/Extender: Park Liter in Treatment: 10 Education Assessment Education Provided To: Patient Education Topics Provided Pressure: Methods: Explain/Verbal Responses: Reinforcements needed, State content correctly Wound/Skin Impairment: Methods: Explain/Verbal Responses: Reinforcements needed, State content correctly Electronic Signature(s) Signed: 09/10/2023 4:52:08 PM By: Zenaida Deed RN, BSN Entered By: Zenaida Deed on 09/10/2023 10:52:56 -------------------------------------------------------------------------------- Wound Assessment Details Patient Name: Date of Service: Angela Husky, [name] 09/10/2023 1:15 PM [place], [place] Y ([id number]) [id number] Page 6 of 8 Medical Record Number: [id number] Patient Account Number: [id number] Date of Birth/Sex: Treating RN: 26-May-1956 (67 y.o. [name] Primary Care Aroldo Galli: Saralyn Pilar Other Clinician: Referring Javonn Gauger: Treating Zamirah Denny/Extender: Harrie Jeans Weeks in Treatment: 10 Wound Status Wound Number: 25 Primary Pressure Ulcer Etiology: Wound Location: Left, Lateral Lower Leg Secondary Venous Leg Ulcer Wounding Event: Pressure Injury Etiology: Date Acquired: 06/03/2021 Wound Status: Open Weeks Of Treatment: 10 Comorbid Cataracts, Deep Vein Thrombosis, Hypotension, Neuropathy, Clustered Wound: No History: Quadriplegia Photos Wound Measurements Length: (cm) 0.9 Width: (cm) 0.6 Depth: (cm) 0.4 Area:  (cm) 0.424 Volume: (cm) 0.17 % Reduction in Area: -79.7% % Reduction in Volume: -261.7% Epithelialization: Small (1-33%) Tunneling: No Undermining: No Wound Description Classification: Category/Stage III Wound Margin: Flat and Intact Exudate Amount: Medium Exudate Type: Serosanguineous Exudate Color: red, brown Foul Odor After Cleansing: No Slough/Fibrino No Wound Bed Granulation Amount: Large (67-100%) Exposed Structure Granulation Quality: Red Fascia Exposed: No Necrotic Amount: None Present (0%) Fat Layer (Subcutaneous Tissue) Exposed: Yes Tendon Exposed: No Muscle Exposed: No Joint Exposed: No Bone Exposed: No Periwound Skin Texture Texture Color No Abnormalities Noted: Yes No Abnormalities Noted: Yes Moisture Temperature / Pain No Abnormalities Noted: Yes Temperature: No Abnormality Treatment Notes Wound #25 (Lower Leg) Wound Laterality: Left, Lateral Cleanser Peri-Wound Care Topical Primary Dressing Promogran Prisma Matrix, 4.34 (sq in) (silver collagen) Discharge Instruction: Moisten collagen with saline or hydrogel Secondary Dressing Zetuvit Plus Silicone Border Dressing 5x5 (in/in) Discharge Instruction: Apply silicone border over primary dressing as directed. [place], [place] Y ([id number]) [id number] Page 7 of 8 Secured With Compression Wrap Compression Stockings Facilities manager) Signed: 09/10/2023 4:52:08 PM By: Zenaida Deed RN, BSN Entered By: Zenaida Deed on 09/10/2023 11:04:37 -------------------------------------------------------------------------------- Wound Assessment Details Patient Name: Date of Service: Angela Husky, [name] 09/10/2023 1:15 PM Medical Record Number: [id number] Patient Account Number: [id number] Date of Birth/Sex: Treating RN: 01/01/1956 (67 y.o. [name] Primary Care Eljay Lave: Saralyn Pilar Other Clinician: Referring Elisha Mcgruder: Treating Zamiya Dillard/Extender: Harrie Jeans Weeks in Treatment: 10 Wound Status Wound Number: 26 Primary Pressure Ulcer Etiology: Wound Location: Left Calcaneus Wound Status: Open Wounding Event: Pressure Injury Comorbid Cataracts, Deep Vein Thrombosis, Hypotension, Neuropathy, Date Acquired: 06/03/2021 History: Quadriplegia Weeks Of Treatment: 10 Clustered Wound: No Photos Wound Measurements Length: (cm) 0.9 Width: (cm) 1.4 Depth: (cm) 0.1 Area: (cm) 0.99 Volume: (cm) 0.099 % Reduction in Area: -3093.5% % Reduction in Volume: -3200% Epithelialization: Small (1-33%) Tunneling: No Undermining: No Wound Description Classification: Category/Stage III Wound Margin: Flat and Intact Exudate Amount: Medium Exudate Type: Serosanguineous Exudate Color: red, brown Foul Odor After  [place], [place] Y ([id number]) [id number] Page 1 of 8 Visit Report for 09/10/2023 Arrival Information Details Patient Name: Date of Service: [name], [name] 09/10/2023 1:15 PM Medical Record Number: [id number] Patient Account Number: [id number] Date of Birth/Sex: Treating RN: 1956/10/07 (67 y.o. [name] Primary Care Marlaine Arey: Saralyn Pilar Other Clinician: Referring Frances Ambrosino: Treating Idabelle Mcpeters/Extender: Park Liter in Treatment: 10 Visit Information History Since Last Visit Added or deleted any medications: No Patient Arrived: Wheel Chair Any new allergies or adverse reactions: No Arrival Time: 13:30 Had a fall or experienced change in No Accompanied By: daughter activities of daily living that may affect Transfer Assistance: None risk of falls: Patient Identification Verified: Yes Signs or symptoms of abuse/neglect since last visito No Secondary Verification Process Completed: Yes Hospitalized since last visit: No Patient Requires Transmission-Based Precautions: No Implantable device outside of the clinic excluding No Patient Has Alerts: No cellular tissue based products placed in the center since last visit: Has Dressing in Place as Prescribed: Yes Has Compression in Place as Prescribed: Yes Has Footwear/Offloading in Place as Prescribed: Yes Left: Multipodus Split/Boot Right: Multipodus Split/Boot Pain Present Now: No Notes stays in wheelchair Electronic Signature(s) Signed: 09/10/2023 4:52:08 PM By: Zenaida Deed RN, BSN Entered By: Zenaida Deed on 09/10/2023 10:31:42 -------------------------------------------------------------------------------- Encounter Discharge Information Details Patient Name: Date of Service: Angela Husky, [name] 09/10/2023 1:15 PM Medical Record Number: [id number] Patient Account Number: [id number] Date of Birth/Sex: Treating RN: [birth year] (67 y.o. [name] Primary Care Tori Cupps: Saralyn Pilar Other Clinician: Referring Maybelline Kolarik: Treating Chas Axel/Extender: Park Liter in Treatment: 10 Encounter Discharge Information Items Post Procedure Vitals Discharge Condition: Stable Temperature (F): 98.5 Ambulatory Status: Wheelchair Respiratory Rate (breaths/min): 18 Discharge Destination: Home Unable to obtain vitals Reason: unable to register BP, pt asymptomatic Transportation: Private Auto Accompanied By: daughter Schedule Follow-up Appointment: Yes Clinical Summary of Care: Patient Declined Electronic Signature(s) Signed: 09/10/2023 4:52:08 PM By: Zenaida Deed RN, BSN Entered By: Zenaida Deed on 09/10/2023 11:15:00 [place], [place] Y ([id number]) [phone].pdf Page 2 of 8 -------------------------------------------------------------------------------- Lower Extremity Assessment Details Patient Name: Date of Service: [name], [name] 09/10/2023 1:15 PM Medical Record Number: [id number] Patient Account Number: [id number] Date of Birth/Sex: Treating RN: 07-11-1956 (67 y.o. [name] Primary Care Dresean Beckel: Saralyn Pilar Other Clinician: Referring Shellie Goettl: Treating Najee Cowens/Extender: Harrie Jeans Weeks in Treatment: 10 Edema Assessment Assessed: Kyra Searles: No] [Right: No] Edema: [Left: Ye] [Right: s] Calf Left: Right: Point of Measurement: From Medial Instep 21 cm Ankle Left: Right: Point of Measurement: From Medial Instep 19.3 cm Vascular Assessment Pulses: Dorsalis Pedis Palpable: [Left:Yes] Extremity colors, hair growth, and conditions: Extremity Color: [Left:Normal] Hair Growth on Extremity: [Left:Yes] Temperature of Extremity: [Left:Warm] Capillary Refill: [Left:< 3 seconds] Dependent Rubor: [Left:No No] Electronic Signature(s) Signed: 09/10/2023 4:52:08 PM By: Zenaida Deed RN, BSN Entered By: Zenaida Deed on 09/10/2023  10:49:28 -------------------------------------------------------------------------------- Multi Wound Chart Details Patient Name: Date of Service: Angela Husky, [name] 09/10/2023 1:15 PM Medical Record Number: [id number] Patient Account Number: [id number] Date of Birth/Sex: Treating RN: 03-Aug-1956 (67 y.o. F) Primary Care Delrico Minehart: Saralyn Pilar Other Clinician: Referring Adie Vilar: Treating Dia Jefferys/Extender: Harrie Jeans Weeks in Treatment: 10 Vital Signs Height(in): Pulse(bpm): Weight(lbs): Blood Pressure(mmHg): Body Mass Index(BMI): Temperature(F): 98.5 Respiratory Rate(breaths/min): [address] [place] Y ([id number]) [25:Photos:] [[id number] Page 3 of 8 N/A] Left, Lateral Lower Leg Left Calcaneus N/A Wound Location: Pressure Injury Pressure Injury N/A Wounding Event: Pressure Ulcer Pressure Ulcer N/A Primary  Etiology: Venous Leg Ulcer N/A N/A Secondary Etiology: Cataracts, Deep Vein Thrombosis, Cataracts, Deep Vein Thrombosis, N/A Comorbid History: Hypotension, Neuropathy, QuadriplegiaHypotension, Neuropathy, Quadriplegia 06/03/2021 06/03/2021 N/A Date Acquired: 10 10 N/A Weeks of Treatment: Open Open N/A Wound Status: No No N/A Wound Recurrence: 0.9x0.6x0.4 0.9x1.4x0.1 N/A Measurements L x W x D (cm) 0.424 0.99 N/A A (cm) : rea 0.17 0.099 N/A Volume (cm) : -79.70% -3093.50% N/A % Reduction in A rea: -261.70% -3200.00% N/A % Reduction in Volume: Category/Stage III Category/Stage III N/A Classification: Small Medium N/A Exudate A mount: Serosanguineous Serosanguineous N/A Exudate Type: red, brown red, brown N/A Exudate Color: Flat and Intact Flat and Intact N/A Wound Margin: Large (67-100%) None Present (0%) N/A Granulation A mount: Red N/A N/A Granulation Quality: None Present (0%) Large (67-100%) N/A Necrotic A mount: Fat Layer (Subcutaneous Tissue): Yes Fat Layer (Subcutaneous Tissue): Yes N/A Exposed  Structures: Fascia: No Fascia: No Tendon: No Tendon: No Muscle: No Muscle: No Joint: No Joint: No Bone: No Bone: No Small (1-33%) Small (1-33%) N/A Epithelialization: No Abnormalities Noted Scarring: Yes N/A Periwound Skin Texture: No Abnormalities Noted No Abnormalities Noted N/A Periwound Skin Moisture: No Abnormalities Noted No Abnormalities Noted N/A Periwound Skin Color: No Abnormality No Abnormality N/A Temperature: Treatment Notes Electronic Signature(s) Signed: 09/10/2023 2:01:45 PM By: Duanne Guess MD FACS Entered By: Duanne Guess on 09/10/2023 11:01:45 -------------------------------------------------------------------------------- Multi-Disciplinary Care Plan Details Patient Name: Date of Service: Angela Husky, [name] 09/10/2023 1:15 PM Medical Record Number: [id number] Patient Account Number: [id number] Date of Birth/Sex: Treating RN: Aug 03, 1956 (67 y.o. [name] Primary Care Abrahm Mancia: Saralyn Pilar Other Clinician: Referring Ariyel Jeangilles: Treating Earon Rivest/Extender: Park Liter in Treatment: 10 Multidisciplinary Care Plan reviewed with physician Active Inactive Pressure Nursing Diagnoses: Knowledge deficit related to management of pressures ulcers Potential for impaired tissue integrity related to pressure, friction, moisture, and shear [place] ([id number]) [id number] Page 4 of 8 Goals: Patient/caregiver will verbalize understanding of pressure ulcer management Date Initiated: 07/02/2023 Target Resolution Date: 10/08/2023 Goal Status: Active Interventions: Assess: immobility, friction, shearing, incontinence upon admission and as needed Assess offloading mechanisms upon admission and as needed Assess potential for pressure ulcer upon admission and as needed Provide education on pressure ulcers Notes: Wound/Skin Impairment Nursing Diagnoses: Impaired tissue integrity Knowledge  deficit related to ulceration/compromised skin integrity Goals: Patient/caregiver will verbalize understanding of skin care regimen Date Initiated: 07/02/2023 Target Resolution Date: 10/08/2023 Goal Status: Active Ulcer/skin breakdown will have a volume reduction of 30% by week 4 Date Initiated: 07/02/2023 Date Inactivated: 08/13/2023 Target Resolution Date: 07/30/2023 Goal Status: Unmet Unmet Reason: pressure relief Interventions: Assess patient/caregiver ability to obtain necessary supplies Assess patient/caregiver ability to perform ulcer/skin care regimen upon admission and as needed Assess ulceration(s) every visit Provide education on ulcer and skin care Treatment Activities: Skin care regimen initiated : 07/02/2023 Topical wound management initiated : 07/02/2023 Notes: Electronic Signature(s) Signed: 09/10/2023 4:52:08 PM By: Zenaida Deed RN, BSN Entered By: Zenaida Deed on 09/10/2023 10:52:34 -------------------------------------------------------------------------------- Pain Assessment Details Patient Name: Date of Service: Angela Husky, [name] 09/10/2023 1:15 PM Medical Record Number: [id number] Patient Account Number: [id number] Date of Birth/Sex: Treating RN: [birth year] (67 y.o. [name] Primary Care Zanylah Hardie: Saralyn Pilar Other Clinician: Referring Mele Sylvester: Treating Cyerra Yim/Extender: Harrie Jeans Weeks in Treatment: 10 Active Problems Location of Pain Severity and Description of Pain Patient Has Paino No Site Locations Rate the pain. [place], [place] Y ([id number]) [id number] Page 5 of 8 Rate the pain. Current Pain  Etiology: Venous Leg Ulcer N/A N/A Secondary Etiology: Cataracts, Deep Vein Thrombosis, Cataracts, Deep Vein Thrombosis, N/A Comorbid History: Hypotension, Neuropathy, QuadriplegiaHypotension, Neuropathy, Quadriplegia 06/03/2021 06/03/2021 N/A Date Acquired: 10 10 N/A Weeks of Treatment: Open Open N/A Wound Status: No No N/A Wound Recurrence: 0.9x0.6x0.4 0.9x1.4x0.1 N/A Measurements L x W x D (cm) 0.424 0.99 N/A A (cm) : rea 0.17 0.099 N/A Volume (cm) : -79.70% -3093.50% N/A % Reduction in A rea: -261.70% -3200.00% N/A % Reduction in Volume: Category/Stage III Category/Stage III N/A Classification: Small Medium N/A Exudate A mount: Serosanguineous Serosanguineous N/A Exudate Type: red, brown red, brown N/A Exudate Color: Flat and Intact Flat and Intact N/A Wound Margin: Large (67-100%) None Present (0%) N/A Granulation A mount: Red N/A N/A Granulation Quality: None Present (0%) Large (67-100%) N/A Necrotic A mount: Fat Layer (Subcutaneous Tissue): Yes Fat Layer (Subcutaneous Tissue): Yes N/A Exposed  Structures: Fascia: No Fascia: No Tendon: No Tendon: No Muscle: No Muscle: No Joint: No Joint: No Bone: No Bone: No Small (1-33%) Small (1-33%) N/A Epithelialization: No Abnormalities Noted Scarring: Yes N/A Periwound Skin Texture: No Abnormalities Noted No Abnormalities Noted N/A Periwound Skin Moisture: No Abnormalities Noted No Abnormalities Noted N/A Periwound Skin Color: No Abnormality No Abnormality N/A Temperature: Treatment Notes Electronic Signature(s) Signed: 09/10/2023 2:01:45 PM By: Duanne Guess MD FACS Entered By: Duanne Guess on 09/10/2023 11:01:45 -------------------------------------------------------------------------------- Multi-Disciplinary Care Plan Details Patient Name: Date of Service: Angela Husky, [name] 09/10/2023 1:15 PM Medical Record Number: [id number] Patient Account Number: [id number] Date of Birth/Sex: Treating RN: Aug 03, 1956 (67 y.o. [name] Primary Care Abrahm Mancia: Saralyn Pilar Other Clinician: Referring Ariyel Jeangilles: Treating Earon Rivest/Extender: Park Liter in Treatment: 10 Multidisciplinary Care Plan reviewed with physician Active Inactive Pressure Nursing Diagnoses: Knowledge deficit related to management of pressures ulcers Potential for impaired tissue integrity related to pressure, friction, moisture, and shear [place] ([id number]) [id number] Page 4 of 8 Goals: Patient/caregiver will verbalize understanding of pressure ulcer management Date Initiated: 07/02/2023 Target Resolution Date: 10/08/2023 Goal Status: Active Interventions: Assess: immobility, friction, shearing, incontinence upon admission and as needed Assess offloading mechanisms upon admission and as needed Assess potential for pressure ulcer upon admission and as needed Provide education on pressure ulcers Notes: Wound/Skin Impairment Nursing Diagnoses: Impaired tissue integrity Knowledge  deficit related to ulceration/compromised skin integrity Goals: Patient/caregiver will verbalize understanding of skin care regimen Date Initiated: 07/02/2023 Target Resolution Date: 10/08/2023 Goal Status: Active Ulcer/skin breakdown will have a volume reduction of 30% by week 4 Date Initiated: 07/02/2023 Date Inactivated: 08/13/2023 Target Resolution Date: 07/30/2023 Goal Status: Unmet Unmet Reason: pressure relief Interventions: Assess patient/caregiver ability to obtain necessary supplies Assess patient/caregiver ability to perform ulcer/skin care regimen upon admission and as needed Assess ulceration(s) every visit Provide education on ulcer and skin care Treatment Activities: Skin care regimen initiated : 07/02/2023 Topical wound management initiated : 07/02/2023 Notes: Electronic Signature(s) Signed: 09/10/2023 4:52:08 PM By: Zenaida Deed RN, BSN Entered By: Zenaida Deed on 09/10/2023 10:52:34 -------------------------------------------------------------------------------- Pain Assessment Details Patient Name: Date of Service: Angela Husky, [name] 09/10/2023 1:15 PM Medical Record Number: [id number] Patient Account Number: [id number] Date of Birth/Sex: Treating RN: [birth year] (67 y.o. [name] Primary Care Zanylah Hardie: Saralyn Pilar Other Clinician: Referring Mele Sylvester: Treating Cyerra Yim/Extender: Harrie Jeans Weeks in Treatment: 10 Active Problems Location of Pain Severity and Description of Pain Patient Has Paino No Site Locations Rate the pain. [place], [place] Y ([id number]) [id number].pdf Page 5 of 8 Rate the pain. Current Pain

## 2023-09-10 NOTE — Progress Notes (Addendum)
Marital Status - Divorced; Alcohol Use: Never; Drug Use: No History; Caffeine Use: Rarely; Financial Concerns: No; Food, Clothing or Shelter Needs: No; Support System Lacking: No; Transportation Concerns: Yes - relies on family for Advertising copywriter) Signed: 09/10/2023 2:21:16 PM By: Angela Guess MD FACS Entered By: [name] on 09/10/2023 11:03:22 -------------------------------------------------------------------------------- SuperBill Details Patient Name: Date of Service: Angela Husky, [name] 09/10/2023 Medical Record Number: [id number] Patient Account Number: [id number] Date of Birth/Sex: Treating RN: Jun 16, 1956 (67 y.o. 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 Modifier: verity Quantity: 1 Physician Procedures : CPT4 Code Description Modifier [id number] - WC PHYS LEVEL 3 - EST PT 25 ICD-10 Diagnosis Description L89.623 Pressure ulcer of left heel, stage 3 L97.828 Non-pressure chronic ulcer of other part of left lower leg with other specified severity  S14.109S Unspecified injury at unspecified level of  cervical spinal cord, sequela Quantity: 1 : [id number] 97597 - WC PHYS DEBR WO ANESTH 20 SQ CM [name] ([id number]) [phone] ICD-10 Diagnosis Description [phone] Pressure ulcer of left heel, stage 3 L97.828 Non-pressure chronic ulcer of other part of left lower leg  with other specified severity Quantity: 1 27.pdf Page 12 of 12 Electronic Signature(s) Signed: 09/10/2023 2:11:06 PM By: Angela Guess MD FACS Entered By: [name] on 09/10/2023 11:11:06  x 0.1cm depth; 0.099cm^3 volume. Post debridement Stage noted as Category/Stage III. Character of Wound/Ulcer Post Debridement is improved. Post procedure Diagnosis Wound #26: Same as Pre-Procedure Plan Follow-up Appointments: Return appointment in 1 month. - Dr. Lady Walker  with [name] 1 Bathing/ Shower/ Hygiene: May shower and wash wound with soap and water. Edema Control - Lymphedema / SCD / Other: Elevate legs to the level of the heart or above for 30 minutes daily and/or when sitting for 3-4 times a day throughout the day. Patient to wear own compression stockings every day. Moisturize legs daily. Off-Loading: Heel suspension boot to: - Wear the Prevalon boots to both feet at all times Low air-loss mattress (Group 2) WOUND #25: - Lower Leg Wound Laterality: Left, Lateral Prim Dressing: Promogran Prisma Matrix, 4.34 (sq in) (silver collagen) Every Other Day/30 Days ary Discharge Instructions: Moisten collagen with saline or hydrogel Secondary Dressing: Zetuvit Plus Silicone Border Dressing 5x5 (in/in) Every Other Day/30 Days Discharge Instructions: Apply silicone border over primary dressing as directed. WOUND #26: - Calcaneus Wound Laterality: Left Prim Dressing: Promogran Prisma Matrix, 4.34 (sq in) (silver collagen) Every Other Day/30 Days ary Discharge Instructions: Moisten collagen with saline or hydrogel Secondary Dressing: Zetuvit Plus Silicone Border Dressing 5x5 (in/in) Every Other Day/30 Days Discharge Instructions: Apply silicone border over primary dressing as directed. 09/10/2023: The wound is a little bit smaller today. The surface is a bit fibrotic with a little slough and dry skin around the edges. The ankle wound is deeper with slough accumulation. I used a curette to debride slough and dry skin from the heel and slough from the ankle. I think the heel, in particular, might benefit from some collagen so we will change the contact layer for both wounds to Prisma silver collagen. She will follow-up in 1 month, as is her preference. Electronic Signature(s) Signed: 09/10/2023 2:10:48 PM By: Angela Guess MD FACS Entered By: [name] on 09/10/2023 11:10:48 [place] Angela, [place] Y ([id number]) [id number] Page 10  of 12 -------------------------------------------------------------------------------- HxROS Details Patient Name: Date of Service: [name], [name] 09/10/2023 1:15 PM Medical Record Number: [id number] Patient Account Number: [id number] Date of Birth/Sex: Treating RN: [birth year] (67 y.o. 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 Suprapubic catheter Integumentary (Skin) Medical History: Negative for: History of Burn Musculoskeletal Medical History: Past Medical History Notes: Osteoporosis, hx decubitis ulcer of sacral region (stage 1); Neurologic Medical History: Positive for: Neuropathy - arm; Quadriplegia Negative for: Dementia; Paraplegia; Seizure Disorder Oncologic Medical History: Negative for: Received Chemotherapy; Received Radiation [name], [name] ([id number]) [id number] Page 11 of 12 Psychiatric Medical History: Negative for: Anorexia/bulimia; Confinement Anxiety HBO Extended History Items Eyes: Cataracts Immunizations Pneumococcal Vaccine: Received Pneumococcal Vaccination: Yes Received  Pneumococcal Vaccination On or After 60th Birthday: No Tetanus Vaccine: Last tetanus shot: 12/04/2010 Implantable Devices No devices added Family and Social History Unknown History: Yes; Never smoker;  She returns today after a nearly 68-month absence. The wounds look remarkably better. The heel is nearly closed with just a tiny superficial slit remaining. The ankle wound has also filled in considerably and has just some light slough on the surface. 08/13/2023: The heel wound got a little bit larger, but there is no longer any indication of pressure-induced tissue injury. There is thin slough on the surface. The ankle wound is nearly closed; in fact the only suggestion that it remains open was a small spot of drainage on the silver alginate that had been applied. 09/10/2023: The wound is a little bit smaller today. The surface is a bit fibrotic with a little slough and dry skin around the edges. The ankle wound is deeper with slough accumulation. The patient reports that she had an aide that was dropping her legs and she thinks the trauma from this may have caused the ankle wound to get bigger. Electronic Signature(s) Signed: 09/10/2023 2:03:15 PM By: Angela Guess MD FACS Entered By: [name] on 09/10/2023 11:03:15 -------------------------------------------------------------------------------- Physical Exam Details Patient Name: Date of Service: Angela Husky, [name] 09/10/2023 1:15 PM Medical Record Number: [id number] Patient Account Number: [id number] Date of Birth/Sex: Treating RN: 03-31-1956 (67 y.o. F) Primary Care Provider: Saralyn Walker Other Clinician: Referring Provider: Treating Provider/Extender: Harrie Jeans Weeks in Treatment: 10 Constitutional . . no acute distress. Respiratory Normal work of breathing on room air. Notes 09/10/2023: The wound is a little bit smaller today. The surface is a bit fibrotic with a little slough and dry skin around the edges. The ankle wound is deeper with slough accumulation. Electronic Signature(s) Signed: 09/10/2023 2:03:43 PM By: Angela Guess MD FACS Entered By: [name] on 09/10/2023  11:03:43 [place], [place] Y ([id number]) [id number] Page 5 of 12 -------------------------------------------------------------------------------- Physician Orders Details Patient Name: Date of Service: [name], [name] 09/10/2023 1:15 PM Medical Record Number: [id number] Patient Account Number: [id number] Date of Birth/Sex: Treating RN: [birth year] (67 y.o. [name] Primary Care Provider: Saralyn Walker Other Clinician: Referring Provider: Treating Provider/Extender: [name] in Treatment: 10 The following information was scribed by: Zenaida Deed The information was scribed for: [name] Verbal / Phone Orders: No Diagnosis Coding ICD-10 Coding Code Description [phone] Pressure ulcer of left heel, stage 3 L97.828 Non-pressure chronic ulcer of other part of left lower leg with other specified severity S14.109S Unspecified injury at unspecified level of cervical spinal cord, sequela Follow-up Appointments Return appointment in 1 month. - Dr. Lady Walker with [name] 1 Bathing/ Shower/ Hygiene May shower and wash wound with soap and water. Edema Control - Lymphedema / SCD / Other Elevate legs to the level of the heart or above for 30 minutes daily and/or when sitting for 3-4 times a day throughout the day. Patient to wear own compression stockings every day. Moisturize legs daily. Off-Loading Heel suspension boot to: - Wear the Prevalon boots to both feet at all times Low air-loss mattress (Group 2) Wound Treatment Wound #25 - Lower Leg Wound Laterality: Left, Lateral Prim Dressing: Promogran Prisma Matrix, 4.34 (sq in) (silver collagen) Every Other Day/30 Days ary Discharge Instructions: Moisten collagen with saline or hydrogel Secondary Dressing: Zetuvit Plus Silicone Border Dressing 5x5 (in/in) Every Other Day/30 Days Discharge Instructions: Apply silicone border over primary dressing as directed. Wound  #26 - Calcaneus Wound Laterality: Left Prim Dressing: Promogran Prisma Matrix, 4.34 (sq in) (silver collagen) Every Other Day/30 Days ary Discharge Instructions: Moisten collagen with saline or  hydrogel Secondary Dressing: Zetuvit Plus Silicone Border Dressing 5x5 (in/in) Every Other Day/30 Days Discharge Instructions: Apply silicone border over primary dressing as directed. Electronic Signature(s) Signed: 09/10/2023 2:21:16 PM By: Angela Guess MD FACS Entered By: [name] on 09/10/2023 11:09:47 Problem List Details -------------------------------------------------------------------------------- [name] ([id number]) [id number] Page 6 of 12 Patient Name: Date of Service: [name], [name] 09/10/2023 1:15 PM Medical Record Number: [id number] Patient Account Number: [id number] Date of Birth/Sex: Treating RN: [birth year] (67 y.o. [name] Primary Care Provider: Saralyn Walker Other Clinician: Referring Provider: Treating Provider/Extender: Harrie Jeans Weeks in Treatment: 10 Active Problems ICD-10 Encounter Code Description Active Date MDM Diagnosis [phone] Pressure ulcer of left heel, stage 3 07/02/2023 No Yes L97.828 Non-pressure chronic ulcer of other part of left lower leg with other specified 07/02/2023 No Yes severity S14.109S Unspecified injury at unspecified level of cervical spinal cord, sequela 07/02/2023 No Yes Inactive Problems Resolved Problems Electronic Signature(s) Signed: 09/10/2023 2:01:22 PM By: Angela Guess MD FACS Entered By: [name] on 09/10/2023 11:01:22 -------------------------------------------------------------------------------- Progress Note Details Patient Name: Date of Service: Angela Husky, [name] 09/10/2023 1:15 PM Medical Record Number: [id number] Patient Account Number: [id number] Date of Birth/Sex: Treating RN: July 06, 1956 (67 y.o. F) Primary Care Provider:  Saralyn Walker Other Clinician: Referring Provider: Treating Provider/Extender: [name] in Treatment: 10 Subjective Chief Complaint Information obtained from Patient Patient is at the clinic for treatment of an open pressure ulcers 01/12/2022; patient returns to clinic with an area on her left heel and left posterior calf History of Present Illness (HPI) Long standing history of pressure ulcers of ischium and posterior calf. Has LAL mattress. Has home health and aide. Prealbumin 18 last check, states taking carnation breakfast once daily. 11/01/15 Last seen 4 weeks ago. Current collagen to wounds. Was discharged from prior home health due to non compliance. Has healed calf ulcer.Left heel from wearing Ugg boots. 12/20/15; the only area that remains according to the patient is on the posterior aspect of her left ankle over the Achilles area although this looks very healthy. Apparently she has no open wounds on her buttock's or her right leg 01/17/16; the area of the we were currently treating his on the posterior aspect of her left ankle. This is just about closed. I did a light surface debridement here. She shows as in the wound today on her posterior thigh just above her antecubital fossa on the right. The wound itself looks clean. It looks like it is probably pressure with her wheelchair and she although the patient thinks that this is a Nurse, adult strap injury. [employer] has been dressing this with calcium alginate for the last 2 weeks. Using collagen on the left lateral leg 02/28/16; the patient follows episodically/monthly here for pressure ulcers. Currently she has an area on her posterior thigh which is clearly trauma on her wheelchair cushion. She has an area on the left lateral leg and new areas on the right great toe at the tip which are probably pressure areas from a new wheelchair according to the patient. As no evidence of infection in either one of  these areas 04/10/16; the patient a follows here episodically for pressure ulcers. She has advanced Homecare but apparently most of the dressings are being done by her care attendance at home. The area on her left great toe is healed. The area on her left lateral malleolus has not in fact this is a deep wound and I'm  [place], [place] Y ([id number]) [id number] Page 1 of 12 Visit Report for 09/10/2023 Chief Complaint Document Details Patient Name: Date of Service: [name], [name] 09/10/2023 1:15 PM Medical Record Number: [id number] Patient Account Number: [id number] Date of Birth/Sex: Treating RN: [birth year] (67 y.o. F) Primary Care Provider: Saralyn Walker Other Clinician: Referring Provider: Treating Provider/Extender: [name] in Treatment: 10 Information Obtained from: Patient Chief Complaint Patient is at the clinic for treatment of an open pressure ulcers 01/12/2022; patient returns to clinic with an area on her left heel and left posterior calf Electronic Signature(s) Signed: 09/10/2023 2:01:58 PM By: Angela Guess MD FACS Entered By: [name] on 09/10/2023 11:01:58 -------------------------------------------------------------------------------- Debridement Details Patient Name: Date of Service: Angela Husky, [name] 09/10/2023 1:15 PM Medical Record Number: [id number] Patient Account Number: [id number] Date of Birth/Sex: Treating RN: Oct 31, 1956 (67 y.o. [name] Primary Care Provider: Saralyn Walker Other Clinician: Referring Provider: Treating Provider/Extender: Harrie Jeans Weeks in Treatment: 10 Debridement Performed for Assessment: Wound #25 Left,Lateral Lower Leg Performed By: Physician Angela Guess, MD The following information was scribed by: Zenaida Deed The information was scribed for: [name] Debridement Type: Debridement Severity of Tissue Pre Debridement: Fat layer exposed Level of Consciousness (Pre-procedure): Awake and Alert Pre-procedure Verification/Time Out Yes - 13:55 Taken: Start Time: 13:57 Pain Control: Lidocaine 4% T opical Solution Percent of Wound Bed Debrided: 100% T Area Debrided (cm): otal 0.42 Tissue and other material debrided: Non-Viable,  Slough, Slough Level: Non-Viable Tissue Debridement Description: Selective/Open Wound Instrument: Curette Bleeding: Minimum Hemostasis Achieved: Pressure Procedural Pain: Insensate Post Procedural Pain: Insensate Response to Treatment: Procedure was tolerated well Level of Consciousness (Post- Awake and Alert procedure): Post Debridement Measurements of Total Wound Length: (cm) 0.9 Stage: Category/Stage III Width: (cm) 0.6 Depth: (cm) 0.4 [place], [place] ([id number]) [phone].pdf Page 2 of 12 Volume: (cm) 0.17 Character of Wound/Ulcer Post Debridement: Improved Severity of Tissue Post Debridement: Fat layer exposed Post Procedure Diagnosis Same as Pre-procedure Electronic Signature(s) Signed: 09/10/2023 2:21:16 PM By: Angela Guess MD FACS Signed: 09/10/2023 4:52:08 PM By: Zenaida Deed RN, BSN Entered By: Zenaida Deed on 09/10/2023 11:02:06 -------------------------------------------------------------------------------- Debridement Details Patient Name: Date of Service: Angela Husky, [name] 09/10/2023 1:15 PM Medical Record Number: [id number] Patient Account Number: [id number] Date of Birth/Sex: Treating RN: [birth year] (67 y.o. [name] Primary Care Provider: Saralyn Walker Other Clinician: Referring Provider: Treating Provider/Extender: Harrie Jeans Weeks in Treatment: 10 Debridement Performed for Assessment: Wound #26 Left Calcaneus Performed By: Physician Angela Guess, MD The following information was scribed by: Zenaida Deed The information was scribed for: [name] Debridement Type: Debridement Level of Consciousness (Pre-procedure): Awake and Alert Pre-procedure Verification/Time Out Yes - 13:55 Taken: Start Time: 13:57 Pain Control: Lidocaine 4% T opical Solution Percent of Wound Bed Debrided: 100% T Area Debrided (cm): otal 0.99 Tissue and other material debrided: Non-Viable, Slough,  Fibrin/Exudate, Slough Level: Non-Viable Tissue Debridement Description: Selective/Open Wound Instrument: Curette Bleeding: Minimum Hemostasis Achieved: Pressure Procedural Pain: Insensate Post Procedural Pain: Insensate Response to Treatment: Procedure was tolerated well Level of Consciousness (Post- Awake and Alert procedure): Post Debridement Measurements of Total Wound Length: (cm) 0.9 Stage: Category/Stage III Width: (cm) 1.4 Depth: (cm) 0.1 Volume: (cm) 0.099 Character of Wound/Ulcer Post Debridement: Improved Post Procedure Diagnosis Same as Pre-procedure Electronic Signature(s) Signed: 09/10/2023 2:21:16 PM By: Angela Guess MD FACS Signed: 09/10/2023 4:52:08 PM By: Zenaida Deed RN, BSN Entered By: Zenaida Deed on 09/10/2023 11:02:43 [place],  She returns today after a nearly 68-month absence. The wounds look remarkably better. The heel is nearly closed with just a tiny superficial slit remaining. The ankle wound has also filled in considerably and has just some light slough on the surface. 08/13/2023: The heel wound got a little bit larger, but there is no longer any indication of pressure-induced tissue injury. There is thin slough on the surface. The ankle wound is nearly closed; in fact the only suggestion that it remains open was a small spot of drainage on the silver alginate that had been applied. 09/10/2023: The wound is a little bit smaller today. The surface is a bit fibrotic with a little slough and dry skin around the edges. The ankle wound is deeper with slough accumulation. The patient reports that she had an aide that was dropping her legs and she thinks the trauma from this may have caused the ankle wound to get bigger. Electronic Signature(s) Signed: 09/10/2023 2:03:15 PM By: Angela Guess MD FACS Entered By: [name] on 09/10/2023 11:03:15 -------------------------------------------------------------------------------- Physical Exam Details Patient Name: Date of Service: Angela Husky, [name] 09/10/2023 1:15 PM Medical Record Number: [id number] Patient Account Number: [id number] Date of Birth/Sex: Treating RN: 03-31-1956 (67 y.o. F) Primary Care Provider: Saralyn Walker Other Clinician: Referring Provider: Treating Provider/Extender: Harrie Jeans Weeks in Treatment: 10 Constitutional . . no acute distress. Respiratory Normal work of breathing on room air. Notes 09/10/2023: The wound is a little bit smaller today. The surface is a bit fibrotic with a little slough and dry skin around the edges. The ankle wound is deeper with slough accumulation. Electronic Signature(s) Signed: 09/10/2023 2:03:43 PM By: Angela Guess MD FACS Entered By: [name] on 09/10/2023  11:03:43 [place], [place] Y ([id number]) [id number] Page 5 of 12 -------------------------------------------------------------------------------- Physician Orders Details Patient Name: Date of Service: [name], [name] 09/10/2023 1:15 PM Medical Record Number: [id number] Patient Account Number: [id number] Date of Birth/Sex: Treating RN: [birth year] (67 y.o. [name] Primary Care Provider: Saralyn Walker Other Clinician: Referring Provider: Treating Provider/Extender: [name] in Treatment: 10 The following information was scribed by: Zenaida Deed The information was scribed for: [name] Verbal / Phone Orders: No Diagnosis Coding ICD-10 Coding Code Description [phone] Pressure ulcer of left heel, stage 3 L97.828 Non-pressure chronic ulcer of other part of left lower leg with other specified severity S14.109S Unspecified injury at unspecified level of cervical spinal cord, sequela Follow-up Appointments Return appointment in 1 month. - Dr. Lady Walker with [name] 1 Bathing/ Shower/ Hygiene May shower and wash wound with soap and water. Edema Control - Lymphedema / SCD / Other Elevate legs to the level of the heart or above for 30 minutes daily and/or when sitting for 3-4 times a day throughout the day. Patient to wear own compression stockings every day. Moisturize legs daily. Off-Loading Heel suspension boot to: - Wear the Prevalon boots to both feet at all times Low air-loss mattress (Group 2) Wound Treatment Wound #25 - Lower Leg Wound Laterality: Left, Lateral Prim Dressing: Promogran Prisma Matrix, 4.34 (sq in) (silver collagen) Every Other Day/30 Days ary Discharge Instructions: Moisten collagen with saline or hydrogel Secondary Dressing: Zetuvit Plus Silicone Border Dressing 5x5 (in/in) Every Other Day/30 Days Discharge Instructions: Apply silicone border over primary dressing as directed. Wound  #26 - Calcaneus Wound Laterality: Left Prim Dressing: Promogran Prisma Matrix, 4.34 (sq in) (silver collagen) Every Other Day/30 Days ary Discharge Instructions: Moisten collagen with saline or  Marital Status - Divorced; Alcohol Use: Never; Drug Use: No History; Caffeine Use: Rarely; Financial Concerns: No; Food, Clothing or Shelter Needs: No; Support System Lacking: No; Transportation Concerns: Yes - relies on family for Advertising copywriter) Signed: 09/10/2023 2:21:16 PM By: Angela Guess MD FACS Entered By: [name] on 09/10/2023 11:03:22 -------------------------------------------------------------------------------- SuperBill Details Patient Name: Date of Service: Angela Husky, [name] 09/10/2023 Medical Record Number: [id number] Patient Account Number: [id number] Date of Birth/Sex: Treating RN: Jun 16, 1956 (67 y.o. 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 Modifier: verity Quantity: 1 Physician Procedures : CPT4 Code Description Modifier [id number] - WC PHYS LEVEL 3 - EST PT 25 ICD-10 Diagnosis Description L89.623 Pressure ulcer of left heel, stage 3 L97.828 Non-pressure chronic ulcer of other part of left lower leg with other specified severity  S14.109S Unspecified injury at unspecified level of  cervical spinal cord, sequela Quantity: 1 : [id number] - WC PHYS DEBR WO ANESTH 20 SQ CM [name] ([id number]) [phone] ICD-10 Diagnosis Description [phone] Pressure ulcer of left heel, stage 3 L97.828 Non-pressure chronic ulcer of other part of left lower leg  with other specified severity Quantity: 1 27.pdf Page 12 of 12 Electronic Signature(s) Signed: 09/10/2023 2:11:06 PM By: Angela Guess MD FACS Entered By: [name] on 09/10/2023 11:11:06  She returns today after a nearly 68-month absence. The wounds look remarkably better. The heel is nearly closed with just a tiny superficial slit remaining. The ankle wound has also filled in considerably and has just some light slough on the surface. 08/13/2023: The heel wound got a little bit larger, but there is no longer any indication of pressure-induced tissue injury. There is thin slough on the surface. The ankle wound is nearly closed; in fact the only suggestion that it remains open was a small spot of drainage on the silver alginate that had been applied. 09/10/2023: The wound is a little bit smaller today. The surface is a bit fibrotic with a little slough and dry skin around the edges. The ankle wound is deeper with slough accumulation. The patient reports that she had an aide that was dropping her legs and she thinks the trauma from this may have caused the ankle wound to get bigger. Electronic Signature(s) Signed: 09/10/2023 2:03:15 PM By: Angela Guess MD FACS Entered By: [name] on 09/10/2023 11:03:15 -------------------------------------------------------------------------------- Physical Exam Details Patient Name: Date of Service: Angela Husky, [name] 09/10/2023 1:15 PM Medical Record Number: [id number] Patient Account Number: [id number] Date of Birth/Sex: Treating RN: 03-31-1956 (67 y.o. F) Primary Care Provider: Saralyn Walker Other Clinician: Referring Provider: Treating Provider/Extender: Harrie Jeans Weeks in Treatment: 10 Constitutional . . no acute distress. Respiratory Normal work of breathing on room air. Notes 09/10/2023: The wound is a little bit smaller today. The surface is a bit fibrotic with a little slough and dry skin around the edges. The ankle wound is deeper with slough accumulation. Electronic Signature(s) Signed: 09/10/2023 2:03:43 PM By: Angela Guess MD FACS Entered By: [name] on 09/10/2023  11:03:43 [place], [place] Y ([id number]) [id number] Page 5 of 12 -------------------------------------------------------------------------------- Physician Orders Details Patient Name: Date of Service: [name], [name] 09/10/2023 1:15 PM Medical Record Number: [id number] Patient Account Number: [id number] Date of Birth/Sex: Treating RN: [birth year] (67 y.o. [name] Primary Care Provider: Saralyn Walker Other Clinician: Referring Provider: Treating Provider/Extender: [name] in Treatment: 10 The following information was scribed by: Zenaida Deed The information was scribed for: [name] Verbal / Phone Orders: No Diagnosis Coding ICD-10 Coding Code Description [phone] Pressure ulcer of left heel, stage 3 L97.828 Non-pressure chronic ulcer of other part of left lower leg with other specified severity S14.109S Unspecified injury at unspecified level of cervical spinal cord, sequela Follow-up Appointments Return appointment in 1 month. - Dr. Lady Walker with [name] 1 Bathing/ Shower/ Hygiene May shower and wash wound with soap and water. Edema Control - Lymphedema / SCD / Other Elevate legs to the level of the heart or above for 30 minutes daily and/or when sitting for 3-4 times a day throughout the day. Patient to wear own compression stockings every day. Moisturize legs daily. Off-Loading Heel suspension boot to: - Wear the Prevalon boots to both feet at all times Low air-loss mattress (Group 2) Wound Treatment Wound #25 - Lower Leg Wound Laterality: Left, Lateral Prim Dressing: Promogran Prisma Matrix, 4.34 (sq in) (silver collagen) Every Other Day/30 Days ary Discharge Instructions: Moisten collagen with saline or hydrogel Secondary Dressing: Zetuvit Plus Silicone Border Dressing 5x5 (in/in) Every Other Day/30 Days Discharge Instructions: Apply silicone border over primary dressing as directed. Wound  #26 - Calcaneus Wound Laterality: Left Prim Dressing: Promogran Prisma Matrix, 4.34 (sq in) (silver collagen) Every Other Day/30 Days ary Discharge Instructions: Moisten collagen with saline or  x 0.1cm depth; 0.099cm^3 volume. Post debridement Stage noted as Category/Stage III. Character of Wound/Ulcer Post Debridement is improved. Post procedure Diagnosis Wound #26: Same as Pre-Procedure Plan Follow-up Appointments: Return appointment in 1 month. - Dr. Lady Walker  with [name] 1 Bathing/ Shower/ Hygiene: May shower and wash wound with soap and water. Edema Control - Lymphedema / SCD / Other: Elevate legs to the level of the heart or above for 30 minutes daily and/or when sitting for 3-4 times a day throughout the day. Patient to wear own compression stockings every day. Moisturize legs daily. Off-Loading: Heel suspension boot to: - Wear the Prevalon boots to both feet at all times Low air-loss mattress (Group 2) WOUND #25: - Lower Leg Wound Laterality: Left, Lateral Prim Dressing: Promogran Prisma Matrix, 4.34 (sq in) (silver collagen) Every Other Day/30 Days ary Discharge Instructions: Moisten collagen with saline or hydrogel Secondary Dressing: Zetuvit Plus Silicone Border Dressing 5x5 (in/in) Every Other Day/30 Days Discharge Instructions: Apply silicone border over primary dressing as directed. WOUND #26: - Calcaneus Wound Laterality: Left Prim Dressing: Promogran Prisma Matrix, 4.34 (sq in) (silver collagen) Every Other Day/30 Days ary Discharge Instructions: Moisten collagen with saline or hydrogel Secondary Dressing: Zetuvit Plus Silicone Border Dressing 5x5 (in/in) Every Other Day/30 Days Discharge Instructions: Apply silicone border over primary dressing as directed. 09/10/2023: The wound is a little bit smaller today. The surface is a bit fibrotic with a little slough and dry skin around the edges. The ankle wound is deeper with slough accumulation. I used a curette to debride slough and dry skin from the heel and slough from the ankle. I think the heel, in particular, might benefit from some collagen so we will change the contact layer for both wounds to Prisma silver collagen. She will follow-up in 1 month, as is her preference. Electronic Signature(s) Signed: 09/10/2023 2:10:48 PM By: Angela Guess MD FACS Entered By: [name] on 09/10/2023 11:10:48 [place], [place] Y ([id number]) [id number] Page 10  of 12 -------------------------------------------------------------------------------- HxROS Details Patient Name: Date of Service: [name], [name] 09/10/2023 1:15 PM Medical Record Number: [id number] Patient Account Number: [id number] Date of Birth/Sex: Treating RN: [birth year] (67 y.o. 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 Suprapubic catheter Integumentary (Skin) Medical History: Negative for: History of Burn Musculoskeletal Medical History: Past Medical History Notes: Osteoporosis, hx decubitis ulcer of sacral region (stage 1); Neurologic Medical History: Positive for: Neuropathy - arm; Quadriplegia Negative for: Dementia; Paraplegia; Seizure Disorder Oncologic Medical History: Negative for: Received Chemotherapy; Received Radiation [name], [name] ([id number]) [id number] Page 11 of 12 Psychiatric Medical History: Negative for: Anorexia/bulimia; Confinement Anxiety HBO Extended History Items Eyes: Cataracts Immunizations Pneumococcal Vaccine: Received Pneumococcal Vaccination: Yes Received  Pneumococcal Vaccination On or After 60th Birthday: No Tetanus Vaccine: Last tetanus shot: 12/04/2010 Implantable Devices No devices added Family and Social History Unknown History: Yes; Never smoker;  hydrogel Secondary Dressing: Zetuvit Plus Silicone Border Dressing 5x5 (in/in) Every Other Day/30 Days Discharge Instructions: Apply silicone border over primary dressing as directed. Electronic Signature(s) Signed: 09/10/2023 2:21:16 PM By: Angela Guess MD FACS Entered By: [name] on 09/10/2023 11:09:47 Problem List Details -------------------------------------------------------------------------------- [name] ([id number]) [id number] Page 6 of 12 Patient Name: Date of Service: [name], [name] 09/10/2023 1:15 PM Medical Record Number: [id number] Patient Account Number: [id number] Date of Birth/Sex: Treating RN: [birth year] (67 y.o. [name] Primary Care Provider: Saralyn Walker Other Clinician: Referring Provider: Treating Provider/Extender: Harrie Jeans Weeks in Treatment: 10 Active Problems ICD-10 Encounter Code Description Active Date MDM Diagnosis [phone] Pressure ulcer of left heel, stage 3 07/02/2023 No Yes L97.828 Non-pressure chronic ulcer of other part of left lower leg with other specified 07/02/2023 No Yes severity S14.109S Unspecified injury at unspecified level of cervical spinal cord, sequela 07/02/2023 No Yes Inactive Problems Resolved Problems Electronic Signature(s) Signed: 09/10/2023 2:01:22 PM By: Angela Guess MD FACS Entered By: [name] on 09/10/2023 11:01:22 -------------------------------------------------------------------------------- Progress Note Details Patient Name: Date of Service: Angela Husky, [name] 09/10/2023 1:15 PM Medical Record Number: [id number] Patient Account Number: [id number] Date of Birth/Sex: Treating RN: July 06, 1956 (67 y.o. F) Primary Care Provider:  Saralyn Walker Other Clinician: Referring Provider: Treating Provider/Extender: [name] in Treatment: 10 Subjective Chief Complaint Information obtained from Patient Patient is at the clinic for treatment of an open pressure ulcers 01/12/2022; patient returns to clinic with an area on her left heel and left posterior calf History of Present Illness (HPI) Long standing history of pressure ulcers of ischium and posterior calf. Has LAL mattress. Has home health and aide. Prealbumin 18 last check, states taking carnation breakfast once daily. 11/01/15 Last seen 4 weeks ago. Current collagen to wounds. Was discharged from prior home health due to non compliance. Has healed calf ulcer.Left heel from wearing Ugg boots. 12/20/15; the only area that remains according to the patient is on the posterior aspect of her left ankle over the Achilles area although this looks very healthy. Apparently she has no open wounds on her buttock's or her right leg 01/17/16; the area of the we were currently treating his on the posterior aspect of her left ankle. This is just about closed. I did a light surface debridement here. She shows as in the wound today on her posterior thigh just above her antecubital fossa on the right. The wound itself looks clean. It looks like it is probably pressure with her wheelchair and she although the patient thinks that this is a Nurse, adult strap injury. [employer] has been dressing this with calcium alginate for the last 2 weeks. Using collagen on the left lateral leg 02/28/16; the patient follows episodically/monthly here for pressure ulcers. Currently she has an area on her posterior thigh which is clearly trauma on her wheelchair cushion. She has an area on the left lateral leg and new areas on the right great toe at the tip which are probably pressure areas from a new wheelchair according to the patient. As no evidence of infection in either one of  these areas 04/10/16; the patient a follows here episodically for pressure ulcers. She has advanced Homecare but apparently most of the dressings are being done by her care attendance at home. The area on her left great toe is healed. The area on her left lateral malleolus has not in fact this is a deep wound and I'm  Marital Status - Divorced; Alcohol Use: Never; Drug Use: No History; Caffeine Use: Rarely; Financial Concerns: No; Food, Clothing or Shelter Needs: No; Support System Lacking: No; Transportation Concerns: Yes - relies on family for Advertising copywriter) Signed: 09/10/2023 2:21:16 PM By: Angela Guess MD FACS Entered By: [name] on 09/10/2023 11:03:22 -------------------------------------------------------------------------------- SuperBill Details Patient Name: Date of Service: Angela Husky, [name] 09/10/2023 Medical Record Number: [id number] Patient Account Number: [id number] Date of Birth/Sex: Treating RN: Jun 16, 1956 (67 y.o. 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 Modifier: verity Quantity: 1 Physician Procedures : CPT4 Code Description Modifier [id number] 99213 - WC PHYS LEVEL 3 - EST PT 25 ICD-10 Diagnosis Description L89.623 Pressure ulcer of left heel, stage 3 L97.828 Non-pressure chronic ulcer of other part of left lower leg with other specified severity  S14.109S Unspecified injury at unspecified level of  cervical spinal cord, sequela Quantity: 1 : [id number] 97597 - WC PHYS DEBR WO ANESTH 20 SQ CM [name] ([id number]) [phone] ICD-10 Diagnosis Description [phone] Pressure ulcer of left heel, stage 3 L97.828 Non-pressure chronic ulcer of other part of left lower leg  with other specified severity Quantity: 1 27.pdf Page 12 of 12 Electronic Signature(s) Signed: 09/10/2023 2:11:06 PM By: Angela Guess MD FACS Entered By: [name] on 09/10/2023 11:11:06  [place], [place] Y ([id number]) [id number] Page 1 of 12 Visit Report for 09/10/2023 Chief Complaint Document Details Patient Name: Date of Service: [name], [name] 09/10/2023 1:15 PM Medical Record Number: [id number] Patient Account Number: [id number] Date of Birth/Sex: Treating RN: [birth year] (67 y.o. F) Primary Care Provider: Saralyn Walker Other Clinician: Referring Provider: Treating Provider/Extender: [name] in Treatment: 10 Information Obtained from: Patient Chief Complaint Patient is at the clinic for treatment of an open pressure ulcers 01/12/2022; patient returns to clinic with an area on her left heel and left posterior calf Electronic Signature(s) Signed: 09/10/2023 2:01:58 PM By: Angela Guess MD FACS Entered By: [name] on 09/10/2023 11:01:58 -------------------------------------------------------------------------------- Debridement Details Patient Name: Date of Service: Angela Husky, [name] 09/10/2023 1:15 PM Medical Record Number: [id number] Patient Account Number: [id number] Date of Birth/Sex: Treating RN: Oct 31, 1956 (67 y.o. [name] Primary Care Provider: Saralyn Walker Other Clinician: Referring Provider: Treating Provider/Extender: Harrie Jeans Weeks in Treatment: 10 Debridement Performed for Assessment: Wound #25 Left,Lateral Lower Leg Performed By: Physician Angela Guess, MD The following information was scribed by: Zenaida Deed The information was scribed for: [name] Debridement Type: Debridement Severity of Tissue Pre Debridement: Fat layer exposed Level of Consciousness (Pre-procedure): Awake and Alert Pre-procedure Verification/Time Out Yes - 13:55 Taken: Start Time: 13:57 Pain Control: Lidocaine 4% T opical Solution Percent of Wound Bed Debrided: 100% T Area Debrided (cm): otal 0.42 Tissue and other material debrided: Non-Viable,  Slough, Slough Level: Non-Viable Tissue Debridement Description: Selective/Open Wound Instrument: Curette Bleeding: Minimum Hemostasis Achieved: Pressure Procedural Pain: Insensate Post Procedural Pain: Insensate Response to Treatment: Procedure was tolerated well Level of Consciousness (Post- Awake and Alert procedure): Post Debridement Measurements of Total Wound Length: (cm) 0.9 Stage: Category/Stage III Width: (cm) 0.6 Depth: (cm) 0.4 [place], [place] ([id number]) [phone].pdf Page 2 of 12 Volume: (cm) 0.17 Character of Wound/Ulcer Post Debridement: Improved Severity of Tissue Post Debridement: Fat layer exposed Post Procedure Diagnosis Same as Pre-procedure Electronic Signature(s) Signed: 09/10/2023 2:21:16 PM By: Angela Guess MD FACS Signed: 09/10/2023 4:52:08 PM By: Zenaida Deed RN, BSN Entered By: Zenaida Deed on 09/10/2023 11:02:06 -------------------------------------------------------------------------------- Debridement Details Patient Name: Date of Service: Angela Husky, [name] 09/10/2023 1:15 PM Medical Record Number: [id number] Patient Account Number: [id number] Date of Birth/Sex: Treating RN: [birth year] (67 y.o. [name] Primary Care Provider: Saralyn Walker Other Clinician: Referring Provider: Treating Provider/Extender: Harrie Jeans Weeks in Treatment: 10 Debridement Performed for Assessment: Wound #26 Left Calcaneus Performed By: Physician Angela Guess, MD The following information was scribed by: Zenaida Deed The information was scribed for: [name] Debridement Type: Debridement Level of Consciousness (Pre-procedure): Awake and Alert Pre-procedure Verification/Time Out Yes - 13:55 Taken: Start Time: 13:57 Pain Control: Lidocaine 4% T opical Solution Percent of Wound Bed Debrided: 100% T Area Debrided (cm): otal 0.99 Tissue and other material debrided: Non-Viable, Slough,  Fibrin/Exudate, Slough Level: Non-Viable Tissue Debridement Description: Selective/Open Wound Instrument: Curette Bleeding: Minimum Hemostasis Achieved: Pressure Procedural Pain: Insensate Post Procedural Pain: Insensate Response to Treatment: Procedure was tolerated well Level of Consciousness (Post- Awake and Alert procedure): Post Debridement Measurements of Total Wound Length: (cm) 0.9 Stage: Category/Stage III Width: (cm) 1.4 Depth: (cm) 0.1 Volume: (cm) 0.099 Character of Wound/Ulcer Post Debridement: Improved Post Procedure Diagnosis Same as Pre-procedure Electronic Signature(s) Signed: 09/10/2023 2:21:16 PM By: Angela Guess MD FACS Signed: 09/10/2023 4:52:08 PM By: Zenaida Deed RN, BSN Entered By: Zenaida Deed on 09/10/2023 11:02:43 [place],

## 2023-09-11 DIAGNOSIS — L89623 Pressure ulcer of left heel, stage 3: Secondary | ICD-10-CM | POA: Diagnosis not present

## 2023-09-15 ENCOUNTER — Other Ambulatory Visit (HOSPITAL_COMMUNITY): Payer: Self-pay

## 2023-09-15 ENCOUNTER — Other Ambulatory Visit: Payer: Self-pay | Admitting: Family Medicine

## 2023-09-15 DIAGNOSIS — N319 Neuromuscular dysfunction of bladder, unspecified: Secondary | ICD-10-CM

## 2023-09-17 ENCOUNTER — Telehealth: Payer: Self-pay | Admitting: *Deleted

## 2023-09-17 ENCOUNTER — Other Ambulatory Visit: Payer: Self-pay

## 2023-09-17 ENCOUNTER — Other Ambulatory Visit (HOSPITAL_COMMUNITY): Payer: Self-pay

## 2023-09-17 DIAGNOSIS — R339 Retention of urine, unspecified: Secondary | ICD-10-CM | POA: Diagnosis not present

## 2023-09-17 MED ORDER — OXYBUTYNIN CHLORIDE 5 MG PO TABS
5.0000 mg | ORAL_TABLET | Freq: Two times a day (BID) | ORAL | 1 refills | Status: DC
  Filled 2023-09-17: qty 180, 90d supply, fill #0
  Filled 2023-12-06: qty 180, 90d supply, fill #1

## 2023-09-17 NOTE — Telephone Encounter (Signed)
I am glad that she held her Coumadin doses on Monday and Tuesday.  I would recommend taking the smaller dose of 2 mg every day until next Sunday, then recheck INR.  If it is back within the target range, resume her usual medication routine.  If not, reach back out to [place].

## 2023-09-17 NOTE — Telephone Encounter (Signed)
Pt informed of below.

## 2023-09-17 NOTE — Telephone Encounter (Signed)
Pt calling to say that her INR is 3.7 and she said it is probably due to her taking diflucan on Monday.  She said she held coumadin Monday and Tuesday and is supposed to take 4mg  today but due to being high something needs to be done. Please advise.

## 2023-09-18 DIAGNOSIS — R339 Retention of urine, unspecified: Secondary | ICD-10-CM | POA: Diagnosis not present

## 2023-09-19 ENCOUNTER — Other Ambulatory Visit: Payer: Self-pay

## 2023-09-19 ENCOUNTER — Other Ambulatory Visit: Payer: Self-pay | Admitting: Family Medicine

## 2023-09-19 ENCOUNTER — Other Ambulatory Visit (HOSPITAL_COMMUNITY): Payer: Self-pay

## 2023-09-19 MED ORDER — BUMETANIDE 1 MG PO TABS
1.0000 mg | ORAL_TABLET | Freq: Every day | ORAL | 1 refills | Status: DC
  Filled 2023-09-19: qty 100, 100d supply, fill #0
  Filled 2024-01-02: qty 100, 100d supply, fill #1

## 2023-09-25 ENCOUNTER — Telehealth: Payer: Self-pay | Admitting: *Deleted

## 2023-09-25 NOTE — Telephone Encounter (Signed)
INR elevated 3.7 on 09/17/2023, above goal of 2.0-3.0. Recommended reducing daily dose to 2 mg for 1 week then recheck. Recheck on 09/25/2023, INR improved to 3.1. continue reduced dose of 2 mg for an additional week and recheck INR. If at goal, continue 2 mg daily dose or resume normal dosing of 2 mg Tuesday, Wednesday, Thursday, Saturday, and Sunday and 4 mg Monday and Friday with INR checks weekly.

## 2023-09-25 NOTE — Telephone Encounter (Signed)
Pt called yesterday to report her INR at 3.1, spoke with provider and she said she should continue 2 mg for a week and then retest.

## 2023-10-01 ENCOUNTER — Other Ambulatory Visit (HOSPITAL_COMMUNITY): Payer: Self-pay

## 2023-10-01 ENCOUNTER — Encounter: Payer: Self-pay | Admitting: Family Medicine

## 2023-10-01 ENCOUNTER — Ambulatory Visit (INDEPENDENT_AMBULATORY_CARE_PROVIDER_SITE_OTHER): Payer: HMO | Admitting: Family Medicine

## 2023-10-01 VITALS — BP 104/66 | HR 69 | Resp 20

## 2023-10-01 DIAGNOSIS — R739 Hyperglycemia, unspecified: Secondary | ICD-10-CM | POA: Diagnosis not present

## 2023-10-01 DIAGNOSIS — E038 Other specified hypothyroidism: Secondary | ICD-10-CM

## 2023-10-01 DIAGNOSIS — I959 Hypotension, unspecified: Secondary | ICD-10-CM | POA: Diagnosis not present

## 2023-10-01 DIAGNOSIS — R5383 Other fatigue: Secondary | ICD-10-CM | POA: Diagnosis not present

## 2023-10-01 DIAGNOSIS — Z23 Encounter for immunization: Secondary | ICD-10-CM | POA: Diagnosis not present

## 2023-10-01 DIAGNOSIS — Z780 Asymptomatic menopausal state: Secondary | ICD-10-CM

## 2023-10-01 DIAGNOSIS — Z1231 Encounter for screening mammogram for malignant neoplasm of breast: Secondary | ICD-10-CM | POA: Diagnosis not present

## 2023-10-01 DIAGNOSIS — G8250 Quadriplegia, unspecified: Secondary | ICD-10-CM

## 2023-10-01 MED ORDER — LEVOTHYROXINE SODIUM 50 MCG PO TABS
50.0000 ug | ORAL_TABLET | Freq: Every day | ORAL | 2 refills | Status: DC
  Filled 2023-10-01 – 2023-11-22 (×2): qty 100, 100d supply, fill #0
  Filled 2024-02-26: qty 100, 100d supply, fill #1
  Filled 2024-05-19: qty 100, 100d supply, fill #2

## 2023-10-01 MED ORDER — WARFARIN SODIUM 4 MG PO TABS
ORAL_TABLET | ORAL | 2 refills | Status: DC
  Filled 2023-10-01: qty 60, fill #0

## 2023-10-01 NOTE — Assessment & Plan Note (Signed)
Repeating TSH and T4 today.  Continue levothyroxine 50 mcg daily unless lab results warrant change.  Will continue to monitor.

## 2023-10-01 NOTE — Assessment & Plan Note (Signed)
Asymptomatic, blood pressure today 101/68.  Stable, no need for medication at this time.  Will continue to monitor, encouraged to maintain adequate hydration and nutrition.

## 2023-10-01 NOTE — Assessment & Plan Note (Signed)
Rechecking INR today.  The past 2 weeks after taking Diflucan, INR has been above goal.  On 09/17/2023 INR 3.7, on 09/24/2023 INR 3.1.  Medication was temporarily decreased for that time, rechecking INR today.  If within goal range recommend resuming typical warfarin routine: Warfarin 2 mg Tuesday, Wednesday, Thursday, Saturday, Sunday/warfarin 4 mg Monday and Friday.

## 2023-10-01 NOTE — Progress Notes (Signed)
Established Patient Office Visit  Subjective   Patient ID: [name], female    DOB: February 05, 1956  Age: 67 y.o. MRN: [id number]  Chief Complaint  Patient presents with   Hypotension    HPI [name] is a 67 y.o. female presenting today for follow up of hypotension, hypothyroidism, tetraplegia on anticoagulation.  Blood pressure stable at home. Taking levothyroxine regularly in the AM away from food and vitamins. Denies fatigue, weight changes, heat/cold intolerance, skin/hair changes, bowel changes, CVS symptoms.  Taking warfarin as prescribed with the exception of the past 2 weeks where doses have been adjusted due to elevated INR.   Outpatient Medications Prior to Visit  Medication Sig   acetaminophen-codeine (TYLENOL #3) 300-30 MG tablet Take 1-2 tablets by mouth every 4 (four) hours as needed for moderate pain.   ALPRAZolam (XANAX) 0.5 MG tablet Take 1/2 tablet by mouth as needed for anxiety r/t car travel.   baclofen (LIORESAL) 20 MG tablet Take 1 tablet (20 mg total) by mouth 2 (two) times daily.   bumetanide (BUMEX) 1 MG tablet Take 1 tablet (1 mg total) by mouth daily.   Cholecalciferol (VITAMIN D PO) Take 1 tablet by mouth daily. 125 units   collagenase (SANTYL) ointment Apply 1 application topically daily.   Cranberry 400 MG CAPS Take 10.5 capsules (4,200 mg total) by mouth daily.   diazepam (VALIUM) 5 MG tablet Take 1 tablet (5 mg total) by mouth 2 (two) times daily as needed.   Fexofenadine HCl (ALLEGRA PO) Take by mouth daily.   Multiple Vitamins-Minerals (CENTRUM PO) Take 1 tablet by mouth every morning.    neomycin-polymyxin B (NEOSPORIN) 40-200000 irrigation solution Irrigate with as directed as needed.   nystatin (MYCOSTATIN) powder Apply topically 3 (three) times daily as needed.   Olopatadine HCl (PATADAY OP) Apply to eye.   oxybutynin (DITROPAN) 5 MG tablet Take 1 tablet (5 mg total) by mouth 2 (two) times daily.   polycarbophil (FIBERCON) 625 MG tablet  Take 625 mg by mouth 3 (three) times daily.    potassium chloride (KLOR-CON) 10 MEQ tablet Take 1 tablet (10 mEq total) by mouth 3 (three) times daily.   pravastatin (PRAVACHOL) 40 MG tablet Take 1 tablet (40 mg total) by mouth at bedtime.   Protein 500 MG CHEW One protein bar daily (Patient taking differently: One protein bar daily 20G)   senna (SENOKOT) 8.6 MG TABS tablet Take 8 tablets by mouth once a week. Friday night prior to "bowel program."   silver sulfADIAZINE (SILVADENE) 1 % cream Apply 1 application topically 2 (two) times daily as needed.   vitamin C (VITAMIN C) 500 MG tablet Take 1 tablet (500 mg total) by mouth daily. (Patient taking differently: Take 2,000 mg by mouth daily.)   warfarin (COUMADIN) 2 MG tablet Take 1 tablet by mouth every Tuesday, Wednesday, Thursday, Saturday, and Sunday   zinc sulfate 220 MG capsule Take 1 capsule (220 mg total) by mouth daily. (Patient taking differently: Take 50 mg by mouth daily.)   [DISCONTINUED] fluconazole (DIFLUCAN) 200 MG tablet Take 1 tablet (200 mg total) by mouth daily.   [DISCONTINUED] levothyroxine (SYNTHROID) 50 MCG tablet Take 1 tablet (50 mcg total) by mouth daily.   [DISCONTINUED] warfarin (COUMADIN) 4 MG tablet Take 1 tablet by mouth every Monday and Friday   No facility-administered medications prior to visit.    ROS Negative unless otherwise noted in HPI   Objective:     BP 104/66 (BP Location: Right  Arm, Patient Position: Sitting, Cuff Size: Large)   Pulse 69   Resp 20   SpO2 97%   Physical Exam Constitutional:      General: She is not in acute distress.    Appearance: Normal appearance.  HENT:     Head: Normocephalic and atraumatic.  Cardiovascular:     Rate and Rhythm: Normal rate and regular rhythm.     Heart sounds: No murmur heard.    No friction rub. No gallop.  Pulmonary:     Effort: Pulmonary effort is normal. No respiratory distress.     Breath sounds: No wheezing, rhonchi or rales.  Skin:     General: Skin is warm and dry.  Neurological:     Mental Status: She is alert and oriented to person, place, and time.     Assessment & Plan:  Hypotension, unspecified hypotension type Assessment & Plan: Asymptomatic, blood pressure today 101/68.  Stable, no need for medication at this time.  Will continue to monitor, encouraged to maintain adequate hydration and nutrition.   Other specified hypothyroidism Assessment & Plan: Repeating TSH and T4 today.  Continue levothyroxine 50 mcg daily unless lab results warrant change.  Will continue to monitor.  Orders: -     TSH Rfx on Abnormal to Free T4; Future -     Levothyroxine Sodium; Take 1 tablet (50 mcg total) by mouth daily.  Dispense: 100 tablet; Refill: 2  Tetraplegia (HCC) Assessment & Plan: Rechecking INR today.  The past 2 weeks after taking Diflucan, INR has been above goal.  On 09/17/2023 INR 3.7, on 09/24/2023 INR 3.1.  Medication was temporarily decreased for that time, rechecking INR today.  If within goal range recommend resuming typical warfarin routine: Warfarin 2 mg Tuesday, Wednesday, Thursday, Saturday, Sunday/warfarin 4 mg Monday and Friday.  Orders: -     CBC with Differential/Platelet; Future -     Comprehensive metabolic panel; Future -     Hemoglobin A1c; Future -     Lipid panel; Future -     Protime-INR; Future -     Warfarin Sodium; Take 1 tablet by mouth every Monday and Friday  Dispense: 60 tablet; Refill: 2  Hyperglycemia -     Hemoglobin A1c; Future  Fatigue, unspecified type -     VITAMIN D 25 Hydroxy (Vit-D Deficiency, Fractures); Future -     Iron and TIBC; Future  Need for pneumococcal 20-valent conjugate vaccination -     Pneumococcal conjugate vaccine 20-valent  Need for influenza vaccination -     Flu Vaccine Trivalent High Dose (Fluad)  Screening mammogram for breast cancer -     3D Screening Mammogram, Left and Right; Future  Postmenopausal estrogen deficiency -     DG Bone Density;  Future    Return in about 6 months (around 03/31/2024) for follow-up for hypoTN, thyroid, medication monitoring, fasting blood work.    Melida Quitter, PA

## 2023-10-01 NOTE — Patient Instructions (Addendum)
#  1 calcium-total of 1200 mg of calcium daily.  If you eat a very calcium rich diet you may be able to obtain that without a supplement.  If not, then I recommend calcium 500 mg twice a day.  There are several products over-the-counter such as Caltrate D and Viactiv chews which are great options that contain calcium and vitamin D. #2 vitamin D-recommend 800 international units daily.  I will let you know what I can find out about the mammogram and bone density scan.

## 2023-10-02 ENCOUNTER — Other Ambulatory Visit: Payer: Self-pay

## 2023-10-02 ENCOUNTER — Other Ambulatory Visit: Payer: Self-pay | Admitting: Family Medicine

## 2023-10-02 ENCOUNTER — Other Ambulatory Visit (HOSPITAL_COMMUNITY): Payer: Self-pay

## 2023-10-02 DIAGNOSIS — E876 Hypokalemia: Secondary | ICD-10-CM

## 2023-10-02 LAB — IRON AND TIBC
Iron Saturation: 16 % (ref 15–55)
Iron: 39 ug/dL (ref 27–139)
Total Iron Binding Capacity: 243 ug/dL — ABNORMAL LOW (ref 250–450)
UIBC: 204 ug/dL (ref 118–369)

## 2023-10-02 LAB — COMPREHENSIVE METABOLIC PANEL
ALT: 30 [IU]/L (ref 0–32)
AST: 27 [IU]/L (ref 0–40)
Albumin: 3.1 g/dL — ABNORMAL LOW (ref 3.9–4.9)
Alkaline Phosphatase: 119 [IU]/L (ref 44–121)
BUN/Creatinine Ratio: 25 (ref 12–28)
BUN: 15 mg/dL (ref 8–27)
Bilirubin Total: 0.3 mg/dL (ref 0.0–1.2)
CO2: 28 mmol/L (ref 20–29)
Calcium: 9.1 mg/dL (ref 8.7–10.3)
Chloride: 102 mmol/L (ref 96–106)
Creatinine, Ser: 0.59 mg/dL (ref 0.57–1.00)
Globulin, Total: 4 g/dL (ref 1.5–4.5)
Glucose: 102 mg/dL — ABNORMAL HIGH (ref 70–99)
Potassium: 3.3 mmol/L — ABNORMAL LOW (ref 3.5–5.2)
Sodium: 143 mmol/L (ref 134–144)
Total Protein: 7.1 g/dL (ref 6.0–8.5)
eGFR: 98 mL/min/{1.73_m2} (ref >59)

## 2023-10-02 LAB — CBC WITH DIFFERENTIAL/PLATELET
Basophils Absolute: 0 10*3/uL (ref 0.0–0.2)
Basos: 0 %
EOS (ABSOLUTE): 0 10*3/uL (ref 0.0–0.4)
Eos: 0 %
Hematocrit: 37.4 % (ref 34.0–46.6)
Hemoglobin: 12 g/dL (ref 11.1–15.9)
Immature Grans (Abs): 0 10*3/uL (ref 0.0–0.1)
Immature Granulocytes: 0 %
Lymphocytes Absolute: 2.2 10*3/uL (ref 0.7–3.1)
Lymphs: 22 %
MCH: 31.1 pg (ref 26.6–33.0)
MCHC: 32.1 g/dL (ref 31.5–35.7)
MCV: 97 fL (ref 79–97)
Monocytes Absolute: 0.8 10*3/uL (ref 0.1–0.9)
Monocytes: 8 %
Neutrophils Absolute: 7 10*3/uL (ref 1.4–7.0)
Neutrophils: 70 %
Platelets: 322 10*3/uL (ref 150–450)
RBC: 3.86 x10E6/uL (ref 3.77–5.28)
RDW: 12.9 % (ref 11.7–15.4)
WBC: 10.1 10*3/uL (ref 3.4–10.8)

## 2023-10-02 LAB — HEMOGLOBIN A1C
Est. average glucose Bld gHb Est-mCnc: 108 mg/dL
Hgb A1c MFr Bld: 5.4 % (ref 4.8–5.6)

## 2023-10-02 LAB — PROTIME-INR
INR: 3 — ABNORMAL HIGH (ref 0.9–1.2)
Prothrombin Time: 31.2 s — ABNORMAL HIGH (ref 9.1–12.0)

## 2023-10-02 LAB — VITAMIN D 25 HYDROXY (VIT D DEFICIENCY, FRACTURES): Vit D, 25-Hydroxy: 72.1 ng/mL (ref 30.0–100.0)

## 2023-10-02 LAB — LIPID PANEL
Chol/HDL Ratio: 4.3 ratio (ref 0.0–4.4)
Cholesterol, Total: 162 mg/dL (ref 100–199)
HDL: 38 mg/dL — ABNORMAL LOW (ref >39)
LDL Chol Calc (NIH): 95 mg/dL (ref 0–99)
Triglycerides: 164 mg/dL — ABNORMAL HIGH (ref 0–149)
VLDL Cholesterol Cal: 29 mg/dL (ref 5–40)

## 2023-10-02 LAB — TSH RFX ON ABNORMAL TO FREE T4: TSH: 2.65 u[IU]/mL (ref 0.450–4.500)

## 2023-10-02 MED ORDER — POTASSIUM CHLORIDE ER 10 MEQ PO TBCR
10.0000 meq | EXTENDED_RELEASE_TABLET | Freq: Three times a day (TID) | ORAL | 1 refills | Status: DC
  Filled 2023-10-02 – 2024-01-02 (×2): qty 270, 90d supply, fill #0
  Filled 2024-03-26: qty 270, 90d supply, fill #1

## 2023-10-08 ENCOUNTER — Ambulatory Visit (HOSPITAL_BASED_OUTPATIENT_CLINIC_OR_DEPARTMENT_OTHER): Payer: HMO | Admitting: General Surgery

## 2023-10-10 DIAGNOSIS — N3020 Other chronic cystitis without hematuria: Secondary | ICD-10-CM | POA: Diagnosis not present

## 2023-10-10 DIAGNOSIS — N200 Calculus of kidney: Secondary | ICD-10-CM | POA: Diagnosis not present

## 2023-10-10 DIAGNOSIS — N261 Atrophy of kidney (terminal): Secondary | ICD-10-CM | POA: Diagnosis not present

## 2023-10-10 DIAGNOSIS — R339 Retention of urine, unspecified: Secondary | ICD-10-CM | POA: Diagnosis not present

## 2023-10-10 DIAGNOSIS — N319 Neuromuscular dysfunction of bladder, unspecified: Secondary | ICD-10-CM | POA: Diagnosis not present

## 2023-10-10 DIAGNOSIS — G8250 Quadriplegia, unspecified: Secondary | ICD-10-CM | POA: Diagnosis not present

## 2023-10-12 ENCOUNTER — Telehealth: Payer: Self-pay | Admitting: *Deleted

## 2023-10-12 NOTE — Telephone Encounter (Signed)
Starla RN  from [facility] just calling to say that they will be discharging patient.  She only wants the bath aid on Wednesday mornings and they can't guarantee that she can be there on Wednesdays in the morning.  She also requested the aid to change her super pubic catheter which is not in her scope of practice.  And said that if they can't do these things she doesn't need them there.  She has refused vitals and also they got her set up with Social work and she refused any help from them. If you have any questions please feel free to contact Starla at [phone].

## 2023-10-15 ENCOUNTER — Telehealth: Payer: Self-pay

## 2023-10-15 ENCOUNTER — Other Ambulatory Visit: Payer: Self-pay | Admitting: Family Medicine

## 2023-10-15 LAB — POCT INR: INR: 4.5 — AB (ref 0.80–1.20)

## 2023-10-15 NOTE — Telephone Encounter (Signed)
Pt calling to report INR was 4.5 today

## 2023-10-15 NOTE — Telephone Encounter (Signed)
Please have her stop taking the Coumadin completely until the INR is less than 3, then let me know when that is

## 2023-10-15 NOTE — Telephone Encounter (Signed)
Hold Coumadin today and tomorrow.  Check INR on Wednesday and let us know what it is so that we can adjust regimen accordingly.  Orders were placed for home health for nursing to evaluate and develop appropriate care plan.  [facility] provided guidelines for a care plan that maintained maximum independence while under supervision of skilled nursing.  If her expectations were different from the care that she received, it is possible that skilled nursing with home health is not the appropriate service and she may need to look into what organizations in [place] provide the assistance that she is looking for.  For provider documentation (do not need to relay to patient): Hold warfarin and recheck INR on Wednesday.  If INR remains elevated, change medication routine to 2 mg warfarin daily except Monday and Friday, hold warfarin on Monday and Friday.

## 2023-10-15 NOTE — Telephone Encounter (Signed)
Patient was provided instructions and has follow up questions/concerns. Patient wants to know how long she should wait before checking her INR levles? Also, patient wants to know what all was included in home health orders because aide is not helping her with anything. Per last fax from [facility], patient was discharged as a client on 10/10/23.

## 2023-10-16 NOTE — Telephone Encounter (Signed)
Per provider advisement, patient was instructed to withhold dosage today and to only take 2mg  for the rest of the week. Patient will check INR levels on Monday and report readings to provider.

## 2023-10-22 ENCOUNTER — Telehealth: Payer: Self-pay | Admitting: *Deleted

## 2023-10-22 ENCOUNTER — Encounter (HOSPITAL_BASED_OUTPATIENT_CLINIC_OR_DEPARTMENT_OTHER): Payer: HMO | Attending: General Surgery | Admitting: General Surgery

## 2023-10-22 DIAGNOSIS — G8250 Quadriplegia, unspecified: Secondary | ICD-10-CM | POA: Diagnosis not present

## 2023-10-22 DIAGNOSIS — L97825 Non-pressure chronic ulcer of other part of left lower leg with muscle involvement without evidence of necrosis: Secondary | ICD-10-CM | POA: Insufficient documentation

## 2023-10-22 DIAGNOSIS — L97828 Non-pressure chronic ulcer of other part of left lower leg with other specified severity: Secondary | ICD-10-CM | POA: Insufficient documentation

## 2023-10-22 DIAGNOSIS — E039 Hypothyroidism, unspecified: Secondary | ICD-10-CM | POA: Diagnosis not present

## 2023-10-22 DIAGNOSIS — Z86718 Personal history of other venous thrombosis and embolism: Secondary | ICD-10-CM | POA: Insufficient documentation

## 2023-10-22 DIAGNOSIS — L89893 Pressure ulcer of other site, stage 3: Secondary | ICD-10-CM | POA: Diagnosis not present

## 2023-10-22 DIAGNOSIS — S80212A Abrasion, left knee, initial encounter: Secondary | ICD-10-CM | POA: Diagnosis not present

## 2023-10-22 DIAGNOSIS — L89623 Pressure ulcer of left heel, stage 3: Secondary | ICD-10-CM | POA: Diagnosis not present

## 2023-10-22 NOTE — Progress Notes (Signed)
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 History  Items Eyes: Cataracts Immunizations Pneumococcal Vaccine: Received Pneumococcal Vaccination: Yes Received Pneumococcal Vaccination On or After 60th Birthday: No Tetanus Vaccine: Last tetanus shot: 12/04/2010 Implantable Devices No devices added Family and Social History Unknown History: Yes; Never smoker; Marital Status - Divorced; Alcohol Use: Never; Drug Use: No History; Caffeine Use: Rarely; Financial Concerns: No; Food, Clothing or Shelter Needs: No; Support System Lacking: No; Transportation Concerns: Yes - 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 10/22/2023 Medical Record Number: [id number] Patient Account Number: [id number] Date of Birth/Sex: Treating RN: 12-29-1955 (89 Walker.o. 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 without evidence of necrosis S14.109S Unspecified injury at unspecified level of cervical spinal cord, sequela Facility Procedures : DAILENE, BRADLE Code: [id number] Angelena Form [phone] L L L Description: 11042 - DEB SUBQ TISSUE 20 SQ CM/< ICD-10 Diagnosis Description 988) [phone] 89.623 Pressure ulcer of left heel, stage 3 97.828 Non-pressure chronic ulcer of other part of left lower leg with other specified sever 97.825  Non-pressure chronic ulcer of other part of left lower leg with  muscle involvement wi Modifier: _[id number] ity thout evidence o Quantity: 1 .pdf Page 13 of 13 f necrosis Physician Procedures : CPT4 Code Description Modifier [id number] 99214 - WC PHYS LEVEL 4 - EST PT ICD-10 Diagnosis Description L89.623 Pressure ulcer of left heel, stage 3 L97.828 Non-pressure chronic ulcer of other part of left lower leg with other specified severity L97.825  Non-pressure chronic ulcer of other part of left lower leg with muscle involvement without evidence o S14.109S Unspecified injury at unspecified level of  cervical spinal cord, sequela Quantity: 1 f necrosis : [id number] 11042 - WC PHYS SUBQ TISS 20 SQ CM ICD-10 Diagnosis Description L89.623 Pressure ulcer of left heel, stage 3 L97.828 Non-pressure chronic ulcer of other part of left lower leg with other specified severity L97.825 Non-pressure chronic ulcer of  other part of left lower leg with muscle involvement without evidence o Quantity: 1 f necrosis Electronic Signature(s) Signed: 10/22/2023 12:10:37 PM By: Duanne Guess MD FACS Entered By: Duanne Guess on 10/22/2023 09:10:36

## 2023-10-22 NOTE — Progress Notes (Signed)
[name] ([id number]) [id number] Page 1 of 10 Visit Report for 10/22/2023 Arrival Information Details Patient Name: Date of Service: [place], [name] 10/22/2023 11:15 A M Medical Record Number: [id number] Patient Account Number: [id number] Date of Birth/Sex: Treating RN: 08/01/1956 (67 Walker.o. [name] Primary Care Baylie Drakes: Saralyn Pilar Other Clinician: Referring Leslyn Monda: Treating October Peery/Extender: Park Liter in Treatment: 16 Visit Information History Since Last Visit Added or deleted any medications: No Patient Arrived: Wheel Chair Any new allergies or adverse reactions: No Arrival Time: 11:15 Had a fall or experienced change in No Accompanied By: Daughter activities of daily living that may affect Transfer Assistance: None risk of falls: Patient Identification Verified: Yes Signs or symptoms of abuse/neglect since last visito No Secondary Verification Process Completed: Yes Hospitalized since last visit: No Patient Requires Transmission-Based Precautions: No Implantable device outside of the clinic excluding No Patient Has Alerts: No cellular tissue based products placed in the center since last visit: Has Dressing in Place as Prescribed: Yes Pain Present Now: No Electronic Signature(s) Signed: 10/22/2023 4:36:03 PM By: Redmond Pulling RN, BSN Entered By: Redmond Pulling on 10/22/2023 08:17:30 -------------------------------------------------------------------------------- Encounter Discharge Information Details Patient Name: Date of Service: [name]. 10/22/2023 11:15 A M Medical Record Number: [id number] Patient Account Number: [id number] Date of Birth/Sex: Treating RN: 01/07/1956 (22 Walker.o. [name] Primary Care Alyha Marines: Saralyn Pilar Other Clinician: Referring Johnatan Baskette: Treating Cerena Baine/Extender: Park Liter in Treatment: 16 Encounter Discharge  Information Items Post Procedure Vitals Discharge Condition: Stable Temperature (F): 97.9 Ambulatory Status: Wheelchair Pulse (bpm): 73 Discharge Destination: Home Respiratory Rate (breaths/min): 18 Transportation: Private Auto Blood Pressure (mmHg): 91/57 Accompanied By: daughter Schedule Follow-up Appointment: Yes Clinical Summary of Care: Patient Declined Electronic Signature(s) Signed: 10/22/2023 4:36:03 PM By: Redmond Pulling RN, BSN Entered By: Redmond Pulling on 10/22/2023 12:24:20 [place], [name] ([id number]) [id number] Page 2 of 10 -------------------------------------------------------------------------------- Lower Extremity Assessment Details Patient Name: Date of Service: [name], [name] 10/22/2023 11:15 A M Medical Record Number: [id number] Patient Account Number: [id number] Date of Birth/Sex: Treating RN: 1956-07-04 (67 Walker.o. [name] Primary Care Manon Banbury: Saralyn Pilar Other Clinician: Referring Rowan Blaker: Treating Myli Pae/Extender: Harrie Jeans Weeks in Treatment: 16 Edema Assessment Assessed: Kyra Searles: No] Franne Forts: No] Edema: [Left: Ye] [Right: s] Calf Left: Right: Point of Measurement: From Medial Instep 24.5 cm Ankle Left: Right: Point of Measurement: From Medial Instep 20 cm Vascular Assessment Pulses: Dorsalis Pedis Palpable: [Left:Yes] Extremity colors, hair growth, and conditions: Extremity Color: [Left:Normal] Hair Growth on Extremity: [Left:Yes] Temperature of Extremity: [Left:Warm] Capillary Refill: [Left:< 3 seconds] Dependent Rubor: [Left:No No] Electronic Signature(s) Signed: 10/22/2023 4:36:03 PM By: Redmond Pulling RN, BSN Entered By: Redmond Pulling on 10/22/2023 08:28:13 -------------------------------------------------------------------------------- Multi Wound Chart Details Patient Name: Date of Service: [name]. 10/22/2023 11:15 A M Medical Record Number:  [id number] Patient Account Number: [id number] Date of Birth/Sex: Treating RN: 08-Mar-1956 (63 Walker.o. F) Primary Care Annastacia Duba: Saralyn Pilar Other Clinician: Referring Zamarah Ullmer: Treating Liesel Peckenpaugh/Extender: Park Liter in Treatment: 16 Vital Signs Height(in): Pulse(bpm): 73 Weight(lbs): Blood Pressure(mmHg): 91/57 Body Mass Index(BMI): Temperature(F): 97.9 Respiratory Rate(breaths/min): 18 [25:Photos:] [27:[id number] Page 3 of 10] Left, Lateral Lower Leg Left Calcaneus Left, Lateral Knee Wound Location: Pressure Injury Pressure Injury Skin T ear/Laceration Wounding Event: Pressure Ulcer Pressure Ulcer Abrasion Primary Etiology: Venous Leg Ulcer N/A N/A Secondary Etiology: Cataracts, Deep Vein Thrombosis, Cataracts, Deep Vein Thrombosis, Cataracts, Deep Vein Thrombosis, Comorbid History: Hypotension,  Neuropathy, QuadriplegiaHypotension, Neuropathy, QuadriplegiaHypotension, Neuropathy, Quadriplegia 06/03/2021 06/03/2021 10/17/2023 Date Acquired: 16 16 0 Weeks of Treatment: Open Open Open Wound Status: No No No Wound Recurrence: Yes No No Clustered Wound: 4x0.5x0.1 1.5x2x0.1 1.3x1x0.1 Measurements L x W x D (cm) 1.571 2.356 1.021 A (cm) : rea 0.157 0.236 0.102 Volume (cm) : -565.70% -7500.00% N/A % Reduction in A rea: -234.00% -7766.70% N/A % Reduction in Volume: Category/Stage III Category/Stage III Full Thickness Without Exposed Classification: Support Structures Medium Medium Small Exudate A mount: Serosanguineous Serosanguineous Serosanguineous Exudate Type: red, brown red, brown red, brown Exudate Color: Flat and Intact Flat and Intact N/A Wound Margin: Large (67-100%) Large (67-100%) None Present (0%) Granulation A mount: Red N/A N/A Granulation Quality: Small (1-33%) Small (1-33%) Large (67-100%) Necrotic A mount: Adherent Slough Adherent [employer], Adherent Slough Necrotic Tissue: Fat Layer  (Subcutaneous Tissue): Yes Fat Layer (Subcutaneous Tissue): Yes Fat Layer (Subcutaneous Tissue): Yes Exposed Structures: Fascia: No Fascia: No Fascia: No Tendon: No Tendon: No Tendon: No Muscle: No Muscle: No Muscle: No Joint: No Joint: No Joint: No Bone: No Bone: No Bone: No Small (1-33%) Small (1-33%) None Epithelialization: Debridement - Excisional Debridement - Excisional Debridement - Excisional Debridement: Pre-procedure Verification/Time Out 11:42 11:42 11:42 Taken: Lidocaine 4% Topical Solution Lidocaine 4% Topical Solution Lidocaine 4% Topical Solution Pain Control: Subcutaneous, Slough Necrotic/Eschar, Subcutaneous, Necrotic/Eschar, Subcutaneous, Tissue Debrided: [employer] Skin/Subcutaneous Tissue Skin/Subcutaneous Tissue Skin/Subcutaneous Tissue Level: 0.39 2.36 1.02 Debridement A (sq cm): rea Curette Curette Curette Instrument: Minimum Minimum Minimum Bleeding: Pressure Pressure Pressure Hemostasis A chieved: Procedure was tolerated well Procedure was tolerated well Procedure was tolerated well Debridement Treatment Response: 4x0.5x0.1 1.5x2x0.2 1.3x1x0.5 Post Debridement Measurements L x W x D (cm) 0.157 0.471 0.511 Post Debridement Volume: (cm) Category/Stage III Category/Stage III N/A Post Debridement Stage: No Abnormalities Noted Scarring: Yes Periwound Skin Texture: No Abnormalities Noted No Abnormalities Noted Periwound Skin Moisture: No Abnormalities Noted No Abnormalities Noted Periwound Skin Color: No Abnormality No Abnormality N/A Temperature: Debridement Debridement Debridement Procedures Performed: Treatment Notes Electronic Signature(s) Signed: 10/22/2023 11:59:29 AM By: Duanne Guess MD FACS Entered By: Duanne Guess on 10/22/2023 08:59:29 -------------------------------------------------------------------------------- Multi-Disciplinary Care Plan Details Patient Name: Date of Service: [name]. 10/22/2023  11:15 A M Medical Record Number: [id number] Patient Account Number: [id number] GENISES, SCADUTO ([id number]) [id number] Page 4 of 10 Date of Birth/Sex: Treating RN: 06-02-1956 (69 Walker.o. [name] Primary Care Hanifa Antonetti: Other Clinician: Saralyn Pilar Referring Zane Samson: Treating Dawson Albers/Extender: Park Liter in Treatment: 16 Multidisciplinary Care Plan reviewed with physician Active Inactive Pressure Nursing Diagnoses: Knowledge deficit related to management of pressures ulcers Potential for impaired tissue integrity related to pressure, friction, moisture, and shear Goals: Patient/caregiver will verbalize understanding of pressure ulcer management Date Initiated: 07/02/2023 Target Resolution Date: 10/08/2023 Goal Status: Active Interventions: Assess: immobility, friction, shearing, incontinence upon admission and as needed Assess offloading mechanisms upon admission and as needed Assess potential for pressure ulcer upon admission and as needed Provide education on pressure ulcers Notes: Wound/Skin Impairment Nursing Diagnoses: Impaired tissue integrity Knowledge deficit related to ulceration/compromised skin integrity Goals: Patient/caregiver will verbalize understanding of skin care regimen Date Initiated: 07/02/2023 Target Resolution Date: 10/08/2023 Goal Status: Active Ulcer/skin breakdown will have a volume reduction of 30% by week 4 Date Initiated: 07/02/2023 Date Inactivated: 08/13/2023 Target Resolution Date: 07/30/2023 Goal Status: Unmet Unmet Reason: pressure relief Interventions: Assess patient/caregiver ability to obtain necessary supplies Assess patient/caregiver ability to perform ulcer/skin care regimen upon admission and as needed  Assess ulceration(s) every visit Provide education on ulcer and skin care Treatment Activities: Skin care regimen initiated : 07/02/2023 Topical wound management initiated :  07/02/2023 Notes: Electronic Signature(s) Signed: 10/22/2023 4:36:03 PM By: Redmond Pulling RN, BSN Entered By: Redmond Pulling on 10/22/2023 12:18:03 -------------------------------------------------------------------------------- Pain Assessment Details Patient Name: Date of Service: [name]. 10/22/2023 11:15 A M Medical Record Number: [id number] Patient Account Number: [id number] Date of Birth/Sex: Treating RN: March 03, 1956 (82 Walker.o. [name] Primary Care Jolin Benavides: Saralyn Pilar Other Clinician: Referring Willam Munford: Treating Thora Scherman/Extender: Park Liter in Treatment: [address], [place] Walker ([id number]) [phone].pdf Page 5 of 10 Active Problems Location of Pain Severity and Description of Pain Patient Has Paino No Site Locations Pain Management and Medication Current Pain Management: Electronic Signature(s) Signed: 10/22/2023 4:36:03 PM By: Redmond Pulling RN, BSN Entered By: Redmond Pulling on 10/22/2023 08:18:13 -------------------------------------------------------------------------------- Patient/Caregiver Education Details Patient Name: Date of Service: Angela Husky, MA Rosezena Sensor 11/18/2024andnbsp11:15 A M Medical Record Number: [id number] Patient Account Number: [id number] Date of Birth/Gender: Treating RN: [birth year] (6 Walker.o. [name] Primary Care Physician: Saralyn Pilar Other Clinician: Referring Physician: Treating Physician/Extender: Park Liter in Treatment: 16 Education Assessment Education Provided To: Patient Education Topics Provided Wound/Skin Impairment: Methods: Explain/Verbal Responses: State content correctly [employer]) Signed: 10/22/2023 4:36:03 PM By: Redmond Pulling RN, BSN Entered By: Redmond Pulling on 10/22/2023 12:23:02 [place] ([id number]) [id number] Page 6 of  10 -------------------------------------------------------------------------------- Wound Assessment Details Patient Name: Date of Service: [name], [place] YIRS W. 10/22/2023 11:15 A M Medical Record Number: [id number] Patient Account Number: [id number] Date of Birth/Sex: Treating RN: 09/02/1956 (72 Walker.o. [name] Primary Care Ronne Stefanski: Saralyn Pilar Other Clinician: Referring Amando Ishikawa: Treating Florice Hindle/Extender: Harrie Jeans Weeks in Treatment: 16 Wound Status Wound Number: 25 Primary Pressure Ulcer Etiology: Wound Location: Left, Lateral Lower Leg Secondary Venous Leg Ulcer Wounding Event: Pressure Injury Etiology: Date Acquired: 06/03/2021 Wound Status: Open Weeks Of Treatment: 16 Comorbid Cataracts, Deep Vein Thrombosis, Hypotension, Neuropathy, Clustered Wound: Yes History: Quadriplegia Photos Wound Measurements Length: (cm) 4 Width: (cm) 0.5 Depth: (cm) 0.1 Area: (cm) 1.571 Volume: (cm) 0.157 % Reduction in Area: -565.7% % Reduction in Volume: -234% Epithelialization: Small (1-33%) Tunneling: No Undermining: No Wound Description Classification: Category/Stage III Wound Margin: Flat and Intact Exudate Amount: Medium Exudate Type: Serosanguineous Exudate Color: red, brown Foul Odor After Cleansing: No Slough/Fibrino Yes Wound Bed Granulation Amount: Large (67-100%) Exposed Structure Granulation Quality: Red Fascia Exposed: No Necrotic Amount: Small (1-33%) Fat Layer (Subcutaneous Tissue) Exposed: Yes Necrotic Quality: Adherent Slough Tendon Exposed: No Muscle Exposed: No Joint Exposed: No Bone Exposed: No Periwound Skin Texture Texture Color No Abnormalities Noted: Yes No Abnormalities Noted: Yes Moisture Temperature / Pain No Abnormalities Noted: Yes Temperature: No Abnormality Treatment Notes Wound #25 (Lower Leg) Wound Laterality: Left, Lateral Cleanser Peri-Wound Care Topical Primary Dressing Hydrofera Blue Ready  Transfer Foam, 2.5x2.5 (in/in) Discharge Instruction: Apply directly to wound bed as directed VINAYA, FODERA ([id number]) [id number] Page 7 of 10 Secondary Dressing Zetuvit Plus Silicone Border Dressing 5x5 (in/in) Discharge Instruction: Apply silicone border over primary dressing as directed. Secured With Compression Wrap Compression Stockings Facilities manager) Signed: 10/22/2023 4:36:03 PM By: Redmond Pulling RN, BSN Entered By: Redmond Pulling on 10/22/2023 08:32:52 -------------------------------------------------------------------------------- Wound Assessment Details Patient Name: Date of Service: [name]. 10/22/2023 11:15 A M Medical Record Number: [id number] Patient Account Number: [id number] Date of Birth/Sex: Treating RN: May 15, 1956 (54 Walker.o. F)  Redmond Pulling Primary Care Mirka Barbone: Saralyn Pilar Other Clinician: Referring Terrie Grajales: Treating Nimrod Wendt/Extender: Harrie Jeans Weeks in Treatment: 16 Wound Status Wound Number: 26 Primary Pressure Ulcer Etiology: Wound Location: Left Calcaneus Wound Status: Open Wounding Event: Pressure Injury Comorbid Cataracts, Deep Vein Thrombosis, Hypotension, Neuropathy, Date Acquired: 06/03/2021 History: Quadriplegia Weeks Of Treatment: 16 Clustered Wound: No Photos Wound Measurements Length: (cm) 1.5 Width: (cm) 2 Depth: (cm) 0.1 Area: (cm) 2.356 Volume: (cm) 0.236 % Reduction in Area: -7500% % Reduction in Volume: -7766.7% Epithelialization: Small (1-33%) Tunneling: No Undermining: No Wound Description Classification: Category/Stage III Wound Margin: Flat and Intact Exudate Amount: Medium Exudate Type: Serosanguineous Exudate Color: red, brown Foul Odor After Cleansing: No Slough/Fibrino Yes Wound Bed Granulation Amount: Large (67-100%) Exposed Structure Necrotic Amount: Small (1-33%) Fascia Exposed: No Necrotic Quality: Adherent Slough Fat  Layer (Subcutaneous Tissue) Exposed: Yes Tendon Exposed: No KROSBY, BRING ([id number]) [id number] Page 8 of 10 Muscle Exposed: No Joint Exposed: No Bone Exposed: No Periwound Skin Texture Texture Color No Abnormalities Noted: No No Abnormalities Noted: Yes Scarring: Yes Temperature / Pain Temperature: No Abnormality Moisture No Abnormalities Noted: Yes Treatment Notes Wound #26 (Calcaneus) Wound Laterality: Left Cleanser Peri-Wound Care Topical Primary Dressing Hydrofera Blue Ready Transfer Foam, 2.5x2.5 (in/in) Discharge Instruction: Apply directly to wound bed as directed Secondary Dressing Zetuvit Plus Silicone Border Dressing 5x5 (in/in) Discharge Instruction: Apply silicone border over primary dressing as directed. Secured With Compression Wrap Compression Stockings Facilities manager) Signed: 10/22/2023 4:36:03 PM By: Redmond Pulling RN, BSN Entered By: Redmond Pulling on 10/22/2023 08:34:16 -------------------------------------------------------------------------------- Wound Assessment Details Patient Name: Date of Service: [name]. 10/22/2023 11:15 A M Medical Record Number: [id number] Patient Account Number: [id number] Date of Birth/Sex: Treating RN: [birth year] (89 Walker.o. [name] Primary Care Heriberto Stmartin: Saralyn Pilar Other Clinician: Referring Jovani Colquhoun: Treating Jahmar Mckelvy/Extender: Harrie Jeans Weeks in Treatment: 16 Wound Status Wound Number: 27 Primary Abrasion Etiology: Wound Location: Left, Lateral Knee Wound Status: Open Wounding Event: Skin Tear/Laceration Comorbid Cataracts, Deep Vein Thrombosis, Hypotension, Neuropathy, Date Acquired: 10/17/2023 History: Quadriplegia Weeks Of Treatment: 0 Clustered Wound: No Photos Tyrone, Haynes Walker ([id number]) [id number] Page 9 of 10 Wound Measurements Length: (cm) 1.3 Width: (cm) 1 Depth: (cm) 0.1 Area:  (cm) 1.021 Volume: (cm) 0.102 % Reduction in Area: % Reduction in Volume: Epithelialization: None Tunneling: No Undermining: No Wound Description Classification: Full Thickness Without Exposed Suppor Exudate Amount: Small Exudate Type: Serosanguineous Exudate Color: red, brown t Structures Foul Odor After Cleansing: No Slough/Fibrino Yes Wound Bed Granulation Amount: None Present (0%) Exposed Structure Necrotic Amount: Large (67-100%) Fascia Exposed: No Necrotic Quality: Eschar, Adherent Slough Fat Layer (Subcutaneous Tissue) Exposed: Yes Tendon Exposed: No Muscle Exposed: No Joint Exposed: No Bone Exposed: No Periwound Skin Texture Texture Color No Abnormalities Noted: No No Abnormalities Noted: No Moisture No Abnormalities Noted: No Treatment Notes Wound #27 (Knee) Wound Laterality: Left, Lateral Cleanser Peri-Wound Care Topical Primary Dressing Hydrofera Blue Ready Transfer Foam, 2.5x2.5 (in/in) Discharge Instruction: Apply directly to wound bed as directed Secondary Dressing Zetuvit Plus Silicone Border Dressing 5x5 (in/in) Discharge Instruction: Apply silicone border over primary dressing as directed. Secured With Compression Wrap Compression Stockings Facilities manager) Signed: 10/22/2023 4:36:03 PM By: Redmond Pulling RN, BSN Entered By: Redmond Pulling on 10/22/2023 08:27:22 ALIYAAH, AHLIN ([id number]) [id number] Page 10 of 10 -------------------------------------------------------------------------------- Vitals Details Patient Name: Date of Service: [name], [name] 10/22/2023 11:15 A M Medical Record Number: [id number] Patient Account Number: [id number] Date  of Birth/Sex: Treating RN: 1956-04-02 (69 Walker.o. [name] Primary Care Abagail Limb: Saralyn Pilar Other Clinician: Referring Idy Rawling: Treating Amit Leece/Extender: Harrie Jeans Weeks in Treatment: 16 Vital Signs Time Taken:  11:15 Temperature (F): 97.9 Pulse (bpm): 73 Respiratory Rate (breaths/min): 18 Blood Pressure (mmHg): 91/57 Reference Range: 80 - 120 mg / dl Electronic Signature(s) Signed: 10/22/2023 4:36:03 PM By: Redmond Pulling RN, BSN Entered By: Redmond Pulling on 10/22/2023 08:18:06

## 2023-10-22 NOTE — Telephone Encounter (Signed)
Pt informed of below.  

## 2023-10-22 NOTE — Telephone Encounter (Signed)
Pt calling to report that her INR is 2.4.  She said she still believes that it should still be reduced to 2 or 3 instead of 4. Please advise.     She is also inquiring about the paperwork that she left with provider to be completed and then mailed back to her.

## 2023-10-22 NOTE — Telephone Encounter (Signed)
If she would like to trial for the next week taking 2 mg every day other than Monday and Friday and changing those Monday/Friday doses from 4 mg to 3 mg, she can recheck her INR next week and let me know what it is.

## 2023-10-22 NOTE — Telephone Encounter (Signed)
INR 2.4 is great. Please clarify what she was referring to that should be reduced to 2 or 3.  I know that paperwork was completed, is currently in the accordion folder in the front office?

## 2023-10-22 NOTE — Telephone Encounter (Signed)
Confirmed with Delice Bison, CMA that she has mailed the paperwork pt was inquiring about. Pt stated that it was supposed be mailed to her and Delice Bison stated that she was not aware of that and sent it to the place that was listed on the form.  Pt also stated that she was thinking that she should reduce the 4 to 3 on her medication.  Please advise.

## 2023-10-23 DIAGNOSIS — L89623 Pressure ulcer of left heel, stage 3: Secondary | ICD-10-CM | POA: Diagnosis not present

## 2023-10-24 DIAGNOSIS — R339 Retention of urine, unspecified: Secondary | ICD-10-CM | POA: Diagnosis not present

## 2023-11-05 ENCOUNTER — Telehealth: Payer: Self-pay | Admitting: *Deleted

## 2023-11-05 ENCOUNTER — Other Ambulatory Visit (HOSPITAL_COMMUNITY): Payer: Self-pay

## 2023-11-05 DIAGNOSIS — G8250 Quadriplegia, unspecified: Secondary | ICD-10-CM

## 2023-11-05 MED ORDER — WARFARIN SODIUM 3 MG PO TABS
3.0000 mg | ORAL_TABLET | Freq: Every day | ORAL | 1 refills | Status: DC
  Filled 2023-11-05: qty 90, 90d supply, fill #0

## 2023-11-05 NOTE — Telephone Encounter (Signed)
Pt calling to report her PT INR, it is 2.4 which is in range she said.  She said that she has been taking the 3 for two weeks and she will need a new RX for the 3 mg sent to the [facility]

## 2023-11-05 NOTE — Telephone Encounter (Signed)
Prescription for warfarin 3 mg tablets sent to [facility] outpatient pharmacy.

## 2023-11-06 ENCOUNTER — Other Ambulatory Visit (HOSPITAL_COMMUNITY): Payer: Self-pay

## 2023-11-13 ENCOUNTER — Telehealth: Payer: Self-pay

## 2023-11-13 ENCOUNTER — Ambulatory Visit: Payer: HMO

## 2023-11-13 DIAGNOSIS — Z0000 Encounter for general adult medical examination without abnormal findings: Secondary | ICD-10-CM

## 2023-11-13 NOTE — Telephone Encounter (Signed)
Per Jairo Ben, PA-C, patient was advised that she can increase potassium to 4 tablets daily. Patient verbalized understanding. All questions and concerns have been addressed.
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
## 2023-11-13 NOTE — Patient Instructions (Signed)
[name] , Thank you for taking time to come for your Medicare Wellness Visit. I appreciate your ongoing commitment to your health goals. Please review the following plan we discussed and let me know if I can assist you in the future.   Referrals/Orders/Follow-Ups/Clinician Recommendations: none  This is a list of the screening recommended for you and due dates:  Health Maintenance  Topic Date Due   Mammogram  Never done   Zoster (Shingles) Vaccine (1 of 2) 02/11/2005   DEXA scan (bone density measurement)  Never done   DTaP/Tdap/Td vaccine (2 - Td or Tdap) 12/04/2020   COVID-19 Vaccine (3 - 2023-24 season) 11/29/2023*   Medicare Annual Wellness Visit  11/12/2024   Cologuard (Stool DNA test)  11/15/2025   Pneumonia Vaccine  Completed   Flu Shot  Completed   Hepatitis C Screening  Completed   HPV Vaccine  Aged Out  *Topic was postponed. The date shown is not the original due date.    Advanced directives: (Declined) Advance directive discussed with you today. Even though you declined this today, please call our office should you change your mind, and we can give you the proper paperwork for you to fill out.  Next Medicare Annual Wellness Visit scheduled for next year: No, will make next year  Insert Preventive Care attachment Insert FALL PREVENTION attachment if needed

## 2023-11-13 NOTE — Telephone Encounter (Signed)
-----   Message from Melida Quitter sent at 11/13/2023  2:07 PM EST ----- Regarding: FW: coumadin and potassium Please let Tyyana know that she can increase her potassium dose.  She should take 2 tablets in the morning and continue with 1 tablet for the other 2 doses during the day. ----- Message ----- From: Barb Merino, LPN Sent: 28/41/3244   1:53 PM EST To: Melida Quitter, PA Subject: RE: coumadin and potassium                     Yes ma'am. She said she is. ----- Message ----- From: Melida Quitter, PA Sent: 11/13/2023   1:49 PM EST To: Barb Merino, LPN Subject: FW: coumadin and potassium                     Thank you. Is she already taking the potassium supplement as prescribed? ----- Message ----- From: Barb Merino, LPN Sent: 12/06/7251  12:19 PM EST To: Melida Quitter, PA Subject: coumadin and potassium                         Hello Lequita Halt, I just finished an AWV with Ms. Heinzerling. She said that her coumadin orders are wrong. She says she is to take 3 mg Mondays and Fridays and takes 2 mg the other days. It says 3 Mg daily. Also she wants to know if she should have the potassium increased since it was low. Thank you

## 2023-11-19 ENCOUNTER — Ambulatory Visit (HOSPITAL_BASED_OUTPATIENT_CLINIC_OR_DEPARTMENT_OTHER): Payer: HMO | Admitting: General Surgery

## 2023-11-19 LAB — POCT INR: INR: 2.8 — AB (ref 0.80–1.20)

## 2023-11-22 ENCOUNTER — Other Ambulatory Visit (HOSPITAL_COMMUNITY): Payer: Self-pay

## 2023-12-03 DIAGNOSIS — R339 Retention of urine, unspecified: Secondary | ICD-10-CM | POA: Diagnosis not present

## 2023-12-04 DIAGNOSIS — R339 Retention of urine, unspecified: Secondary | ICD-10-CM | POA: Diagnosis not present

## 2023-12-06 ENCOUNTER — Other Ambulatory Visit (HOSPITAL_COMMUNITY): Payer: Self-pay

## 2023-12-06 ENCOUNTER — Other Ambulatory Visit: Payer: Self-pay

## 2023-12-06 ENCOUNTER — Other Ambulatory Visit (HOSPITAL_BASED_OUTPATIENT_CLINIC_OR_DEPARTMENT_OTHER): Payer: Self-pay

## 2023-12-06 ENCOUNTER — Other Ambulatory Visit: Payer: Self-pay | Admitting: Family Medicine

## 2023-12-06 DIAGNOSIS — G8250 Quadriplegia, unspecified: Secondary | ICD-10-CM

## 2023-12-06 MED ORDER — DIAZEPAM 5 MG PO TABS
5.0000 mg | ORAL_TABLET | Freq: Every day | ORAL | 1 refills | Status: DC | PRN
  Filled 2023-12-06: qty 90, 90d supply, fill #0
  Filled 2024-02-26 – 2024-03-03 (×3): qty 90, 90d supply, fill #1

## 2023-12-06 MED ORDER — WARFARIN SODIUM 2 MG PO TABS
ORAL_TABLET | ORAL | 1 refills | Status: DC
  Filled 2023-12-06: qty 72, 100d supply, fill #0
  Filled 2024-03-17: qty 72, 100d supply, fill #1

## 2023-12-06 NOTE — Addendum Note (Signed)
 Addended by: Saralyn Pilar on: 12/06/2023 09:54 AM   Modules accepted: Orders

## 2023-12-06 NOTE — Telephone Encounter (Signed)
 Copied from CRM [id number]. Topic: Clinical - Prescription Issue >> Dec 06, 2023  8:41 AM Bascom RAMAN wrote: Reason for CRM: Patient states she has an issue with a prescription that was messed up, and wants to speak with someone regarding it. Patient's callback number is [phone].     Returned pt call she have concerns about her COUMADIN  she stated that the Rx that was order was all wrong and she need to speak to the provider

## 2023-12-06 NOTE — Telephone Encounter (Signed)
 Reordered Coumadin prescription.  Current routine should be: -Coumadin 2 mg every Tuesday, Wednesday, Thursday, Saturday, Sunday -Coumadin 3 mg every Monday and Friday  This should be updated in her chart now.

## 2023-12-06 NOTE — Progress Notes (Signed)
 Reordering Coumadin prescription.  Current routine should be: -Coumadin 2 mg every Tuesday, Wednesday, Thursday, Saturday, Sunday -Coumadin 3 mg every Monday and Friday

## 2023-12-10 LAB — POCT INR: INR: 2.2 — AB (ref 0.80–1.20)

## 2023-12-10 NOTE — Telephone Encounter (Signed)
 Copied from CRM [id number]. Topic: General - Other >> Dec 10, 2023  3:04 PM Donita Brooks wrote: Reason for CRM: Pt is reporting their INR today- 2.2 Make sure on 12/16 - 2.8

## 2023-12-10 NOTE — Telephone Encounter (Signed)
 Perfect, INR results have been abstracted.

## 2023-12-24 ENCOUNTER — Encounter: Payer: Self-pay | Admitting: Family Medicine

## 2023-12-24 ENCOUNTER — Telehealth: Payer: Self-pay | Admitting: *Deleted

## 2023-12-24 LAB — POCT INR: INR: 2.1 — AB (ref 0.80–1.20)

## 2023-12-24 NOTE — Telephone Encounter (Signed)
Copied from CRM [id number]. Topic: Clinical - Medical Advice >> Dec 24, 2023  4:52 PM Carloyn Manner C wrote: Reason for CRM: Patient called in to report her INR and stated that is was 2.1 please give patient a call back if needed.

## 2023-12-25 NOTE — Telephone Encounter (Signed)
INR has been added to her chart at 2.1.  This is within the goal of 2.0-3.0.  If INR drops below 2.0, increase total weekly dose of warfarin by ~10%.  May also consider referring to hematology management of warfarin given recent fluctuations.

## 2024-01-01 DIAGNOSIS — M85872 Other specified disorders of bone density and structure, left ankle and foot: Secondary | ICD-10-CM | POA: Diagnosis not present

## 2024-01-01 DIAGNOSIS — G8250 Quadriplegia, unspecified: Secondary | ICD-10-CM | POA: Diagnosis not present

## 2024-01-01 DIAGNOSIS — L89624 Pressure ulcer of left heel, stage 4: Secondary | ICD-10-CM | POA: Diagnosis not present

## 2024-01-01 DIAGNOSIS — L98496 Non-pressure chronic ulcer of skin of other sites with bone involvement without evidence of necrosis: Secondary | ICD-10-CM | POA: Diagnosis not present

## 2024-01-01 DIAGNOSIS — M19072 Primary osteoarthritis, left ankle and foot: Secondary | ICD-10-CM | POA: Diagnosis not present

## 2024-01-01 DIAGNOSIS — Z993 Dependence on wheelchair: Secondary | ICD-10-CM | POA: Diagnosis not present

## 2024-01-01 DIAGNOSIS — L89520 Pressure ulcer of left ankle, unstageable: Secondary | ICD-10-CM | POA: Diagnosis not present

## 2024-01-01 DIAGNOSIS — R609 Edema, unspecified: Secondary | ICD-10-CM | POA: Diagnosis not present

## 2024-01-02 ENCOUNTER — Other Ambulatory Visit: Payer: Self-pay

## 2024-01-02 ENCOUNTER — Other Ambulatory Visit (HOSPITAL_COMMUNITY): Payer: Self-pay

## 2024-01-02 DIAGNOSIS — S81002A Unspecified open wound, left knee, initial encounter: Secondary | ICD-10-CM | POA: Diagnosis not present

## 2024-01-02 DIAGNOSIS — R339 Retention of urine, unspecified: Secondary | ICD-10-CM | POA: Diagnosis not present

## 2024-01-03 ENCOUNTER — Other Ambulatory Visit: Payer: Self-pay

## 2024-01-03 ENCOUNTER — Telehealth: Payer: Self-pay | Admitting: *Deleted

## 2024-01-03 DIAGNOSIS — R339 Retention of urine, unspecified: Secondary | ICD-10-CM | POA: Diagnosis not present

## 2024-01-03 NOTE — Telephone Encounter (Signed)
Routing to PCP to see what needs to be done, pt would like to have someone drop off a urine for culture to check for UTI.  Informed her that we are going away from that without an appointment and told her I would let her know in the morning if she would allow her to do this.

## 2024-01-03 NOTE — Telephone Encounter (Signed)
Copied from CRM [id number]. Topic: Clinical - Request for Lab/Test Order >> Jan 03, 2024  3:41 PM Hector Shade B wrote: Reason for CRM: Patient stated she had a urine culture that needed to be dropped and wanted to know if she needed any paperwork for the culture Please call patient at [phone],

## 2024-01-04 NOTE — Telephone Encounter (Signed)
Informed pt of below and I asked if she wanted to schedule an appointment and she said no that she would find another way.

## 2024-01-04 NOTE — Telephone Encounter (Signed)
We are going away from this, so she will need to schedule appointment with our office, with another primary care office, or with an urgent care in order to be evaluated and have a urinalysis and/or urine culture run

## 2024-01-05 DIAGNOSIS — N3001 Acute cystitis with hematuria: Secondary | ICD-10-CM | POA: Diagnosis not present

## 2024-01-05 DIAGNOSIS — N3091 Cystitis, unspecified with hematuria: Secondary | ICD-10-CM | POA: Diagnosis not present

## 2024-01-07 ENCOUNTER — Other Ambulatory Visit: Payer: Self-pay

## 2024-01-07 ENCOUNTER — Other Ambulatory Visit (HOSPITAL_BASED_OUTPATIENT_CLINIC_OR_DEPARTMENT_OTHER): Payer: Self-pay

## 2024-01-07 ENCOUNTER — Other Ambulatory Visit (HOSPITAL_COMMUNITY): Payer: Self-pay

## 2024-01-07 MED ORDER — CEPHALEXIN 500 MG PO CAPS
500.0000 mg | ORAL_CAPSULE | Freq: Four times a day (QID) | ORAL | 0 refills | Status: AC
  Filled 2024-01-07 (×3): qty 56, 14d supply, fill #0

## 2024-01-08 ENCOUNTER — Other Ambulatory Visit (HOSPITAL_COMMUNITY): Payer: Self-pay

## 2024-01-08 DIAGNOSIS — L89624 Pressure ulcer of left heel, stage 4: Secondary | ICD-10-CM | POA: Diagnosis not present

## 2024-01-08 DIAGNOSIS — L98495 Non-pressure chronic ulcer of skin of other sites with muscle involvement without evidence of necrosis: Secondary | ICD-10-CM | POA: Diagnosis not present

## 2024-01-08 DIAGNOSIS — G8250 Quadriplegia, unspecified: Secondary | ICD-10-CM | POA: Diagnosis not present

## 2024-01-08 DIAGNOSIS — S81002D Unspecified open wound, left knee, subsequent encounter: Secondary | ICD-10-CM | POA: Diagnosis not present

## 2024-01-08 DIAGNOSIS — A4901 Methicillin susceptible Staphylococcus aureus infection, unspecified site: Secondary | ICD-10-CM | POA: Diagnosis not present

## 2024-01-08 DIAGNOSIS — I96 Gangrene, not elsewhere classified: Secondary | ICD-10-CM | POA: Diagnosis not present

## 2024-01-08 DIAGNOSIS — L89524 Pressure ulcer of left ankle, stage 4: Secondary | ICD-10-CM | POA: Diagnosis not present

## 2024-01-09 ENCOUNTER — Other Ambulatory Visit: Payer: Self-pay

## 2024-01-09 ENCOUNTER — Other Ambulatory Visit (HOSPITAL_COMMUNITY): Payer: Self-pay

## 2024-01-10 ENCOUNTER — Encounter: Payer: Self-pay | Admitting: Family Medicine

## 2024-01-10 ENCOUNTER — Other Ambulatory Visit (HOSPITAL_COMMUNITY): Payer: Self-pay

## 2024-01-14 DIAGNOSIS — R339 Retention of urine, unspecified: Secondary | ICD-10-CM | POA: Diagnosis not present

## 2024-01-15 ENCOUNTER — Other Ambulatory Visit (HOSPITAL_COMMUNITY): Payer: Self-pay

## 2024-01-15 DIAGNOSIS — L98496 Non-pressure chronic ulcer of skin of other sites with bone involvement without evidence of necrosis: Secondary | ICD-10-CM | POA: Diagnosis not present

## 2024-01-15 DIAGNOSIS — L089 Local infection of the skin and subcutaneous tissue, unspecified: Secondary | ICD-10-CM | POA: Diagnosis not present

## 2024-01-15 DIAGNOSIS — G8250 Quadriplegia, unspecified: Secondary | ICD-10-CM | POA: Diagnosis not present

## 2024-01-15 DIAGNOSIS — T148XXA Other injury of unspecified body region, initial encounter: Secondary | ICD-10-CM | POA: Diagnosis not present

## 2024-01-15 MED ORDER — CIPROFLOXACIN HCL 500 MG PO TABS
500.0000 mg | ORAL_TABLET | Freq: Two times a day (BID) | ORAL | 0 refills | Status: DC
  Filled 2024-01-15: qty 28, 14d supply, fill #0

## 2024-01-16 ENCOUNTER — Other Ambulatory Visit (HOSPITAL_COMMUNITY): Payer: Self-pay

## 2024-01-16 ENCOUNTER — Other Ambulatory Visit: Payer: Self-pay

## 2024-01-16 MED ORDER — SANTYL 250 UNIT/GM EX OINT
1.0000 | TOPICAL_OINTMENT | Freq: Every day | CUTANEOUS | 2 refills | Status: AC
Start: 2024-01-16 — End: ?
  Filled 2024-01-16 – 2024-01-18 (×2): qty 30, 30d supply, fill #0

## 2024-01-18 ENCOUNTER — Other Ambulatory Visit (HOSPITAL_COMMUNITY): Payer: Self-pay

## 2024-01-18 ENCOUNTER — Other Ambulatory Visit: Payer: Self-pay

## 2024-01-19 DIAGNOSIS — L89520 Pressure ulcer of left ankle, unstageable: Secondary | ICD-10-CM | POA: Diagnosis not present

## 2024-01-19 DIAGNOSIS — S86012A Strain of left Achilles tendon, initial encounter: Secondary | ICD-10-CM | POA: Diagnosis not present

## 2024-01-19 DIAGNOSIS — M25472 Effusion, left ankle: Secondary | ICD-10-CM | POA: Diagnosis not present

## 2024-01-19 DIAGNOSIS — A4901 Methicillin susceptible Staphylococcus aureus infection, unspecified site: Secondary | ICD-10-CM | POA: Diagnosis not present

## 2024-01-19 DIAGNOSIS — G8250 Quadriplegia, unspecified: Secondary | ICD-10-CM | POA: Diagnosis not present

## 2024-01-19 DIAGNOSIS — L97529 Non-pressure chronic ulcer of other part of left foot with unspecified severity: Secondary | ICD-10-CM | POA: Diagnosis not present

## 2024-01-19 DIAGNOSIS — S86012D Strain of left Achilles tendon, subsequent encounter: Secondary | ICD-10-CM | POA: Diagnosis not present

## 2024-01-19 DIAGNOSIS — L98496 Non-pressure chronic ulcer of skin of other sites with bone involvement without evidence of necrosis: Secondary | ICD-10-CM | POA: Diagnosis not present

## 2024-01-19 DIAGNOSIS — M86172 Other acute osteomyelitis, left ankle and foot: Secondary | ICD-10-CM | POA: Diagnosis not present

## 2024-01-29 ENCOUNTER — Other Ambulatory Visit (HOSPITAL_COMMUNITY): Payer: Self-pay

## 2024-01-29 ENCOUNTER — Other Ambulatory Visit: Payer: Self-pay

## 2024-01-29 DIAGNOSIS — G8220 Paraplegia, unspecified: Secondary | ICD-10-CM | POA: Diagnosis not present

## 2024-01-29 DIAGNOSIS — M86272 Subacute osteomyelitis, left ankle and foot: Secondary | ICD-10-CM | POA: Diagnosis not present

## 2024-01-29 DIAGNOSIS — L89524 Pressure ulcer of left ankle, stage 4: Secondary | ICD-10-CM | POA: Diagnosis not present

## 2024-01-29 DIAGNOSIS — B9561 Methicillin susceptible Staphylococcus aureus infection as the cause of diseases classified elsewhere: Secondary | ICD-10-CM | POA: Diagnosis not present

## 2024-01-29 DIAGNOSIS — S81002D Unspecified open wound, left knee, subsequent encounter: Secondary | ICD-10-CM | POA: Diagnosis not present

## 2024-01-29 DIAGNOSIS — L89624 Pressure ulcer of left heel, stage 4: Secondary | ICD-10-CM | POA: Diagnosis not present

## 2024-01-29 DIAGNOSIS — B961 Klebsiella pneumoniae [K. pneumoniae] as the cause of diseases classified elsewhere: Secondary | ICD-10-CM | POA: Diagnosis not present

## 2024-01-29 MED ORDER — CIPROFLOXACIN HCL 500 MG PO TABS
500.0000 mg | ORAL_TABLET | Freq: Two times a day (BID) | ORAL | 0 refills | Status: AC
  Filled 2024-01-29: qty 28, 14d supply, fill #0

## 2024-01-29 MED ORDER — CEPHALEXIN 500 MG PO CAPS
500.0000 mg | ORAL_CAPSULE | Freq: Four times a day (QID) | ORAL | 0 refills | Status: AC
  Filled 2024-01-29: qty 56, 14d supply, fill #0

## 2024-02-06 DIAGNOSIS — R339 Retention of urine, unspecified: Secondary | ICD-10-CM | POA: Diagnosis not present

## 2024-02-07 ENCOUNTER — Telehealth: Payer: Self-pay | Admitting: *Deleted

## 2024-02-07 DIAGNOSIS — R339 Retention of urine, unspecified: Secondary | ICD-10-CM | POA: Diagnosis not present

## 2024-02-07 NOTE — Telephone Encounter (Addendum)
 Pt calling to say that she received a letter for jury duty and she needs documentation with her diagnosis as to why she cannot serve. Please create this letter and let patient know when this is ready.  Pt would like this mailed to her.

## 2024-02-08 DIAGNOSIS — R339 Retention of urine, unspecified: Secondary | ICD-10-CM | POA: Diagnosis not present

## 2024-02-08 NOTE — Telephone Encounter (Signed)
 Letter has been printed, signed, and placed in top tray for you to mail.

## 2024-02-08 NOTE — Telephone Encounter (Signed)
 Letter placed in envelope and placed in mail.

## 2024-02-12 ENCOUNTER — Other Ambulatory Visit: Payer: Self-pay

## 2024-02-12 ENCOUNTER — Other Ambulatory Visit (HOSPITAL_BASED_OUTPATIENT_CLINIC_OR_DEPARTMENT_OTHER): Payer: Self-pay

## 2024-02-12 ENCOUNTER — Other Ambulatory Visit (HOSPITAL_COMMUNITY): Payer: Self-pay

## 2024-02-12 ENCOUNTER — Other Ambulatory Visit: Payer: Self-pay | Admitting: Family Medicine

## 2024-02-12 DIAGNOSIS — Z87828 Personal history of other (healed) physical injury and trauma: Secondary | ICD-10-CM | POA: Diagnosis not present

## 2024-02-12 DIAGNOSIS — B379 Candidiasis, unspecified: Secondary | ICD-10-CM

## 2024-02-12 DIAGNOSIS — M79662 Pain in left lower leg: Secondary | ICD-10-CM | POA: Diagnosis not present

## 2024-02-12 DIAGNOSIS — L89624 Pressure ulcer of left heel, stage 4: Secondary | ICD-10-CM | POA: Diagnosis not present

## 2024-02-12 DIAGNOSIS — G8250 Quadriplegia, unspecified: Secondary | ICD-10-CM | POA: Diagnosis not present

## 2024-02-12 DIAGNOSIS — Z79899 Other long term (current) drug therapy: Secondary | ICD-10-CM | POA: Diagnosis not present

## 2024-02-12 DIAGNOSIS — L98496 Non-pressure chronic ulcer of skin of other sites with bone involvement without evidence of necrosis: Secondary | ICD-10-CM | POA: Diagnosis not present

## 2024-02-12 DIAGNOSIS — M79672 Pain in left foot: Secondary | ICD-10-CM | POA: Diagnosis not present

## 2024-02-12 DIAGNOSIS — L89529 Pressure ulcer of left ankle, unspecified stage: Secondary | ICD-10-CM | POA: Diagnosis not present

## 2024-02-12 MED ORDER — FLUCONAZOLE 200 MG PO TABS
200.0000 mg | ORAL_TABLET | Freq: Every day | ORAL | 1 refills | Status: DC
  Filled 2024-02-12: qty 1, 1d supply, fill #0
  Filled 2024-03-17: qty 1, 1d supply, fill #1

## 2024-02-12 NOTE — Telephone Encounter (Signed)
 Can we call this patient and ask her why she wants the fluconazole and what symptoms she has having?

## 2024-02-13 DIAGNOSIS — S81002A Unspecified open wound, left knee, initial encounter: Secondary | ICD-10-CM | POA: Diagnosis not present

## 2024-02-26 ENCOUNTER — Other Ambulatory Visit: Payer: Self-pay | Admitting: Family Medicine

## 2024-02-26 ENCOUNTER — Other Ambulatory Visit: Payer: Self-pay

## 2024-02-26 ENCOUNTER — Other Ambulatory Visit (HOSPITAL_COMMUNITY): Payer: Self-pay

## 2024-02-26 DIAGNOSIS — L89520 Pressure ulcer of left ankle, unstageable: Secondary | ICD-10-CM | POA: Diagnosis not present

## 2024-02-26 DIAGNOSIS — N319 Neuromuscular dysfunction of bladder, unspecified: Secondary | ICD-10-CM

## 2024-02-26 DIAGNOSIS — L98496 Non-pressure chronic ulcer of skin of other sites with bone involvement without evidence of necrosis: Secondary | ICD-10-CM | POA: Diagnosis not present

## 2024-02-26 MED ORDER — OXYBUTYNIN CHLORIDE 5 MG PO TABS
5.0000 mg | ORAL_TABLET | Freq: Two times a day (BID) | ORAL | 1 refills | Status: DC
  Filled 2024-02-26: qty 180, 90d supply, fill #0
  Filled 2024-06-01: qty 180, 90d supply, fill #1

## 2024-02-26 MED ORDER — PRAVASTATIN SODIUM 40 MG PO TABS
40.0000 mg | ORAL_TABLET | Freq: Every day | ORAL | 1 refills | Status: DC
  Filled 2024-02-26: qty 90, 90d supply, fill #0
  Filled 2024-05-17: qty 90, 90d supply, fill #1

## 2024-02-27 ENCOUNTER — Other Ambulatory Visit: Payer: Self-pay

## 2024-02-29 ENCOUNTER — Other Ambulatory Visit (HOSPITAL_COMMUNITY): Payer: Self-pay

## 2024-02-29 ENCOUNTER — Other Ambulatory Visit: Payer: Self-pay

## 2024-03-11 DIAGNOSIS — S81002D Unspecified open wound, left knee, subsequent encounter: Secondary | ICD-10-CM | POA: Diagnosis not present

## 2024-03-11 DIAGNOSIS — B9689 Other specified bacterial agents as the cause of diseases classified elsewhere: Secondary | ICD-10-CM | POA: Diagnosis not present

## 2024-03-11 DIAGNOSIS — L98496 Non-pressure chronic ulcer of skin of other sites with bone involvement without evidence of necrosis: Secondary | ICD-10-CM | POA: Diagnosis not present

## 2024-03-11 DIAGNOSIS — M86172 Other acute osteomyelitis, left ankle and foot: Secondary | ICD-10-CM | POA: Diagnosis not present

## 2024-03-11 DIAGNOSIS — M8668 Other chronic osteomyelitis, other site: Secondary | ICD-10-CM | POA: Diagnosis not present

## 2024-03-11 DIAGNOSIS — N319 Neuromuscular dysfunction of bladder, unspecified: Secondary | ICD-10-CM | POA: Diagnosis not present

## 2024-03-11 DIAGNOSIS — G8250 Quadriplegia, unspecified: Secondary | ICD-10-CM | POA: Diagnosis not present

## 2024-03-11 DIAGNOSIS — L89624 Pressure ulcer of left heel, stage 4: Secondary | ICD-10-CM | POA: Diagnosis not present

## 2024-03-12 ENCOUNTER — Ambulatory Visit: Payer: Self-pay

## 2024-03-12 DIAGNOSIS — R339 Retention of urine, unspecified: Secondary | ICD-10-CM | POA: Diagnosis not present

## 2024-03-12 NOTE — Telephone Encounter (Signed)
 INR today is 1.6  Current Warfarin Dose:  3 mg 2 days a week 2 mg 5 days a week  Pt requesting provider review and advise. CAL notified, provider notified.   Copied from CRM [id number]. Topic: Clinical - Medical Advice >> Mar 12, 2024  4:47 PM Louie Casa B wrote: Reason for EAV:[id number] has questions about  ptinr Reason for Disposition  [1] Follow-up call from patient regarding patient's clinical status AND [2] information urgent  Answer Assessment - Initial Assessment Questions 1. REASON FOR CALL or QUESTION: "What is your reason for calling today?" or "How can I best help you?" or "What question do you have that I can help answer?"     Calling in INR results 2. CALLER: Document the source of call. (e.g., laboratory, patient).     Patient  Protocols used: PCP Call - No Triage-A-AH

## 2024-03-12 NOTE — Telephone Encounter (Signed)
 From what I can tell the patient takes 3 mg on Monday and Friday and 2 mg every other day.  She should take 3 mg for her next dose (tomorrow if she already took today's dose), continue taking 3 mg on Monday and Fridays.  She should recheck the INR on Saturday.  If it is again low, she should take 3 mg on Sunday and then resume her regular dosing of 3 mg on Monday and Friday and 2 mg every other days.  She should wait at least 2 days after adjusting the dose before she rechecks the INR.

## 2024-03-12 NOTE — Telephone Encounter (Signed)
 Contacted pt and informed her of below and she said that she will do this but she thinks she should take 3, 3's in a week and that she thinks that would take care of the highs and lows she feels like it would balance her out. She said that she hasn't been calling to report because she feels like the provider did not know how to do this correctly.

## 2024-03-17 ENCOUNTER — Other Ambulatory Visit: Payer: Self-pay

## 2024-03-20 DIAGNOSIS — R339 Retention of urine, unspecified: Secondary | ICD-10-CM | POA: Diagnosis not present

## 2024-03-25 DIAGNOSIS — G8220 Paraplegia, unspecified: Secondary | ICD-10-CM | POA: Diagnosis not present

## 2024-03-25 DIAGNOSIS — L98496 Non-pressure chronic ulcer of skin of other sites with bone involvement without evidence of necrosis: Secondary | ICD-10-CM | POA: Diagnosis not present

## 2024-03-25 DIAGNOSIS — M86372 Chronic multifocal osteomyelitis, left ankle and foot: Secondary | ICD-10-CM | POA: Diagnosis not present

## 2024-03-26 ENCOUNTER — Other Ambulatory Visit (HOSPITAL_COMMUNITY): Payer: Self-pay

## 2024-03-26 ENCOUNTER — Other Ambulatory Visit: Payer: Self-pay | Admitting: Family Medicine

## 2024-03-26 DIAGNOSIS — N319 Neuromuscular dysfunction of bladder, unspecified: Secondary | ICD-10-CM

## 2024-03-26 MED ORDER — BACLOFEN 20 MG PO TABS
20.0000 mg | ORAL_TABLET | Freq: Two times a day (BID) | ORAL | 1 refills | Status: DC
  Filled 2024-03-26: qty 180, 90d supply, fill #0
  Filled 2024-07-01: qty 180, 90d supply, fill #1

## 2024-03-27 ENCOUNTER — Other Ambulatory Visit: Payer: Self-pay

## 2024-04-08 DIAGNOSIS — M86672 Other chronic osteomyelitis, left ankle and foot: Secondary | ICD-10-CM | POA: Diagnosis not present

## 2024-04-08 DIAGNOSIS — Z79899 Other long term (current) drug therapy: Secondary | ICD-10-CM | POA: Diagnosis not present

## 2024-04-08 DIAGNOSIS — S81002A Unspecified open wound, left knee, initial encounter: Secondary | ICD-10-CM | POA: Diagnosis not present

## 2024-04-08 DIAGNOSIS — L98496 Non-pressure chronic ulcer of skin of other sites with bone involvement without evidence of necrosis: Secondary | ICD-10-CM | POA: Diagnosis not present

## 2024-04-08 DIAGNOSIS — G8250 Quadriplegia, unspecified: Secondary | ICD-10-CM | POA: Diagnosis not present

## 2024-04-08 DIAGNOSIS — G8220 Paraplegia, unspecified: Secondary | ICD-10-CM | POA: Diagnosis not present

## 2024-04-08 DIAGNOSIS — M86272 Subacute osteomyelitis, left ankle and foot: Secondary | ICD-10-CM | POA: Diagnosis not present

## 2024-04-08 DIAGNOSIS — S81002D Unspecified open wound, left knee, subsequent encounter: Secondary | ICD-10-CM | POA: Diagnosis not present

## 2024-04-08 DIAGNOSIS — L97426 Non-pressure chronic ulcer of left heel and midfoot with bone involvement without evidence of necrosis: Secondary | ICD-10-CM | POA: Diagnosis not present

## 2024-04-08 DIAGNOSIS — M8629 Subacute osteomyelitis, multiple sites: Secondary | ICD-10-CM | POA: Diagnosis not present

## 2024-04-14 ENCOUNTER — Encounter: Payer: Self-pay | Admitting: Family Medicine

## 2024-04-14 ENCOUNTER — Ambulatory Visit (INDEPENDENT_AMBULATORY_CARE_PROVIDER_SITE_OTHER): Admitting: Family Medicine

## 2024-04-14 ENCOUNTER — Other Ambulatory Visit (HOSPITAL_COMMUNITY): Payer: Self-pay

## 2024-04-14 ENCOUNTER — Other Ambulatory Visit: Payer: Self-pay

## 2024-04-14 VITALS — BP 105/67 | HR 81 | Ht 64.0 in

## 2024-04-14 DIAGNOSIS — Z7901 Long term (current) use of anticoagulants: Secondary | ICD-10-CM

## 2024-04-14 DIAGNOSIS — G904 Autonomic dysreflexia: Secondary | ICD-10-CM | POA: Diagnosis not present

## 2024-04-14 DIAGNOSIS — G8250 Quadriplegia, unspecified: Secondary | ICD-10-CM | POA: Diagnosis not present

## 2024-04-14 DIAGNOSIS — G894 Chronic pain syndrome: Secondary | ICD-10-CM

## 2024-04-14 DIAGNOSIS — Z5181 Encounter for therapeutic drug level monitoring: Secondary | ICD-10-CM

## 2024-04-14 DIAGNOSIS — F418 Other specified anxiety disorders: Secondary | ICD-10-CM

## 2024-04-14 MED ORDER — DIAZEPAM 5 MG PO TABS
5.0000 mg | ORAL_TABLET | Freq: Two times a day (BID) | ORAL | 3 refills | Status: DC | PRN
  Filled 2024-04-14 – 2024-05-30 (×5): qty 60, 30d supply, fill #0
  Filled 2024-07-22: qty 60, 30d supply, fill #1
  Filled 2024-09-29: qty 60, 30d supply, fill #2

## 2024-04-14 MED ORDER — WARFARIN SODIUM 2 MG PO TABS: ORAL_TABLET | ORAL | Status: DC

## 2024-04-14 MED ORDER — ACETAMINOPHEN-CODEINE 300-30 MG PO TABS
1.0000 | ORAL_TABLET | ORAL | 1 refills | Status: AC | PRN
  Filled 2024-04-14: qty 30, 3d supply, fill #0

## 2024-04-14 MED ORDER — WARFARIN SODIUM 3 MG PO TABS: ORAL_TABLET | ORAL | Status: DC

## 2024-04-14 NOTE — Assessment & Plan Note (Signed)
 Sending in bid prn dosing of diazepam  5mg  for muscle spasms.  Uses motorized wheechair.  Sees orthopedics and wound care.

## 2024-04-14 NOTE — Assessment & Plan Note (Signed)
 Sending in tylenol  #3 for her to use prn.  Uses rarely.  Did not use last years medication but wants yearly refill out of concern for using expired medication

## 2024-04-14 NOTE — Assessment & Plan Note (Signed)
 Refill as needed to help with anxiety associated with riding in vehicle.  0.5mg  aplrazolam.

## 2024-04-14 NOTE — Patient Instructions (Addendum)
 It was nice to see you today,  We addressed the following topics today: - I have changed your warfarin dosing so that you will take 3mg  on Monday Wednesday and Friday  Have a great day,  Etha Henle, MD

## 2024-04-14 NOTE — Assessment & Plan Note (Signed)
-   Monitor: INR currently 2-3 on alternating dosing schedule - continue 3mg  MWF, 2mg  all other days.

## 2024-04-14 NOTE — Progress Notes (Signed)
 Established Patient Office Visit  Subjective   Patient ID: [name], female    DOB: 17-Apr-1956  Age: 68 y.o. MRN: [id number]  Chief Complaint  Patient presents with   Medication Management    HPI  Subjective - Warfarin follow up: Reports INR 2.0 when taking 3 mg on Monday, Wednesday, Friday and 2 mg other days. INR drops to 1.6-1.7 when only taking 2 mg. Notes INR increases with antibiotics and cranberry consumption. - Diazepam  clarification: Takes for muscle spasms, not anxiety as listed. Prescribed for twice daily use but tries to take once daily. Reports difficulty breathing when lying flat, needed for MRI procedure. - Alprazolam : Takes for panic attacks in vehicle, especially when using wheelchair lift. Reports needing to increase from half to whole pill when sister drives. - Heel infection: Following with Dr. Broadus Canes at [facility] for severe heel infection possibly extending to bone. Awaiting  intervention from orthopedic surgeon Dr. Murrell Arrant [facility]) who previously placed rod in foot.  - Breathing difficulty: Reports inability to breathe when lying flat, exacerbated during recent MRI. - Mobility concerns: Uses Easy Pivot lift for transfers. Reports previous wounds from [place] lift use. Concerned about potential amputation suggestion from orthopedic surgeon as would impact mobility.  Medications: warfarin 3 mg Monday/Wednesday/Friday and 2 mg other days (anticoagulation), diazepam  for muscle spasms, alprazolam  for panic attacks, Tylenol  #3 for hyperreflexia symptoms (reports not using recently), thyroid  medication, antibiotics for heel infection  PMH: spinal cord injury with hyperreflexia, suprapubic catheter, recurrent UTIs, acid reflux, fibrocystic breast disease, history of leg fracture with rod placement, recurrent heel infections, thyroid  disorder  ROS: Reports difficulty breathing when lying flat, head tingling with hyperreflexia episodes, pain  with hyperreflexia described as "shooting up spinal cord and out top of head"   The 10-year ASCVD risk score (Arnett DK, et al., 2019) is: 6%  Health Maintenance Due  Topic Date Due   MAMMOGRAM  Never done   Zoster Vaccines- Shingrix (1 of 2) 02/11/2005   DEXA SCAN  Never done   DTaP/Tdap/Td (2 - Td or Tdap) 12/04/2020   COVID-19 Vaccine (3 - 2024-25 season) 08/05/2023      Objective:     BP 105/67   Pulse 81   Ht 5\' 4"  (1.626 m)   SpO2 95%   BMI 30.04 kg/m    Physical Exam Gen: alert, oriented.  In motorized wheechair Cv: rrr Pulm: lctab Msk: contracture of the upper extremities.  Limited upper body movement   No results found for any visits on 04/14/24.      Assessment & Plan:   Monitoring for long-term anticoagulant use Assessment & Plan: - Monitor: INR currently 2-3 on alternating dosing schedule - continue 3mg  MWF, 2mg  all other days.     Tetraplegia [facility]) Assessment & Plan: Sending in bid prn dosing of diazepam  5mg  for muscle spasms.  Uses motorized wheechair.  Sees orthopedics and wound care.    Orders: -     Warfarin Sodium ; Take 1 tablet by mouth every Tuesday, Thursday, Saturday, and Sunday. -     diazePAM ; Take 1 tablet (5 mg total) by mouth 2 (two) times daily as needed (for breakthrough anxiety).  Dispense: 60 tablet; Refill: 3  Autonomic dysreflexia Assessment & Plan: Sending in tylenol  #3 for her to use prn.  Uses rarely.  Did not use last years medication but wants yearly refill out of concern for using expired medication  Orders: -     Acetaminophen -Codeine ; Take  1-2 tablets by mouth every 4 (four) hours as needed for moderate pain (pain score 4-6).  Dispense: 30 tablet; Refill: 1  Chronic pain syndrome -     Acetaminophen -Codeine ; Take 1-2 tablets by mouth every 4 (four) hours as needed for moderate pain (pain score 4-6).  Dispense: 30 tablet; Refill: 1  Situational anxiety Assessment & Plan: Refill as needed to help with anxiety  associated with riding in vehicle.  0.5mg  aplrazolam.     Other orders -     Warfarin Sodium ; Every Monday wednesday Friday   I spent 35 min in the mgmt of this patient.   Return in about 6 months (around 10/15/2024) for physical.    Laneta Pintos, MD

## 2024-04-17 ENCOUNTER — Other Ambulatory Visit: Payer: Self-pay | Admitting: Family Medicine

## 2024-04-18 ENCOUNTER — Other Ambulatory Visit (HOSPITAL_COMMUNITY): Payer: Self-pay

## 2024-04-18 ENCOUNTER — Other Ambulatory Visit: Payer: Self-pay

## 2024-04-18 MED ORDER — BUMETANIDE 1 MG PO TABS
1.0000 mg | ORAL_TABLET | Freq: Every day | ORAL | 1 refills | Status: DC
  Filled 2024-04-18: qty 100, 100d supply, fill #0
  Filled 2024-07-22: qty 100, 100d supply, fill #1

## 2024-04-18 NOTE — Telephone Encounter (Signed)
 Pt states she is out of this medication, also it was routed to [place] who is no longer at practice. Please advise

## 2024-04-21 DIAGNOSIS — R339 Retention of urine, unspecified: Secondary | ICD-10-CM | POA: Diagnosis not present

## 2024-04-22 DIAGNOSIS — L98496 Non-pressure chronic ulcer of skin of other sites with bone involvement without evidence of necrosis: Secondary | ICD-10-CM | POA: Diagnosis not present

## 2024-04-24 DIAGNOSIS — R339 Retention of urine, unspecified: Secondary | ICD-10-CM | POA: Diagnosis not present

## 2024-04-29 DIAGNOSIS — L98496 Non-pressure chronic ulcer of skin of other sites with bone involvement without evidence of necrosis: Secondary | ICD-10-CM | POA: Diagnosis not present

## 2024-05-02 ENCOUNTER — Other Ambulatory Visit: Payer: Self-pay

## 2024-05-02 ENCOUNTER — Other Ambulatory Visit (HOSPITAL_COMMUNITY): Payer: Self-pay

## 2024-05-02 MED ORDER — CIPROFLOXACIN HCL 500 MG PO TABS
500.0000 mg | ORAL_TABLET | Freq: Two times a day (BID) | ORAL | 0 refills | Status: DC
  Filled 2024-05-02: qty 84, 42d supply, fill #0

## 2024-05-06 DIAGNOSIS — L97426 Non-pressure chronic ulcer of left heel and midfoot with bone involvement without evidence of necrosis: Secondary | ICD-10-CM | POA: Diagnosis not present

## 2024-05-06 DIAGNOSIS — L89624 Pressure ulcer of left heel, stage 4: Secondary | ICD-10-CM | POA: Diagnosis not present

## 2024-05-06 DIAGNOSIS — S81002D Unspecified open wound, left knee, subsequent encounter: Secondary | ICD-10-CM | POA: Diagnosis not present

## 2024-05-06 DIAGNOSIS — L98496 Non-pressure chronic ulcer of skin of other sites with bone involvement without evidence of necrosis: Secondary | ICD-10-CM | POA: Diagnosis not present

## 2024-05-06 DIAGNOSIS — L97822 Non-pressure chronic ulcer of other part of left lower leg with fat layer exposed: Secondary | ICD-10-CM | POA: Diagnosis not present

## 2024-05-06 DIAGNOSIS — M86272 Subacute osteomyelitis, left ankle and foot: Secondary | ICD-10-CM | POA: Diagnosis not present

## 2024-05-06 DIAGNOSIS — G8250 Quadriplegia, unspecified: Secondary | ICD-10-CM | POA: Diagnosis not present

## 2024-05-06 DIAGNOSIS — Z79899 Other long term (current) drug therapy: Secondary | ICD-10-CM | POA: Diagnosis not present

## 2024-05-06 DIAGNOSIS — M869 Osteomyelitis, unspecified: Secondary | ICD-10-CM | POA: Diagnosis not present

## 2024-05-06 DIAGNOSIS — I739 Peripheral vascular disease, unspecified: Secondary | ICD-10-CM | POA: Diagnosis not present

## 2024-05-06 DIAGNOSIS — M86672 Other chronic osteomyelitis, left ankle and foot: Secondary | ICD-10-CM | POA: Diagnosis not present

## 2024-05-07 DIAGNOSIS — M86172 Other acute osteomyelitis, left ankle and foot: Secondary | ICD-10-CM | POA: Diagnosis not present

## 2024-05-12 ENCOUNTER — Telehealth: Payer: Self-pay | Admitting: *Deleted

## 2024-05-12 NOTE — Telephone Encounter (Signed)
 Pt calling wanting to have orders for her INR to be drawn with labs she is getting every Monday.  Called Felicita Horns, RN  [phone] with [facility] the verbal to collect this when she draws labs.  Formal document to follow for provider to sign.

## 2024-05-13 DIAGNOSIS — S81002D Unspecified open wound, left knee, subsequent encounter: Secondary | ICD-10-CM | POA: Diagnosis not present

## 2024-05-13 DIAGNOSIS — Z79899 Other long term (current) drug therapy: Secondary | ICD-10-CM | POA: Diagnosis not present

## 2024-05-13 DIAGNOSIS — S81002A Unspecified open wound, left knee, initial encounter: Secondary | ICD-10-CM | POA: Diagnosis not present

## 2024-05-13 DIAGNOSIS — M86672 Other chronic osteomyelitis, left ankle and foot: Secondary | ICD-10-CM | POA: Diagnosis not present

## 2024-05-13 DIAGNOSIS — M86172 Other acute osteomyelitis, left ankle and foot: Secondary | ICD-10-CM | POA: Diagnosis not present

## 2024-05-13 DIAGNOSIS — L97426 Non-pressure chronic ulcer of left heel and midfoot with bone involvement without evidence of necrosis: Secondary | ICD-10-CM | POA: Diagnosis not present

## 2024-05-13 DIAGNOSIS — L98496 Non-pressure chronic ulcer of skin of other sites with bone involvement without evidence of necrosis: Secondary | ICD-10-CM | POA: Diagnosis not present

## 2024-05-15 DIAGNOSIS — M86172 Other acute osteomyelitis, left ankle and foot: Secondary | ICD-10-CM | POA: Diagnosis not present

## 2024-05-19 ENCOUNTER — Other Ambulatory Visit: Payer: Self-pay

## 2024-05-19 ENCOUNTER — Other Ambulatory Visit (HOSPITAL_COMMUNITY): Payer: Self-pay

## 2024-05-19 ENCOUNTER — Telehealth: Payer: Self-pay

## 2024-05-19 NOTE — Telephone Encounter (Signed)
 Copied from CRM [id number]. Topic: Clinical - Medication Question >> May 19, 2024 11:26 AM Marissa P wrote: Reason for CRM: Patient called in regards to the medication: xanax  requesting to get this filled and a call back from the nurse please in regards to this, offered to contact cal she started yelling at me and hung up while I was speaking.

## 2024-05-20 ENCOUNTER — Other Ambulatory Visit: Payer: Self-pay

## 2024-05-21 ENCOUNTER — Other Ambulatory Visit: Payer: Self-pay | Admitting: Family Medicine

## 2024-05-21 ENCOUNTER — Other Ambulatory Visit (HOSPITAL_COMMUNITY): Payer: Self-pay

## 2024-05-21 MED ORDER — ALPRAZOLAM 0.5 MG PO TABS: ORAL_TABLET | ORAL | 0 refills | Status: DC

## 2024-05-21 NOTE — Telephone Encounter (Signed)
 Please change pharmacy: [facility] - [facility]

## 2024-05-21 NOTE — Telephone Encounter (Signed)
 [facility]ill [facility]

## 2024-05-22 DIAGNOSIS — M86172 Other acute osteomyelitis, left ankle and foot: Secondary | ICD-10-CM | POA: Diagnosis not present

## 2024-05-23 ENCOUNTER — Other Ambulatory Visit (HOSPITAL_COMMUNITY): Payer: Self-pay

## 2024-05-23 ENCOUNTER — Other Ambulatory Visit: Payer: Self-pay | Admitting: Family Medicine

## 2024-05-23 ENCOUNTER — Other Ambulatory Visit: Payer: Self-pay

## 2024-05-23 MED ORDER — ALPRAZOLAM 0.5 MG PO TABS
ORAL_TABLET | ORAL | 0 refills | Status: AC
  Filled 2024-05-23: qty 30, 60d supply, fill #0

## 2024-05-23 NOTE — Telephone Encounter (Signed)
 It has been resent in to [facility]

## 2024-05-24 DIAGNOSIS — M86172 Other acute osteomyelitis, left ankle and foot: Secondary | ICD-10-CM | POA: Diagnosis not present

## 2024-05-26 DIAGNOSIS — M86172 Other acute osteomyelitis, left ankle and foot: Secondary | ICD-10-CM | POA: Diagnosis not present

## 2024-05-27 DIAGNOSIS — G894 Chronic pain syndrome: Secondary | ICD-10-CM | POA: Diagnosis not present

## 2024-05-27 DIAGNOSIS — R339 Retention of urine, unspecified: Secondary | ICD-10-CM | POA: Diagnosis not present

## 2024-05-27 DIAGNOSIS — M86172 Other acute osteomyelitis, left ankle and foot: Secondary | ICD-10-CM | POA: Diagnosis not present

## 2024-05-27 DIAGNOSIS — G8250 Quadriplegia, unspecified: Secondary | ICD-10-CM | POA: Diagnosis not present

## 2024-05-28 DIAGNOSIS — R339 Retention of urine, unspecified: Secondary | ICD-10-CM | POA: Diagnosis not present

## 2024-05-30 ENCOUNTER — Other Ambulatory Visit (HOSPITAL_COMMUNITY): Payer: Self-pay

## 2024-05-30 ENCOUNTER — Other Ambulatory Visit: Payer: Self-pay

## 2024-05-30 DIAGNOSIS — M86172 Other acute osteomyelitis, left ankle and foot: Secondary | ICD-10-CM | POA: Diagnosis not present

## 2024-06-02 ENCOUNTER — Other Ambulatory Visit (HOSPITAL_COMMUNITY): Payer: Self-pay

## 2024-06-06 DIAGNOSIS — M86172 Other acute osteomyelitis, left ankle and foot: Secondary | ICD-10-CM | POA: Diagnosis not present

## 2024-06-11 DIAGNOSIS — M86272 Subacute osteomyelitis, left ankle and foot: Secondary | ICD-10-CM | POA: Diagnosis not present

## 2024-06-14 DIAGNOSIS — M86172 Other acute osteomyelitis, left ankle and foot: Secondary | ICD-10-CM | POA: Diagnosis not present

## 2024-06-17 ENCOUNTER — Other Ambulatory Visit (HOSPITAL_COMMUNITY): Payer: Self-pay

## 2024-06-17 ENCOUNTER — Other Ambulatory Visit: Payer: Self-pay | Admitting: Family Medicine

## 2024-06-17 ENCOUNTER — Other Ambulatory Visit: Payer: Self-pay

## 2024-06-17 DIAGNOSIS — M86172 Other acute osteomyelitis, left ankle and foot: Secondary | ICD-10-CM | POA: Diagnosis not present

## 2024-06-17 DIAGNOSIS — B379 Candidiasis, unspecified: Secondary | ICD-10-CM

## 2024-06-17 MED ORDER — FLUCONAZOLE 200 MG PO TABS
200.0000 mg | ORAL_TABLET | Freq: Every day | ORAL | 1 refills | Status: DC
  Filled 2024-06-17: qty 1, 1d supply, fill #0
  Filled 2024-07-22: qty 1, 1d supply, fill #1

## 2024-06-18 DIAGNOSIS — G8250 Quadriplegia, unspecified: Secondary | ICD-10-CM | POA: Diagnosis not present

## 2024-06-18 DIAGNOSIS — G894 Chronic pain syndrome: Secondary | ICD-10-CM | POA: Diagnosis not present

## 2024-06-24 DIAGNOSIS — M86172 Other acute osteomyelitis, left ankle and foot: Secondary | ICD-10-CM | POA: Diagnosis not present

## 2024-06-24 DIAGNOSIS — G8250 Quadriplegia, unspecified: Secondary | ICD-10-CM | POA: Diagnosis not present

## 2024-06-24 DIAGNOSIS — S81002D Unspecified open wound, left knee, subsequent encounter: Secondary | ICD-10-CM | POA: Diagnosis not present

## 2024-06-24 DIAGNOSIS — L98496 Non-pressure chronic ulcer of skin of other sites with bone involvement without evidence of necrosis: Secondary | ICD-10-CM | POA: Diagnosis not present

## 2024-06-24 DIAGNOSIS — B372 Candidiasis of skin and nail: Secondary | ICD-10-CM | POA: Diagnosis not present

## 2024-06-24 DIAGNOSIS — Z8739 Personal history of other diseases of the musculoskeletal system and connective tissue: Secondary | ICD-10-CM | POA: Diagnosis not present

## 2024-06-24 DIAGNOSIS — L89624 Pressure ulcer of left heel, stage 4: Secondary | ICD-10-CM | POA: Diagnosis not present

## 2024-07-01 ENCOUNTER — Other Ambulatory Visit (HOSPITAL_BASED_OUTPATIENT_CLINIC_OR_DEPARTMENT_OTHER): Payer: Self-pay

## 2024-07-01 ENCOUNTER — Other Ambulatory Visit (HOSPITAL_COMMUNITY): Payer: Self-pay

## 2024-07-01 ENCOUNTER — Other Ambulatory Visit: Payer: Self-pay | Admitting: Family Medicine

## 2024-07-01 ENCOUNTER — Other Ambulatory Visit: Payer: Self-pay

## 2024-07-01 DIAGNOSIS — E876 Hypokalemia: Secondary | ICD-10-CM

## 2024-07-01 DIAGNOSIS — G8250 Quadriplegia, unspecified: Secondary | ICD-10-CM

## 2024-07-01 MED ORDER — POTASSIUM CHLORIDE ER 10 MEQ PO TBCR
10.0000 meq | EXTENDED_RELEASE_TABLET | Freq: Three times a day (TID) | ORAL | 1 refills | Status: DC
  Filled 2024-07-01: qty 270, 90d supply, fill #0
  Filled 2024-09-29: qty 270, 90d supply, fill #1

## 2024-07-01 MED ORDER — WARFARIN SODIUM 2 MG PO TABS
ORAL_TABLET | ORAL | 1 refills | Status: AC
  Filled 2024-07-01: qty 75, 100d supply, fill #0
  Filled 2024-12-26: qty 75, 100d supply, fill #1

## 2024-07-01 MED ORDER — WARFARIN SODIUM 3 MG PO TABS
3.0000 mg | ORAL_TABLET | Freq: Every day | ORAL | 1 refills | Status: AC
  Filled 2024-07-01: qty 90, 90d supply, fill #0

## 2024-07-01 NOTE — Telephone Encounter (Signed)
 Copied from CRM #[id number]. Topic: Clinical - Medication Refill >> Jul 01, 2024  2:33 PM Antwanette L wrote: Medication: warfarin (COUMADIN ) 3 MG tablet  Has the patient contacted their pharmacy? Yes   This is the patient's preferred pharmacy:  [facility] - [facility] [address] [place] [address] Phone: [phone] Fax: [phone]  Is this the correct pharmacy for this prescription? Yes I  Has the prescription been filled recently? Yes. Last refilled on 04/14/24  Is the patient out of the medication? No  Has the patient been seen for an appointment in the last year OR does the patient have an upcoming appointment? Yes. Last ov was on 04/14/24  Can we respond through MyChart? No. Contact the patient at [phone])  Agent: Please be advised that Rx refills may take up to 3 business days. We ask that you follow-up with your pharmacy.

## 2024-07-02 ENCOUNTER — Other Ambulatory Visit: Payer: Self-pay

## 2024-07-04 DIAGNOSIS — R339 Retention of urine, unspecified: Secondary | ICD-10-CM | POA: Diagnosis not present

## 2024-07-07 DIAGNOSIS — R339 Retention of urine, unspecified: Secondary | ICD-10-CM | POA: Diagnosis not present

## 2024-07-08 DIAGNOSIS — L89624 Pressure ulcer of left heel, stage 4: Secondary | ICD-10-CM | POA: Diagnosis not present

## 2024-07-08 DIAGNOSIS — M21542 Acquired clubfoot, left foot: Secondary | ICD-10-CM | POA: Diagnosis not present

## 2024-07-08 DIAGNOSIS — Z79899 Other long term (current) drug therapy: Secondary | ICD-10-CM | POA: Diagnosis not present

## 2024-07-08 DIAGNOSIS — G8250 Quadriplegia, unspecified: Secondary | ICD-10-CM | POA: Diagnosis not present

## 2024-07-08 DIAGNOSIS — L98496 Non-pressure chronic ulcer of skin of other sites with bone involvement without evidence of necrosis: Secondary | ICD-10-CM | POA: Diagnosis not present

## 2024-07-08 DIAGNOSIS — Z87891 Personal history of nicotine dependence: Secondary | ICD-10-CM | POA: Diagnosis not present

## 2024-07-08 DIAGNOSIS — S81002A Unspecified open wound, left knee, initial encounter: Secondary | ICD-10-CM | POA: Diagnosis not present

## 2024-07-18 DIAGNOSIS — G8250 Quadriplegia, unspecified: Secondary | ICD-10-CM | POA: Diagnosis not present

## 2024-07-18 DIAGNOSIS — G894 Chronic pain syndrome: Secondary | ICD-10-CM | POA: Diagnosis not present

## 2024-07-22 ENCOUNTER — Other Ambulatory Visit: Payer: Self-pay

## 2024-07-22 DIAGNOSIS — L98496 Non-pressure chronic ulcer of skin of other sites with bone involvement without evidence of necrosis: Secondary | ICD-10-CM | POA: Diagnosis not present

## 2024-07-22 DIAGNOSIS — L89892 Pressure ulcer of other site, stage 2: Secondary | ICD-10-CM | POA: Diagnosis not present

## 2024-08-18 DIAGNOSIS — R339 Retention of urine, unspecified: Secondary | ICD-10-CM | POA: Diagnosis not present

## 2024-08-25 ENCOUNTER — Other Ambulatory Visit (HOSPITAL_COMMUNITY): Payer: Self-pay

## 2024-08-25 ENCOUNTER — Telehealth: Payer: Self-pay

## 2024-08-25 ENCOUNTER — Other Ambulatory Visit: Payer: Self-pay

## 2024-08-25 ENCOUNTER — Other Ambulatory Visit: Payer: Self-pay | Admitting: Family Medicine

## 2024-08-25 ENCOUNTER — Ambulatory Visit: Payer: Self-pay

## 2024-08-25 MED ORDER — PRAVASTATIN SODIUM 40 MG PO TABS
40.0000 mg | ORAL_TABLET | Freq: Every day | ORAL | 3 refills | Status: AC
  Filled 2024-08-25: qty 100, 100d supply, fill #0
  Filled 2024-12-12: qty 90, 90d supply, fill #1

## 2024-08-25 NOTE — Telephone Encounter (Signed)
 That's fine

## 2024-08-25 NOTE — Telephone Encounter (Signed)
 Pt calling in to see if she can come by tomorrow to drop off a urine sample. Pt states that she has a severe UTI. I offer the opening you have available tomorrow but she has another appt at that time.   Please advise.

## 2024-08-25 NOTE — Telephone Encounter (Signed)
 FYI Only or Action Required?: Action required by provider: requesting to drop off urine sample without AV.  Patient was last seen in primary care on 04/14/2024 by Chandra Toribio POUR, MD.  Called Nurse Triage reporting Urinary Tract Infection.  Symptoms began chronic issue.  Interventions attempted: Nothing.  Symptoms are: gradually worsening.  Triage Disposition: See Physician Within 24 Hours  Patient/caregiver understands and will follow disposition?: No, wishes to speak with PCP        Reason for Disposition  Bad or foul-smelling urine  Answer Assessment - Initial Assessment Questions 1. SYMPTOM: What's the main symptom you're concerned about? (e.g., frequency, incontinence)     I know I have a UTI 2. ONSET: When did the  sx  start?     Unable to answer - I'm a quad and I stay with UTI's 3. PAIN: Is there any pain? If Yes, ask: How bad is it? (Scale: 1-10; mild, moderate, severe)     N/a 4. CAUSE: What do you think is causing the symptoms?     UTI 5. OTHER SYMPTOMS: Do you have any other symptoms? (e.g., blood in urine, fever, flank pain, pain with urination)     Blood in urine, foul smell Endorses leaking from suprapubic cath and pt states that it indicates infection. 6. PREGNANCY: Is there any chance you are pregnant? When was your last menstrual period?     N/a    Triager offered AV with PCP, but pt declined. Pt requesting to drop off urine sample at office. Triager attempted to call PCP CAL, but unsuccessful.  Triager will forward encounter for Dr Chandra 's office to review and advise. Patient verbalized understanding and is expecting call back from office for next steps, if PCP able to accommodate request.  Protocols used: Urinary Symptoms-A-AH

## 2024-08-26 ENCOUNTER — Telehealth: Payer: Self-pay

## 2024-08-26 ENCOUNTER — Other Ambulatory Visit: Payer: Self-pay

## 2024-08-26 ENCOUNTER — Other Ambulatory Visit: Payer: Self-pay | Admitting: Family Medicine

## 2024-08-26 DIAGNOSIS — R8290 Unspecified abnormal findings in urine: Secondary | ICD-10-CM | POA: Diagnosis not present

## 2024-08-26 DIAGNOSIS — Z7901 Long term (current) use of anticoagulants: Secondary | ICD-10-CM | POA: Diagnosis not present

## 2024-08-26 DIAGNOSIS — L98496 Non-pressure chronic ulcer of skin of other sites with bone involvement without evidence of necrosis: Secondary | ICD-10-CM | POA: Diagnosis not present

## 2024-08-26 DIAGNOSIS — B379 Candidiasis, unspecified: Secondary | ICD-10-CM | POA: Diagnosis not present

## 2024-08-26 DIAGNOSIS — N390 Urinary tract infection, site not specified: Secondary | ICD-10-CM | POA: Diagnosis not present

## 2024-08-26 NOTE — Telephone Encounter (Signed)
 Pt changed her catheter bag this morning and once she has enough urine she will bring the sample.

## 2024-08-26 NOTE — Telephone Encounter (Signed)
 Copied from CRM [id number]. Topic: Clinical - Lab/Test Results >> Aug 26, 2024  4:33 PM Nathanel BROCKS wrote: Reason for CRM: pt is calling about the urine culture that was dropped off this morning. She is wanting to know if the results are back yet and if not when will they be.   Called pt she is advised that the results are not in as of yet culture was done today

## 2024-08-28 LAB — URINE CULTURE

## 2024-08-28 LAB — SPECIMEN STATUS REPORT

## 2024-08-29 ENCOUNTER — Ambulatory Visit: Payer: Self-pay | Admitting: Family Medicine

## 2024-08-29 ENCOUNTER — Other Ambulatory Visit: Payer: Self-pay | Admitting: Family Medicine

## 2024-08-29 ENCOUNTER — Other Ambulatory Visit (HOSPITAL_COMMUNITY): Payer: Self-pay

## 2024-08-29 ENCOUNTER — Other Ambulatory Visit: Payer: Self-pay

## 2024-08-29 ENCOUNTER — Telehealth: Payer: Self-pay

## 2024-08-29 DIAGNOSIS — B379 Candidiasis, unspecified: Secondary | ICD-10-CM

## 2024-08-29 MED ORDER — FLUCONAZOLE 200 MG PO TABS
200.0000 mg | ORAL_TABLET | Freq: Every day | ORAL | 0 refills | Status: AC
  Filled 2024-08-29: qty 3, 3d supply, fill #0

## 2024-08-29 MED ORDER — CEPHALEXIN 500 MG PO CAPS
500.0000 mg | ORAL_CAPSULE | Freq: Three times a day (TID) | ORAL | 0 refills | Status: AC
  Filled 2024-08-29 (×2): qty 21, 7d supply, fill #0

## 2024-08-29 NOTE — Progress Notes (Signed)
 Called patient she is advised of her lab results and recommendation also pt would like for you to call her she want to talk about her infection

## 2024-08-29 NOTE — Telephone Encounter (Signed)
 Copied from CRM [id number]. Topic: Clinical - Lab/Test Results >> Aug 28, 2024  5:15 PM Debby BROCKS wrote: Reason for CRM: Patient would like to know the results of the specimen she turned in on Tuesday of this week

## 2024-08-29 NOTE — Progress Notes (Signed)
 Called pt.  She states she has a yeast infection. she Spoke with Dr. Trudy on the 23rd about it.  She is requesting 3 days of diflucan . She was aware of its effects on warfarin and we discussed holding the warfarin temporarily while she takes it.  She is currently taking an antibiotic for a UTI and will start the diflucan  after finishing the abx.

## 2024-09-03 ENCOUNTER — Other Ambulatory Visit: Payer: Self-pay | Admitting: Family Medicine

## 2024-09-03 DIAGNOSIS — N319 Neuromuscular dysfunction of bladder, unspecified: Secondary | ICD-10-CM

## 2024-09-03 MED ORDER — OXYBUTYNIN CHLORIDE 5 MG PO TABS
5.0000 mg | ORAL_TABLET | Freq: Two times a day (BID) | ORAL | 1 refills | Status: AC
  Filled 2024-09-03: qty 200, 100d supply, fill #0
  Filled 2024-12-12: qty 180, 90d supply, fill #1

## 2024-09-04 ENCOUNTER — Other Ambulatory Visit: Payer: Self-pay

## 2024-09-04 ENCOUNTER — Other Ambulatory Visit (HOSPITAL_COMMUNITY): Payer: Self-pay

## 2024-09-08 ENCOUNTER — Other Ambulatory Visit: Payer: Self-pay

## 2024-09-08 ENCOUNTER — Other Ambulatory Visit (HOSPITAL_COMMUNITY): Payer: Self-pay

## 2024-09-08 ENCOUNTER — Ambulatory Visit: Payer: Self-pay

## 2024-09-08 DIAGNOSIS — B379 Candidiasis, unspecified: Secondary | ICD-10-CM

## 2024-09-08 DIAGNOSIS — R300 Dysuria: Secondary | ICD-10-CM

## 2024-09-08 LAB — POCT URINALYSIS DIP (CLINITEK)
Bilirubin, UA: NEGATIVE
Glucose, UA: NEGATIVE mg/dL
Ketones, POC UA: NEGATIVE mg/dL
Nitrite, UA: POSITIVE — AB
POC PROTEIN,UA: 30 — AB
Spec Grav, UA: 1.015 (ref 1.010–1.025)
Urobilinogen, UA: 0.2 U/dL
pH, UA: 7.5 (ref 5.0–8.0)

## 2024-09-08 MED ORDER — NITROFURANTOIN MONOHYD MACRO 100 MG PO CAPS
100.0000 mg | ORAL_CAPSULE | Freq: Two times a day (BID) | ORAL | 0 refills | Status: AC
  Filled 2024-09-08: qty 10, 5d supply, fill #0

## 2024-09-10 ENCOUNTER — Telehealth: Payer: Self-pay

## 2024-09-10 NOTE — Telephone Encounter (Signed)
 Copied from CRM #[id number]. Topic: Clinical - Lab/Test Results >> Sep 10, 2024  9:16 AM Revonda D wrote: Reason for CRM: Pt is requesting to speak Dr.Olson's nurse in regards to her urine culture results. Pt stated that she hasn't received an update and wants to make sure it was sent to the labs because she has an issue with this last time and was also sent the incorrect antibiotics. Pt would like a callback today if possible.  Called patient she is advised that her results has not come back as of yet

## 2024-09-11 ENCOUNTER — Other Ambulatory Visit: Payer: Self-pay | Admitting: *Deleted

## 2024-09-11 LAB — URINE CULTURE

## 2024-09-11 LAB — SPECIMEN STATUS REPORT

## 2024-09-11 NOTE — Telephone Encounter (Signed)
 Opened in error

## 2024-09-12 ENCOUNTER — Other Ambulatory Visit: Payer: Self-pay

## 2024-09-12 MED ORDER — CIPROFLOXACIN HCL 500 MG PO TABS: 500.0000 mg | ORAL_TABLET | Freq: Two times a day (BID) | ORAL | 0 refills | Status: DC

## 2024-09-13 ENCOUNTER — Ambulatory Visit: Payer: Self-pay

## 2024-09-13 NOTE — Progress Notes (Signed)
 Second urine culture in the last 10 days showed the same result as previous: Greater than 2 organisms recovered, none predominant. Patient has a permanent suprapubic catheter due to history of neurogenic bladder.  She follows with Dr. Tanda Moats, urologist at [facility]. Patient came into office on Friday, 09/12/2024, to discuss the results. Patient frustrated with urine culture coming back inconclusive for second time. We did discuss that since she has an indwelling suprapubic catheter that her urine is always going to be colonized with bacteria.  However, given concerning UA and the fact that patient is reporting visible blood as well as pyuria in the tubing of her catheter, have sent in ciprofloxacin  for her as she has tolerated this in the past. Denies fever, chills, other systemic symptoms. Will also route this to her urologist as an RICK and recs for further management.

## 2024-09-15 ENCOUNTER — Telehealth: Payer: Self-pay

## 2024-09-15 NOTE — Telephone Encounter (Signed)
 Copied from CRM [id number]. Topic: Clinical - Lab/Test Results >> Sep 15, 2024  3:57 PM [name] wrote: Reason for CRM: Pt called to report her INR was 1.7 after taking diflucan . Pls call pt    Patient wanted to  inform you about her reading

## 2024-09-18 ENCOUNTER — Other Ambulatory Visit: Payer: Self-pay

## 2024-09-18 ENCOUNTER — Other Ambulatory Visit (HOSPITAL_COMMUNITY): Payer: Self-pay

## 2024-09-18 ENCOUNTER — Other Ambulatory Visit: Payer: Self-pay | Admitting: Family Medicine

## 2024-09-18 DIAGNOSIS — E038 Other specified hypothyroidism: Secondary | ICD-10-CM

## 2024-09-18 MED ORDER — LEVOTHYROXINE SODIUM 50 MCG PO TABS
50.0000 ug | ORAL_TABLET | Freq: Every day | ORAL | 2 refills | Status: AC
  Filled 2024-09-18: qty 100, 100d supply, fill #0
  Filled 2024-12-26: qty 90, 90d supply, fill #1

## 2024-09-23 ENCOUNTER — Other Ambulatory Visit: Payer: Self-pay

## 2024-09-23 ENCOUNTER — Other Ambulatory Visit (HOSPITAL_COMMUNITY): Payer: Self-pay

## 2024-09-23 DIAGNOSIS — G8220 Paraplegia, unspecified: Secondary | ICD-10-CM | POA: Diagnosis not present

## 2024-09-23 DIAGNOSIS — S81002A Unspecified open wound, left knee, initial encounter: Secondary | ICD-10-CM | POA: Diagnosis not present

## 2024-09-23 DIAGNOSIS — L89623 Pressure ulcer of left heel, stage 3: Secondary | ICD-10-CM | POA: Diagnosis not present

## 2024-09-23 DIAGNOSIS — L89616 Pressure-induced deep tissue damage of right heel: Secondary | ICD-10-CM | POA: Diagnosis not present

## 2024-09-23 DIAGNOSIS — L89892 Pressure ulcer of other site, stage 2: Secondary | ICD-10-CM | POA: Diagnosis not present

## 2024-09-23 MED ORDER — SILVER SULFADIAZINE 1 % EX CREA
TOPICAL_CREAM | CUTANEOUS | 0 refills | Status: AC
  Filled 2024-09-23: qty 400, 30d supply, fill #0

## 2024-09-29 ENCOUNTER — Encounter: Payer: Self-pay | Admitting: Pharmacist

## 2024-09-29 ENCOUNTER — Other Ambulatory Visit: Payer: Self-pay

## 2024-09-29 ENCOUNTER — Other Ambulatory Visit: Payer: Self-pay | Admitting: Family Medicine

## 2024-09-29 ENCOUNTER — Other Ambulatory Visit (HOSPITAL_BASED_OUTPATIENT_CLINIC_OR_DEPARTMENT_OTHER): Payer: Self-pay

## 2024-09-29 ENCOUNTER — Other Ambulatory Visit (HOSPITAL_COMMUNITY): Payer: Self-pay

## 2024-09-29 DIAGNOSIS — N319 Neuromuscular dysfunction of bladder, unspecified: Secondary | ICD-10-CM

## 2024-09-29 MED ORDER — NEOMYCIN-POLYMYXIN B GU 40-200000 IR SOLN
5 refills | Status: AC | PRN
  Filled 2024-09-29: qty 20, 30d supply, fill #0

## 2024-09-29 MED ORDER — BACLOFEN 20 MG PO TABS
20.0000 mg | ORAL_TABLET | Freq: Two times a day (BID) | ORAL | 1 refills | Status: AC
  Filled 2024-09-29: qty 180, 90d supply, fill #0
  Filled 2024-12-26: qty 180, 90d supply, fill #1

## 2024-09-30 ENCOUNTER — Other Ambulatory Visit: Payer: Self-pay

## 2024-10-01 ENCOUNTER — Other Ambulatory Visit (HOSPITAL_COMMUNITY): Payer: Self-pay

## 2024-10-01 ENCOUNTER — Other Ambulatory Visit: Payer: Self-pay

## 2024-10-02 ENCOUNTER — Other Ambulatory Visit (HOSPITAL_COMMUNITY): Payer: Self-pay

## 2024-10-03 ENCOUNTER — Other Ambulatory Visit (HOSPITAL_COMMUNITY): Payer: Self-pay

## 2024-10-03 DIAGNOSIS — R339 Retention of urine, unspecified: Secondary | ICD-10-CM | POA: Diagnosis not present

## 2024-10-07 DIAGNOSIS — R339 Retention of urine, unspecified: Secondary | ICD-10-CM | POA: Diagnosis not present

## 2024-10-08 ENCOUNTER — Other Ambulatory Visit: Payer: Self-pay

## 2024-10-08 DIAGNOSIS — R339 Retention of urine, unspecified: Secondary | ICD-10-CM | POA: Diagnosis not present

## 2024-10-13 ENCOUNTER — Ambulatory Visit: Admitting: Family Medicine

## 2024-10-20 ENCOUNTER — Other Ambulatory Visit: Payer: Self-pay

## 2024-10-21 DIAGNOSIS — L89892 Pressure ulcer of other site, stage 2: Secondary | ICD-10-CM | POA: Diagnosis not present

## 2024-10-21 DIAGNOSIS — S90812D Abrasion, left foot, subsequent encounter: Secondary | ICD-10-CM | POA: Diagnosis not present

## 2024-10-21 DIAGNOSIS — L89624 Pressure ulcer of left heel, stage 4: Secondary | ICD-10-CM | POA: Diagnosis not present

## 2024-10-21 DIAGNOSIS — S81002D Unspecified open wound, left knee, subsequent encounter: Secondary | ICD-10-CM | POA: Diagnosis not present

## 2024-10-21 DIAGNOSIS — L89616 Pressure-induced deep tissue damage of right heel: Secondary | ICD-10-CM | POA: Diagnosis not present

## 2024-10-21 DIAGNOSIS — L89613 Pressure ulcer of right heel, stage 3: Secondary | ICD-10-CM | POA: Diagnosis not present

## 2024-10-22 DIAGNOSIS — S81002A Unspecified open wound, left knee, initial encounter: Secondary | ICD-10-CM | POA: Diagnosis not present

## 2024-10-23 DIAGNOSIS — N261 Atrophy of kidney (terminal): Secondary | ICD-10-CM | POA: Diagnosis not present

## 2024-10-23 DIAGNOSIS — N319 Neuromuscular dysfunction of bladder, unspecified: Secondary | ICD-10-CM | POA: Diagnosis not present

## 2024-10-23 DIAGNOSIS — R339 Retention of urine, unspecified: Secondary | ICD-10-CM | POA: Diagnosis not present

## 2024-10-23 DIAGNOSIS — N200 Calculus of kidney: Secondary | ICD-10-CM | POA: Diagnosis not present

## 2024-10-27 ENCOUNTER — Telehealth: Payer: Self-pay

## 2024-10-27 NOTE — Telephone Encounter (Signed)
 Surgical Clearance was received and placed in provider box.

## 2024-10-28 ENCOUNTER — Other Ambulatory Visit: Payer: Self-pay | Admitting: Family Medicine

## 2024-10-28 DIAGNOSIS — G8250 Quadriplegia, unspecified: Secondary | ICD-10-CM

## 2024-10-28 NOTE — Telephone Encounter (Signed)
 Gastroenterology asking for anticoagulant recommendations prior to colonoscopy.  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  I do not see any mention of MI, CVA, or Afib in her chart.  Chadsvasc score is 2.  Per 2017 ACC guidelines, VKA should be held 5 days prior to procedure, no bridging necessary, and should be restarted after the procedure when hemostasis is achieved.

## 2024-10-29 ENCOUNTER — Other Ambulatory Visit: Payer: Self-pay

## 2024-10-29 ENCOUNTER — Other Ambulatory Visit (HOSPITAL_BASED_OUTPATIENT_CLINIC_OR_DEPARTMENT_OTHER): Payer: Self-pay

## 2024-10-29 MED ORDER — DIAZEPAM 5 MG PO TABS
5.0000 mg | ORAL_TABLET | Freq: Two times a day (BID) | ORAL | 3 refills | Status: AC | PRN
  Filled 2024-10-29: qty 60, 30d supply, fill #0
  Filled 2024-12-26 – 2024-12-29 (×2): qty 60, 30d supply, fill #1

## 2024-10-29 MED ORDER — BUMETANIDE 1 MG PO TABS
1.0000 mg | ORAL_TABLET | Freq: Every day | ORAL | 1 refills | Status: AC
  Filled 2024-10-29: qty 100, 100d supply, fill #0

## 2024-11-06 ENCOUNTER — Other Ambulatory Visit: Payer: Self-pay

## 2024-11-10 ENCOUNTER — Ambulatory Visit: Admitting: Family Medicine

## 2024-11-10 ENCOUNTER — Ambulatory Visit: Payer: Self-pay | Admitting: *Deleted

## 2024-11-10 NOTE — Telephone Encounter (Signed)
 Patient requesting call back ASAP if urine sample can be collected and Urine culture be ordered. Patient sister will bring sample. Requesting recheck of INR. Patient requesting call back. Rescheduled f/u appt for 11/12/24.     FYI Only or Action Required?: FYI only for provider: recommended patient be evaluated with in 4 hours. Patient requested appt today be canceled due to no one available to assist with clean up prior to appt hx tetraplegia and patient requesting urine culture and rechecking INR.  Patient was last seen in primary care on 04/14/2024 by Chandra Toribio POUR, MD.  Called Nurse Triage reporting Urinary Retention.  Symptoms began several days ago.  Interventions attempted: OTC medications: tylenol  ES, changed supra pubic catheter last night. .  Symptoms are: gradually worsening.  Triage Disposition: See HCP Within 4 Hours (Or PCP Triage)  Patient/caregiver understands and will follow disposition?: No, wishes to speak with PCP      Attempted to contact CAL due to patient refused to be seen with in 4 hours today and canceled appt this am.             Copied from CRM [id number]. Topic: Clinical - Red Word Triage >> Nov 10, 2024  8:40 AM Lonell PEDLAR wrote: Red Word that prompted transfer to Nurse Triage: Patient having issues with her catheter, infected. Having to change catheter weekly. Reason for Disposition  [1] Pink, slightly red, or tea-colored urine lasts > 24 hours AND [2] not cleared by increased fluid intake AND [3] taking Coumadin  (warfarin) or other strong blood thinner, or known bleeding disorder (e.g., thrombocytopenia)  Answer Assessment - Initial Assessment Questions Recommended evaluation with in 4 hours. Patient calling to cancel appt today due to she does not have assist to clean up or get ready for appt today. Appt canceled per patient request. Rescheduled for 11/12/24. Patient requesting order for urine culture ASAP. Supra pubic catheter changed last night  and flowing urine , cloudy and bloody. Required flushing catheter  due to catheter getting clogged.  Also reports INR down to 1.3. please advise .  appt canceled today and patient requesting urine culture  Attempted to contact CAL and unsuccessful patient requesting a call back.   1. MAIN CONCERN OR SYMPTOM:  What is your main concern right now? (e.g., blood in urine, urine blocked, can't cath).     Urine with sediment, clogging tubing, requires flushing, blood in urine 2. ONSET: When did the  sx   start?     Saturday  worsening and months ago  3. BLADDER MANAGEMENT PROGRAM: What is your bladder management program?     Suprapubic cathter care 4. FEVER: Do you have a fever? If Yes, ask: What is the temperature, how was it measured, and when did it start?     Na 5. URINE COLOR: What color is the urine?  Is there blood present in the urine? (e.g., clear, yellow, cloudy, tea-colored, blood streaks, bright red)     Cloudy , blood in urine 6. ONSET: When was the catheter inserted (last changed)?     Last night 7. OTHER SYMPTOMS: Do you have any other symptoms? (e.g., fever, back pain, bad urine odor, headache, sweating, goosebumps)     No fever, feels weak, no headache, cloudy urine, blood in urine. Supra pubic catheter changed last night flowing urine now  8. SPINAL CORD INJURY: What is your spinal injury level? (e.g., C1-7, T-12)     Chest down 5-6 can movement arms and fingers.  Protocols used:  Spinal Cord Injury - Urinary Catheter Symptoms and Questions-A-AH

## 2024-11-11 ENCOUNTER — Other Ambulatory Visit: Payer: Self-pay | Admitting: Family Medicine

## 2024-11-11 DIAGNOSIS — R339 Retention of urine, unspecified: Secondary | ICD-10-CM | POA: Diagnosis not present

## 2024-11-12 ENCOUNTER — Encounter: Payer: Self-pay | Admitting: Family Medicine

## 2024-11-12 ENCOUNTER — Ambulatory Visit: Admitting: Family Medicine

## 2024-11-12 VITALS — BP 93/60 | HR 52 | Ht 64.0 in

## 2024-11-12 DIAGNOSIS — R791 Abnormal coagulation profile: Secondary | ICD-10-CM | POA: Diagnosis not present

## 2024-11-12 DIAGNOSIS — Z23 Encounter for immunization: Secondary | ICD-10-CM

## 2024-11-12 DIAGNOSIS — Z0000 Encounter for general adult medical examination without abnormal findings: Secondary | ICD-10-CM | POA: Diagnosis not present

## 2024-11-12 DIAGNOSIS — N390 Urinary tract infection, site not specified: Secondary | ICD-10-CM | POA: Diagnosis not present

## 2024-11-12 NOTE — Progress Notes (Signed)
 Established Patient Office Visit  Subjective   Patient ID: [name], female    DOB: 06-Mar-1956  Age: 68 y.o. MRN: [id number]  Chief Complaint  Patient presents with   Medical Management of Chronic Issues    HPI  Subjective - reports concern of Recurrent UTIs with suprapubic catheter. Reports catheter is getting clogged frequently, requiring changes almost weekly. She notes her urine to be very red or has visible sediment. Reports malodorous urine which has improved with Cipro . Patient is not febrile and does not feel symptoms of infection. Concerned about developing antibiotic resistance. States a urine culture sent by Dr. Nicholaus a month ago was pending. Another recent sample was collected the morning after a catheter change, and the urine appeared clearer than usual, which may affect culture results. A previous culture showed multiple species with insufficient growth for speciation. - Warfarin management. Reports recent INR of 1.3, which is unusually low. States INR has been variable, ranging from 1.9 to over 3. States home machine is old and questions its accuracy. Left machine at home today but was planning to bring it for comparison with a lab draw. - Requests annual physical and labs. Believes they are due for annual blood work. Has been fasting today in preparation. Prefers physicals in the fall (October) to avoid winter weather. - Immunizations. Inquires about flu shot, tetanus, pneumonia, and shingles vaccines. Notes needing boosters for pneumonia and shingles, and a tetanus shot.  Medications: Warfarin, dosing is 3 mg on Monday, Wednesday, Friday and 2 mg on other days. Fexofenadine  (Allegra ). Takes extra strength Tylenol  as needed for back pain, which is a recent change.   PMH, PSH, FH, Social Hx: PMHx: Recurrent UTIs, chronic indwelling catheter, kidney stone (stable), history of bone infection (osteomyelitis) treated with 6 weeks of IV and oral antibiotics.   Social Hx: Does  not like driving in [place], [place], or [place]. Requires family member to drive for appointments.  ROS: GU: Reports malodorous, discolored (red), and cloudy urine with sediment. No fever. MSK: Reports back pain with overexertion.   The 10-year ASCVD risk score (Arnett DK, et al., 2019) is: 4.7%  Health Maintenance Due  Topic Date Due   Zoster Vaccines- Shingrix (1 of 2) 02/11/1974   Mammogram  Never done   Bone Density Scan  Never done   COVID-19 Vaccine (3 - Pfizer risk series) 05/24/2020   DTaP/Tdap/Td (2 - Td or Tdap) 12/04/2020   Medicare Annual Wellness (AWV)  11/12/2024      Objective:     BP 93/60   Pulse (!) 52   Ht 5' 4 (1.626 m)   SpO2 98%   BMI 30.04 kg/m    Physical Exam Gen: alert, oriented Pulm: no respiratory distress Msk: quadriplegia Psych: pleasant affect    No results found for any visits on 11/12/24.      Assessment & Plan:   Complicated UTI (urinary tract infection) Assessment & Plan: - Reports recurrent UTIs with symptomatic catheter blockage, malodorous and discolored urine. No fever. Urology (Dr. Nicholaus) is hesitant to treat with antibiotics without systemic symptoms due to concern for resistance. Prior culture showed mixed flora. A new culture is pending. - Await results of recent urine culture before making changes to antibiotic therapy. - Urology follow-up in 6 months for repeat ultrasound and cystoscopy is planned per Dr. Nicholaus.   Abnormal INR Assessment & Plan: - Reports a recent home INR of 1.3 on a regimen of warfarin 3 mg Mon/Wed/Fri and 2 mg  on other days. Also reports increased use of Tylenol  for back pain. Home machine accuracy is questioned. - Stop taking Tylenol  to see if it affects INR. - Schedule a lab visit for an INR check as soon as possible. Pt will compare her readings at home to our results - Will consider adding on other labs (thyroid , cholesterol, liver enzymes) at the time of the lab draw.  Orders: -      Protime-INR; Future  Healthcare maintenance Assessment & Plan: - Patient is due for annual physical and immunizations. - Flu shot administered today in the leg as requested. - Postpone annual physical and labs until a morning appointment can be scheduled, possibly in the spring or next October per patient preference. - Needs tetanus, shingles, pneumococcal vaccinations, which can be obtained at a pharmacy.   Other orders -     Flu vaccine HIGH DOSE PF(Fluzone Trivalent)   I spent 33 min in the mgmt of this pt.   No follow-ups on file.    Toribio MARLA Slain, MD

## 2024-11-12 NOTE — Patient Instructions (Signed)
 It was nice to see you today,  We addressed the following topics today: - We will wait for the urine culture results to come back, which should be tomorrow, before deciding on any antibiotic treatment. - Please schedule an appointment for a blood draw to check your INR as soon as you can. Make sure it is a morning appointment. You can bring your home INR readings to that visit. - For now, try to stop taking Tylenol  and we can see if that helps with your INR levels. - We have given you the flu shot today. - You can get your tetanus, pneumonia, and shingles shots at a pharmacy. - Please schedule your full physical exam for a morning appointment, either in the spring or next fall, whichever works for you. Let the scheduler know you need a morning appointment for labs when you call.  Have a great day,  Rolan Slain, MD

## 2024-11-13 ENCOUNTER — Telehealth: Payer: Self-pay

## 2024-11-13 NOTE — Telephone Encounter (Signed)
 Copied from CRM #[id number]. Topic: Clinical - Lab/Test Results >> Nov 13, 2024  4:39 PM Selinda RAMAN wrote: Reason for CRM: The patient called in for her urine culture results as they are not back on her my chart yet and she would like to speak with someone and know the results before the weekend. Please assist patient further.

## 2024-11-14 LAB — URINE CULTURE

## 2024-11-15 ENCOUNTER — Ambulatory Visit: Payer: Self-pay | Admitting: Family Medicine

## 2024-11-15 DIAGNOSIS — R791 Abnormal coagulation profile: Secondary | ICD-10-CM | POA: Insufficient documentation

## 2024-11-15 MED ORDER — CEPHALEXIN 500 MG PO CAPS: 500.0000 mg | ORAL_CAPSULE | Freq: Three times a day (TID) | ORAL | 0 refills | Status: AC

## 2024-11-15 NOTE — Assessment & Plan Note (Signed)
-   Reports a recent home INR of 1.3 on a regimen of warfarin 3 mg Mon/Wed/Fri and 2 mg on other days. Also reports increased use of Tylenol  for back pain. Home machine accuracy is questioned. - Stop taking Tylenol  to see if it affects INR. - Schedule a lab visit for an INR check as soon as possible. Pt will compare her readings at home to our results - Will consider adding on other labs (thyroid , cholesterol, liver enzymes) at the time of the lab draw.

## 2024-11-15 NOTE — Assessment & Plan Note (Signed)
-   Reports recurrent UTIs with symptomatic catheter blockage, malodorous and discolored urine. No fever. Urology (Dr. Nicholaus) is hesitant to treat with antibiotics without systemic symptoms due to concern for resistance. Prior culture showed mixed flora. A new culture is pending. - Await results of recent urine culture before making changes to antibiotic therapy. - Urology follow-up in 6 months for repeat ultrasound and cystoscopy is planned per Dr. Nicholaus.

## 2024-11-15 NOTE — Assessment & Plan Note (Signed)
-   Patient is due for annual physical and immunizations. - Flu shot administered today in the leg as requested. - Postpone annual physical and labs until a morning appointment can be scheduled, possibly in the spring or next October per patient preference. - Needs tetanus, shingles, pneumococcal vaccinations, which can be obtained at a pharmacy.

## 2024-11-17 NOTE — Progress Notes (Signed)
 Called patient she is advised of her medication she stated that her INR is 1.3 and she wants to know if there was more than one bacteria that was found in her culture

## 2024-11-18 NOTE — Progress Notes (Signed)
 Called patient she stated that her INR has been the same since Monday at 1.3 she stated that she will have to speak to someone at home first before she make an appointment

## 2024-11-18 NOTE — Telephone Encounter (Signed)
 Just one bacteria was found.  Please ask her if her INR has been consistently low or if it is still fluctuating between low and high.  Also make sure she has made the appointment to get her INR done here so she can check it against her home machine.

## 2024-11-18 NOTE — Progress Notes (Signed)
 Called patient she stated that it does not make sense because you suppose to mix it up and also concern about taking the abx with her medication

## 2024-11-18 NOTE — Telephone Encounter (Signed)
 Can you advise her to increase the dose of her warfarin so that instead of taking 3mg  on MWF she takes 3mg  [id number] until her inr improves.

## 2024-12-09 ENCOUNTER — Other Ambulatory Visit (HOSPITAL_COMMUNITY): Payer: Self-pay

## 2024-12-12 ENCOUNTER — Other Ambulatory Visit (HOSPITAL_COMMUNITY): Payer: Self-pay

## 2024-12-15 ENCOUNTER — Other Ambulatory Visit (HOSPITAL_COMMUNITY): Payer: Self-pay

## 2024-12-26 ENCOUNTER — Other Ambulatory Visit: Payer: Self-pay

## 2024-12-26 DIAGNOSIS — E876 Hypokalemia: Secondary | ICD-10-CM

## 2024-12-27 MED ORDER — POTASSIUM CHLORIDE ER 10 MEQ PO TBCR
10.0000 meq | EXTENDED_RELEASE_TABLET | Freq: Three times a day (TID) | ORAL | 1 refills | Status: AC
  Filled 2024-12-27: qty 270, 90d supply, fill #0

## 2024-12-28 ENCOUNTER — Other Ambulatory Visit (HOSPITAL_COMMUNITY): Payer: Self-pay

## 2024-12-28 ENCOUNTER — Other Ambulatory Visit: Payer: Self-pay

## 2024-12-29 ENCOUNTER — Other Ambulatory Visit (HOSPITAL_COMMUNITY): Payer: Self-pay

## 2024-12-30 ENCOUNTER — Other Ambulatory Visit: Payer: Self-pay

## 2025-01-01 ENCOUNTER — Other Ambulatory Visit: Payer: Self-pay

## 2025-01-01 ENCOUNTER — Other Ambulatory Visit (HOSPITAL_BASED_OUTPATIENT_CLINIC_OR_DEPARTMENT_OTHER): Payer: Self-pay

## 2025-01-01 ENCOUNTER — Other Ambulatory Visit (HOSPITAL_COMMUNITY): Payer: Self-pay
